# Patient Record
Sex: Male | Born: 1935 | Race: White | Hispanic: No | Marital: Married | Smoking: Never smoker
Health system: Southern US, Community
[De-identification: ages and names within clinical notes are randomized; demographics above are authoritative.]

## PROBLEM LIST (undated history)

## (undated) DIAGNOSIS — R4701 Aphasia: Principal | ICD-10-CM

## (undated) DIAGNOSIS — Z8719 Personal history of other diseases of the digestive system: Secondary | ICD-10-CM

## (undated) DIAGNOSIS — H269 Unspecified cataract: Secondary | ICD-10-CM

## (undated) DIAGNOSIS — B351 Tinea unguium: Secondary | ICD-10-CM

## (undated) DIAGNOSIS — L719 Rosacea, unspecified: Secondary | ICD-10-CM

## (undated) DIAGNOSIS — K5790 Diverticulosis of intestine, part unspecified, without perforation or abscess without bleeding: Secondary | ICD-10-CM

## (undated) DIAGNOSIS — A4151 Sepsis due to Escherichia coli [E. coli]: Secondary | ICD-10-CM

## (undated) DIAGNOSIS — S0500XA Injury of conjunctiva and corneal abrasion without foreign body, unspecified eye, initial encounter: Secondary | ICD-10-CM

## (undated) DIAGNOSIS — T7840XA Allergy, unspecified, initial encounter: Secondary | ICD-10-CM

## (undated) DIAGNOSIS — G459 Transient cerebral ischemic attack, unspecified: Secondary | ICD-10-CM

## (undated) DIAGNOSIS — R040 Epistaxis: Secondary | ICD-10-CM

## (undated) DIAGNOSIS — N400 Enlarged prostate without lower urinary tract symptoms: Secondary | ICD-10-CM

## (undated) DIAGNOSIS — I10 Essential (primary) hypertension: Secondary | ICD-10-CM

## (undated) DIAGNOSIS — H409 Unspecified glaucoma: Secondary | ICD-10-CM

## (undated) DIAGNOSIS — E785 Hyperlipidemia, unspecified: Secondary | ICD-10-CM

## (undated) HISTORY — PX: TONSILLECTOMY AND ADENOIDECTOMY: SUR1326

## (undated) HISTORY — DX: Unspecified cataract: H26.9

## (undated) HISTORY — DX: Injury of conjunctiva and corneal abrasion without foreign body, unspecified eye, initial encounter: S05.00XA

## (undated) HISTORY — DX: Benign prostatic hyperplasia without lower urinary tract symptoms: N40.0

## (undated) HISTORY — DX: Rosacea, unspecified: L71.9

## (undated) HISTORY — DX: Essential (primary) hypertension: I10

## (undated) HISTORY — DX: Hyperlipidemia, unspecified: E78.5

## (undated) HISTORY — DX: Allergy, unspecified, initial encounter: T78.40XA

## (undated) HISTORY — DX: Unspecified glaucoma: H40.9

## (undated) HISTORY — DX: Diverticulosis of intestine, part unspecified, without perforation or abscess without bleeding: K57.90

## (undated) HISTORY — DX: Transient cerebral ischemic attack, unspecified: G45.9

## (undated) HISTORY — DX: Aphasia: R47.01

## (undated) HISTORY — PX: INGUINAL HERNIA REPAIR: SHX194

## (undated) HISTORY — PX: HERNIA REPAIR: SHX51

## (undated) HISTORY — DX: Personal history of other diseases of the digestive system: Z87.19

## (undated) HISTORY — PX: APPENDECTOMY: SHX54

## (undated) HISTORY — DX: Tinea unguium: B35.1

## (undated) HISTORY — DX: Sepsis due to Escherichia coli (e. coli): A41.51

---

## 1976-11-23 HISTORY — PX: CHOLECYSTECTOMY: SHX55

## 2001-09-04 ENCOUNTER — Emergency Department (HOSPITAL_COMMUNITY): Admission: EM | Admit: 2001-09-04 | Discharge: 2001-09-04 | Payer: Self-pay | Admitting: Emergency Medicine

## 2001-11-23 HISTORY — PX: CATARACT EXTRACTION, BILATERAL: SHX1313

## 2002-01-27 ENCOUNTER — Encounter: Payer: Self-pay | Admitting: Ophthalmology

## 2002-01-31 ENCOUNTER — Ambulatory Visit (HOSPITAL_COMMUNITY): Admission: RE | Admit: 2002-01-31 | Discharge: 2002-01-31 | Payer: Self-pay | Admitting: Ophthalmology

## 2002-04-11 ENCOUNTER — Ambulatory Visit (HOSPITAL_COMMUNITY): Admission: RE | Admit: 2002-04-11 | Discharge: 2002-04-11 | Payer: Self-pay | Admitting: Ophthalmology

## 2004-02-18 ENCOUNTER — Encounter: Payer: Self-pay | Admitting: Internal Medicine

## 2004-12-17 ENCOUNTER — Ambulatory Visit: Payer: Self-pay | Admitting: Internal Medicine

## 2004-12-22 ENCOUNTER — Ambulatory Visit: Payer: Self-pay | Admitting: Internal Medicine

## 2005-02-16 ENCOUNTER — Ambulatory Visit: Payer: Self-pay | Admitting: Internal Medicine

## 2005-02-20 ENCOUNTER — Ambulatory Visit: Payer: Self-pay | Admitting: Internal Medicine

## 2005-06-22 ENCOUNTER — Ambulatory Visit: Payer: Self-pay | Admitting: Internal Medicine

## 2005-07-31 ENCOUNTER — Ambulatory Visit: Payer: Self-pay | Admitting: Internal Medicine

## 2006-04-01 ENCOUNTER — Ambulatory Visit: Payer: Self-pay | Admitting: Internal Medicine

## 2006-04-09 ENCOUNTER — Ambulatory Visit: Payer: Self-pay | Admitting: Internal Medicine

## 2006-05-12 ENCOUNTER — Ambulatory Visit: Payer: Self-pay | Admitting: Internal Medicine

## 2006-06-14 ENCOUNTER — Ambulatory Visit: Payer: Self-pay | Admitting: Internal Medicine

## 2006-12-10 ENCOUNTER — Ambulatory Visit: Payer: Self-pay | Admitting: Internal Medicine

## 2007-04-26 ENCOUNTER — Telehealth (INDEPENDENT_AMBULATORY_CARE_PROVIDER_SITE_OTHER): Payer: Self-pay | Admitting: *Deleted

## 2007-05-09 ENCOUNTER — Ambulatory Visit: Payer: Self-pay | Admitting: Internal Medicine

## 2007-05-12 DIAGNOSIS — L719 Rosacea, unspecified: Secondary | ICD-10-CM

## 2007-05-12 DIAGNOSIS — H269 Unspecified cataract: Secondary | ICD-10-CM

## 2007-05-13 LAB — CONVERTED CEMR LAB
AST: 23 units/L (ref 0–37)
BUN: 11 mg/dL (ref 6–23)
Hgb A1c MFr Bld: 5.7 % (ref 4.6–6.0)
VLDL: 23 mg/dL (ref 0–40)

## 2007-05-16 ENCOUNTER — Ambulatory Visit: Payer: Self-pay | Admitting: Internal Medicine

## 2007-05-16 DIAGNOSIS — N400 Enlarged prostate without lower urinary tract symptoms: Secondary | ICD-10-CM

## 2007-09-01 ENCOUNTER — Ambulatory Visit: Payer: Self-pay | Admitting: Internal Medicine

## 2007-09-01 DIAGNOSIS — H902 Conductive hearing loss, unspecified: Secondary | ICD-10-CM | POA: Insufficient documentation

## 2007-09-02 ENCOUNTER — Encounter (INDEPENDENT_AMBULATORY_CARE_PROVIDER_SITE_OTHER): Payer: Self-pay | Admitting: *Deleted

## 2007-09-02 LAB — CONVERTED CEMR LAB: Total CK: 82 units/L (ref 7–195)

## 2007-10-11 ENCOUNTER — Ambulatory Visit: Payer: Self-pay | Admitting: Internal Medicine

## 2007-10-11 DIAGNOSIS — M25519 Pain in unspecified shoulder: Secondary | ICD-10-CM

## 2007-10-11 DIAGNOSIS — B351 Tinea unguium: Secondary | ICD-10-CM | POA: Insufficient documentation

## 2007-11-04 ENCOUNTER — Ambulatory Visit: Payer: Self-pay | Admitting: Internal Medicine

## 2007-11-04 DIAGNOSIS — E785 Hyperlipidemia, unspecified: Secondary | ICD-10-CM

## 2007-11-04 DIAGNOSIS — D179 Benign lipomatous neoplasm, unspecified: Secondary | ICD-10-CM | POA: Insufficient documentation

## 2008-01-16 ENCOUNTER — Telehealth: Payer: Self-pay | Admitting: Internal Medicine

## 2008-01-16 ENCOUNTER — Emergency Department (HOSPITAL_COMMUNITY): Admission: EM | Admit: 2008-01-16 | Discharge: 2008-01-17 | Payer: Self-pay | Admitting: Emergency Medicine

## 2008-01-25 ENCOUNTER — Telehealth (INDEPENDENT_AMBULATORY_CARE_PROVIDER_SITE_OTHER): Payer: Self-pay | Admitting: *Deleted

## 2008-01-26 ENCOUNTER — Ambulatory Visit: Payer: Self-pay | Admitting: Internal Medicine

## 2008-01-26 DIAGNOSIS — Z8719 Personal history of other diseases of the digestive system: Secondary | ICD-10-CM | POA: Insufficient documentation

## 2008-01-29 LAB — CONVERTED CEMR LAB
Albumin: 4 g/dL (ref 3.5–5.2)
BUN: 11 mg/dL (ref 6–23)
Basophils Relative: 0.7 % (ref 0.0–1.0)
Hemoglobin: 14.6 g/dL (ref 13.0–17.0)
Lymphocytes Relative: 22.3 % (ref 12.0–46.0)
Monocytes Absolute: 0.6 10*3/uL (ref 0.2–0.7)
Monocytes Relative: 9 % (ref 3.0–11.0)
Neutro Abs: 4.8 10*3/uL (ref 1.4–7.7)
TSH: 1.5 microintl units/mL (ref 0.35–5.50)
Total Bilirubin: 0.6 mg/dL (ref 0.3–1.2)
Total CK: 126 units/L (ref 7–195)
Total Protein: 7 g/dL (ref 6.0–8.3)

## 2008-01-30 ENCOUNTER — Encounter (INDEPENDENT_AMBULATORY_CARE_PROVIDER_SITE_OTHER): Payer: Self-pay | Admitting: *Deleted

## 2008-02-17 ENCOUNTER — Encounter (INDEPENDENT_AMBULATORY_CARE_PROVIDER_SITE_OTHER): Payer: Self-pay | Admitting: *Deleted

## 2008-02-17 ENCOUNTER — Ambulatory Visit: Payer: Self-pay | Admitting: Internal Medicine

## 2008-04-10 ENCOUNTER — Ambulatory Visit: Payer: Self-pay | Admitting: Internal Medicine

## 2008-04-10 ENCOUNTER — Telehealth (INDEPENDENT_AMBULATORY_CARE_PROVIDER_SITE_OTHER): Payer: Self-pay | Admitting: *Deleted

## 2008-06-06 ENCOUNTER — Telehealth (INDEPENDENT_AMBULATORY_CARE_PROVIDER_SITE_OTHER): Payer: Self-pay | Admitting: *Deleted

## 2008-10-16 ENCOUNTER — Ambulatory Visit: Payer: Self-pay | Admitting: Internal Medicine

## 2008-10-16 DIAGNOSIS — R7309 Other abnormal glucose: Secondary | ICD-10-CM

## 2008-10-16 DIAGNOSIS — J309 Allergic rhinitis, unspecified: Secondary | ICD-10-CM | POA: Insufficient documentation

## 2008-10-19 ENCOUNTER — Encounter (INDEPENDENT_AMBULATORY_CARE_PROVIDER_SITE_OTHER): Payer: Self-pay | Admitting: *Deleted

## 2008-12-12 ENCOUNTER — Ambulatory Visit: Payer: Self-pay | Admitting: Internal Medicine

## 2008-12-12 ENCOUNTER — Encounter (INDEPENDENT_AMBULATORY_CARE_PROVIDER_SITE_OTHER): Payer: Self-pay | Admitting: *Deleted

## 2008-12-12 LAB — CONVERTED CEMR LAB
OCCULT 1: NEGATIVE
OCCULT 3: NEGATIVE

## 2009-05-17 ENCOUNTER — Ambulatory Visit: Payer: Self-pay | Admitting: Internal Medicine

## 2009-05-17 LAB — CONVERTED CEMR LAB: Rapid Strep: NEGATIVE

## 2009-06-23 HISTORY — PX: COLONOSCOPY W/ POLYPECTOMY: SHX1380

## 2009-06-28 ENCOUNTER — Emergency Department (HOSPITAL_COMMUNITY): Admission: EM | Admit: 2009-06-28 | Discharge: 2009-06-28 | Payer: Self-pay | Admitting: Emergency Medicine

## 2009-07-12 ENCOUNTER — Telehealth: Payer: Self-pay | Admitting: Internal Medicine

## 2009-07-12 ENCOUNTER — Inpatient Hospital Stay (HOSPITAL_COMMUNITY): Admission: EM | Admit: 2009-07-12 | Discharge: 2009-07-14 | Payer: Self-pay | Admitting: Emergency Medicine

## 2009-07-12 ENCOUNTER — Ambulatory Visit: Payer: Self-pay | Admitting: Gastroenterology

## 2009-07-12 ENCOUNTER — Ambulatory Visit: Payer: Self-pay | Admitting: Internal Medicine

## 2009-07-13 ENCOUNTER — Encounter: Payer: Self-pay | Admitting: Gastroenterology

## 2009-07-17 ENCOUNTER — Ambulatory Visit: Payer: Self-pay | Admitting: Internal Medicine

## 2009-07-17 DIAGNOSIS — Z860100 Personal history of colon polyps, unspecified: Secondary | ICD-10-CM | POA: Insufficient documentation

## 2009-07-17 DIAGNOSIS — K5730 Diverticulosis of large intestine without perforation or abscess without bleeding: Secondary | ICD-10-CM | POA: Insufficient documentation

## 2009-07-17 DIAGNOSIS — K921 Melena: Secondary | ICD-10-CM | POA: Insufficient documentation

## 2009-09-30 ENCOUNTER — Ambulatory Visit: Payer: Self-pay | Admitting: Internal Medicine

## 2009-10-31 ENCOUNTER — Ambulatory Visit: Payer: Self-pay | Admitting: Internal Medicine

## 2009-11-08 ENCOUNTER — Encounter (INDEPENDENT_AMBULATORY_CARE_PROVIDER_SITE_OTHER): Payer: Self-pay | Admitting: *Deleted

## 2010-06-17 ENCOUNTER — Ambulatory Visit: Payer: Self-pay | Admitting: Internal Medicine

## 2010-06-17 DIAGNOSIS — M79609 Pain in unspecified limb: Secondary | ICD-10-CM

## 2010-07-14 ENCOUNTER — Ambulatory Visit: Payer: Self-pay | Admitting: Internal Medicine

## 2010-07-14 DIAGNOSIS — R42 Dizziness and giddiness: Secondary | ICD-10-CM | POA: Insufficient documentation

## 2010-07-16 LAB — CONVERTED CEMR LAB
BUN: 13 mg/dL (ref 6–23)
Basophils Absolute: 0 10*3/uL (ref 0.0–0.1)
Creatinine, Ser: 0.7 mg/dL (ref 0.4–1.5)
Eosinophils Relative: 1.6 % (ref 0.0–5.0)
GFR calc non Af Amer: 119.4 mL/min (ref >60)
Glucose, Bld: 82 mg/dL (ref 70–99)
HCT: 42.7 % (ref 39.0–52.0)
Lymphs Abs: 1.9 10*3/uL (ref 0.7–4.0)
Monocytes Absolute: 0.6 10*3/uL (ref 0.1–1.0)
Monocytes Relative: 11 % (ref 3.0–12.0)
Neutrophils Relative %: 51.5 % (ref 43.0–77.0)
Platelets: 186 10*3/uL (ref 150.0–400.0)
Potassium: 4.4 meq/L (ref 3.5–5.1)
RDW: 13.2 % (ref 11.5–14.6)
WBC: 5.3 10*3/uL (ref 4.5–10.5)

## 2010-08-17 ENCOUNTER — Emergency Department (HOSPITAL_COMMUNITY)
Admission: EM | Admit: 2010-08-17 | Discharge: 2010-08-17 | Payer: Self-pay | Source: Home / Self Care | Admitting: Emergency Medicine

## 2010-08-18 ENCOUNTER — Telehealth: Payer: Self-pay | Admitting: Internal Medicine

## 2010-08-19 ENCOUNTER — Ambulatory Visit: Payer: Self-pay | Admitting: Internal Medicine

## 2010-08-19 DIAGNOSIS — E162 Hypoglycemia, unspecified: Secondary | ICD-10-CM | POA: Insufficient documentation

## 2010-08-20 LAB — CONVERTED CEMR LAB: Hgb A1c MFr Bld: 5.7 % (ref 4.6–6.5)

## 2010-08-22 ENCOUNTER — Ambulatory Visit: Payer: Self-pay | Admitting: Internal Medicine

## 2010-08-25 ENCOUNTER — Ambulatory Visit: Payer: Self-pay | Admitting: Internal Medicine

## 2010-08-25 ENCOUNTER — Encounter: Payer: Self-pay | Admitting: Internal Medicine

## 2010-08-25 DIAGNOSIS — K430 Incisional hernia with obstruction, without gangrene: Secondary | ICD-10-CM

## 2010-09-01 ENCOUNTER — Encounter: Payer: Self-pay | Admitting: Internal Medicine

## 2010-09-05 ENCOUNTER — Encounter: Payer: Self-pay | Admitting: Internal Medicine

## 2010-09-09 ENCOUNTER — Encounter: Payer: Self-pay | Admitting: Internal Medicine

## 2010-09-10 ENCOUNTER — Ambulatory Visit: Payer: Self-pay

## 2010-09-10 ENCOUNTER — Ambulatory Visit (HOSPITAL_COMMUNITY): Admission: RE | Admit: 2010-09-10 | Discharge: 2010-09-10 | Payer: Self-pay | Admitting: Internal Medicine

## 2010-09-10 ENCOUNTER — Ambulatory Visit: Payer: Self-pay | Admitting: Cardiology

## 2010-09-10 ENCOUNTER — Encounter: Payer: Self-pay | Admitting: Internal Medicine

## 2010-12-11 ENCOUNTER — Telehealth: Payer: Self-pay | Admitting: Internal Medicine

## 2010-12-15 ENCOUNTER — Telehealth (INDEPENDENT_AMBULATORY_CARE_PROVIDER_SITE_OTHER): Payer: Self-pay | Admitting: *Deleted

## 2010-12-16 ENCOUNTER — Ambulatory Visit
Admission: RE | Admit: 2010-12-16 | Discharge: 2010-12-16 | Payer: Self-pay | Source: Home / Self Care | Attending: Internal Medicine | Admitting: Internal Medicine

## 2010-12-16 ENCOUNTER — Other Ambulatory Visit: Payer: Self-pay | Admitting: Internal Medicine

## 2010-12-16 LAB — HEPATIC FUNCTION PANEL
ALT: 20 U/L (ref 0–53)
AST: 24 U/L (ref 0–37)
Alkaline Phosphatase: 37 U/L — ABNORMAL LOW (ref 39–117)
Bilirubin, Direct: 0.1 mg/dL (ref 0.0–0.3)
Total Protein: 6.5 g/dL (ref 6.0–8.3)

## 2010-12-16 LAB — LIPID PANEL: Cholesterol: 146 mg/dL (ref 0–200)

## 2010-12-21 LAB — CONVERTED CEMR LAB
ALT: 21 units/L (ref 0–53)
AST: 28 units/L (ref 0–37)
Albumin: 4.1 g/dL (ref 3.5–5.2)
Alkaline Phosphatase: 33 units/L — ABNORMAL LOW (ref 39–117)
Basophils Absolute: 0 10*3/uL (ref 0.0–0.1)
Basophils Relative: 0.5 % (ref 0.0–3.0)
Bilirubin, Direct: 0.1 mg/dL (ref 0.0–0.3)
Calcium: 9.2 mg/dL (ref 8.4–10.5)
Calcium: 9.3 mg/dL (ref 8.4–10.5)
Cholesterol, target level: 200 mg/dL
Cholesterol: 125 mg/dL (ref 0–200)
Creatinine, Ser: 0.7 mg/dL (ref 0.4–1.5)
Eosinophils Relative: 2.2 % (ref 0.0–5.0)
GFR calc Af Amer: 141 mL/min
GFR calc non Af Amer: 115.69 mL/min (ref >60)
Glucose, Bld: 99 mg/dL (ref 70–99)
HCT: 43.3 % (ref 39.0–52.0)
HCT: 43.6 % (ref 39.0–52.0)
HDL goal, serum: 40 mg/dL
Hemoglobin: 14.9 g/dL (ref 13.0–17.0)
Hemoglobin: 15.3 g/dL (ref 13.0–17.0)
Hgb A1c MFr Bld: 5.7 % (ref 4.6–6.0)
LDL Goal: 110 mg/dL
Lymphocytes Relative: 26.8 % (ref 12.0–46.0)
Lymphs Abs: 1.4 10*3/uL (ref 0.7–4.0)
MCHC: 35.4 g/dL (ref 30.0–36.0)
MCV: 96.8 fL (ref 78.0–100.0)
Monocytes Absolute: 0.4 10*3/uL (ref 0.1–1.0)
Monocytes Relative: 6.5 % (ref 3.0–12.0)
Neutro Abs: 3.3 10*3/uL (ref 1.4–7.7)
Neutro Abs: 4.2 10*3/uL (ref 1.4–7.7)
Potassium: 4.4 meq/L (ref 3.5–5.1)
RBC: 4.36 M/uL (ref 4.22–5.81)
RDW: 12.3 % (ref 11.5–14.6)
RDW: 12.3 % (ref 11.5–14.6)
Sodium: 137 meq/L (ref 135–145)
Sodium: 139 meq/L (ref 135–145)
TSH: 1.73 microintl units/mL (ref 0.35–5.50)
TSH: 1.8 microintl units/mL (ref 0.35–5.50)
Total Bilirubin: 0.7 mg/dL (ref 0.3–1.2)
Total CHOL/HDL Ratio: 3
Total Protein: 7 g/dL (ref 6.0–8.3)
Total Protein: 7.1 g/dL (ref 6.0–8.3)
Triglycerides: 73 mg/dL (ref 0–149)
VLDL: 11.6 mg/dL (ref 0.0–40.0)
WBC: 5.1 10*3/uL (ref 4.5–10.5)

## 2010-12-25 NOTE — Miscellaneous (Signed)
Summary: Appointment Canceled  Appointment status changed to canceled by [employer] on 08/25/2010 9:43 AM.  Cancellation Comments --------------------- echo/dizziness  Appointment Information ----------------------- Appt Type:  CARDIOLOGY ANCILLARY VISIT      Date:  Tuesday, September 02, 2010      Time:  1:00 PM for 60 min   Urgency:  Routine   Made By:  Pearson Grippe  To Visit:  [id number]    Reason:  echo/dizziness  Appt Comments ------------- -- 08/25/10 9:43: ([facility]) CANCELED -- echo/dizziness -- 08/25/10 9:37: ([facility]) BOOKED -- Routine CARDIOLOGY ANCILLARY VISIT at 09/02/2010 1:00 PM for 60 min echo/dizziness

## 2010-12-25 NOTE — Assessment & Plan Note (Signed)
Summary: ELEVATED BP/CDJ   Vital Signs:  Patient profile:   77 year old male Weight:      167.6 pounds Temp:     98.5 degrees F oral Pulse rate:   72 / minute Resp:     16 per minute BP sitting:   126 / 70  (left arm) Cuff size:   large  Vitals Entered By: Shonna Chock CMA (August 19, 2010 1:43 PM) CC: B/P concerns   CC:  B/P concerns.  History of Present Illness: He was seen for" Vertigo" @ [place]  08/17/2010; ER note & imaging reviewed. Hyperglycemia while on IVs in ER.MRA revealed a small  L distal vertebral artery & dominant R vertebral artery. Rx: Meclizine with response. Verigo intermittent since 05/2010. Past history of symptoms while working above head with neck extended.Symptoms are  dizziness with visual changes ,ie  difficulty focusing. No frank vertigo or BPV symptoms. Last Ophthalmology exam 3 months ago.Symptoms of reactive hypoglycemia as weakness if protein not taken with b'fast recently.  Allergies: 1)  ! Codeine 2)  ! * Ether 3)  ! Minocycline Hcl (Minocycline Hcl)  Review of Systems Neuro:  Denies brief paralysis, disturbances in coordination, numbness, sensation of room spinning, and weakness; Mild tingling in arms 09/25 for  30 seconds..  Physical Exam  General:  in no acute distress; alert,appropriate and cooperative throughout examination Eyes:  No corneal or conjunctival inflammation noted. EOMI. Perrla. Field of Vision grossly normal. Heart:  Normal rate and regular rhythm. S1 and S2 normal without gallop, murmur, click, rub. S4 Pulses:  R and L carotid,radial pulses are full and equal bilaterally Neurologic:  alert & oriented X3, cranial nerves II-XII intact, strength normal in all extremities, gait normal, DTRs symmetrical and normal, finger-to-nose normal, and Romberg negative.   Skin:  Intact without suspicious lesions or rashes Psych:  memory intact for recent and remote, normally interactive, good eye contact, and not anxious appearing.      Impression & Recommendations:  Problem # 1:  DIZZINESS (ICD-780.4)  with vision disturbance; small L vertebral artery on MRA His updated medication list for this problem includes:    Meclizine Hcl 25 Mg Tabs (Meclizine hcl) .Marland Kitchen... 1 by mouth every 6 hours as needed  Orders: Neurology Referral (Neuro)  Problem # 2:  HYPERGLYCEMIA (ICD-790.29)  Orders: Venipuncture ([address]) TLB-A1C / Hgb A1C (Glycohemoglobin) (83036-A1C) Specimen Handling ([address])  Problem # 3:  REACTIVE HYPOGLYCEMIA (ICD-251.2)  Orders: Venipuncture (09811) TLB-A1C / Hgb A1C (Glycohemoglobin) (83036-A1C) Specimen Handling (91478)  Complete Medication List: 1)  Vytorin 10-20 Mg Tabs (Ezetimibe-simvastatin) .... 1/2 once daily 2)  Nystatin-triamcinolone Crea (Nystatin-triamcinolone crea) .... Prn 3)  Saw Palmetto Caps (Saw palmetto (serenoa repens) caps) .... Once daily 4)  Istalol 0.5 % Soln (Timolol maleate) .Marland Kitchen.. 1 gtt qd 5)  Basa  6)  Centrum Silver  7)  Vit D 1000mg  Qd  8)  Glucosamine  .... Once daily 9)  Meclizine Hcl 25 Mg Tabs (Meclizine hcl) .Marland Kitchen.. 1 by mouth every 6 hours as needed  Patient Instructions: 1)  Take 3 coated baby  Aspirin every day with b'fast Avoid neck extension or working above head..Add lean protein to b'fast such as [place] bacon or protein bar or powder.

## 2010-12-25 NOTE — Assessment & Plan Note (Signed)
Summary: pulled muscle in growing/kn   Vital Signs:  Patient profile:   77 year old male Weight:      167.4 pounds BMI:     27.32 Temp:     98.3 degrees F oral Pulse rate:   72 / minute Resp:     16 per minute BP sitting:   122 / 70  (left arm) Cuff size:   large  Vitals Entered By: Shonna Chock CMA (June 17, 2010 1:52 PM) CC: Pulled muscle moving furniture   CC:  Pulled muscle moving furniture.  History of Present Illness: Pain L medial  thigh X 2 weeks after moving items (encyclopedias) in house to paint; worse after sitting. No definite injury ; no "pop" felt or heard. No  limitation to ADLs or walking. NW:GNFAOZH helped  Current Medications (verified): 1)  Vytorin 10-20 Mg  Tabs (Ezetimibe-Simvastatin) .... 1/2 Once Daily 2)  Nystatin-Triamcinolone   Crea (Nystatin-Triamcinolone Crea) .... Prn 3)  Saw Palmetto   Caps (Saw Palmetto (Serenoa Repens) Caps) .... Once Daily 4)  Istalol 0.5 %  Soln (Timolol Maleate) .Marland Kitchen.. 1 Gtt Qd 5)  Basa 6)  Centrum Silver 7)  Vit D 1000mg  Qd 8)  Glucosamine .... Once Daily  Allergies: 1)  ! Codeine 2)  ! * Ether 3)  ! Minocycline Hcl (Minocycline Hcl)  Review of Systems General:  Denies chills, fever, and sweats. GI:  Denies abdominal pain and change in bowel habits. GU:  Denies discharge, dysuria, and hematuria. Derm:  Denies changes in color of skin, lesion(s), and rash.  Physical Exam  General:  in no acute distress; alert,appropriate and cooperative throughout examination Abdomen:  No inguinal  hernias noted. Genitalia:  Testes bilaterally descended without nodularity, tenderness or masses. No scrotal masses or lesions. No penis lesions or urethral discharge. Extremities:  Tender L medial thigh to palpation; full ROM Inguinal Nodes:  No significant adenopathy   Impression & Recommendations:  Problem # 1:  LEG PAIN, LEFT (ICD-729.5) soft tissue injury post lifting   Complete Medication List: 1)  Vytorin 10-20 Mg Tabs  (Ezetimibe-simvastatin) .... 1/2 once daily 2)  Nystatin-triamcinolone Crea (Nystatin-triamcinolone crea) .... Prn 3)  Saw Palmetto Caps (Saw palmetto (serenoa repens) caps) .... Once daily 4)  Istalol 0.5 % Soln (Timolol maleate) .Marland Kitchen.. 1 gtt qd 5)  Basa  6)  Centrum Silver  7)  Vit D 1000mg  Qd  8)  Glucosamine  .... Once daily  Patient Instructions: 1)  Water exercises doing frog kick & the 2 Nautilus machines for thigh exercise (max of 50 #) as discussed. Glucosamine sulfate 1500 mg once daily for 4-6 weeks. Physical Therapy if no better

## 2010-12-25 NOTE — Progress Notes (Signed)
Summary: Refill--Vytorin  Phone Note Refill Request Message from:  Fax from Pharmacy on December 11, 2010 11:53 AM  Refills Requested: Medication #1:  VYTORIN 10-20 MG  TABS 1/2 once daily Initial call taken by: Lucious Groves CMA,  December 11, 2010 11:53 AM    Prescriptions: VYTORIN 10-20 MG  TABS (EZETIMIBE-SIMVASTATIN) 1/2 once daily  #45 x 0   Entered by:   Lucious Groves CMA   Authorized by:   [name] MD   Signed by:   Lucious Groves CMA on 12/11/2010   Method used:   Faxed to ...       [facility] [facility])       [address] [place], [place]  [address]       Ph: [phone]       Fax: [phone]   RxID:   [id number]

## 2010-12-25 NOTE — Consult Note (Signed)
Summary: [facility]  [facility]   Imported By: Lanelle Bal 09/09/2010 16:24:09  _____________________________________________________________________  External Attachment:    Type:   Image     Comment:   External Document

## 2010-12-25 NOTE — Progress Notes (Signed)
Summary: fyi-vertigo  Phone Note Call from Patient   Summary of Call: [facility] Triage Call Report Triage Record Num: [id number] Operator: Geanie Berlin Patient Name: Jack Noble Call Date & Time: 08/17/2010 12:23:57PM Patient Phone: [phone] PCP: [name] Patient Gender: Male PCP Fax : [phone] Patient DOB: 08-14-1929 Practice Name: [facility] Reason for Call: Wife/Margaret called re dizziness, skin cold/clammy and lightheaded. BP 182/88. Advised to call 911 now for s/s that may indicate shock per Dizziness or Vertigo Guideline. Protocol(s) Used: Dizziness or Vertigo Recommended Outcome per Protocol: Activate EMS 911 Reason for Outcome: New or worsening signs and symptoms that may indicate shock Care Advice:  ~ Do not give the patient anything to eat or drink.  ~ An adult should stay with the patient, preferably one trained in CPR. Lay the person down and elevate legs at least 12 inches (30 cm) above level of heart. Cover to help maintain body temperature.  ~  ~ IMMEDIATE ACTION Write down provider's name. List or place the following in a bag for transport with the patient: current prescription and/or nonprescription medications; alternative treatments, therapies and medications; and street drugs.  ~  Follow-up for Phone Call        spoke w/ patient says that he is doing well will call if symptoms return and no better hasn't check bp today but will call if still elevated...Marland KitchenMarland KitchenDoristine Devoid CMA  August 18, 2010 12:19 PM   patient called back he checked bp at fire dept was still elevated at 168/81 office visit scheduled to see Hop 08/19/10.........Marland KitchenDoristine Devoid CMA  August 18, 2010 4:13 PM  Follow-up by: [name] MD,  August 18, 2010 4:51 PM

## 2010-12-25 NOTE — Assessment & Plan Note (Signed)
Summary: TIRED AND WEAK AND DIZZINESS//PH   Vital Signs:  Patient profile:   77 year old male Weight:      167 pounds BMI:     27.26 Temp:     98.6 degrees F oral Pulse rate:   69 / minute Resp:     17 per minute BP supine:   118 / 80  (left arm) BP sitting:   122 / 80  (left arm) BP standing:   128 / 82  (left arm) Cuff size:   large  Vitals Entered By: Shonna Chock CMA (July 14, 2010 11:59 AM) CC: Tired, weak, fatigue, and light headed x 3 days, Syncope Comments Pulse Supine: 65 Pulse Standing: 67   CC:  Tired, weak, fatigue, and light headed x 3 days, and Syncope.  History of Present Illness:      This is a 77 year old man who presents with lightedness X 2-3 days intermittently.  The patient reports lightheadedness, but denies loss of consciousness, premonitory symptoms, near loss of consciousness, palpitations, chest pain, shortness of breath, and incontinence.  The patient denies the following symptoms: headache, abdominal discomfort, nausea, vomiting, feeling warm, pallor, diaphoresis, focal weakness, blurred vision, perioral numbness, and witnessed confusion . The patient reports the following precipitating factors: change in position,specifically  waist flexion & turning rapidly. The  lightheadedness. No BPV or true vertigo. He has been under stress @ church due to change in persnnel. PMH of dehydration; no recurrence even with recent heat wave.PMH of syncope after working with arms  extended above head & neck hyperextended.  Allergies: 1)  ! Codeine 2)  ! * Ether 3)  ! 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 ENT:  Denies nasal congestion; No facial pain, frontal headache, or purulence. GI:  Denies bloody stools and dark tarry stools. Psych:  Complains of anxiety; denies depression, easily angered, easily tearful, and irritability.  Physical Exam  General:  well-nourished,in no acute distress; alert,appropriate and cooperative throughout examination Eyes:  No corneal or conjunctival inflammation noted. EOMI. Perrla. Field  Vision grossly normal.Slight ptosis bilaterally Ears:  External ear exam shows no significant lesions or deformities.  Otoscopic examination reveals clear canals, tympanic membranes are intact bilaterally without bulging, retraction, inflammation or discharge. Hearing is grossly normal bilaterally. Nose:  External nasal examination shows no deformity or inflammation. Nasal mucosa are pink and moist without lesions or exudates. Mouth:  Oral mucosa and oropharynx without lesions or exudates.  Teeth in good repair. Heart:  Normal rate and regular rhythm. S1 and S2 normal without gallop, murmur, click, rub or other extra sounds. Pulses:  R and L carotid,radial  pulses are full and equal bilaterally. No carotid bruits Neurologic:  alert & oriented X3, cranial nerves II-XII intact, strength normal in all extremities, sensation intact to light touch, gait normal, DTRs symmetrical and normal, finger-to-nose normal, and Romberg negative.   Skin:  Intact without suspicious lesions or rashes Cervical Nodes:  No lymphadenopathy noted Axillary Nodes:  No palpable lymphadenopathy Psych:  memory intact for recent and remote, normally interactive, good eye contact, not anxious appearing, and not depressed appearing.  Impression & Recommendations:  Problem # 1:  DIZZINESS (ICD-780.4)  Orders: TLB-BMP (Basic Metabolic Panel-BMET) (80048-METABOL) TLB-CBC Platelet - w/Differential  (85025-CBCD)  Complete Medication List: 1)  Vytorin 10-20 Mg Tabs (Ezetimibe-simvastatin) .... 1/2 once daily 2)  Nystatin-triamcinolone Crea (Nystatin-triamcinolone crea) .... Prn 3)  Saw Palmetto Caps (Saw palmetto (serenoa repens) caps) .... Once daily 4)  Istalol 0.5 % Soln (Timolol maleate) .Marland Kitchen.. 1 gtt qd 5)  Basa  6)  Centrum Silver  7)  Vit D 1000mg  Qd  8)  Glucosamine  .... Once daily  Patient Instructions: 1)  Isometric exercises pre standing as discussed. Monitor for triggers for symptoms. "Think & Pray" exercise  over next week as discussed.  Appended Document: TIRED AND WEAK AND DIZZINESS//PH

## 2010-12-25 NOTE — Progress Notes (Signed)
Summary: RX  Phone Note Refill Request Call back at Home Phone [phone] Message from:  Patient on December 15, 2010 9:03 AM  Refills Requested: Medication #1:  VYTORIN 10-20 MG  TABS 1/2 once daily [facility] [address] AND A NEW SCRIPT TO FOR 3 MONTH  TO BE PICK UP.  Initial call taken by: Freddy Jaksch,  December 15, 2010 9:03 AM  Follow-up for Phone Call        Spoke with patient, scheduled appointment for tomorrow to check Lipids, last chlosterol check 2010. Samples placed up front for pick-up until labs back and advised on, then refills to be given Follow-up by: Shonna Chock CMA,  December 15, 2010 2:29 PM

## 2010-12-25 NOTE — Assessment & Plan Note (Signed)
Summary: concern about dizziness /cbs   Vital Signs:  Patient profile:   77 year old male Weight:      167 pounds BMI:     27.26 Temp:     98.5 degrees F oral Pulse (ortho):   85 / minute Resp:     16 per minute BP sitting:   132 / 78  Vitals Entered By: Shonna Chock CMA (August 22, 2010 3:11 PM) CC: Dizziness and Neuro appointment concerns   Serial Vital Signs/Assessments:  Time      Position  BP       Pulse  Resp  Temp     By 3:11 PM   Lying LA  126/80   75                    Chrae Malloy CMA 3:11 PM   Sitting   132/78   75                    Chrae Malloy CMA 3:11 PM   Standing  128/80   85                    Chrae Malloy CMA   CC:  Dizziness and Neuro appointment concerns.  History of Present Illness: Dizziness has occurred twice this week while sitting @ table; symptoms improved within 15 min of taking Meclizine. Episodes of dizziness prior to Meclizine lasted 15-30 min. He is now on 3 baby ASA. No cardiac prodrome to episodes.  Current Medications (verified): 1)  Vytorin 10-20 Mg  Tabs (Ezetimibe-Simvastatin) .... 1/2 Once Daily 2)  Nystatin-Triamcinolone   Crea (Nystatin-Triamcinolone Crea) .... Prn 3)  Saw Palmetto   Caps (Saw Palmetto (Serenoa Repens) Caps) .... Once Daily 4)  Istalol 0.5 %  Soln (Timolol Maleate) .Marland Kitchen.. 1 Gtt Qd 5)  Basa 6)  Centrum Silver 7)  Vit D 1000mg  Qd 8)  Glucosamine .... Once Daily 9)  Meclizine Hcl 25 Mg Tabs (Meclizine Hcl) .Marland Kitchen.. 1 By Mouth Every 6 Hours As Needed  Allergies: 1)  ! Codeine 2)  ! * Ether 3)  ! Minocycline Hcl (Minocycline Hcl)  Review of Systems ENT:  Denies earache and ringing in ears.  Physical Exam  General:  in no acute distress; alert,appropriate and cooperative throughout examination Lungs:  Normal respiratory effort, chest expands symmetrically. Lungs are clear to auscultation, no crackles or wheezes. Heart:  Normal rate and regular rhythm. S1 and S2 normal without gallop, murmur, click, rub .S 4. BP   126/80 supine; 132/78 sitting & 128/80 standing Neurologic:  alert & oriented X3, strength normal in all extremities, and DTRs symmetrical and normal.     Impression & Recommendations:  Problem # 1:  DIZZINESS (ICD-780.4)  R/O TIA His updated medication list for this problem includes:    Meclizine Hcl 25 Mg Tabs (Meclizine hcl) .Marland Kitchen... 1 by mouth every 6 hours as needed  Orders: Doppler Referral (Doppler) Echo Referral (Echo)  Complete Medication List: 1)  Vytorin 10-20 Mg Tabs (Ezetimibe-simvastatin) .... 1/2 once daily 2)  Nystatin-triamcinolone Crea (Nystatin-triamcinolone crea) .... Prn 3)  Saw Palmetto Caps (Saw palmetto (serenoa repens) caps) .... Once daily 4)  Istalol 0.5 % Soln (Timolol maleate) .Marland Kitchen.. 1 gtt qd 5)  Basa  6)  Centrum Silver  7)  Vit D 1000mg  Qd  8)  Glucosamine  .... Once daily 9)  Meclizine Hcl 25 Mg Tabs (Meclizine hcl) .Marland Kitchen.. 1 by mouth every 6 hours as  needed 10)  Diazepam 2 Mg Tabs (Diazepam) .Marland Kitchen.. 1 every 8-12 hrs as needed  Patient Instructions: 1)  Keep a Diary of events. Take Valium if dizziness is recurrent. Prescriptions: DIAZEPAM 2 MG TABS (DIAZEPAM) 1 every 8-12 hrs as needed  #30 x 0   Entered and Authorized by:   [name] MD   Signed by:   [name] MD on 08/22/2010   Method used:   Print then Give to Patient   RxID:   [id number]

## 2010-12-25 NOTE — Assessment & Plan Note (Signed)
Summary: HERNIA CAUSING PAIN/RH......   Vital Signs:  Patient profile:   77 year old male Weight:      165 pounds Temp:     98.2 degrees F oral Pulse rate:   60 / minute Resp:     15 per minute BP sitting:   130 / 72  (left arm) Cuff size:   large  Vitals Entered By: Shonna Chock CMA (August 25, 2010 4:26 PM) CC: Pain related to hernia   CC:  Pain related to hernia.  History of Present Illness: "Fullness " in mid abdomen with anorexia & tenderness & near nausea for "couple of weeks"  but worse 10/02. Symptoms began after  moving books in 05/2010 & worse adjusting position on hospital  ER bed  09/25. Normal BM today.  Current Medications (verified): 1)  Vytorin 10-20 Mg  Tabs (Ezetimibe-Simvastatin) .... 1/2 Once Daily 2)  Nystatin-Triamcinolone   Crea (Nystatin-Triamcinolone Crea) .... Prn 3)  Saw Palmetto   Caps (Saw Palmetto (Serenoa Repens) Caps) .... Once Daily 4)  Istalol 0.5 %  Soln (Timolol Maleate) .Marland Kitchen.. 1 Gtt Qd 5)  Basa 6)  Centrum Silver 7)  Vit D 1000mg  Qd 8)  Glucosamine .... Once Daily 9)  Meclizine Hcl 25 Mg Tabs (Meclizine Hcl) .Marland Kitchen.. 1 By Mouth Every 6 Hours As Needed 10)  Diazepam 2 Mg Tabs (Diazepam) .Marland Kitchen.. 1 Every 8-12 Hrs As Needed  Allergies: 1)  ! Codeine 2)  ! * Ether 3)  ! Minocycline Hcl (Minocycline Hcl)  Review of Systems General:  Denies chills, fever, sweats, and weight loss. GI:  Denies bloody stools, change in bowel habits, constipation, dark tarry stools, and diarrhea. GU:  Denies discharge, dysuria, and hematuria.  Physical Exam  General:  in no acute distress; alert,appropriate and cooperative throughout examination Eyes:  No corneal or conjunctival inflammation noted.No icterus Abdomen:  Bowel sounds decreased,abdomen soft and   minimally tender over  9.45 X 8 cm ventral hernia. No organomegaly  noted. Skin:  No jaundice Cervical Nodes:  No lymphadenopathy noted Axillary Nodes:  No palpable lymphadenopathy Psych:  memory intact for  recent and remote, normally interactive, and good eye contact.     Impression & Recommendations:  Problem # 1:  VENTRAL HERNIA (ICD-553.20)  Orders: Prescription Created Electronically [id number])  Complete Medication List: 1)  Vytorin 10-20 Mg Tabs (Ezetimibe-simvastatin) .... 1/2 once daily 2)  Nystatin-triamcinolone Crea (Nystatin-triamcinolone crea) .... Prn 3)  Saw Palmetto Caps (Saw palmetto (serenoa repens) caps) .... Once daily 4)  Istalol 0.5 % Soln (Timolol maleate) .Marland Kitchen.. 1 gtt qd 5)  Basa  6)  Centrum Silver  7)  Vit D 1000mg  Qd  8)  Glucosamine  .... Once daily 9)  Meclizine Hcl 25 Mg Tabs (Meclizine hcl) .Marland Kitchen.. 1 by mouth every 6 hours as needed 10)  Diazepam 2 Mg Tabs (Diazepam) .Marland Kitchen.. 1 every 8-12 hrs as needed 11)  Hyoscyamine Sulfate 0.125 Mg Subl (Hyoscyamine sulfate) .Marland Kitchen.. 1 under tongue every togue every 6 hrs as needed  Patient Instructions: 1)  Drink clear liquids only for the next 24 hours, then slowly add other liquids and food as you  tolerate them. To ER if abdominal pain progresses. Prescriptions: HYOSCYAMINE SULFATE 0.125 MG SUBL (HYOSCYAMINE SULFATE) 1 under tongue every togue every 6 hrs as needed  #20 x 0   Entered and Authorized by:   [name] MD   Signed by:   [name] MD on 08/25/2010   Method used:   Faxed  to .Marland KitchenMarland Kitchen       [facility] [address]       [address]       [place], [place]  [address]       Ph: [phone] or [phone]       Fax: [phone]   RxID:   [id number]

## 2010-12-25 NOTE — Miscellaneous (Signed)
Summary: Orders Update  Clinical Lists Changes  Orders: Added new Test order of Carotid Duplex (Carotid Duplex) - Signed 

## 2011-01-01 ENCOUNTER — Telehealth: Payer: Self-pay | Admitting: Internal Medicine

## 2011-01-08 NOTE — Progress Notes (Signed)
Summary: Vytorin  Phone Note Call from Patient   Summary of Call: Patient called about his lab results and made me aware that he has about 3 months of Vytorin at home. I made the patient aware that he could finish that so that it is not wasted, and call his insurance company to discuss formulary. He notes that he has a letter from them to bring to next ov, and him and his wife both need to come in, so he will talk with her about it and call back later for appt. Initial call taken by: Lucious Groves CMA,  January 01, 2011 3:26 PM

## 2011-02-03 ENCOUNTER — Ambulatory Visit (INDEPENDENT_AMBULATORY_CARE_PROVIDER_SITE_OTHER): Payer: MEDICARE | Admitting: Internal Medicine

## 2011-02-03 ENCOUNTER — Encounter: Payer: Self-pay | Admitting: Internal Medicine

## 2011-02-03 DIAGNOSIS — J019 Acute sinusitis, unspecified: Secondary | ICD-10-CM

## 2011-02-05 LAB — CBC
HCT: 41.7 % (ref 39.0–52.0)
Hemoglobin: 14.4 g/dL (ref 13.0–17.0)
RBC: 4.38 MIL/uL (ref 4.22–5.81)
WBC: 9.1 10*3/uL (ref 4.0–10.5)

## 2011-02-05 LAB — GLUCOSE, CAPILLARY: Glucose-Capillary: 133 mg/dL — ABNORMAL HIGH (ref 70–99)

## 2011-02-05 LAB — POCT CARDIAC MARKERS
CKMB, poc: <1 ng/mL — ABNORMAL LOW (ref 1.0–8.0)
Myoglobin, poc: 43.8 ng/mL (ref 12–200)
Troponin i, poc: <0.05 ng/mL (ref 0.00–0.09)

## 2011-02-05 LAB — BASIC METABOLIC PANEL
Chloride: 104 mEq/L (ref 96–112)
GFR calc non Af Amer: >60 mL/min (ref >60)
Glucose, Bld: 142 mg/dL — ABNORMAL HIGH (ref 70–99)

## 2011-02-10 NOTE — Assessment & Plan Note (Signed)
Summary: sinus problems, cough, hoarse, throat congestion///sph   Vital Signs:  Patient profile:   77 year old male Weight:      162 pounds BMI:     26.44 Temp:     99.2 degrees F oral Pulse rate:   84 / minute Resp:     14 per minute BP sitting:   136 / 80  (left arm) Cuff size:   large  Vitals Entered By: Shonna Chock CMA (February 03, 2011 3:16 PM) CC: Sinus, cough, hoarse and congestion , URI symptoms   CC:  Sinus, cough, hoarse and congestion , and URI symptoms.  History of Present Illness:    Onset 2 weeks ago as rhnitis with clear secretions; 3 days ago purulence began. He now  reports nasal congestion and sore throat, but denies earache.  The patient denies fever, vomiting, and diarrhea.  The patient denies headache.  Risk factors for Strep sinusitis include bilateral facial pain.  The patient denies the following risk factors for Strep sinusitis: tooth pain and tender adenopathy.  Rx: "spirits with lemon  & honey"  Current Medications (verified): 1)  Vytorin 10-20 Mg  Tabs (Ezetimibe-Simvastatin) .... 1/2 Once Daily 2)  Nystatin-Triamcinolone   Crea (Nystatin-Triamcinolone Crea) .... Prn 3)  Saw Palmetto   Caps (Saw Palmetto (Serenoa Repens) Caps) .... Once Daily 4)  Istalol 0.5 %  Soln (Timolol Maleate) .Marland Kitchen.. 1 Gtt Qd 5)  Aspirin 81 Mg Tabs (Aspirin) .... 2 By Mouth Once Daily 6)  Centrum Silver 7)  Vit D 1000mg  Qd  Allergies: 1)  ! Codeine 2)  ! * Ether 3)  ! Minocycline Hcl (Minocycline Hcl)  Physical Exam  General:  in no acute distress; alert,appropriate and cooperative throughout examination Ears:  External ear exam shows no significant lesions or deformities.  Otoscopic examination reveals clear canals, tympanic membranes are intact bilaterally without bulging, retraction, inflammation or discharge. Wax on R Nose:  External nasal examination shows no deformity or inflammation. Nasal mucosa are  slightly erythematous without lesions or exudates. Mouth:  Oral  mucosa and oropharynx without lesions or exudates.  Teeth in good repair. Lungs:  Normal respiratory effort, chest expands symmetrically. Lungs are clear to auscultation, no crackles or wheezes. Cervical Nodes:  No lymphadenopathy noted Axillary Nodes:  No palpable lymphadenopathy   Impression & Recommendations:  Problem # 1:  SINUSITIS- ACUTE-NOS (ICD-461.9)  His updated medication list for this problem includes:    Amoxicillin 500 Mg Caps (Amoxicillin) .Marland Kitchen... 1 three times a day  Orders: Prescription Created Electronically [phone])  Complete Medication List: 1)  Vytorin 10-20 Mg Tabs (Ezetimibe-simvastatin) .... 1/2 once daily 2)  Nystatin-triamcinolone Crea (Nystatin-triamcinolone crea) .... Prn 3)  Saw Palmetto Caps (Saw palmetto (serenoa repens) caps) .... Once daily 4)  Istalol 0.5 % Soln (Timolol maleate) .Marland Kitchen.. 1 gtt qd 5)  Aspirin 81 Mg Tabs (Aspirin) .... 2 by mouth once daily 6)  Centrum Silver  7)  Vit D 1000mg  Qd  8)  Amoxicillin 500 Mg Caps (Amoxicillin) .Marland Kitchen.. 1 three times a day  Patient Instructions: 1)  Drink as much  NON dairy fluid as you can tolerate for the next few days. Prescriptions: AMOXICILLIN 500 MG CAPS (AMOXICILLIN) 1 three times a day  #30 x 0   Entered and Authorized by:   [name] MD   Signed by:   [name] MD on 02/03/2011   Method used:   Electronically to        [facility]  [address] [phone][facility] (  retail)       [address]       [place], [place]  [address]       Ph: [phone] or [phone]       Fax: [phone]   RxID:   [id number]    Orders Added: 1)  Est. Patient Level III [59563] 2)  Prescription Created Electronically [phone]

## 2011-02-28 LAB — BASIC METABOLIC PANEL
BUN: 10 mg/dL (ref 6–23)
BUN: 9 mg/dL (ref 6–23)
Calcium: 9.4 mg/dL (ref 8.4–10.5)
Creatinine, Ser: 0.68 mg/dL (ref 0.4–1.5)
GFR calc non Af Amer: >60 mL/min (ref >60)
GFR calc non Af Amer: >60 mL/min (ref >60)
Glucose, Bld: 117 mg/dL — ABNORMAL HIGH (ref 70–99)
Potassium: 3.5 mEq/L (ref 3.5–5.1)
Sodium: 136 mEq/L (ref 135–145)

## 2011-02-28 LAB — CBC
HCT: 41.3 % (ref 39.0–52.0)
HCT: 43.5 % (ref 39.0–52.0)
HCT: 45.2 % (ref 39.0–52.0)
Hemoglobin: 15.3 g/dL (ref 13.0–17.0)
Platelets: 172 10*3/uL (ref 150–400)
Platelets: 178 10*3/uL (ref 150–400)
Platelets: 194 10*3/uL (ref 150–400)
RDW: 12.9 % (ref 11.5–15.5)
RDW: 13.2 % (ref 11.5–15.5)
WBC: 7.6 10*3/uL (ref 4.0–10.5)
WBC: 7.9 10*3/uL (ref 4.0–10.5)

## 2011-02-28 LAB — COMPREHENSIVE METABOLIC PANEL
Alkaline Phosphatase: 33 U/L — ABNORMAL LOW (ref 39–117)
BUN: 13 mg/dL (ref 6–23)
CO2: 29 mEq/L (ref 19–32)
Chloride: 100 mEq/L (ref 96–112)
GFR calc non Af Amer: >60 mL/min (ref >60)
Glucose, Bld: 152 mg/dL — ABNORMAL HIGH (ref 70–99)
Potassium: 3.6 mEq/L (ref 3.5–5.1)
Total Bilirubin: 0.7 mg/dL (ref 0.3–1.2)
Total Protein: 7.1 g/dL (ref 6.0–8.3)

## 2011-02-28 LAB — POCT I-STAT, CHEM 8
BUN: 11 mg/dL (ref 6–23)
Creatinine, Ser: 0.8 mg/dL (ref 0.4–1.5)
Glucose, Bld: 103 mg/dL — ABNORMAL HIGH (ref 70–99)
Hemoglobin: 16.3 g/dL (ref 13.0–17.0)
Potassium: 4.2 mEq/L (ref 3.5–5.1)
TCO2: 26 mmol/L (ref 0–100)

## 2011-02-28 LAB — PROTIME-INR
INR: 0.9 (ref 0.00–1.49)
Prothrombin Time: 12.5 seconds (ref 11.6–15.2)

## 2011-02-28 LAB — URINALYSIS, ROUTINE W REFLEX MICROSCOPIC
Bilirubin Urine: NEGATIVE
Ketones, ur: NEGATIVE mg/dL
Nitrite: NEGATIVE
Protein, ur: NEGATIVE mg/dL

## 2011-02-28 LAB — TYPE AND SCREEN
ABO/RH(D): A POS
Antibody Screen: NEGATIVE

## 2011-02-28 LAB — LACTIC ACID, PLASMA: Lactic Acid, Venous: 2.8 mmol/L — ABNORMAL HIGH (ref 0.5–2.2)

## 2011-02-28 LAB — DIFFERENTIAL
Basophils Absolute: 0 10*3/uL (ref 0.0–0.1)
Basophils Absolute: 0 10*3/uL (ref 0.0–0.1)
Basophils Relative: 0 % (ref 0–1)
Lymphocytes Relative: 19 % (ref 12–46)
Lymphs Abs: 1.4 10*3/uL (ref 0.7–4.0)
Neutro Abs: 12.3 10*3/uL — ABNORMAL HIGH (ref 1.7–7.7)
Neutrophils Relative %: 76 % (ref 43–77)
Neutrophils Relative %: 90 % — ABNORMAL HIGH (ref 43–77)

## 2011-02-28 LAB — HEMOCCULT GUIAC POC 1CARD (OFFICE): Fecal Occult Bld: POSITIVE

## 2011-03-27 ENCOUNTER — Encounter: Payer: Self-pay | Admitting: Internal Medicine

## 2011-03-27 ENCOUNTER — Ambulatory Visit (INDEPENDENT_AMBULATORY_CARE_PROVIDER_SITE_OTHER): Payer: MEDICARE | Admitting: Internal Medicine

## 2011-03-27 VITALS — BP 130/78 | HR 72 | Wt 161.0 lb

## 2011-03-27 DIAGNOSIS — J101 Influenza due to other identified influenza virus with other respiratory manifestations: Secondary | ICD-10-CM

## 2011-03-27 DIAGNOSIS — J111 Influenza due to unidentified influenza virus with other respiratory manifestations: Secondary | ICD-10-CM

## 2011-03-27 DIAGNOSIS — H409 Unspecified glaucoma: Secondary | ICD-10-CM | POA: Insufficient documentation

## 2011-03-27 DIAGNOSIS — Z87448 Personal history of other diseases of urinary system: Secondary | ICD-10-CM

## 2011-03-27 LAB — POCT URINALYSIS DIPSTICK
Blood, UA: NEGATIVE
Glucose, UA: NEGATIVE
Ketones, UA: NEGATIVE
Protein, UA: NEGATIVE
Spec Grav, UA: 1.01
Urobilinogen, UA: NEGATIVE

## 2011-03-27 NOTE — Patient Instructions (Signed)
Please consider the flu shot each fall; this is an attenuated virus which can not give you the Flu.

## 2011-03-27 NOTE — Progress Notes (Signed)
  Subjective:    Patient ID: Jack Noble, male    DOB: 08-05-1929, 77 y.o.   MRN: [id number]  HPI On April 27 he was seen at urgent care for acute symptoms of diaphoresis and profound malaise. Nasal swab apparently revealed influenza B. He took Tamiflu for 5 days and is now asymptomatic.  He has not take the flu shot as he was ill with the swine flu shot approximately 20 years ago.   Additionally at urgent care  microscopic hematuria was noted.      Review of Systems  He denies fever, chills, or sweats. He also has no dysuria, hematuria or pyuria.       Objective:   Physical Exam he appears healthy; he is in no distress.  There is no conjunctival inflammation or scleral icterus.  Nares are clear without exudate. Oropharynx also reveals no erythema or exudate.  He has no lymphadenopathy about the neck or axilla.  Chest is clear to auscultation without rales or rhonchi.  Heart rhythm is slow and regular without significant murmur  Skin is warm and dry; has no jaundice.        Assessment & Plan:  #1 influenza B; he's recovered totally. Most likely he has immunity to influenza A from the swine flu shot he took several decades ago. The risk of not taking the flu shot each year was discussed.  #2 microscopic hematuria, resolved

## 2011-03-31 ENCOUNTER — Other Ambulatory Visit: Payer: Self-pay

## 2011-03-31 MED ORDER — NYSTATIN-TRIAMCINOLONE 100000-0.1 UNIT/GM-% EX CREA: TOPICAL_CREAM | CUTANEOUS | Status: DC | PRN

## 2011-04-07 NOTE — H&P (Signed)
NAME:  Jack Noble, Jack Noble NO.:  [id number]   MEDICAL RECORD NO.:  [id number]          PATIENT TYPE:  INP   LOCATION:  0109                         FACILITY:  [facility]   PHYSICIAN:  Stacie Glaze, MD    DATE OF BIRTH:  January 24, 1929   DATE OF ADMISSION:  07/12/2009  DATE OF DISCHARGE:                              HISTORY & PHYSICAL   PRIMARY CARE PHYSICIAN:  Dr. [name].   CHIEF COMPLAINT:  Bright red blood per rectum.   HISTORY OF PRESENT ILLNESS:  Mr. Perra is a very pleasant 80-year white  male in good health who reports to [facility] with onset  of bright red blood per rectum last night and again this morning for a  total of 3 bloody bowel movements.  Patient denies any abdominal pain,  nausea, vomiting, diarrhea, or melena.  He was seen in the emergency  department approximately 2 weeks ago for what he describes as severe  sharp abdominal pain with 1 episode of vomiting with negative workup.  Patient was sent home at time of that emergency department visit.  Patient denies any recent dizziness, chest pain, shortness of breath,  abdominal pain, nausea, vomiting, diarrhea, melena, or recent fever.  Patient denies ever having a colonoscopy.   PAST MEDICAL HISTORY:  1. Hyperlipidemia.  2. Hyperglycemia.  3. Hearing loss.  4. Rosacea.  5. Remote cholecystectomy.   MEDICATIONS:  1. Vytorin 10/20 half tab p.o. daily.  2. Saw palmetto 1 capsule p.o. daily.  3. Aspirin 81 mg p.o. daily.  4. Centrum Silver p.o. daily.  5. Vitamin D 1000 mg p.o. daily.  6. Istalol 0.5% solution 1 drop daily.   ALLERGIES:  Allergic to:  1. CODEINE.  2. MINOCYCLINE.   FAMILY HISTORY:  Reviewed and noncontributory to admit.   SOCIAL HISTORY:  Patient is married, is a nonsmoker, nondrinker.   REVIEW OF SYSTEMS:  As stated in HPI, otherwise negative.   PHYSICAL EXAM:  Blood pressure 166/101.  Heart rate 75.  Respirations  18.  Temperature 98.7.  O2  saturation is 100% on room air.  GENERAL:  This is a well-nourished, well-developed, white male in no  acute distress.  HEAD:  Normocephalic, atraumatic.  EYES:  Extraocular movements are intact.  No scleral icterus or  injection.  EAR, NOSE, AND THROAT:  Mucous membranes are moist.  No oropharyngeal  lesions.  NECK:  Supple without thyromegaly or lymphadenopathy.  No JVD or carotid  bruits.  CHEST:  With symmetrical movement.  Nontender to palpation.  CARDIOVASCULAR:  S1 and S2.  Regular rate and rhythm.  No murmurs, rubs,  or gallops.  No lower extremity edema.  RESPIRATORY:  Lung sounds are clear to auscultation bilaterally with no  wheezes, rales, or crackles.  No increased work of breathing.  GI:  Abdomen is soft, nontender, nondistended with positive bowel  sounds.  No appreciated masses or hepatosplenomegaly.  NEUROLOGICAL:  Patient able to move all extremities x4 without any motor  sensory deficits.  SKIN:  Without any suspicious rashes or lesions.  PSYCHOLOGICAL:  Patient is alert, oriented x3 with pleasant mood and  affect.   LAB WORK AND RADIOLOGY:  Hemoglobin 15.3, white cell count 7.6 with no  shift, platelet count 194, sodium 139, potassium 4.2, BUN 11, creatinine  0.8, PTT 28, INR 0.9.  Fecal occult blood positive.   CT angio of the abdomen and pelvis reveals no significant visceral  artery stenosis, extensive diverticulosis without diverticulitis, and a  ventral hernia.   IMPRESSION AND PLAN:  1. Hematochezia.  Differentials include internal hemorrhoids versus      arteriovenous malformation versus diverticular bleed versus      ischemic bowel (less likely without abdominal pain).  We will admit      overnight for gastrointestinal evaluation +/- inpatient endoscopy.      Patient is hemodynamically stable.  We will check a CBC in the a.m.      and make patient n.p.o. until further gastrointestinal evaluation.  2. Hypertension.  I gave asked emergency room staff to  recheck      patient's blood pressure as no previous history of hypertension.      We will monitor.  3. Prophylaxis.  We will order for sequential compression devices.  4. Hyperlipidemia.  We will resume Vytorin at time of discharge and      manage per primary care physician.      Cordelia Pen, NP      Stacie Glaze, MD  Electronically Signed    LE/MEDQ  D:  07/12/2009  T:  07/12/2009  Job:  [id number]   cc:   Titus Dubin. Alwyn Ren, MD,FACP,FCCP  [phone] [place]  [place] [address]

## 2011-04-07 NOTE — Discharge Summary (Signed)
Jack Noble, Jack Noble                  ACCOUNT NO.:  [id number]   MEDICAL RECORD NO.:  [id number]          PATIENT TYPE:  INP   LOCATION:  1505                         [facility]ACILITY:  [facility]   PHYSICIAN:  Barbette Hair. Artist Pais, DO      DATE OF BIRTH:  1929/10/31   DATE OF ADMISSION:  07/12/2009  DATE OF DISCHARGE:  07/14/2009                               DISCHARGE SUMMARY   DISCHARGE DIAGNOSES:  1. Painless hematochezia, presumed secondary to diverticular bleed.  2. Sessile colon polyp.  3. Hyperlipidemia.   DISCHARGE MEDICATIONS:  1. Vytorin 10/20 one half tab daily.  2. Saw Palmetto one tab daily.  3. Vitamin D 1000 units once daily.  4. Patient advised to discontinue aspirin.   FOLLOW-UP INSTRUCTIONS:  Patient advised to call Dr. Frederik Pear office for  follow-up appointment within 1 week.  CBC to be arranged by Dr. Alwyn Ren.   Patient advised to avoid heavy lifting.  He can resume his normal diet.  The patient is to start fiber supplement within 1-2 weeks.   HOSPITAL COURSE:  A 77 year old white male, generally active and  healthy, admitted for bright red blood per rectum times three to four  episodes.  The patient did not have any associated dizziness.  The  patient was seen by [facility] and CT angio of abdomen and pelvis was  obtained.  It showed no significant distal artery stenosis to cause  mesenteric ischemia.  Extensive colonic diverticulosis without  diverticulitis.  Ventral abdominal hernia, containing adipose tissue,  noted incidentally.  Tiny hypodensity in the left kidney was felt to be  nonspecific.  CTA of pelvis showed no iliac artery stenosis.  No  intrapelvic pathology.  The patient was later taken for colonoscopy and  this showed pandiverticulosis.  There was a small sessile polyp,  subcentimeter.  Sessile polyp was not retrieved.  The patient's  hemoglobin did drift slightly lower during hospitalization.  On  admission, hemoglobin was 15.3 and on discharge it was 14.2.  Dr.  Christella Hartigan  felt that as long as the patient was hemodynamically stable and did not  have any further episodes of rectal bleeding, the patient could be  safely discharged home.   I will defer to Dr. Alwyn Ren to arrange GI follow-up.  Dr. Christella Hartigan may  repeat colonoscopy to biopsy sessile polyp.   LABORATORY DATA:  CBC on July 14, 2009:  WBC 7.9, H and H of 14.2,  41.3, platelet count of 172.  Basic metabolic profile showed sodium 136,  potassium 3.5, chloride 102, CO2 28, blood sugar 115, BUN 10, serum  creatinine 0.87.   CONDITION ON DISCHARGE:  Stable on discharge.      Barbette Hair. Artist Pais, DO  Electronically Signed     RDY/MEDQ  D:  07/14/2009  T:  07/15/2009  Job:  [id number]   cc:   Peyton Najjar, MD

## 2011-04-07 NOTE — Assessment & Plan Note (Signed)
[facility]                                 ON-CALL NOTE   [name], SIDES                           MRN:          [id number]  DATE:01/16/2008                            DOB:          03-04-1929    Jack Noble, Jack Noble, called.  [name] patient.  Phone number [phone].  Patient is 77 years old, and is on cholesterol medicine,  otherwise has been fairly well.  Started feeling bad either yesterday or  today, and began vomiting with diarrhea where he cannot keep anything  down, and is feeling weak and pale over Jack last 6 hours or so.  He was  unable to retain water, although she did give him a small amount of  Coke, and it has not come up yet in Jack last half hour.  However, he is  feeling weak, and she does not know what to do.  She believes his fever  is about 101 range, but it is too hard to take his temperature.  He  denies any cough or urinary symptoms.  Questioned, and no abdominal  pain.  Discussed options.  Recommended he be evaluated in Jack emergency  room for above dehydration, intractable vomiting and fever in a 79-year-  old.  She states she will have to call [facility] because he is too weak  for her to transport him now, and I agree.     Neta Mends. Panosh, MD  Electronically Signed    WKP/MedQ  DD: 01/16/2008  DT: 01/17/2008  Job #: [id number]

## 2011-04-10 NOTE — H&P (Signed)
[place]. [facility]  Patient:    Jack Noble, Jack Noble Visit Number: [id number] MRN: [id number]          Service Type: DSU Location: [facility] 2899 32 Attending Physician:  Ivor Messier Dictated by:   Guadelupe Sabin, M.D. Admit Date:  04/11/2002   CC:         Titus Dubin. Alwyn Ren, M.D. [facility] - & EKG, lab data, & chest x-ray   History and Physical  CHIEF COMPLAINT:  This was a planned outpatient readmission of this 77 year old white male, admitted for cataract implant surgery of the right eye.  HISTORY OF PRESENT ILLNESS:  This patient has developed very slowly a dense brunescent nuclear cataract formation in both eyes.  He was previously admitted on January 31, 2002, and had an extracapsular cataract extraction with the primary insertion of a posterior chamber intraocular lens implant without phacoemulsification.  The lens was extremely black in color, and felt to be too firm in texture for phacoemulsification.  Postoperatively the patient did well, with return of vision to 20/20 with refraction.  Meanwhile the unoperated eye which was hyperopic compared to the myopic operated eye, developed an anisotropia along with the persistent cataract formation.  It was therefore elected to proceed with a cataract implant surgery of the right eye, as the patient found the anisotropia intolerable, despite a slab-off bifocal. Arrangements were made for his outpatient admission at this time.  PAST MEDICAL HISTORY:  Excellent general health, under the care of Dr. Titus Dubin. [name].  He is felt to be a good surgical risk for the proposed surgery by Dr. Alwyn Ren.  CURRENT MEDICATIONS:  The patient takes no prescription drugs.  ALLERGIES:  I said to be allergic to CODEINE AND ETHER.  REVIEW OF SYSTEMS:  No cardiorespiratory complaints.  PHYSICAL EXAMINATION  VITAL SIGNS:  As recorded on admission, blood pressure 127/83, respirations 18, heart rate 65, temperature 97  degrees.  GENERAL:  The patient is a pleasant well-developed, well-nourished 76 year old white male, in no acute distress.  HEENT:  Eyes:  Visual acuity as noted above.  Slit lamp examination: Brunescent nuclear cataract formation, right eye, but not quite as bad as the preceding left eye.  A posterior chamber intraocular lens implant is present in the left eye.  Applanation tonometry and detailed fundus examination with indirect ophthalmoscopy are both within normal limits.  Intraocular pressure measures 16 mm in each eye by applanation tonometry.  CHEST:  Lungs clear to percussion and auscultation.  HEART:  Normal sinus rhythm.  No cardiomegaly, no murmurs.  ABDOMEN:  Negative.  EXTREMITIES:  Negative.  ADMITTING DIAGNOSES 1. Senile brunescent nuclear cataract, right eye. 2. Pseudophakia, left eye.  SURGICAL PLAN:  Cataract implant surgery, right eye. Dictated by:   Guadelupe Sabin, M.D. Attending Physician:  Ivor Messier DD:  04/11/02 TD:  04/11/02 Job: [id number]

## 2011-04-10 NOTE — H&P (Signed)
[place]. [facility]  Patient:    YEIREN, WHITECOTTON Visit Number: [id number] MRN: [id number]          Service Type: DSU Location: [facility] 2899 31 Attending Physician:  Ivor Messier Dictated by:   Guadelupe Sabin, M.D. Admit Date:  01/31/2002 Discharge Date: 01/31/2002   CC:         Titus Dubin. Alwyn Ren, M.D. [facility] - & Lab data, EKG, chest x-ray   History and Physical  CHIEF COMPLAINT:  This was a planned outpatient surgical admission for this 77 year old white male, admitted for cataract implant surgery of the left eye.  HISTORY OF PRESENT ILLNESS:  This patient has noted gradual deterioration of vision in both eyes.  He has been followed in my office since August 20, 1999.  The patient had been followed by Dr. Dimas Aguas B. [place], his previous ophthalmologist at that time, and was said to have had cataract formation.  Examination in my office confirmed this diagnosis.  The visual acuity, however, was 20/30 in each eye, and it was elected to change glasses, and hope that his vision would be maintained.  The cataract became extremely dense and brunescent in color, indicating a firmness of the texture of the cataract.  It was elected to proceed with cataract implant surgery.  When seen on October 25, 2001, the vision had deteriorated to 20/50-1 in the left eye, 20/40-2 in the right eye.  The patient himself had noted deterioration and further blurring of vision.  He signed an informed consent, indicating that he had blurred smoky or dim vision, difficulty with driving, seeing road signs, difficulty with night vision, difficulty with bright sunlight, and trouble doing his golf hobby.  He elected to proceed with surgery.  PAST MEDICAL HISTORY:  The patient is in excellent general health, under the care of Dr. Titus Dubin. [name].  REVIEW OF SYSTEMS:  No cardiorespiratory complaints.  PHYSICAL EXAMINATION  GENERAL:  The patient is a healthy,  well-developed, well-nourished 77 year old white male, in no acute distress.  HEENT:  Eyes:  The eyes are white and clear with brunescent dense nuclear cataract in both eyes.  A detailed fundus examination revealed a clear vitreous behind the dim cataract, with normal retina, optic nerve, and blood vessels.  The intraocular pressure was normal at all times.  LUNGS:  Clear to percussion and auscultation.  HEART:  Normal sinus rhythm, no cardiomegaly, no murmurs.  ABDOMEN:  Negative.  EXTREMITIES:  Negative.  ADMITTING DIAGNOSIS:  Senile brunescent cataract, both eyes.  SURGICAL PLAN:  Cataract implant surgery in the left eye now, right eye later. Dictated by:   Guadelupe Sabin, M.D. Attending Physician:  Ivor Messier DD:  01/31/02 TD:  01/31/02 Job: [id number]

## 2011-04-10 NOTE — Op Note (Signed)
[place]. [facility]  Patient:    Jack Noble, Jack Noble Visit Number: [id number] MRN: [id number]          Service Type: DSU Location: [facility] 2899 31 Attending Physician:  Ivor Messier Dictated by:   Guadelupe Sabin, M.D. Proc. Date: 01/31/02 Admit Date:  01/31/2002 Discharge Date: 01/31/2002   CC:         Titus Dubin. Alwyn Ren, M.D. [facility]   Operative Report  PREOPERATIVE DIAGNOSIS:  Brunescent nuclear cataract, left eye.  POSTOPERATIVE DIAGNOSIS:  Brunescent nuclear cataract, left eye.  OPERATION PERFORMED:  Planned extracapsular cataract extraction without phacoemulsification, with primary insertion of posterior chamber intraocular lens implant, left eye.  SURGEON:  Guadelupe Sabin, M.D.  ASSISTANT:  Nurse.  ANESTHESIA:  Local 4% Xylocaine, 0.75% Marcaine, retrobulbar block, topical tetracaine, intraocular Xylocaine.  The patient given sodium pentothal intravenously during the period of retrobulbar blocking by the attending nurse anesthetist.  DESCRIPTION OF PROCEDURE:  After the patient was prepped and draped, a lid speculum was inserted into the left eye.  The eye was turned downward and a superior rectus traction suture placed.  Schiotz tonometry was recorded at 5 scale units with a 5.5 g weight.  A peritomy was performed adjacent to the limbus, extending from the 10 to the 2 oclock position.  The corneal-scleral junction was cleaned and a corneal-scleral groove made at the 12 oclock position.  The incision was made into the eye with the 2.5 mm diamond keratome at the 12 oclock position, and the 15-degree blade at the 2:30 position. Using a bent #26 gauge needle on a Healon syringe, a circular capsular rhexis was begun.  It was completed with the Grabow forceps.  Visualization was extremely poor; however, due to the dense brunescent cataract.  The hydrodissection and hydrodelineation were performed using 1% Xylocaine.  The lens was rotated in  place with the hydrodissection Sinskey cannula.  It was felt that the cataract was extremely brunescent and the technique should be a planned extracapsular without phacoemulsification, fearing that the long phacoemulsification time might endanger the corneal endothelium.  Therefore the incision was enlarged with the curved corneal-scleral scissors.  Using pressure at the 6 and 12 oclock positions, the nucleus was expressed without difficulty.  It was extremely Maberry and almost black in color.  Then #7-0 Vicryl sutures, four in number, were placed across the corneal-scleral junction.  Using the irrigation aspiration tip, the residual cortex was aspirated.  It was therefore elected to insert an [employer] [id number] silicone three-piece posterior chamber intraocular lens implant, diopter strength +22.00.  This was inserted folded into the anterior chamber with the McDonald forceps, opened, and then centered into the capsular bag using the [facility] lens rotator.  The lens appeared to be well-centered.  The corneal-scleral junction was then closed with multiple #10-0 interrupted nylon sutures.  The residual cortex was aspirated with the irrigation aspiration tip.  The patient was given Diamox 500 mg intravenously at the end of the operative procedure, to prevent elevation of intraocular pressure.  Pilopine 4% ophthalmic gel was instilled in the conjunctival cul-de-sac, along with Maxitrol ointment.  A light patch and protector shield were applied to the operated left eye.  The duration of the procedure and anesthesia administration was 45 minutes.  The patient tolerated the procedure well in general and left the operating room for the recovery room in good condition.  DISPOSITION:  The patient is to return to the office at 4 p.m. for a  postoperative checking. Dictated by:   Guadelupe Sabin, M.D. Attending Physician:  Ivor Messier DD:  01/31/02 TD:  01/31/02 Job:  [id number]

## 2011-04-10 NOTE — Op Note (Signed)
[place]  Patient:    Jack Noble, Jack Noble Visit Number: [id number] MRN: [id number]          Service Type: DSU Location: [facility] [id number] Attending Physician:  Ivor Messier Dictated by:   Guadelupe Sabin, M.D. Proc. Date: 04/11/02 Admit Date:  04/11/2002   CC:         Titus Dubin. Alwyn Ren, M.D. [facility]   Operative Report  PREOPERATIVE DIAGNOSIS:  Brunescent nuclear cataract, right eye.  POSTOPERATIVE DIAGNOSIS:  Brunescent nuclear cataract, right eye.  OPERATION PERFORMED:  Planned extracapsular cataract extraction - phacoemulsification, primary insertion of posterior chamber intraocular lens implant, right eye.  SURGEON:  Guadelupe Sabin, M.D.  ASSISTANT:  Nurse.  ANESTHESIA:  Local 4% Xylocaine, 0.75% Marcaine, retrobulbar block, topical tetracaine, intraocular Xylocaine.  DESCRIPTION OF PROCEDURE:  After the patient was prepped and draped a lid speculum was inserted in the right eye.  The eye was turned downward and a superior rectus traction suture placed.  Schiotz tonometry was recorded at 7 scale units with a 5.5 g weight.  A peritomy was performed adjacent to the limbus from the 11 to the 1 oclock position.  The corneal-scleral junction was cleaned and a corneal-scleral groove made with a 45-degree Super blade. The anterior chamber was then entered with the 2.5 mm diamond keratome at the 12 oclock position, and the 15-degree blade at the 2:30 position.  Using a bent #26 gauge needle on a Healon syringe, a circular capsular rhexis was begun and then completed with the Grabow forceps.  Hydrodissection and hydrodelineation were performed using 1% Xylocaine.  The 30-degree phacoemulsification tip was then inserted with lens chopping.  The nucleus was fragmented into several smaller pieces and emulsified.  The total ultrasonic time was two minutes and two seconds, average power level 23%, total amount of fluid used 90 cc.  Following  the removal of the dense nucleus, the residual cortex was aspirated with the irrigation aspiration tip.  The posterior capsule appeared intact with a brilliant red fundus reflex.  It was therefore elected to insert an [employer] [id number] silicone three-piece posterior chamber intraocular lens implant with UV absorber, diopter strength +22.00.  This was inserted and folded into the anterior chamber and then opened and centered into the capsular bag using the [facility] lens rotator.  The lens appeared to be in good position.  The operative incisions appeared to be self-sealing.  It was elected, however, to use one #10-0 interrupted nylon suture at the 12 oclock position.  The conjunctiva which had been opened from the 10 to the 2 oclock position was closed with a running #8-0 Vicryl suture. Depo-Garamycin and Celestone were injected in the sub-Tenons space inferiorly, and Maxitrol ointment instilled in the conjunctival cul-de-sac.  A light patch and protector shield were applied to the operated right eye.  The duration of the procedure and anesthesia administration was 45 minutes.  The patient tolerated the procedure well in general and left the operating room for the recovery room in good condition. Dictated by:   Guadelupe Sabin, M.D. Attending Physician:  Ivor Messier DD:  04/11/02 TD:  04/11/02 Job: [id number]

## 2011-04-30 ENCOUNTER — Encounter: Payer: Self-pay | Admitting: Internal Medicine

## 2011-04-30 ENCOUNTER — Ambulatory Visit (INDEPENDENT_AMBULATORY_CARE_PROVIDER_SITE_OTHER): Payer: MEDICARE | Admitting: Internal Medicine

## 2011-04-30 VITALS — BP 138/70 | HR 72 | Temp 98.1°F | Wt 160.4 lb

## 2011-04-30 DIAGNOSIS — W57XXXA Bitten or stung by nonvenomous insect and other nonvenomous arthropods, initial encounter: Secondary | ICD-10-CM

## 2011-04-30 DIAGNOSIS — L0390 Cellulitis, unspecified: Secondary | ICD-10-CM

## 2011-04-30 DIAGNOSIS — L02419 Cutaneous abscess of limb, unspecified: Secondary | ICD-10-CM

## 2011-04-30 MED ORDER — DOXYCYCLINE HYCLATE 100 MG PO TABS: 100.0000 mg | ORAL_TABLET | Freq: Two times a day (BID) | ORAL | Status: AC

## 2011-04-30 NOTE — Patient Instructions (Signed)
Avoid sun while on Doxycycline. Wet to dry saline gauze 3-4 X/ day

## 2011-04-30 NOTE — Progress Notes (Signed)
  Subjective:    Patient ID: Jack Noble, male    DOB: 1929/06/19, 77 y.o.   MRN: [id number]  HPI Tick bite noted  04/24/2011; seen @ [facility] 06/02 & Doxycycline 100 mg bid X 1 dose. Since rash has increased. Location: L inner thigh Self-treated with: H2O2  topically              Improvement with treatment: equivocal History Pruritis: yes, today  Tenderness: no   Red Flags Feeling ill: no  Fever: no  Headache: no  Facial/tongue swelling/difficulty breathing:  no  Diabetic or immunocompromised: no      Review of Systems     Objective:   Physical Exam Gen.: Healthy and well-nourished in appearance. Alert, appropriate and cooperative throughout exam. Neck: No deformities, masses, or tenderness noted. Range of motion normal.Neck supple Lungs: Normal respiratory effort; chest expands symmetrically. Lungs are clear to auscultation without rales, wheezes, or increased work of breathing. Heart: Normal rate and rhythm. Normal S1 and S2. No gallop, click, or rub. S4 w/o  murmur. Abdomen: Bowel sounds normal; abdomen soft and nontender. No masses, organomegaly .Incisional  Hernia  noted. Neurologic: Alert and oriented x3. Deep tendon reflexes symmetrical and normal.          Skin:10 X 15 mm cellulitis L thigh Lymph: No cervical, axillary, or inguinal lymphadenopathy present. Psych: Mood and affect are normal. Normally interactive                                                                                         Assessment & Plan:  #1 cellulitis Plan: Doxycycline X 7 days

## 2011-05-02 ENCOUNTER — Encounter: Payer: Self-pay | Admitting: Internal Medicine

## 2011-05-07 ENCOUNTER — Ambulatory Visit (INDEPENDENT_AMBULATORY_CARE_PROVIDER_SITE_OTHER): Payer: MEDICARE | Admitting: Internal Medicine

## 2011-05-07 ENCOUNTER — Encounter: Payer: Self-pay | Admitting: Internal Medicine

## 2011-05-07 VITALS — BP 130/78 | HR 56 | Temp 97.8°F | Resp 14 | Ht 66.0 in | Wt 158.8 lb

## 2011-05-07 DIAGNOSIS — N400 Enlarged prostate without lower urinary tract symptoms: Secondary | ICD-10-CM

## 2011-05-07 DIAGNOSIS — Z0000 Encounter for general adult medical examination without abnormal findings: Secondary | ICD-10-CM

## 2011-05-07 DIAGNOSIS — E785 Hyperlipidemia, unspecified: Secondary | ICD-10-CM

## 2011-05-07 DIAGNOSIS — Z86010 Personal history of colonic polyps: Secondary | ICD-10-CM

## 2011-05-07 LAB — BASIC METABOLIC PANEL
BUN: 12 mg/dL (ref 6–23)
CO2: 30 mEq/L (ref 19–32)
GFR: 146.07 mL/min (ref >60.00)
Glucose, Bld: 96 mg/dL (ref 70–99)
Potassium: 4.2 mEq/L (ref 3.5–5.1)

## 2011-05-07 LAB — CBC WITH DIFFERENTIAL/PLATELET
Basophils Absolute: 0 10*3/uL (ref 0.0–0.1)
Eosinophils Absolute: 0.1 10*3/uL (ref 0.0–0.7)
HCT: 43.9 % (ref 39.0–52.0)
Hemoglobin: 15 g/dL (ref 13.0–17.0)
Lymphs Abs: 1.7 10*3/uL (ref 0.7–4.0)
MCHC: 34.2 g/dL (ref 30.0–36.0)
Monocytes Absolute: 0.5 10*3/uL (ref 0.1–1.0)
Monocytes Relative: 7.7 % (ref 3.0–12.0)
Neutro Abs: 3.8 10*3/uL (ref 1.4–7.7)
Platelets: 193 10*3/uL (ref 150.0–400.0)
RDW: 13.6 % (ref 11.5–14.6)

## 2011-05-07 LAB — HEPATIC FUNCTION PANEL
ALT: 18 U/L (ref 0–53)
AST: 25 U/L (ref 0–37)
Alkaline Phosphatase: 38 U/L — ABNORMAL LOW (ref 39–117)
Bilirubin, Direct: 0.1 mg/dL (ref 0.0–0.3)
Total Bilirubin: 0.9 mg/dL (ref 0.3–1.2)

## 2011-05-07 LAB — LIPID PANEL
Cholesterol: 131 mg/dL (ref 0–200)
VLDL: 12.8 mg/dL (ref 0.0–40.0)

## 2011-05-07 LAB — TSH: TSH: 1.3 u[IU]/mL (ref 0.35–5.50)

## 2011-05-07 LAB — PSA: PSA: 1.02 ng/mL (ref 0.10–4.00)

## 2011-05-07 NOTE — Progress Notes (Signed)
Subjective:    Patient ID: Jack Noble, male    DOB: 1929/05/26, 77 y.o.   MRN: [id number]  HPI Medicare Wellness Visit:  The following psychosocial & medical history were reviewed as required by Medicare.   Social history: caffeine: 1 cup/ day , alcohol:  no ,  tobacco use : never  & exercise : walking 4X/week 30- 60 min.   Home & personal  safety / fall risk: no issues, activities of daily living: no limitations , seatbelt use : yes , and smoke alarm employment : yes .  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 Orientation :orientedx3  , memory & recall :good, spelling or math testing: good,and mood & affect : normal . Depression / anxiety: denied Travel history : 88s [place] , immunization status :shingles needed , transfusion history:  no, and preventive health surveillance ( colonoscopies, BMD , etc as per protocol/ SOC): up to date, Dental care:  Seen every 6 mos . Chart reviewed &  Updated. Active issues reviewed & addressed.    Review of Systems Patient reports no  hearing changes,anorexia, weight change, fever ,adenopathy, persistant / recurrent hoarseness, swallowing issues, chest pain,palpitations, edema,persistant / recurrent cough, hemoptysis, dyspnea(rest, exertional, paroxysmal nocturnal), gastrointestinal  bleeding (melena, rectal bleeding), abdominal pain, excessive heart burn, GU symptoms( dysuria, hematuria, pyuria, voiding/incontinence  issues) syncope, focal weakness, memory loss,numbness & tingling, skin/hair/nail changes,depression, anxiety, abnormal bruising/bleeding, musculoskeletal symptoms/signs. 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     Objective:   Physical Exam Gen.: Healthy and well-nourished in appearance. Alert, appropriate and cooperative throughout exam. Head: Normocephalic without obvious  abnormalities;  pattern alopecia  Eyes: No corneal or conjunctival inflammation noted. Pupils equal round reactive to light and accommodation. Fundal exam is benign without hemorrhages, exudate, papilledema. Extraocular motion intact. Vision grossly normal.Slight ptosisof lids Ears: External  ear exam reveals no significant lesions or deformities. Canals clear .TMs normal. Hearing is grossly normal bilaterally. Nose: External nasal exam reveals no deformity or inflammation. Nasal mucosa are pink and moist. No lesions or exudates noted.Mouth: Oral mucosa and oropharynx reveal no lesions or exudates. Teeth in good repair. Neck: No deformities, masses, or tenderness noted. Range of motion & . Thyroid normal. Lungs: Normal respiratory effort; chest expands symmetrically. Lungs are clear to auscultation without rales, wheezes, or increased work of breathing. Heart: Normal rate and rhythm. Normal S1 and S2. No gallop, click, or rub. S4 with slurring; no murmur. Abdomen: Bowel sounds normal; abdomen soft and nontender. No masses or  organomegaly .Large ventral or hernia  noted. Genitalia/DRE : 2+ prostatic hypertrophy                                                                                      Musculoskeletal/extremities: No deformity or scoliosis noted of  the thoracic or lumbar spine. No clubbing, cyanosis, edema, or deformity noted. Range of motion  normal .Tone & strength  normal.Joints normal. Nail health  good. Vascular: Carotid, radial artery, dorsalis  pedis and dorsalis posterior tibial pulses are full and equal. No bruits present. Neurologic: Alert and oriented x3. Deep tendon reflexes symmetrical and normal.          Skin: Intact without suspicious lesions .Faint resolving erythema L medial thigh.Keratoses & solar scalp changes.. Lymph: No cervical, axillary, or inguinal lymphadenopathy present. Psych: Mood and affect are normal. Normally interactive                                                                                          Assessment & Plan:  #1 Medicare Wellness Exam; criteria met ; data entered #2 Problem List reviewed ; Assessment/ Recommendations made Plan: see Orders

## 2011-05-07 NOTE — Patient Instructions (Addendum)
Preventive Health Care: Exercise at least 30-45 minutes a day,  3-4 days a week.  Eat a low-fat diet with lots of fruits and vegetables, up to 7-9 servings per day. Consume less than 40 grams of sugar per day from foods & drinks with High Fructose Corn Sugar as #2,3 or # 4 on label. Complete  Entire course of  Doxycycline

## 2011-05-08 ENCOUNTER — Encounter: Payer: Self-pay | Admitting: Internal Medicine

## 2011-05-24 DIAGNOSIS — A4151 Sepsis due to Escherichia coli [E. coli]: Secondary | ICD-10-CM

## 2011-05-24 HISTORY — DX: Sepsis due to Escherichia coli (e. coli): A41.51

## 2011-05-30 ENCOUNTER — Inpatient Hospital Stay (HOSPITAL_COMMUNITY)
Admission: EM | Admit: 2011-05-30 | Discharge: 2011-06-05 | DRG: 872 | Disposition: A | Discharge status: Home Health | Payer: MEDICARE | Attending: Internal Medicine | Admitting: Internal Medicine

## 2011-05-30 ENCOUNTER — Emergency Department (HOSPITAL_COMMUNITY): Payer: MEDICARE

## 2011-05-30 DIAGNOSIS — J40 Bronchitis, not specified as acute or chronic: Secondary | ICD-10-CM | POA: Diagnosis present

## 2011-05-30 DIAGNOSIS — E860 Dehydration: Secondary | ICD-10-CM | POA: Diagnosis present

## 2011-05-30 DIAGNOSIS — Z7982 Long term (current) use of aspirin: Secondary | ICD-10-CM

## 2011-05-30 DIAGNOSIS — R7402 Elevation of levels of lactic acid dehydrogenase (LDH): Secondary | ICD-10-CM | POA: Diagnosis present

## 2011-05-30 DIAGNOSIS — B9689 Other specified bacterial agents as the cause of diseases classified elsewhere: Secondary | ICD-10-CM | POA: Diagnosis present

## 2011-05-30 DIAGNOSIS — Z66 Do not resuscitate: Secondary | ICD-10-CM | POA: Diagnosis present

## 2011-05-30 DIAGNOSIS — R7881 Bacteremia: Principal | ICD-10-CM | POA: Diagnosis present

## 2011-05-30 DIAGNOSIS — E7800 Pure hypercholesterolemia, unspecified: Secondary | ICD-10-CM | POA: Diagnosis present

## 2011-05-30 DIAGNOSIS — Z9089 Acquired absence of other organs: Secondary | ICD-10-CM

## 2011-05-30 DIAGNOSIS — N400 Enlarged prostate without lower urinary tract symptoms: Secondary | ICD-10-CM | POA: Diagnosis present

## 2011-05-30 DIAGNOSIS — E876 Hypokalemia: Secondary | ICD-10-CM | POA: Diagnosis present

## 2011-05-30 DIAGNOSIS — R7401 Elevation of levels of liver transaminase levels: Secondary | ICD-10-CM | POA: Diagnosis present

## 2011-05-30 DIAGNOSIS — I959 Hypotension, unspecified: Secondary | ICD-10-CM | POA: Diagnosis present

## 2011-05-30 LAB — CBC
Hemoglobin: 13.2 g/dL (ref 13.0–17.0)
MCHC: 33.7 g/dL (ref 30.0–36.0)
RDW: 12.8 % (ref 11.5–15.5)
WBC: 5.2 10*3/uL (ref 4.0–10.5)

## 2011-05-30 LAB — COMPREHENSIVE METABOLIC PANEL
ALT: 77 U/L — ABNORMAL HIGH (ref 0–53)
AST: 128 U/L — ABNORMAL HIGH (ref 0–37)
Albumin: 3.2 g/dL — ABNORMAL LOW (ref 3.5–5.2)
Calcium: 8.7 mg/dL (ref 8.4–10.5)
Creatinine, Ser: 0.83 mg/dL (ref 0.50–1.35)
Sodium: 134 mEq/L — ABNORMAL LOW (ref 135–145)

## 2011-05-30 LAB — URINALYSIS, ROUTINE W REFLEX MICROSCOPIC
Bilirubin Urine: NEGATIVE
Glucose, UA: NEGATIVE mg/dL
Nitrite: NEGATIVE
Specific Gravity, Urine: 1.013 (ref 1.005–1.030)
pH: 5.5 (ref 5.0–8.0)

## 2011-05-30 LAB — DIFFERENTIAL
Basophils Absolute: 0 10*3/uL (ref 0.0–0.1)
Eosinophils Relative: 1 % (ref 0–5)
Lymphs Abs: 0.2 10*3/uL — ABNORMAL LOW (ref 0.7–4.0)
Monocytes Absolute: 0.1 10*3/uL (ref 0.1–1.0)
Monocytes Relative: 1 % — ABNORMAL LOW (ref 3–12)
Neutrophils Relative %: 95 % — ABNORMAL HIGH (ref 43–77)
WBC Morphology: INCREASED

## 2011-05-30 LAB — LACTIC ACID, PLASMA: Lactic Acid, Venous: 3.4 mmol/L — ABNORMAL HIGH (ref 0.5–2.2)

## 2011-05-30 LAB — URINE MICROSCOPIC-ADD ON

## 2011-05-31 ENCOUNTER — Inpatient Hospital Stay (HOSPITAL_COMMUNITY): Payer: MEDICARE

## 2011-05-31 LAB — DIFFERENTIAL
Basophils Relative: 0 % (ref 0–1)
Eosinophils Absolute: 0 10*3/uL (ref 0.0–0.7)
Lymphocytes Relative: 1 % — ABNORMAL LOW (ref 12–46)
Lymphs Abs: 0.2 10*3/uL — ABNORMAL LOW (ref 0.7–4.0)
Neutro Abs: 15.7 10*3/uL — ABNORMAL HIGH (ref 1.7–7.7)

## 2011-05-31 LAB — COMPREHENSIVE METABOLIC PANEL
ALT: 105 U/L — ABNORMAL HIGH (ref 0–53)
Alkaline Phosphatase: 56 U/L (ref 39–117)
BUN: 15 mg/dL (ref 6–23)
CO2: 20 mEq/L (ref 19–32)
Chloride: 106 mEq/L (ref 96–112)
GFR calc Af Amer: >60 mL/min (ref >60)
GFR calc non Af Amer: >60 mL/min (ref >60)
Glucose, Bld: 150 mg/dL — ABNORMAL HIGH (ref 70–99)
Potassium: 3.5 mEq/L (ref 3.5–5.1)
Sodium: 135 mEq/L (ref 135–145)
Total Bilirubin: 1.2 mg/dL (ref 0.3–1.2)

## 2011-05-31 LAB — URINALYSIS, MICROSCOPIC ONLY
Bilirubin Urine: NEGATIVE
Hgb urine dipstick: NEGATIVE
Protein, ur: NEGATIVE mg/dL
Urobilinogen, UA: 1 mg/dL (ref 0.0–1.0)

## 2011-05-31 LAB — CBC
MCH: 32 pg (ref 26.0–34.0)
MCHC: 34.1 g/dL (ref 30.0–36.0)
MCV: 93.8 fL (ref 78.0–100.0)
Platelets: 117 10*3/uL — ABNORMAL LOW (ref 150–400)
RBC: 3.53 MIL/uL — ABNORMAL LOW (ref 4.22–5.81)

## 2011-05-31 LAB — CARDIAC PANEL(CRET KIN+CKTOT+MB+TROPI)
CK, MB: 6 ng/mL — ABNORMAL HIGH (ref 0.3–4.0)
Relative Index: 1.9 (ref 0.0–2.5)
Troponin I: <0.3 ng/mL (ref <0.30)

## 2011-05-31 LAB — PROCALCITONIN: Procalcitonin: 39.15 ng/mL

## 2011-05-31 NOTE — H&P (Signed)
NAME:  CLEMMIE, MARXEN NO.:  [id number]  MEDICAL RECORD NO.:  [id number]  LOCATION:  WLED                         FACILITY:  [facility]  PHYSICIAN:  Michiel Cowboy, MDDATE OF BIRTH:  01-11-1929  DATE OF ADMISSION:  05/30/2011 DATE OF DISCHARGE:                             HISTORY & PHYSICAL   PRIMARY CARE PROVIDER:  Sandria Bales. Alwyn Ren, MD  CHIEF COMPLAINT:  Fever, nausea, vomiting, feeling unwell.  The patient is an 77 year old gentleman with past history of hyperlipidemia, ventral hernia, status post cholecystectomy.  The patient has been feeling very well up until now.  He usually walks 2 miles a day, very functional.  He presents with the above-mentioned symptoms to the emergency department.  His wife did state that he is not drinking and eating as much as he should.  The only other thing is that 1 month ago he had a tick bite and was put on amoxicillin for that.  No other recent tick bite exposure.  No headache.  No neck stiffness.  No urinary complaints.  No back pain.  No abdominal complaints.  Otherwise, review of systems negative.  PAST MEDICAL HISTORY: 1. Hernia. 2. Hypercholesteremia. 3. The patient is status post cholecystectomy.  SOCIAL HISTORY:  The patient lives with spouse, never drink or smoke, does not abuse drugs.  FAMILY HISTORY:  Noncontributory.  ALLERGIES:  CODEINE what sounds like inhale and anesthetic.  MEDICATIONS: 1. Aspirin 81 mg, he takes 2 tablets daily. 2. Vytorin 10/10 daily. 3. Vitamin D. 4. Centrum Silver.  PHYSICAL EXAMINATION:  VITAL SIGNS:  T-max 103.4, blood pressure 146/105, respirations 19, saturating 98% on room air.  CARDIAC:  Heart rate normal sinus rhythm at 110. GENERAL:  The patient appears to be in no acute distress. HEENT:  Dry mucous membranes, somewhat decreased skin turgor. LUNGS:  Somewhat distant breath sounds bilaterally, but no crackles or wheezes noted. HEART:  Regular rate and rhythm.  No  murmurs appreciated. ABDOMEN:  Soft, nontender, nondistended.  Ventral hernia is noted. LOWER EXTREMITIES:  Without clubbing, cyanosis, or edema. NEUROLOGIC:  Grossly intact. SKIN:  Clear, dry, and intact for exception of what appears to be actinic keratosis on his chest.  LABORATORY DATA:  White blood cell count 5.2, hemoglobin 13.2.  Sodium 134, potassium 3.3, creatinine 0.83.  AST slightly elevated 128 which is new from June 14th, ALT 77, albumin 3.2, lactic acid elevated at 3.4. UA showing 7-10 white blood cells and chest x-ray is unremarkable. Otherwise all the findings unremarkable.  ASSESSMENT AND PLAN:  This is an 77 year old gentleman who presents with fever and with history of urinary tract infection, but also elevated white blood cell count, although he is status post cholecystectomy. 1. Urinary tract infection.  We will admit, start him on Rocephin.     Await urine culture at this point, although his lactate is somewhat     elevated.  His blood pressure is stable.  He does not appear to be     septic. 2. Dehydration.  Give IV fluids and check his orthostatics. 3. Hypokalemia.  We will replace. 4. Elevated liver function tests.  The patient has not had any  abdominal pain.  He is status post cholecystectomy.  There does not     seem to be an obstructive picture.  We will obtain right upper     quadrant ultrasound in a.m. to evaluate this further. 5. Prophylaxis.  Protonix and SCDs. 6. Code status.  The patient wished to be do not resuscitate, do not     intubate. 7. Elevated lactic acid.  We will continue to monitor.  At this point,     he does not appear to be septic.  If there is any evidence of liver     dysfunction, sometimes it can elevate lactic acid artificially, but     we will continue to follow this.     Michiel Cowboy, MD     AVD/MEDQ  D:  05/31/2011  T:  05/31/2011  Job:  [id number]  cc:   Peyton Najjar, MD Fax: [phone]  Electronically  Signed by Therisa Doyne MD on 05/31/2011 03:00:52 AM

## 2011-06-01 ENCOUNTER — Inpatient Hospital Stay (HOSPITAL_COMMUNITY): Payer: MEDICARE

## 2011-06-01 DIAGNOSIS — A419 Sepsis, unspecified organism: Secondary | ICD-10-CM

## 2011-06-01 LAB — PROTIME-INR
INR: 1.49 (ref 0.00–1.49)
Prothrombin Time: 18.3 seconds — ABNORMAL HIGH (ref 11.6–15.2)

## 2011-06-01 LAB — DIFFERENTIAL
Basophils Absolute: 0 10*3/uL (ref 0.0–0.1)
Eosinophils Absolute: 0 10*3/uL (ref 0.0–0.7)
Eosinophils Relative: 0 % (ref 0–5)
Monocytes Relative: 0 % — ABNORMAL LOW (ref 3–12)
Neutrophils Relative %: 62 % (ref 43–77)

## 2011-06-01 LAB — COMPREHENSIVE METABOLIC PANEL
ALT: 97 U/L — ABNORMAL HIGH (ref 0–53)
Alkaline Phosphatase: 67 U/L (ref 39–117)
CO2: 21 mEq/L (ref 19–32)
Calcium: 7.7 mg/dL — ABNORMAL LOW (ref 8.4–10.5)
GFR calc Af Amer: >60 mL/min (ref >60)
GFR calc non Af Amer: >60 mL/min (ref >60)
Glucose, Bld: 104 mg/dL — ABNORMAL HIGH (ref 70–99)
Sodium: 137 mEq/L (ref 135–145)
Total Bilirubin: 1.7 mg/dL — ABNORMAL HIGH (ref 0.3–1.2)

## 2011-06-01 LAB — URINE CULTURE
Colony Count: NO GROWTH
Culture  Setup Time: 201207080250
Culture: NO GROWTH

## 2011-06-01 LAB — CBC
HCT: 34.4 % — ABNORMAL LOW (ref 39.0–52.0)
Hemoglobin: 12.1 g/dL — ABNORMAL LOW (ref 13.0–17.0)
MCH: 33 pg (ref 26.0–34.0)
MCHC: 35.2 g/dL (ref 30.0–36.0)
MCV: 93.7 fL (ref 78.0–100.0)
Platelets: 103 10*3/uL — ABNORMAL LOW (ref 150–400)
RBC: 3.67 MIL/uL — ABNORMAL LOW (ref 4.22–5.81)
RDW: 13.4 % (ref 11.5–15.5)
WBC: 21.4 10*3/uL — ABNORMAL HIGH (ref 4.0–10.5)

## 2011-06-01 LAB — CARDIAC PANEL(CRET KIN+CKTOT+MB+TROPI)
CK, MB: 6.1 ng/mL (ref 0.3–4.0)
Relative Index: 2 (ref 0.0–2.5)
Total CK: 307 U/L — ABNORMAL HIGH (ref 7–232)
Troponin I: <0.3 ng/mL (ref <0.30)

## 2011-06-01 LAB — LACTIC ACID, PLASMA: Lactic Acid, Venous: 1.6 mmol/L (ref 0.5–2.2)

## 2011-06-01 LAB — STREP PNEUMONIAE URINARY ANTIGEN: Strep Pneumo Urinary Antigen: NEGATIVE

## 2011-06-01 MED ORDER — IOHEXOL 300 MG/ML  SOLN
100.0000 mL | Freq: Once | INTRAMUSCULAR | Status: AC | PRN
  Administered 2011-06-01: 100 mL via INTRAVENOUS

## 2011-06-02 LAB — CBC
HCT: 34.7 % — ABNORMAL LOW (ref 39.0–52.0)
MCHC: 34.3 g/dL (ref 30.0–36.0)
Platelets: 109 10*3/uL — ABNORMAL LOW (ref 150–400)
RDW: 13.4 % (ref 11.5–15.5)
WBC: 18.6 10*3/uL — ABNORMAL HIGH (ref 4.0–10.5)

## 2011-06-02 LAB — CULTURE, BLOOD (ROUTINE X 2): Culture  Setup Time: 201207081125

## 2011-06-02 LAB — COMPREHENSIVE METABOLIC PANEL
ALT: 83 U/L — ABNORMAL HIGH (ref 0–53)
AST: 66 U/L — ABNORMAL HIGH (ref 0–37)
Albumin: 2.7 g/dL — ABNORMAL LOW (ref 3.5–5.2)
Alkaline Phosphatase: 122 U/L — ABNORMAL HIGH (ref 39–117)
BUN: 10 mg/dL (ref 6–23)
Chloride: 106 mEq/L (ref 96–112)
Potassium: 3.3 mEq/L — ABNORMAL LOW (ref 3.5–5.1)
Sodium: 136 mEq/L (ref 135–145)
Total Bilirubin: 1.3 mg/dL — ABNORMAL HIGH (ref 0.3–1.2)
Total Protein: 5.9 g/dL — ABNORMAL LOW (ref 6.0–8.3)

## 2011-06-02 LAB — DIFFERENTIAL
Basophils Absolute: 0 10*3/uL (ref 0.0–0.1)
Basophils Relative: 0 % (ref 0–1)
Eosinophils Relative: 0 % (ref 0–5)
Lymphocytes Relative: 5 % — ABNORMAL LOW (ref 12–46)
Monocytes Absolute: 0.9 10*3/uL (ref 0.1–1.0)

## 2011-06-02 LAB — LEGIONELLA ANTIGEN, URINE

## 2011-06-03 LAB — DIFFERENTIAL
Eosinophils Absolute: 0.2 10*3/uL (ref 0.0–0.7)
Lymphs Abs: 1.2 10*3/uL (ref 0.7–4.0)
Monocytes Relative: 8 % (ref 3–12)
Neutro Abs: 7.7 10*3/uL (ref 1.7–7.7)
Neutrophils Relative %: 78 % — ABNORMAL HIGH (ref 43–77)

## 2011-06-03 LAB — BASIC METABOLIC PANEL
CO2: 21 mEq/L (ref 19–32)
Chloride: 107 mEq/L (ref 96–112)
Creatinine, Ser: 0.52 mg/dL (ref 0.50–1.35)
Glucose, Bld: 94 mg/dL (ref 70–99)
Sodium: 137 mEq/L (ref 135–145)

## 2011-06-03 LAB — CBC
Hemoglobin: 12 g/dL — ABNORMAL LOW (ref 13.0–17.0)
MCH: 31.7 pg (ref 26.0–34.0)
MCV: 92.6 fL (ref 78.0–100.0)
Platelets: 109 10*3/uL — ABNORMAL LOW (ref 150–400)
RBC: 3.78 MIL/uL — ABNORMAL LOW (ref 4.22–5.81)
WBC: 9.9 10*3/uL (ref 4.0–10.5)

## 2011-06-05 ENCOUNTER — Ambulatory Visit: Payer: MEDICARE | Admitting: Internal Medicine

## 2011-06-07 NOTE — Discharge Summary (Signed)
NAMEJERREN, Jack Noble NO.:  [id number]  MEDICAL RECORD NO.:  [id number]  LOCATION:  1433                         FACILITY:  [facility]  PHYSICIAN:  Kathlen Mody, MD       DATE OF BIRTH:  05/25/1929  DATE OF ADMISSION:  05/30/2011 DATE OF DISCHARGE:  06/05/2011                              DISCHARGE SUMMARY   DISCHARGE DIAGNOSES: 1. Gram-negative bacteremia. 2. Dehydration. 3. Hypokalemia. 4. Elevated liver function tests. 5. Benign prostatic hypertrophy. 6. Hypercholesterolemia. 7. Bronchitis.  DISCHARGE MEDICATIONS: 1. Rocephin 1 g q.24 h for 8 more days. 2. Flomax 0.4 mg 1 tablet daily. 3. Azithromycin 250 mg for 3 more days. 4. Guaifenesin 5 mL q. h p.o. p.r.n. 5. Timolol ophthalmic solution 1 drop daily. 6. Vytorin 10/20 mg 1 tablet q.h.s. 7. Aspirin 81 mg 2 tablets daily. 8. Multivitamin 1 tablet daily. 9. Vitamin D 1 tablet daily. 10.Saw palmetto 1 tablet daily.  LABORATORY DATA:  Pertinent labs:  On admission, the patient had a urinalysis which showed trace leukocytes with many bacteria.  Urine culture was negative.  Lactic acid was 3.4.  Comprehensive metabolic panel showed potassium of 3.3.  Sodium of 134 and glucose of 120.  CBC showed an RBC of 4.16.  Cardiac panel showed a total CK of 316 with normal troponin.  Urine Legionella antigen was negative.  Blood cultures on May 30, 2011, showed E-coli sensitive to ceftriaxone and second set of blood cultures were sent, grew Morganella morganii sensitive to ceftriaxone.  Repeat blood cultures were negative so far.  RADIOLOGY:  The patient had a chest x-ray which showed no evidence of active pulmonary disease.  Ultrasound of the abdomen shows no evidence of biliary dilatation.  A repeat chest x-ray on May 31, 2011, shows no acute disease.  A CT abdomen and pelvis with contrast on June 01, 2011, shows no intrahepatic or extrahepatic ductal dilatation.  No drainable fluid collection or abscess.  More  right and trace left pleural effusions with associated lower lobe atelectasis.  BRIEF HOSPITAL COURSE:  This is a 77 year old gentleman admitted for fever, nausea and vomiting, who was initially admitted for possible urinary tract infection as his urinalysis was abnormal.  He was started on Rocephin.  Urine cultures and blood cultures was sent.  Urine culture did not grow anything.  Blood cultures grew E coli Morganella  sensitive to ceftriaxone.  Since he had gram-negative rod bacteremia, he will be treated with 2 weeks of IV antibiotics.  His repeat cultures were negative.  Infectious Diseases were consulted over the phone and the recommendations were given as above.  Hypokalemia, repleted.  Elevated liver function tests which have improved and CT abdomen and pelvis did not show any intra and extrahepatic biliary dilatation.  BPH.  The patient was complaining of urinary frequency and he was told by his PCP Dr. Alwyn Ren that he has an enlarged prostate.  As per the patient and wife, the patient's PSA was 1.27.  The patient started on Flomax 0.4 mg daily.  Dry cough over the last 1 week.  The patient's chest x-ray did not show any pneumonia.  The patient could have viral  bronchitis versus bacterial bronchitis.  He was started on Z-Pak.  Will continue azithromycin for 3 more days to complete the course.  DISCHARGE PHYSICAL EXAMINATION:  VITAL SIGNS:  On the day of discharge, the patient's vitals include temperature of 97.2, pulse of 82, respirations 18, blood pressure 124/76, saturating 94% on room air. GENERAL:  On exam, he is alert, afebrile, oriented x3, comfortable, no acute distress. CARDIOVASCULAR:  S1, S2 heard. RESPIRATORY EXAM:  Chest clear to auscultation bilaterally. ABDOMEN:  Soft, nontender, nondistended. EXTREMITIES:  No pedal edema.  The patient is hemodynamically stable for discharge.  FOLLOWUP:  Follow up with PCP Dr. Alwyn Ren in about 1 week and also followup  liver function tests at PCP's office.          ______________________________ Kathlen Mody, MD     VA/MEDQ  D:  06/05/2011  T:  06/06/2011  Job:  [id number]  Electronically Signed by Kathlen Mody MD on 06/07/2011 07:59:49 AM

## 2011-06-09 LAB — CULTURE, BLOOD (ROUTINE X 2): Culture: NO GROWTH

## 2011-06-16 ENCOUNTER — Encounter: Payer: Self-pay | Admitting: Internal Medicine

## 2011-06-16 ENCOUNTER — Ambulatory Visit (INDEPENDENT_AMBULATORY_CARE_PROVIDER_SITE_OTHER): Payer: MEDICARE | Admitting: Internal Medicine

## 2011-06-16 VITALS — BP 140/72 | HR 69 | Temp 98.7°F | Wt 156.0 lb

## 2011-06-16 DIAGNOSIS — N400 Enlarged prostate without lower urinary tract symptoms: Secondary | ICD-10-CM

## 2011-06-16 DIAGNOSIS — R7881 Bacteremia: Secondary | ICD-10-CM

## 2011-06-16 DIAGNOSIS — B372 Candidiasis of skin and nail: Secondary | ICD-10-CM

## 2011-06-16 DIAGNOSIS — A419 Sepsis, unspecified organism: Secondary | ICD-10-CM

## 2011-06-16 NOTE — Patient Instructions (Addendum)
Please use sterile saline wet-to-dry dressings for the lesions on the buttocks. After drying, apply Nystatin. Gently blow  this dry with a hair dryer on low heat. Once you feel  strong enough, I would recommend urologic evaluation of prostate simply to be cautious.Stop Saw Palmetto supplement

## 2011-06-16 NOTE — Progress Notes (Signed)
  Subjective:    Patient ID: Jack Noble, male    DOB: 19-Sep-1929, 77 y.o.   MRN: [id number]  HPI Jack Noble presents after being hospitalized for gram-negative bacteremia 7/7-7/13/2012. His initial symptoms were chills followed by fever, nausea, and vomiting. The record was reviewed ; copy given to wife At his physical on 6/14 he was found at 2+ prostatic hypertrophy. PSA was normal at 1.27. He denied dysuria, pyuria, or hematuria. Additionally he had no voiding or incontinence issues other than frequency. Urine culture revealed no growth. He was started on Flomax in the hospital.      Review of Systems at this time he denies fever, chills, or sweats. He has no genitourinary symptoms; specifically he has no frequency at this time. His only complaint is some rectal discomfort; he has not had diarrhea. He describes a rash over the buttocks and scrotal area since 7/18. This has been progressive. Desitin appears to be of some benefit      Objective:   Physical Exam Gen.: He appears slightly weak but healthy and well-nourished in appearance. Alert, appropriate and cooperative throughout exam. Eyes: No corneal or conjunctival inflammation noted. No icterus Mouth: Oral mucosa and oropharynx reveal no lesions or exudates. Teeth in good repair. Lungs: Normal respiratory effort; chest expands symmetrically. Lungs are clear to auscultation without rales, wheezes, or increased work of breathing. Heart: Normal rate and rhythm. Normal S1 and S2. No gallop, click, or rub. No  murmur. Abdomen: Bowel sounds normal; abdomen soft and nontender. No masses, organomegaly . Ventral hernia  noted. Genitalia:  Erythema of scrotum.   .                                                                                   Musculoskeletal/extremities:  No clubbing, cyanosis, edema, or deformity noted.  Nail health  good. Vascular:  radial artery, dorsalis pedis and  posterior tibial pulses are full and equal.    Neurologic: Alert and oriented x3.       Skin:shallow  Ulcers over buttocks. Lymph: No cervical, axillary , or inguinal  lymphadenopathy present. Psych: Mood and affect are normal. Normally interactive                                                                                         Assessment & Plan:  #1 status post bacteremia with gram-negative organisms; presumably from the genitourinary tract despite negative urine culture  #2 benign prostatic hypertrophy  #3 frequency resolved  #4 skin rash, probable candida related to the prospect of antibiotics and moisture.  Plan: #1 Urology  assessment of the prostate status; they wish to defer until stronger #2 wet-to-dry saline dressings to the buttock skin lesions  #3 Nizoral topically to the rash.

## 2011-06-26 ENCOUNTER — Telehealth: Payer: Self-pay | Admitting: Internal Medicine

## 2011-06-26 DIAGNOSIS — N400 Enlarged prostate without lower urinary tract symptoms: Secondary | ICD-10-CM

## 2011-06-26 MED ORDER — TAMSULOSIN HCL 0.4 MG PO CAPS: 0.4000 mg | ORAL_CAPSULE | Freq: Every day | ORAL | Status: DC

## 2011-06-26 NOTE — Telephone Encounter (Signed)
RX sent to pharmacy and referral placed

## 2011-06-26 NOTE — Telephone Encounter (Signed)
Patient was seen [phone] - dr hopper recommended he follow up with urologist  - patient wants to be referred --------- also wants refill for flomax -[facility] [address]

## 2011-07-06 ENCOUNTER — Encounter: Payer: Self-pay | Admitting: Internal Medicine

## 2011-07-06 ENCOUNTER — Ambulatory Visit (INDEPENDENT_AMBULATORY_CARE_PROVIDER_SITE_OTHER): Payer: MEDICARE | Admitting: Internal Medicine

## 2011-07-06 ENCOUNTER — Ambulatory Visit: Payer: MEDICARE | Admitting: Internal Medicine

## 2011-07-06 DIAGNOSIS — R7309 Other abnormal glucose: Secondary | ICD-10-CM

## 2011-07-06 DIAGNOSIS — B3789 Other sites of candidiasis: Secondary | ICD-10-CM

## 2011-07-06 DIAGNOSIS — D649 Anemia, unspecified: Secondary | ICD-10-CM

## 2011-07-06 DIAGNOSIS — B351 Tinea unguium: Secondary | ICD-10-CM

## 2011-07-06 DIAGNOSIS — R7402 Elevation of levels of lactic acid dehydrogenase (LDH): Secondary | ICD-10-CM

## 2011-07-06 LAB — CBC WITH DIFFERENTIAL/PLATELET
Basophils Relative: 0.6 % (ref 0.0–3.0)
Eosinophils Absolute: 0.1 10*3/uL (ref 0.0–0.7)
Hemoglobin: 14.3 g/dL (ref 13.0–17.0)
Lymphs Abs: 1.7 10*3/uL (ref 0.7–4.0)
MCHC: 33.6 g/dL (ref 30.0–36.0)
MCV: 97.1 fl (ref 78.0–100.0)
Monocytes Absolute: 0.6 10*3/uL (ref 0.1–1.0)
Neutro Abs: 5.1 10*3/uL (ref 1.4–7.7)
RBC: 4.38 Mil/uL (ref 4.22–5.81)
RDW: 13.1 % (ref 11.5–14.6)

## 2011-07-06 LAB — HEPATIC FUNCTION PANEL
Albumin: 4.2 g/dL (ref 3.5–5.2)
Alkaline Phosphatase: 43 U/L (ref 39–117)
Total Protein: 7.2 g/dL (ref 6.0–8.3)

## 2011-07-06 LAB — HEMOGLOBIN A1C: Hgb A1c MFr Bld: 5.5 % (ref 4.6–6.5)

## 2011-07-06 NOTE — Patient Instructions (Signed)
Please use a WaterPik over the next several months and then have your teeth cleaned.Please put on socks before putting on  underwear for  the reasons we discussed. Podiatry consultation will be scheduled.

## 2011-07-06 NOTE — Progress Notes (Signed)
  Subjective:    Patient ID: Jack Noble, male    DOB: [birth year], 77 y.o.   MRN: [id number]  HPI #1 RASH: Location: buttocks on L  Onset: present since D/C from hospital 8/13   Course: clearing up slowly Self-treated with: saline wet to dry then Nystatiin              Improvement with treatment: yes but Nystatin caused itching History Pruritis: not now  Tenderness:no Red Flags Feeling ill: no  Fever: no  Mouth lesions: no  Facial/tongue swelling/difficulty breathing: no     Review of Systems     Objective:   Physical Exam he is in no acute distress; he appears well-nourished  Dental hygiene is good without significant plaque formation  He has no lymphadenopathy about the neck or axilla  An S4 is present without significant murmurs. No significant valvular heart disease is suggested  Chest is clear without rhonchi, rales or wheezes  He has no organomegaly; he has a large ventral hernia  Peripheral pulses are excellent. There no ischemic changes in the feet  He does have fungal nail changes of the first 3 toenails of the left foot  Candida changes are noted in the right inguinal area. Is a large plaque-like gland dermatitis 5 x 10 cm of the left buttocks. There is no evidence of cellulitis  He has no inguinal lymphadenopathy.          Assessment & Plan:  #1 rash, Candida suggested  #2 fungal nail changes  #3 significant lab abnormalities documented at hospitalization; labs reviewed and copy provided. These will be rechecked.  Plan: Podiatry referral for fungal nail changes. Vitamin A & D ointment to buttocks  No evidence of valvular heart disease to preclude dental cleansing.

## 2011-07-24 ENCOUNTER — Telehealth: Payer: Self-pay | Admitting: Internal Medicine

## 2011-07-24 ENCOUNTER — Ambulatory Visit (INDEPENDENT_AMBULATORY_CARE_PROVIDER_SITE_OTHER): Payer: MEDICARE | Admitting: Internal Medicine

## 2011-07-24 VITALS — BP 138/72 | HR 76 | Temp 97.8°F | Wt 157.0 lb

## 2011-07-24 DIAGNOSIS — B369 Superficial mycosis, unspecified: Secondary | ICD-10-CM

## 2011-07-24 NOTE — Telephone Encounter (Signed)
Spoke w/ pt wife appt scheduled today at 4:30 to see Hop.

## 2011-07-24 NOTE — Progress Notes (Signed)
  Subjective:    Patient ID: Jack Noble, male    DOB: 19-Sep-1929, 77 y.o.   MRN: [id number]  HPI RASH: Location: L  buttock  Onset: 8/30 Trigger: driving for 45 min, sitting on leather seat with sweating   Course: stable but draining Self-treated with: A&D ointment             Improvement with treatment: itching improved History Tenderness: yes,    Red Flags Feeling ill: no  Fever: no  Mouth lesions: no  Facial/tongue swelling/difficulty breathing:  no  Diabetic or immunocompromised: no    Review of Systems     Objective:   Physical Exam he is in no acute distress  Head no lymphadenopathy about the head, neck, axilla or inguinal area  He has a large ventral hernia but no organomegaly or masses  He has an eczematoid type rash greater on the left but the right.         Assessment & Plan:  #1 dermatitis, Candida  type etiology suggested by the exacerbation with the sweating yesterday.  Plan: See orders and recommendations.

## 2011-07-24 NOTE — Telephone Encounter (Signed)
Sore on buttox was getting better but now it is oozing - he is using a& d ointment - patient has appt [id number] - [facility]

## 2011-07-24 NOTE — Patient Instructions (Signed)
Sit on a wicker type seat  while riding and driving. Use the triamcinolone/nystatin ointment twice a day to the dry irritated areas.

## 2011-07-28 ENCOUNTER — Ambulatory Visit: Payer: MEDICARE | Admitting: Internal Medicine

## 2011-07-28 DIAGNOSIS — Z0289 Encounter for other administrative examinations: Secondary | ICD-10-CM

## 2011-08-03 ENCOUNTER — Encounter: Payer: Self-pay | Admitting: Internal Medicine

## 2011-08-03 ENCOUNTER — Ambulatory Visit (INDEPENDENT_AMBULATORY_CARE_PROVIDER_SITE_OTHER): Payer: MEDICARE | Admitting: Internal Medicine

## 2011-08-03 DIAGNOSIS — R21 Rash and other nonspecific skin eruption: Secondary | ICD-10-CM

## 2011-08-03 DIAGNOSIS — B029 Zoster without complications: Secondary | ICD-10-CM

## 2011-08-03 MED ORDER — FAMCICLOVIR 500 MG PO TABS: 500.0000 mg | ORAL_TABLET | Freq: Three times a day (TID) | ORAL | Status: AC

## 2011-08-03 NOTE — Progress Notes (Signed)
  Subjective:    Patient ID: Jack Noble, male    DOB: 03-Aug-1929, 77 y.o.   MRN: [id number]  HPI RASH: Location: around umbilicus  Onset: 9/08 as single bump   Course: progressive on L Self-treated with: Nystatin               Improvement with treatment: ? some History Pruritis: yes,   Tenderness: yes,   New detergent, new clothing, or other topical exposure: no   Red Flags Feeling ill: no  Fever: no  Mouth lesions: no  Facial/tongue swelling/difficulty breathing:  no     Review of Systems     Objective:   Physical Exam   He appears well and in no acute distress  He is no lymphadenopathy about the neck, axilla  He has no organomegaly or masses; there is a large ventral hernia  He has 5 small faintly erythematous papules around the umbilicus in T-10 ditribution. There are no lesions over the flank or posterior thorax        Assessment & Plan:  #1 rash, papular. Rule out herpetic etiology  Plan: See orders and recommendations.

## 2011-08-04 ENCOUNTER — Other Ambulatory Visit: Payer: Self-pay | Admitting: Internal Medicine

## 2011-08-04 MED ORDER — EZETIMIBE-SIMVASTATIN 10-20 MG PO TABS: ORAL_TABLET | ORAL | Status: DC

## 2011-08-04 NOTE — Telephone Encounter (Signed)
Patient calling, was here to see Dr. Alwyn Ren on 08-03-11, for refill of his Vytorin.  [facility] [address], no refill was submitted.

## 2011-08-04 NOTE — Telephone Encounter (Signed)
RX sent

## 2011-08-14 LAB — COMPREHENSIVE METABOLIC PANEL
AST: 41 — ABNORMAL HIGH
Albumin: 3.7
Calcium: 8.8
Chloride: 100
Creatinine, Ser: 0.77
GFR calc Af Amer: >60
Total Bilirubin: 0.9

## 2011-08-14 LAB — URINALYSIS, ROUTINE W REFLEX MICROSCOPIC
Hgb urine dipstick: NEGATIVE
Nitrite: NEGATIVE
Protein, ur: NEGATIVE
Urobilinogen, UA: 0.2

## 2011-08-14 LAB — DIFFERENTIAL
Eosinophils Relative: 0
Lymphocytes Relative: 2 — ABNORMAL LOW
Lymphs Abs: 0.2 — ABNORMAL LOW
Monocytes Absolute: 0.4

## 2011-08-14 LAB — CBC
MCV: 94.9
Platelets: 193
WBC: 12.7 — ABNORMAL HIGH

## 2011-09-25 ENCOUNTER — Telehealth: Payer: Self-pay | Admitting: Internal Medicine

## 2011-09-25 MED ORDER — TAMSULOSIN HCL 0.4 MG PO CAPS: 0.4000 mg | ORAL_CAPSULE | Freq: Every day | ORAL | Status: DC

## 2011-09-25 NOTE — Telephone Encounter (Signed)
Done

## 2011-09-25 NOTE — Telephone Encounter (Signed)
Refill flomax - [facility]

## 2011-10-26 ENCOUNTER — Encounter: Payer: Self-pay | Admitting: Internal Medicine

## 2011-10-26 ENCOUNTER — Ambulatory Visit (INDEPENDENT_AMBULATORY_CARE_PROVIDER_SITE_OTHER): Payer: MEDICARE | Admitting: Internal Medicine

## 2011-10-26 VITALS — BP 130/86 | HR 65 | Temp 98.5°F | Wt 162.0 lb

## 2011-10-26 DIAGNOSIS — L259 Unspecified contact dermatitis, unspecified cause: Secondary | ICD-10-CM

## 2011-10-26 DIAGNOSIS — L309 Dermatitis, unspecified: Secondary | ICD-10-CM

## 2011-10-26 MED ORDER — MOMETASONE FUROATE 0.1 % EX OINT: TOPICAL_OINTMENT | Freq: Two times a day (BID) | CUTANEOUS | Status: AC

## 2011-10-26 NOTE — Progress Notes (Signed)
  Subjective:    Patient ID: Jack Noble, male    DOB: 1928-11-25, 77 y.o.   MRN: [id number]  HPI RASH: Location: onset  Base R thumb  Onset: 1 week ago   Course: progressing Self-treated with: H2O2; Lubriderm              Improvement with treatment: not really History Pruritis: yes  Tenderness: yes, burning  New medications/antibiotics: no  New detergent or other topical exposure: yes, only goat milk soap Red Flags Feeling ill: no  Fever: no  Mouth lesions: no  Facial/tongue swelling/difficulty breathing:  no  PMH : no eczema  Review of Systems     Objective:   Physical Exam he appears healthy and in no distress  He has no lymphadenopathy about the neck, axilla, or epitrochlear areas.  He is a regular rhythm with an S4 with slight slurring.  Chest is clear with no increased work of breathing or wheezing.  He is an irregular erythematous, dry dermatitis from the lower third of the forearm involving both hands. The rash is worse on the right than the left.        Assessment & Plan:  #1 dermatitis of the hands and forearms; the most likely etiology would be the goat milk soap with its animal protein content. He has switched to [facility]  in the last 24 hours.  Plan: See orders and recommendations.

## 2011-10-26 NOTE — Patient Instructions (Signed)
Mix Eucerin 1 part to Mometazone 1 part and apply  twice a day to itchy area of skin as needed if ointment by itself is not suffficient .

## 2011-11-03 ENCOUNTER — Ambulatory Visit (INDEPENDENT_AMBULATORY_CARE_PROVIDER_SITE_OTHER): Payer: MEDICARE

## 2011-11-03 DIAGNOSIS — Z23 Encounter for immunization: Secondary | ICD-10-CM

## 2011-11-19 ENCOUNTER — Encounter: Payer: Self-pay | Admitting: Internal Medicine

## 2011-11-19 ENCOUNTER — Ambulatory Visit (INDEPENDENT_AMBULATORY_CARE_PROVIDER_SITE_OTHER): Payer: MEDICARE | Admitting: Internal Medicine

## 2011-11-19 DIAGNOSIS — T887XXA Unspecified adverse effect of drug or medicament, initial encounter: Secondary | ICD-10-CM

## 2011-11-19 DIAGNOSIS — R42 Dizziness and giddiness: Secondary | ICD-10-CM

## 2011-11-19 DIAGNOSIS — N400 Enlarged prostate without lower urinary tract symptoms: Secondary | ICD-10-CM

## 2011-11-19 DIAGNOSIS — R361 Hematospermia: Secondary | ICD-10-CM

## 2011-11-19 LAB — POCT URINALYSIS DIPSTICK
Bilirubin, UA: NEGATIVE
Blood, UA: NEGATIVE
Ketones, UA: NEGATIVE
Spec Grav, UA: 1.015
pH, UA: 6.5

## 2011-11-19 MED ORDER — FINASTERIDE 5 MG PO TABS: 5.0000 mg | ORAL_TABLET | Freq: Every day | ORAL | Status: DC

## 2011-11-19 NOTE — Patient Instructions (Addendum)
Stop Flomax ; start Finasteride. Urology referral if hematospermia recurs.

## 2011-11-19 NOTE — Progress Notes (Signed)
  Subjective:    Patient ID: Jack Noble, male    DOB: [birth year], 77 y.o.   MRN: [id number]  HPI Dizziness Onset:when Flomax was started Context:standing up Benign positional vertigo symptoms:no Straining:unrelated Pain:unrelated Cardiac prodrome: not associated with chest pain, palpitations, irregular rhythm, heart rate change Neurologic prodrome:not associated headache, numbness and tingling, weakness, change in coordination (gait/falling) Syncope:no Seizure activity:no Upper respiratory tract infection/extrinsic symptoms:unrelated Duration:constant Frequency:daily, worse during day Associated signs and symptoms: Visual change (blurred/double/loss):no Hearing loss/tinnitus:no Nausea/sweating:no Dyspnea:no Treatment/response:none     Review of Systems Isolated hematospermia X 1      Objective:   Physical Exam  Gen. appearance: Well-nourished, in no distress Eyes: Extraocular motion intact, field of vision normal, vision grossly intact with lenses, no nystagmus ENT: Canals clear, tympanic membranes normal, tuning fork exam normal, hearing grossly normal Neck: slightly decreased  range of motion; no masses, normal thyroid Cardiovascular: Rate and rhythm normal; no murmurs, gallops or extra heart sounds Muscle skeletal:  tone, &  strength normal Neuro:no cranial nerve deficit, deep tendon  reflexes normal, gait normal Lymph: No cervical or axillary LA Skin: Warm and dry without suspicious lesions or rashes. Bandage R temple post Mohs Psych: no anxiety or mood change. Normally interactive and cooperative.         Assessment & Plan:  #1 dizziness; ? Due to Flomax #2 isolated hematospermia Plan: See orders and recommendations

## 2011-11-20 ENCOUNTER — Ambulatory Visit: Payer: MEDICARE | Admitting: Internal Medicine

## 2011-12-01 ENCOUNTER — Other Ambulatory Visit: Payer: Self-pay | Admitting: Internal Medicine

## 2011-12-01 NOTE — Telephone Encounter (Signed)
Generic Flomax 0.4 mg , #90, refill x2 .FYI  The other agent actually shrinks the prostate and may be protective to the prostate in reference to prostate cancer.

## 2011-12-01 NOTE — Telephone Encounter (Signed)
Patient calling, stating he wants to remain on the Flomax, and that due to the side effects of the alternate medication that was offered to him, he plans not to take it, and wants Dr. Alwyn Ren to know that.  Also, states he is almost out of the Flomax, needs refill please to [facility] [address] [address]

## 2011-12-01 NOTE — Telephone Encounter (Addendum)
Spoke with patient, noted in last OV patient with dizziness and thinks it was related to flomax. Patient stated the dizziness went away and he never stopped Flomax after Reading potential side affects to new med he never started.  Patient would like to know if Dr.[name] will re-prescribe Flomax, Dr.[name] please advise

## 2011-12-02 MED ORDER — TAMSULOSIN HCL 0.4 MG PO CAPS: 0.4000 mg | ORAL_CAPSULE | Freq: Every day | ORAL | Status: DC

## 2011-12-02 NOTE — Telephone Encounter (Signed)
Rx Done. Informed patient's wife.

## 2011-12-21 ENCOUNTER — Ambulatory Visit (INDEPENDENT_AMBULATORY_CARE_PROVIDER_SITE_OTHER): Payer: MEDICARE | Admitting: Internal Medicine

## 2011-12-21 ENCOUNTER — Ambulatory Visit: Payer: MEDICARE | Admitting: Internal Medicine

## 2011-12-21 ENCOUNTER — Encounter: Payer: Self-pay | Admitting: Internal Medicine

## 2011-12-21 DIAGNOSIS — R5381 Other malaise: Secondary | ICD-10-CM | POA: Diagnosis not present

## 2011-12-21 DIAGNOSIS — I10 Essential (primary) hypertension: Secondary | ICD-10-CM | POA: Diagnosis not present

## 2011-12-21 DIAGNOSIS — R531 Weakness: Secondary | ICD-10-CM

## 2011-12-21 DIAGNOSIS — R5383 Other fatigue: Secondary | ICD-10-CM | POA: Diagnosis not present

## 2011-12-21 MED ORDER — METOPROLOL TARTRATE 25 MG PO TABS: ORAL_TABLET | ORAL | Status: DC

## 2011-12-21 NOTE — Patient Instructions (Signed)
Blood Pressure Goal  Ideally is an AVERAGE < 135/85. This AVERAGE should be calculated from @ least 5-7 BP readings taken @ different times of day on different days of week. You should not respond to isolated BP readings , but rather the AVERAGE for that week .  His blood pressure averages over 140/90; please fill the prescription for metoprolol 25 mg one half twice a day.

## 2011-12-21 NOTE — Progress Notes (Signed)
  Subjective:    Patient ID: Jack Noble, male    DOB: 07-26-1929, 78 y.o.   MRN: [id number]  HPI Blood pressure had been elevated in the middle of this month with values of 155/71 to a high of 175/82. In the last week they have improved. In the last 5 days BP has in the range of 131-137/61-76.  He is taking the generic Flomax. He has chosen not to take generic Proscar.  He denies increased salt intake. He questions whether caffeine may have been a trigger for his hypertension.  He does describe being weak recently; he denies abdominal pain, melena, rectal bleeding.    Review of Systems     Objective:   Physical Exam  He is in no acute distress appears well-nourished.  Heart rhythm is regular with a grade 1 systolic murmur.  Chest is clear with no increase work of breathing or abnormal breath sounds.  Has no pedal edema, cyanosis or clubbing        Assessment & Plan:    #1 recent increase in blood pressure; possibly related to increased caffeine intake. If blood pressure stays at an average rate of 140/90; I would recommend low-dose beta blocker such as metoprolol 25 mg one half every 12 hours.  #2 He describes weakness; labs will be checked

## 2011-12-22 LAB — CBC WITH DIFFERENTIAL/PLATELET
Basophils Relative: 0.5 % (ref 0.0–3.0)
Eosinophils Absolute: 0.2 10*3/uL (ref 0.0–0.7)
Lymphs Abs: 1.5 10*3/uL (ref 0.7–4.0)
MCHC: 33.8 g/dL (ref 30.0–36.0)
MCV: 99.3 fl (ref 78.0–100.0)
Monocytes Absolute: 0.4 10*3/uL (ref 0.1–1.0)
Neutrophils Relative %: 58.9 % (ref 43.0–77.0)
Platelets: 175 10*3/uL (ref 150.0–400.0)

## 2011-12-22 LAB — BASIC METABOLIC PANEL
BUN: 14 mg/dL (ref 6–23)
CO2: 28 mEq/L (ref 19–32)
Chloride: 104 mEq/L (ref 96–112)
Creatinine, Ser: 0.6 mg/dL (ref 0.4–1.5)

## 2011-12-23 LAB — HEMOGLOBIN A1C: Hgb A1c MFr Bld: 5.5 % (ref 4.6–6.5)

## 2012-01-04 DIAGNOSIS — H4050X0 Glaucoma secondary to other eye disorders, unspecified eye, stage unspecified: Secondary | ICD-10-CM | POA: Diagnosis not present

## 2012-02-22 ENCOUNTER — Encounter: Payer: Self-pay | Admitting: Internal Medicine

## 2012-02-22 ENCOUNTER — Ambulatory Visit (INDEPENDENT_AMBULATORY_CARE_PROVIDER_SITE_OTHER): Payer: MEDICARE | Admitting: Internal Medicine

## 2012-02-22 VITALS — BP 130/68 | HR 69 | Temp 98.3°F | Wt 157.0 lb

## 2012-02-22 DIAGNOSIS — R1031 Right lower quadrant pain: Secondary | ICD-10-CM

## 2012-02-22 DIAGNOSIS — R109 Unspecified abdominal pain: Secondary | ICD-10-CM | POA: Diagnosis not present

## 2012-02-22 LAB — CBC WITH DIFFERENTIAL/PLATELET
Basophils Relative: 0.5 % (ref 0.0–3.0)
Eosinophils Absolute: 0.1 10*3/uL (ref 0.0–0.7)
Hemoglobin: 13 g/dL (ref 13.0–17.0)
Lymphocytes Relative: 19.1 % (ref 12.0–46.0)
MCHC: 32.8 g/dL (ref 30.0–36.0)
MCV: 98.8 fl (ref 78.0–100.0)
Neutro Abs: 4.3 10*3/uL (ref 1.4–7.7)
RBC: 4.01 Mil/uL — ABNORMAL LOW (ref 4.22–5.81)

## 2012-02-22 LAB — POCT URINALYSIS DIPSTICK
Bilirubin, UA: NEGATIVE
Glucose, UA: NEGATIVE
Ketones, UA: NEGATIVE
Protein, UA: NEGATIVE
Spec Grav, UA: 1.01

## 2012-02-22 MED ORDER — HYOSCYAMINE SULFATE 0.125 MG SL SUBL: 0.1250 mg | SUBLINGUAL_TABLET | SUBLINGUAL | Status: DC | PRN

## 2012-02-22 NOTE — Patient Instructions (Signed)
Stay on clear liquids if having significant pain.This would include  jello, sherbert (NOT ice cream), Lipton's chicken noodle soup(NOT cream based soups),Gatorade Lite, flat Ginger ale (without High Fructose Corn Syrup),dry toast or crackers, baked potato.No milk , dairy or grease until bowels are formed. Align , a [employer] , daily if stools are loose. Immodium AD for frankly watery stool. Report increasing pain, fever or rectal bleeding

## 2012-02-22 NOTE — Progress Notes (Signed)
  Subjective:    Patient ID: Jack Noble, male    DOB: Feb 03, 1929, 78 y.o.   MRN: [id number]  HPI ABDOMINAL PAIN Onset:02/13/12  Location: both lowe quadrants Radiation: no  Severity: up to 8; now LLQ soreness Quality: cramping Duration: constant until 3/27, now intermittent  Better with: Tylenol , Mylanta Worse with: no triggers Symptoms Nausea/Vomiting: no  Diarrhea: no Constipation: no Melena/BRBPR: no Hematemesis: no  Anorexia: slightly  Fever/Chills: no  Wt loss:no NSAIDs/ASA: no Past Surgeries: Colonoscopy has revealed diverticulosis and a miniscule polyp. In July 2012 blood cultures were positive for Morganella as well as Escherichia coli. Both organisms were sensitive to ceftriaxone      Review of Systems  Dysuria/hematuria/pyuria: no Rash: no  Night sweats : intermittently     Objective:   Physical Exam General appearance: thin but in good health and nourishment w/o distress.  Eyes: No conjunctival inflammation or scleral icterus is present.  Oral exam: Dental hygiene is good; lips and gums are healthy appearing.There is no oropharyngeal erythema or exudate noted.   Heart:  Normal rate and regular rhythm. S1 and S2 normal without gallop,  click, rub or other extra sounds  .Grade 1/2- 1 over 6 systolic murmur    Lungs:Chest clear to auscultation; no wheezes, rhonchi,rales ,or rubs present.No increased work of breathing.   Abdomen: bowel sounds normal, soft but slightly tender  LLQ without masses, or organomegaly . Incisional RUQ  Hernia noted.  No guarding or rebound   Skin:Warm & dry.  Intact without suspicious lesions or rashes ; no jaundice or tenting  Lymphatic: No lymphadenopathy is noted about the head, neck, axilla, or inguinal areas.   There is asymmetry of the lower quadrant subcutaneous tissue, left greater than right. Genital exam is negative with no evidence of indirect or direct hernias.             Assessment & Plan:  #1  abdominal pain, both lower quadrants. By history this appears to be improving.  #2  history of diverticulosis; clinically there is no diverticulitis at this time.  #3 history of miniscule colon polyp  #4 past history July 2000 while blood cultures positive for Escherichia coli and Morganella

## 2012-02-22 NOTE — Progress Notes (Signed)
Addended byMarga Melnick F on: 02/22/2012 01:17 PM   Modules accepted: Orders

## 2012-03-02 ENCOUNTER — Other Ambulatory Visit: Payer: Self-pay | Admitting: Internal Medicine

## 2012-03-02 ENCOUNTER — Other Ambulatory Visit (INDEPENDENT_AMBULATORY_CARE_PROVIDER_SITE_OTHER): Payer: MEDICARE

## 2012-03-02 DIAGNOSIS — R109 Unspecified abdominal pain: Secondary | ICD-10-CM | POA: Diagnosis not present

## 2012-03-02 DIAGNOSIS — IMO0002 Reserved for concepts with insufficient information to code with codable children: Secondary | ICD-10-CM | POA: Insufficient documentation

## 2012-03-02 LAB — SEDIMENTATION RATE: Sed Rate: 12 mm/hr (ref 0–22)

## 2012-03-04 ENCOUNTER — Telehealth: Payer: Self-pay

## 2012-03-04 NOTE — Telephone Encounter (Signed)
Left message on voicemail with results, patient to call if questions or concerns

## 2012-03-04 NOTE — Telephone Encounter (Signed)
Message copied by Maurice Small on Fri Mar 04, 2012  4:58 PM ------      Message from: Pecola Lawless      Created: Wed Mar 02, 2012  6:35 PM       Sedimentation rate is a non specific test which is elevated with any inflammatory process; it has returned to normal. [employer]

## 2012-03-16 DIAGNOSIS — D044 Carcinoma in situ of skin of scalp and neck: Secondary | ICD-10-CM | POA: Diagnosis not present

## 2012-04-21 ENCOUNTER — Telehealth: Payer: Self-pay | Admitting: Internal Medicine

## 2012-04-21 NOTE — Telephone Encounter (Signed)
Caller: Margaret/Spouse; PCP: [name]; CB#: [phone];UJWJ regarding BP 165/77; got upset over air conditioner being broken this AM right before BP was taken.  B/P this AM was 155/077, and then after lunch it was 165/077. Last BP taken 04/14/12 was 139/069.  States he is feeling fine, no other sxs.  All emergent sxs r/o per "Hypertension, Diagnosed or Suspected" protocol.  Home/Self Care advice given.  To monitor BP over the next few days and call back if BP is consistently high or if other sxs develop.  Verbalizes understanding.

## 2012-04-28 ENCOUNTER — Ambulatory Visit (INDEPENDENT_AMBULATORY_CARE_PROVIDER_SITE_OTHER): Payer: MEDICARE | Admitting: Internal Medicine

## 2012-04-28 VITALS — BP 154/80 | HR 69 | Temp 98.1°F | Wt 159.0 lb

## 2012-04-28 DIAGNOSIS — I10 Essential (primary) hypertension: Secondary | ICD-10-CM

## 2012-04-28 MED ORDER — METOPROLOL TARTRATE 25 MG PO TABS: ORAL_TABLET | ORAL | Status: DC

## 2012-04-28 MED ORDER — LORAZEPAM 0.5 MG PO TABS: 0.5000 mg | ORAL_TABLET | Freq: Three times a day (TID) | ORAL | Status: AC | PRN

## 2012-04-28 NOTE — Progress Notes (Signed)
  Subjective:    Patient ID: Jack Noble, male    DOB: [birth year], 78 y.o.   MRN: [id number]  HPI As the event  which honors their son  & them for their generosity;the emotional  stress  has been associated with elevation of blood pressure. Blood pressures range from 139/74-166/85.    Review of Systems Chest pain: no   Dyspnea: no   Claudication: no  Medication compliance:  No meds; his medication list includes metoprolol 25 mg one half twice a day if blood pressure is elevated. He is not remember seeing this prescription Lightheadedness: no Urinary frequency: due to Flomax Edema: no   Preventitive Healthcare:  Exercise: walking 45-60 min daily  Diet Pattern: no  Salt Restriction: yes       Objective:   Physical Exam He appears healthy and well-nourished; he is in no acute distress  No carotid bruits are present.  Heart rhythm and rate are normal with no significant murmurs or gallops.  Chest is clear with no increased work of breathing  There is no evidence of aortic aneurysm or renal artery bruits. Incisional hernia  He has no clubbing ; trace ankle edema.   Pedal pulses are intact   No ischemic skin changes are present         Assessment & Plan:   #1 elevated hypertension in the context of exogenous stress.  Plan: See orders and recommendations

## 2012-04-28 NOTE — Patient Instructions (Signed)
Blood Pressure Goal  Ideally is an AVERAGE <  140/90 , ideally <135/85. This AVERAGE should be calculated from @ least 5-7 BP readings taken @ different times of day on different days of week. You should not respond to isolated BP readings , but rather the AVERAGE for that week

## 2012-05-09 ENCOUNTER — Encounter: Payer: MEDICARE | Admitting: Internal Medicine

## 2012-05-17 ENCOUNTER — Ambulatory Visit (INDEPENDENT_AMBULATORY_CARE_PROVIDER_SITE_OTHER): Payer: MEDICARE | Admitting: Internal Medicine

## 2012-05-17 ENCOUNTER — Encounter: Payer: Self-pay | Admitting: Internal Medicine

## 2012-05-17 VITALS — BP 146/80 | HR 60 | Temp 97.8°F | Resp 12 | Ht 66.0 in | Wt 159.2 lb

## 2012-05-17 DIAGNOSIS — N400 Enlarged prostate without lower urinary tract symptoms: Secondary | ICD-10-CM | POA: Diagnosis not present

## 2012-05-17 DIAGNOSIS — E785 Hyperlipidemia, unspecified: Secondary | ICD-10-CM | POA: Diagnosis not present

## 2012-05-17 DIAGNOSIS — Z0000 Encounter for general adult medical examination without abnormal findings: Secondary | ICD-10-CM | POA: Diagnosis not present

## 2012-05-17 DIAGNOSIS — R030 Elevated blood-pressure reading, without diagnosis of hypertension: Secondary | ICD-10-CM

## 2012-05-17 LAB — HEPATIC FUNCTION PANEL
ALT: 20 U/L (ref 0–53)
AST: 23 U/L (ref 0–37)
Alkaline Phosphatase: 33 U/L — ABNORMAL LOW (ref 39–117)
Bilirubin, Direct: 0.1 mg/dL (ref 0.0–0.3)
Total Protein: 6.9 g/dL (ref 6.0–8.3)

## 2012-05-17 LAB — BASIC METABOLIC PANEL
CO2: 29 mEq/L (ref 19–32)
Chloride: 103 mEq/L (ref 96–112)
Potassium: 4.2 mEq/L (ref 3.5–5.1)
Sodium: 139 mEq/L (ref 135–145)

## 2012-05-17 LAB — LIPID PANEL
LDL Cholesterol: 71 mg/dL (ref 0–99)
Total CHOL/HDL Ratio: 3

## 2012-05-17 NOTE — Patient Instructions (Addendum)
Preventive Health Care: Exercise at least 30-45 minutes a day,  3-4 days a week.  Eat a low-fat diet with lots of fruits and vegetables, up to 7-9 servings per day. Consume less than 40 grams of sugar per day from foods & drinks with High Fructose Corn Sugar as # 1,2,3 or # 4 on label. Blood Pressure Goal  Ideally is an AVERAGE < 135/85. This AVERAGE should be calculated from @ least 5-7 BP readings taken @ different times of day on different days of week. You should not respond to isolated BP readings , but rather the AVERAGE for that week   

## 2012-05-17 NOTE — Progress Notes (Signed)
Subjective:    Patient ID: Jack Noble, male    DOB: March 01, 1929, 78 y.o.   MRN: [id number]  HPI Medicare Wellness Visit:  The following psychosocial & medical history were reviewed as required by Medicare.   Social history: caffeine: 1 cup/ day , alcohol:  no ,  tobacco use : never  & exercise : walking 1 mpd  > 3 X/week  Home & personal  safety / fall risk: no issues, activities of daily living: no limitations , seatbelt use :yes , and smoke alarm employment : yes .  Power of Attorney/Living Will status : yes  Vision ( as recorded per Nurse) & Hearing  evaluation : [facility]; see hearing exam. Orientation :oriented X3 , memory & recall :good,  math testing:good,and mood & affect : normal . Depression / anxiety: denied Travel history : [place] , immunization status :Shingles needed , transfusion history:  no, and preventive health surveillance ( colonoscopies, BMD , etc as per protocol/ [facility]): colonoscopy up to date, Dental care:  Seen every 6 mos . Chart reviewed &  Updated. Active issues reviewed & addressed.       Review of Systems HYPERTENSION: Disease Monitoring: Blood pressure range-list @ home  Chest pain, palpitations- no       Dyspnea- no Medications: Compliance- he is not taking the beta blocker ;he has Rx Lightheadedness,Syncope- no    Edema- no  HYPERLIPIDEMIA: Disease Monitoring: See symptoms for Hypertension Medications: Compliance- yes  Abd pain, bowel changes- no   Muscle aches- no; occasional nocturnal cramps in legs          Objective:   Physical Exam Gen.: Healthy and well-nourished in appearance. Alert, appropriate and cooperative throughout exam. Head: Normocephalic without obvious abnormalities;  pattern alopecia  Eyes: No corneal or conjunctival inflammation noted.  Extraocular motion intact. Vision slightly decreased OD even with lenses. Ears: External  ear exam reveals no significant lesions or deformities. Canals clear .TMs normal.  Hearing is grossly normal bilaterally. Nose: External nasal exam reveals no deformity or inflammation. Nasal mucosa are pink and moist. No lesions or exudates noted.   Mouth: Oral mucosa and oropharynx reveal no lesions or exudates. Teeth in good repair. Neck: No deformities, masses, or tenderness noted. Range of motion slightly decreased. Thyroid normal. Lungs: Normal respiratory effort; chest expands symmetrically. Lungs are clear to auscultation without rales, wheezes, or increased work of breathing. Heart: Normal rate and rhythm. Normal S1 and S2. No gallop, click, or rub. S4 w/o murmur. Abdomen: Bowel sounds normal; abdomen soft and nontender. No masses, organomegaly or hernias noted. Genitalia/ DRE:  [facility] 07/24/11                                                     Musculoskeletal/extremities: No deformity or scoliosis noted of  the thoracic or lumbar spine; but there is minimal  asymmetry of the posterior thoracic musculature suggesting occult scoliosis.  . No clubbing, cyanosis, edema, or deformity noted. Range of motion  normal .Tone & strength  normal.Joints normal. Nail health  good. Vascular: Carotid, radial artery, dorsalis pedis and  posterior tibial pulses are full and equal. No bruits present. Neurologic: Alert and oriented x3. Deep tendon reflexes symmetrical and normal.          Skin: Intact without suspicious lesions or rashes. Keratoses Lymph:  No cervical, axillary lymphadenopathy present. Psych: Mood and affect are normal. Normally interactive                                                                                         Assessment & Plan:  #1 Medicare Wellness Exam; criteria met ; data entered #2 Problem List reviewed ; Assessment/ Recommendations made Plan: see Orders

## 2012-05-24 ENCOUNTER — Encounter: Payer: Self-pay | Admitting: *Deleted

## 2012-06-03 ENCOUNTER — Encounter: Payer: Self-pay | Admitting: Internal Medicine

## 2012-06-03 ENCOUNTER — Ambulatory Visit (INDEPENDENT_AMBULATORY_CARE_PROVIDER_SITE_OTHER): Payer: MEDICARE | Admitting: Internal Medicine

## 2012-06-03 VITALS — BP 136/78 | HR 77 | Temp 98.5°F | Wt 160.0 lb

## 2012-06-03 DIAGNOSIS — J329 Chronic sinusitis, unspecified: Secondary | ICD-10-CM | POA: Diagnosis not present

## 2012-06-03 MED ORDER — AZELASTINE HCL 0.1 % NA SOLN: 2.0000 | Freq: Two times a day (BID) | NASAL | Status: DC

## 2012-06-03 MED ORDER — AMOXICILLIN 500 MG PO CAPS: 1000.0000 mg | ORAL_CAPSULE | Freq: Two times a day (BID) | ORAL | Status: AC

## 2012-06-03 NOTE — Patient Instructions (Addendum)
Rest, fluids , tylenol For cough, take Mucinex DM twice a day as needed  For congestion use astelin nasal spray twice a day until you feel better Take the antibiotic as prescribed  (Amoxicillin) Call if no better in few days Call anytime if the symptoms are severe 

## 2012-06-03 NOTE — Progress Notes (Signed)
  Subjective:    Patient ID: Jack Noble, male    DOB: 08-25-1929, 78 y.o.   MRN: [id number]  HPI Acute visit One-week history of sore throat and sinus congestion. Some raspy voice today. Also   right ear ache, sharp and intense at times. Denies any discharge from the ear. Left ear is normal. Wife has respiratory symptoms for the last 3 weeks, she has been seen today here as well  Past Medical History  Diagnosis Date  . Dermatophytosis of nail   . Hyperlipidemia   . Hearing loss   . Hyperplasia of prostate without urinary obstruction   . Cataracts, bilateral   . Rosacea     Dr. Jason Nest  . Allergy   . Glaucoma     Dr. Lottie Dawson  . Corneal abrasion     Dr. Janean Sark, [facility]  . Sepsis due to Escherichia coli (E. coli) 05/2011    Morganella also cultured  . Diverticulosis      Review of Systems Postnasal dripping present. No fever or chills.  Also reports cough which is dry.     Objective:   Physical Exam  General -- alert, well-developed HEENT -- TMs bulge B, R>L, no red , no d/c; mastoid area is nontender to percussion, throat w/o redness, face symmetric and not tender to palpation, nose moderately  congested   Lungs -- normal respiratory effort, no intercostal retractions, no accessory muscle use, very few rhonchi otherwise normal Heart-- normal rate, regular rhythm, no murmur, and no gallop.   Extremities-- no pretibial edema bilaterally  Psych-- Cognition and judgment appear intact. Alert and cooperative with normal attention span and concentration.  not anxious appearing and not depressed appearing.      Assessment & Plan:   Symptoms consistent with sinusitis, see instructions.

## 2012-06-13 ENCOUNTER — Telehealth: Payer: Self-pay | Admitting: Internal Medicine

## 2012-06-13 NOTE — Telephone Encounter (Signed)
Take the amoxicillin only after meals 1 twice a day.Zicam Melts or Zinc lozenges as needed for sore throat. Report fever, exudate("pus") or progressive pain. Please take the probiotic , Align, every day if bowels loose. This will replace the normal bacteria which  are necessary for formation of normal stool and processing of food.

## 2012-06-13 NOTE — Telephone Encounter (Signed)
Discuss with patient  

## 2012-06-13 NOTE — Telephone Encounter (Signed)
°  Caller: Kris/Patient; PCP: [name]; CB#: [phone]; ; ; Call regarding Med Question With Sore Throat.  Pt Was seen by Dr. Drue Novel 06/03/12 for sore thorat/URI and given Amoxicillin 500 mg 2 PO BID.  He read the inst incorrectly and has only been taking 1 PO BID.  His SX are improved and he is afebrile.  He state the Amoxicillin "is hard on my stomach".  What does he need to do now?  May he stop the med or continue and if he needs to continue how does he need to take?    PLEASE CALL TO [facility].

## 2012-06-14 ENCOUNTER — Other Ambulatory Visit: Payer: Self-pay | Admitting: Internal Medicine

## 2012-07-07 IMAGING — CT CT HEAD W/O CM
1 of 2 series · 13 of 30 positions shown, 17 images · non-contrast
Comparison: None.

CLINICAL DATA: Dizziness

CT HEAD WITHOUT CONTRAST
TECHNIQUE: Contiguous axial images were obtained from the base of
the skull through the vertex without contrast.

[Series 2: brain · axial · 0.47mm/px · z∈[+133,+265]mm · 13 of 32 slices shown, 17 images]
[im 3/32  brain]
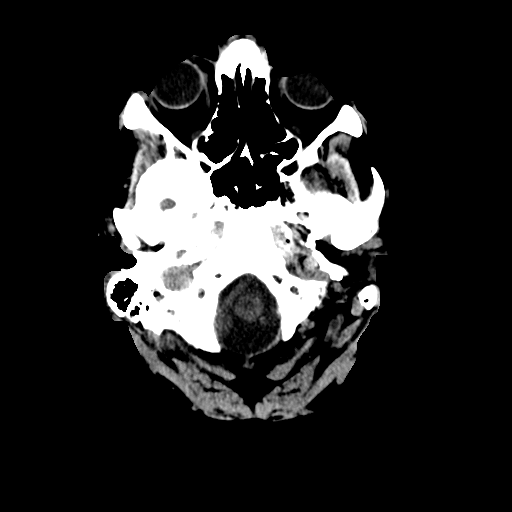
[im 3/32  bone]
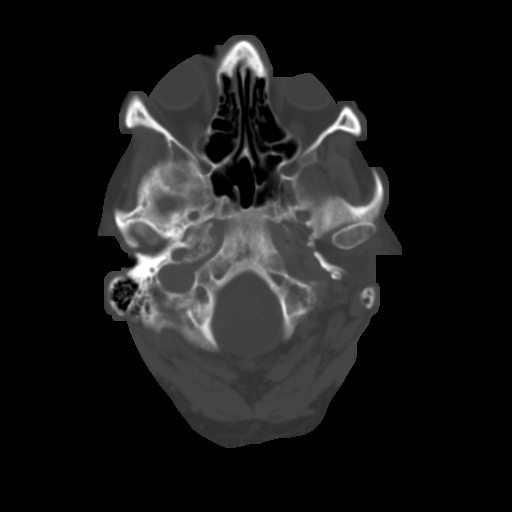
[im 5/32  brain]
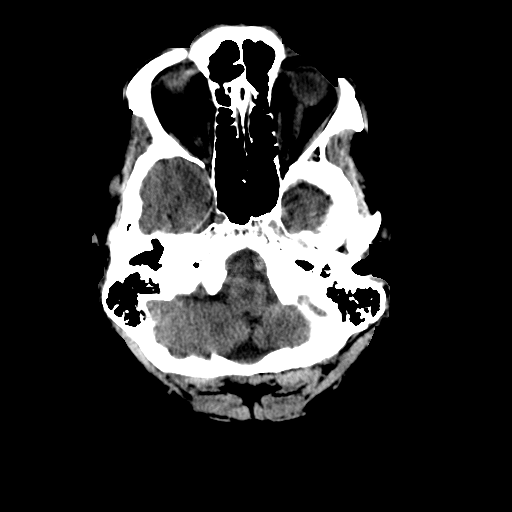
[im 7/32  brain]
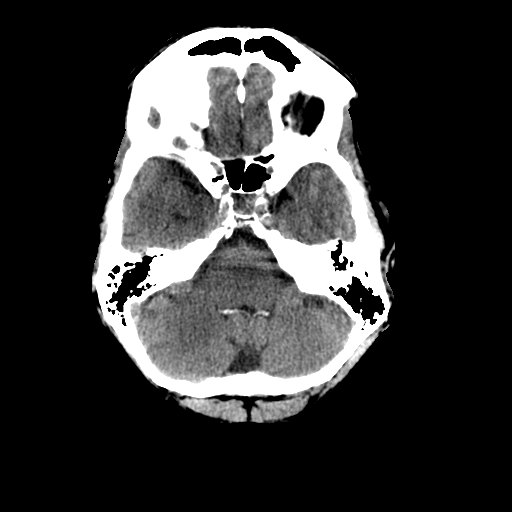
[im 9/32  brain]
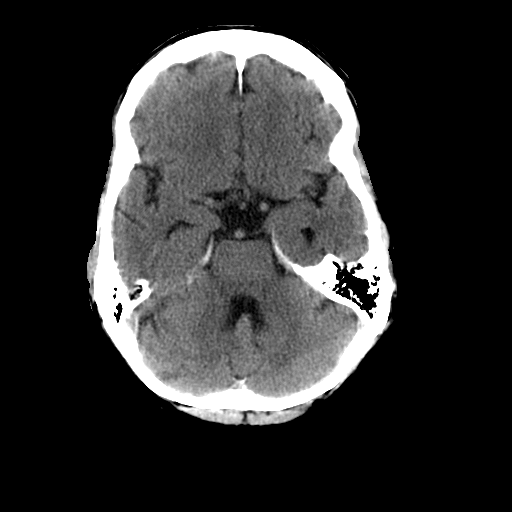
[im 12/32  brain]
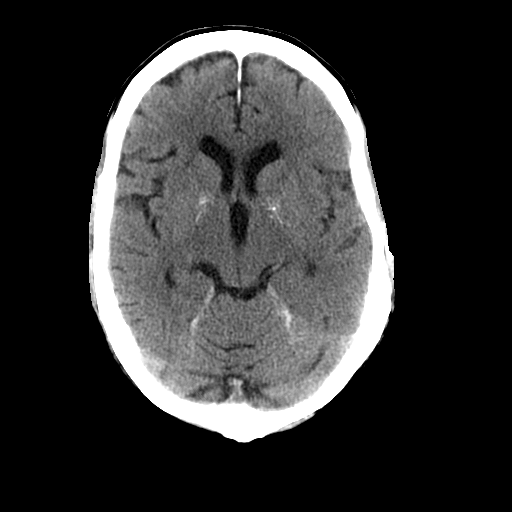
[im 12/32  bone]
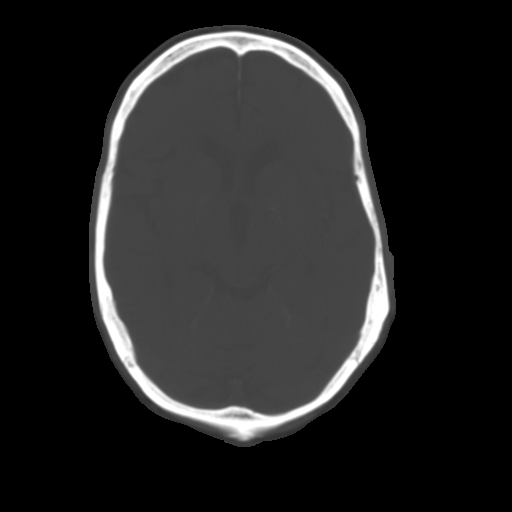
[im 14/32  brain]
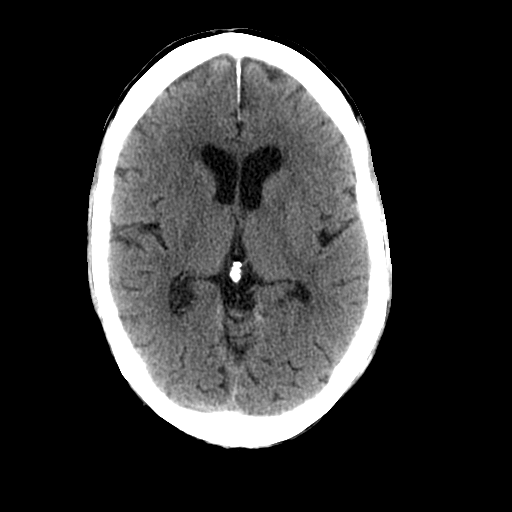
[im 16/32  brain]
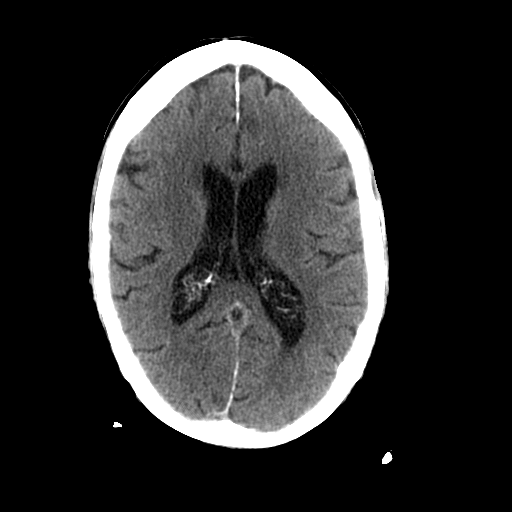
[im 18/32  brain]
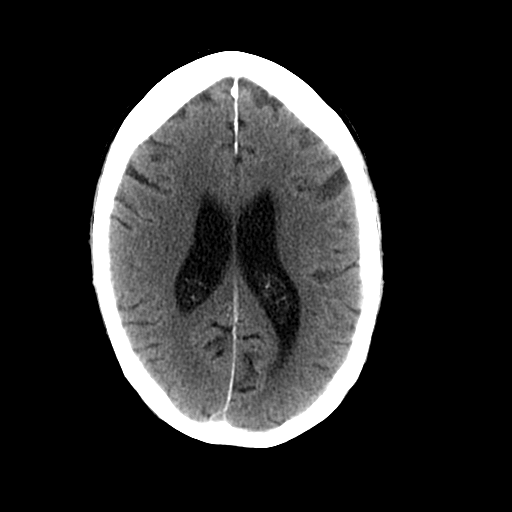
[im 20/32  brain]
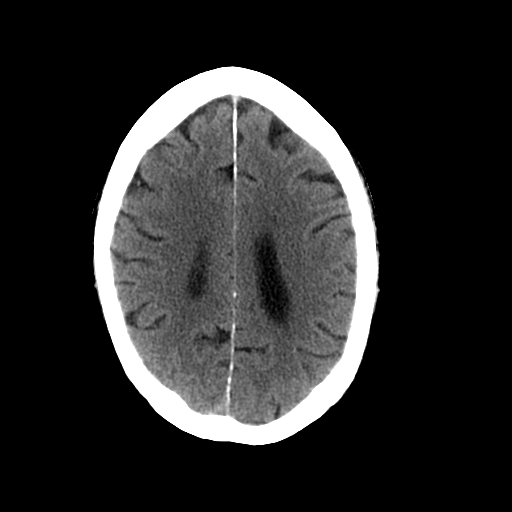
[im 20/32  bone]
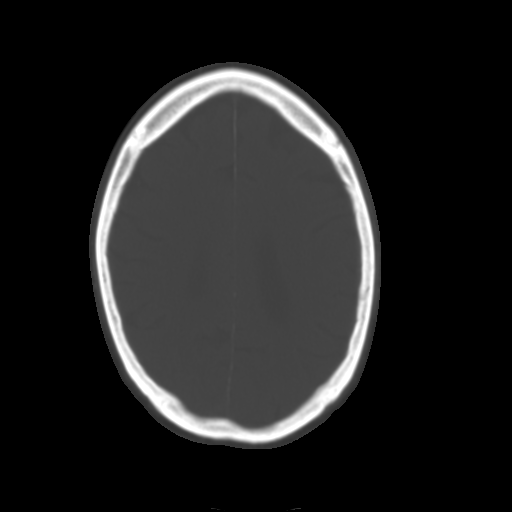
[im 23/32  brain]
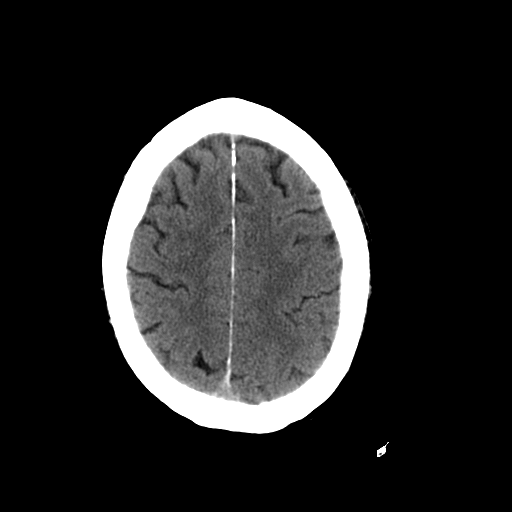
[im 25/32  brain]
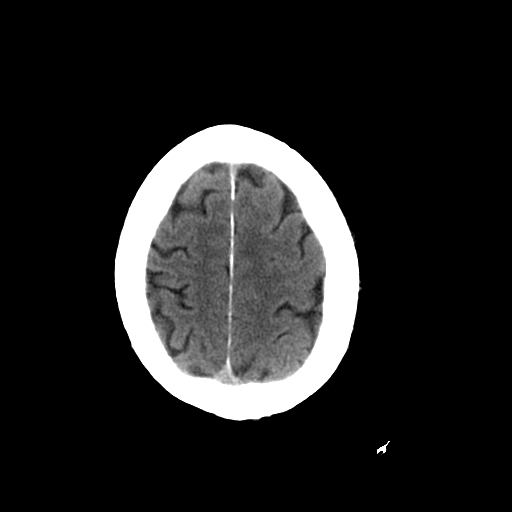
[im 27/32  brain]
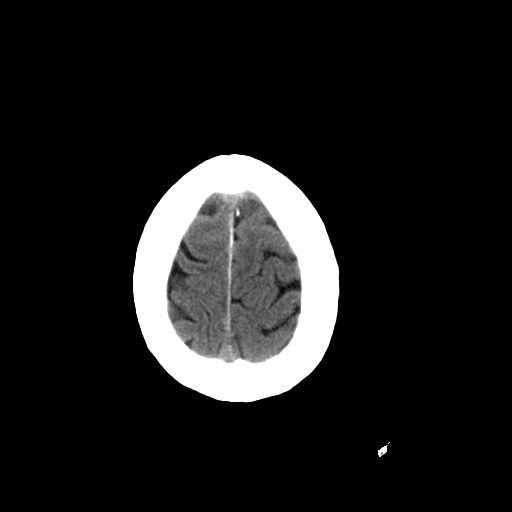
[im 29/32  brain]
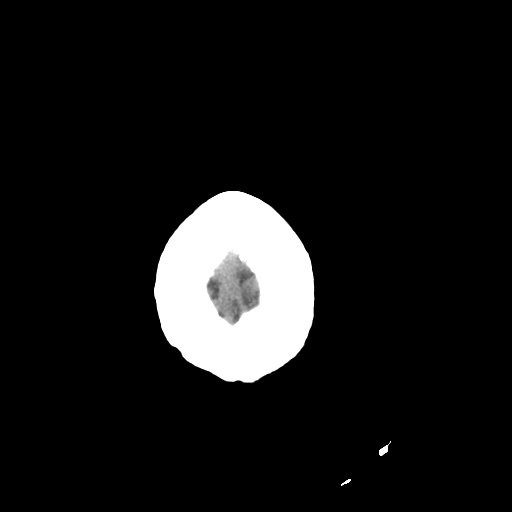
[im 29/32  bone]
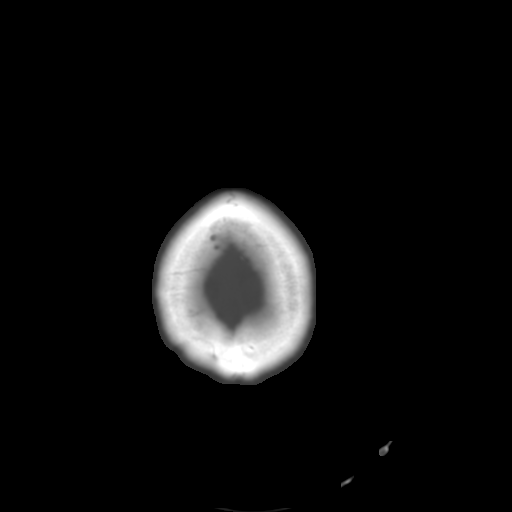

[13 of 30 positions shown; findings below may reference images not displayed]

FINDINGS: There is no evidence for acute hemorrhage, hydrocephalus,
mass lesion, or abnormal extra-axial fluid collection.  No definite
CT evidence for acute infarction.  Patchy low attenuation in the
deep hemispheric and periventricular white matter is nonspecific,
but likely reflects chronic microvascular ischemic demyelination.
Mineralization of the tentorium is evident. The visualized
paranasal sinuses and mastoid air cells are clear.
IMPRESSION: No acute intracranial abnormality.

Chronic small vessel white matter disease.

## 2012-07-29 DIAGNOSIS — R5383 Other fatigue: Secondary | ICD-10-CM | POA: Diagnosis not present

## 2012-07-29 DIAGNOSIS — R6889 Other general symptoms and signs: Secondary | ICD-10-CM | POA: Diagnosis not present

## 2012-07-30 ENCOUNTER — Emergency Department (HOSPITAL_COMMUNITY): Payer: MEDICARE

## 2012-07-30 ENCOUNTER — Encounter (HOSPITAL_COMMUNITY): Payer: Self-pay | Admitting: Emergency Medicine

## 2012-07-30 ENCOUNTER — Other Ambulatory Visit (HOSPITAL_COMMUNITY): Payer: MEDICARE

## 2012-07-30 ENCOUNTER — Observation Stay (HOSPITAL_COMMUNITY)
Admission: EM | Admit: 2012-07-30 | Discharge: 2012-07-30 | Disposition: A | Discharge status: Home / Self Care | Payer: MEDICARE | Attending: Emergency Medicine | Admitting: Emergency Medicine

## 2012-07-30 DIAGNOSIS — L309 Dermatitis, unspecified: Secondary | ICD-10-CM

## 2012-07-30 DIAGNOSIS — R531 Weakness: Secondary | ICD-10-CM

## 2012-07-30 DIAGNOSIS — E785 Hyperlipidemia, unspecified: Secondary | ICD-10-CM | POA: Diagnosis not present

## 2012-07-30 DIAGNOSIS — R5383 Other fatigue: Secondary | ICD-10-CM | POA: Insufficient documentation

## 2012-07-30 DIAGNOSIS — R42 Dizziness and giddiness: Secondary | ICD-10-CM | POA: Insufficient documentation

## 2012-07-30 DIAGNOSIS — L259 Unspecified contact dermatitis, unspecified cause: Secondary | ICD-10-CM | POA: Diagnosis not present

## 2012-07-30 DIAGNOSIS — H269 Unspecified cataract: Secondary | ICD-10-CM | POA: Insufficient documentation

## 2012-07-30 DIAGNOSIS — R5381 Other malaise: Secondary | ICD-10-CM | POA: Diagnosis not present

## 2012-07-30 DIAGNOSIS — R002 Palpitations: Secondary | ICD-10-CM | POA: Diagnosis not present

## 2012-07-30 DIAGNOSIS — R030 Elevated blood-pressure reading, without diagnosis of hypertension: Secondary | ICD-10-CM | POA: Diagnosis present

## 2012-07-30 DIAGNOSIS — I10 Essential (primary) hypertension: Secondary | ICD-10-CM | POA: Insufficient documentation

## 2012-07-30 DIAGNOSIS — G459 Transient cerebral ischemic attack, unspecified: Secondary | ICD-10-CM | POA: Diagnosis not present

## 2012-07-30 DIAGNOSIS — N400 Enlarged prostate without lower urinary tract symptoms: Secondary | ICD-10-CM | POA: Diagnosis not present

## 2012-07-30 DIAGNOSIS — G319 Degenerative disease of nervous system, unspecified: Secondary | ICD-10-CM | POA: Diagnosis not present

## 2012-07-30 LAB — URINALYSIS, ROUTINE W REFLEX MICROSCOPIC
Ketones, ur: NEGATIVE mg/dL
Leukocytes, UA: NEGATIVE
Nitrite: NEGATIVE
Protein, ur: NEGATIVE mg/dL
Urobilinogen, UA: 0.2 mg/dL (ref 0.0–1.0)

## 2012-07-30 LAB — POCT I-STAT, CHEM 8
BUN: 17 mg/dL (ref 6–23)
Creatinine, Ser: 0.9 mg/dL (ref 0.50–1.35)
Potassium: 4.1 mEq/L (ref 3.5–5.1)
Sodium: 144 mEq/L (ref 135–145)

## 2012-07-30 LAB — LIPID PANEL
Cholesterol: 127 mg/dL (ref 0–200)
Total CHOL/HDL Ratio: 2.3 RATIO
Triglycerides: 104 mg/dL (ref <150)
VLDL: 21 mg/dL (ref 0–40)

## 2012-07-30 LAB — CBC WITH DIFFERENTIAL/PLATELET
Basophils Absolute: 0 10*3/uL (ref 0.0–0.1)
Basophils Relative: 1 % (ref 0–1)
Eosinophils Absolute: 0.1 10*3/uL (ref 0.0–0.7)
Eosinophils Relative: 1 % (ref 0–5)
Lymphocytes Relative: 26 % (ref 12–46)
MCH: 32.8 pg (ref 26.0–34.0)
MCHC: 34.7 g/dL (ref 30.0–36.0)
MCV: 94.5 fL (ref 78.0–100.0)
Platelets: 185 10*3/uL (ref 150–400)
RDW: 12.9 % (ref 11.5–15.5)
WBC: 5.8 10*3/uL (ref 4.0–10.5)

## 2012-07-30 LAB — POCT I-STAT TROPONIN I: Troponin i, poc: 0 ng/mL (ref 0.00–0.08)

## 2012-07-30 LAB — PROTIME-INR: Prothrombin Time: 14.3 seconds (ref 11.6–15.2)

## 2012-07-30 MED ORDER — TIMOLOL MALEATE (ONCE-DAILY) 0.5 % OP SOLN: 1.0000 [drp] | Freq: Two times a day (BID) | OPHTHALMIC | Status: DC

## 2012-07-30 MED ORDER — HYDRALAZINE HCL 10 MG PO TABS
10.0000 mg | ORAL_TABLET | Freq: Once | ORAL | Status: AC
  Administered 2012-07-30: 10 mg via ORAL
  Filled 2012-07-30: qty 1

## 2012-07-30 MED ORDER — TIMOLOL MALEATE 0.5 % OP SOLN: 1.0000 [drp] | Freq: Two times a day (BID) | OPHTHALMIC | Status: DC

## 2012-07-30 MED ORDER — ASPIRIN 81 MG PO CHEW
324.0000 mg | CHEWABLE_TABLET | Freq: Once | ORAL | Status: AC
  Administered 2012-07-30: 324 mg via ORAL
  Filled 2012-07-30: qty 4

## 2012-07-30 NOTE — Progress Notes (Signed)
  Echocardiogram 2D Echocardiogram has been performed.  Jack Noble [name] 07/30/2012, 9:19 AM

## 2012-07-30 NOTE — ED Notes (Signed)
Requesting eye drops that he routinely takes at home PA notified.

## 2012-07-30 NOTE — ED Notes (Signed)
Per EMS, patient complaining of generalized weakness and hypertension; called his triage nurse -- was informed to go to the ED.  No facial droop present; no unilateral qeakness.  Patient alert and oriented x4; PERRL present.  Last pressure 170/90.  Patient's HR irregular on monitor (PACs).

## 2012-07-30 NOTE — ED Notes (Signed)
Patient transported to CT 

## 2012-07-30 NOTE — ED Notes (Signed)
Admitting MD at bedside.

## 2012-07-30 NOTE — ED Provider Notes (Signed)
Medical screening examination/treatment/procedure(s) were conducted as a shared visit with non-physician practitioner(s) and myself.  I personally evaluated the patient during the encounter  Seen and examined will place on TIA protocol  rrr CTAB NABS 5/5 in all 4 extremities  Vaibhav Fogleman K Lexii Walsh-Rasch, MD 07/30/12 [phone]

## 2012-07-30 NOTE — ED Notes (Signed)
Pt uses the urinal

## 2012-07-30 NOTE — ED Provider Notes (Signed)
Care of the patient assumed in the CDU on TIA protocol. Patient reports that he had an episode last evening of tingling in his upper extremities bilaterally. His wife took his blood pressure and it was found to be elevated with a systolic of approximately 220. They report that he is not currently taking anything for blood pressure. The last time he saw his primary care doctor he was given a prescription for metoprolol. He was instructed to continue to monitor his blood pressure at home and should he have consistent elevated readings he was to start the medication. He has not been taking this. Patient and wife report that by the time EMS arrived at the house this morning, his blood pressure was rechecked and had gone down to 185 systolic - he reported that his symptoms had resolved at that point in time. Patient states he has been generally somewhat weak since his hospitalization last summer. He denies any confusion, difficulty speaking, facial asymmetry. He did have a negative head CT. He was moved to CDU on TIA protocol.  On exam, the patient has 5 out of 5 strength in all extremities. He is neurovascularly intact with sensory intact to light touch in radian, median, and ulnar distributions  to the bilateral arms. He is ambulatory with a slow, steady, normal gait. Cranial nerves II through XII are grossly intact. Speech fluent. No cerebellar deficits.  Patient's lab evaluation is largely unremarkable today. His EKG is unchanged from previous. Head CT is unremarkable. There is not a cardiologist available to read echos at this time although this study was performed. There were no largely significant findings per the vascular tech. Initial report on the carotid Dopplers is negative.   MRI and MRA of the brain were ordered but the patient declines these at this time. He states that he did have an MRI at one point in the past and did not tolerate this well as he is very claustrophobic. We discussed giving a low  dose of Valium to help him through the study but patient declined this. I emphasized the importance of getting this study as it is more sensitive to rule out small stroke. He again declined. I feel as if he is in the proper frame of mind and is capable of making this decision at this time. We will plan to discharge him home at this time. He was instructed to contact his PCP, Dr. Alwyn Ren, to make a followup this week. He is currently taking an aspirin at home daily and was encouraged to continue. Signs and symptoms that would prompt a return visit to the ED were discussed. Patient and wife verbalized understanding and agreed to plan.  Grant Fontana, PA-C 07/30/12 1123

## 2012-07-30 NOTE — ED Notes (Signed)
Contacted Pharmacy and spoke to Foss in regard to patients eye drops. She states they are very expensive and was not sure that the patient needed. Them I advised her that the physician had ordered the drops and that I had requested that they be sent to CDU, but I had not received them. Now patient is going to be discharged and the PA said to disregard the order.

## 2012-07-30 NOTE — ED Notes (Signed)
Patient discharged with wife. Instructions given using the teach back method.

## 2012-07-30 NOTE — ED Notes (Signed)
Tech in to complete Echo.

## 2012-07-30 NOTE — ED Provider Notes (Signed)
History     CSN: [id number]  Arrival date & time 07/30/12  0018   First MD Initiated Contact with Patient 07/30/12 0046      Chief Complaint  Patient presents with  . Weakness  . Hypertension    (Consider location/radiation/quality/duration/timing/severity/associated sxs/prior treatment) HPI Comments: Patient developed sudden bilateral cool sensation to arms than slight dizziness and rapid heart rate  that lasted about 30 minutes, since has resolved.  BP was elevated as taken by EMS.  Reports no recent illness, travel.   Patient is a 78 y.o. male presenting with weakness and hypertension. The history is provided by the patient.  Weakness The primary symptoms include dizziness and visual change. Primary symptoms do not include headaches, syncope, loss of consciousness, seizures, paresthesias, focal weakness, loss of sensation, speech change, memory loss, fever, nausea or vomiting. The symptoms began 1 to 2 hours ago. The symptoms are improving. The neurological symptoms are focal.  Dizziness also occurs with weakness. Dizziness does not occur with nausea or vomiting.  Additional symptoms include weakness. Medical issues also include hypertension.  Hypertension Associated symptoms include a visual change and weakness. Pertinent negatives include no chest pain, chills, congestion, fever, headaches, nausea or vomiting.    Past Medical History  Diagnosis Date  . Dermatophytosis of nail   . Hyperlipidemia   . Hearing loss   . Hyperplasia of prostate without urinary obstruction   . Cataracts, bilateral   . Rosacea     Dr. Jason Nest  . Allergy   . Glaucoma     Dr. Lottie Dawson  . Corneal abrasion     Dr. Janean Sark, [facility]  . Sepsis due to Escherichia coli (E. coli) 05/2011    Morganella also cultured  . Diverticulosis     Past Surgical History  Procedure Date  . Tonsillectomy and adenoidectomy   . Appendectomy     age 31  . Cataract extraction, bilateral 2003    Dr Cecilie Kicks (now seen  @ [facility])  . Inguinal hernia repair     age 70  . Colonoscopy w/ polypectomy 06/2009    Dr.Jacobs; "miniscule polyp"  . Cholecystectomy 1978    stones    Family History  Problem Relation Age of Onset  . Stroke Mother   . Parkinsonism Brother   . Pulmonary embolism Father     Post Op  . Diabetes Neg Hx   . Cancer Neg Hx   . COPD Neg Hx   . Heart disease Neg Hx     History  Substance Use Topics  . Smoking status: Never Smoker   . Smokeless tobacco: Not on file  . Alcohol Use: No      Review of Systems  Constitutional: Negative for fever and chills.  HENT: Negative for congestion and rhinorrhea.   Respiratory: Negative for shortness of breath.   Cardiovascular: Positive for palpitations. Negative for chest pain, leg swelling and syncope.  Gastrointestinal: Negative for nausea, vomiting and diarrhea.  Genitourinary: Negative for dysuria and flank pain.  Skin: Negative for wound.  Neurological: Positive for dizziness and weakness. Negative for speech change, focal weakness, seizures, loss of consciousness, headaches and paresthesias.  Psychiatric/Behavioral: Negative for memory loss.    Allergies  Codeine; Minocycline hcl; Astelin; and Ether  Home Medications   Current Outpatient Rx  Name Route Sig Dispense Refill  . ASPIRIN 81 MG PO TABS Oral Take 162 mg by mouth daily.     Marland Kitchen VITAMIN D 1000 UNITS PO CAPS Oral Take 1,000  Units by mouth daily.      . MOMETASONE FUROATE 0.1 % EX OINT Topical Apply topically 2 (two) times daily. 45 g 0  . ADULT MULTIVITAMIN W/MINERALS CH Oral Take 1 tablet by mouth daily. Centrum Silver    . NYSTATIN-TRIAMCINOLONE 100000-0.1 UNIT/GM-% EX CREA Topical Apply topically as needed. 30 g 3  . TAMSULOSIN HCL 0.4 MG PO CAPS Oral Take 1 capsule (0.4 mg total) by mouth daily. 90 capsule 2  . TIMOLOL MALEATE 0.5 % (DAILY) OP SOLN Left Eye Place 1 drop into the left eye. 1 drop daily    . VYTORIN 10-20 MG PO TABS  TAKE 1/2 TABLET BY MOUTH EVERY  DAY 45 tablet 3  . HYOSCYAMINE SULFATE 0.125 MG SL SUBL Sublingual Place 1 tablet (0.125 mg total) under the tongue every 4 (four) hours as needed for cramping. 30 tablet 0    BP 189/91  Pulse 89  Temp 98.1 F (36.7 C) (Oral)  Resp 18  SpO2 96%  Physical Exam  Constitutional: He is oriented to person, place, and time. He appears well-developed and well-nourished.  HENT:  Head: Normocephalic.  Eyes: Pupils are equal, round, and reactive to light.  Neck: Normal range of motion.  Cardiovascular: Normal rate and regular rhythm.   Pulmonary/Chest: Effort normal. No respiratory distress. He has no wheezes.  Abdominal: Soft. He exhibits no distension. There is no tenderness.  Musculoskeletal: Normal range of motion. He exhibits no edema and no tenderness.  Neurological: He is alert and oriented to person, place, and time. He has normal strength. No cranial nerve deficit or sensory deficit. Gait normal.       Weakness in upper arms   Skin: Skin is warm. No rash noted. No pallor.    ED Course  Procedures (including critical care time)   Labs Reviewed  CBC WITH DIFFERENTIAL  URINALYSIS, ROUTINE W REFLEX MICROSCOPIC  URINE CULTURE   No results found.   No diagnosis found.  ED ECG REPORT   Date: 07/30/2012  EKG Time: 3:14 AM  Rate:90  Rhythm: normal sinus rhythm,  normal EKG, normal sinus rhythm, unchanged from previous tracings, RBBB  Axis: normal  Intervals:none  ST&T Change: none  Narrative Interpretation: abnormal            MDM  Concern for TIA will admit for observation         Arman Filter, NP 07/30/12 [phone]

## 2012-07-30 NOTE — Progress Notes (Signed)
*  PRELIMINARY RESULTS* Vascular Ultrasound Carotid Duplex (Doppler) has been completed.  Preliminary findings: Bilaterally no significant ICA stenosis with antegrade vertebral flow.  Farrel Demark, RDMS, RVT  07/30/2012, 9:50 AM

## 2012-07-31 LAB — URINE CULTURE

## 2012-08-01 ENCOUNTER — Encounter: Payer: Self-pay | Admitting: Internal Medicine

## 2012-08-01 ENCOUNTER — Telehealth: Payer: Self-pay | Admitting: *Deleted

## 2012-08-01 ENCOUNTER — Ambulatory Visit (INDEPENDENT_AMBULATORY_CARE_PROVIDER_SITE_OTHER): Payer: MEDICARE | Admitting: Internal Medicine

## 2012-08-01 VITALS — BP 132/80 | HR 68 | Temp 98.4°F | Wt 160.6 lb

## 2012-08-01 DIAGNOSIS — I10 Essential (primary) hypertension: Secondary | ICD-10-CM | POA: Diagnosis not present

## 2012-08-01 DIAGNOSIS — G459 Transient cerebral ischemic attack, unspecified: Secondary | ICD-10-CM | POA: Diagnosis not present

## 2012-08-01 MED ORDER — METOPROLOL TARTRATE 25 MG PO TABS: ORAL_TABLET | ORAL | Status: DC

## 2012-08-01 NOTE — Progress Notes (Signed)
Observation review is complete for the 07/30/2012 visit.

## 2012-08-01 NOTE — Telephone Encounter (Signed)
PT seen in ED.

## 2012-08-01 NOTE — Progress Notes (Signed)
  Subjective:    Patient ID: Jack Noble, male    DOB: [birth year], 78 y.o.   MRN: [id number]  HPI He was seen in emergency room 9/6-7/13 for possible TIA in the context of dramatically elevated blood pressures up to 221/99. The initial symptom was sensation of coolness in his arms.The extensive ER workup was reviewed. Normal and negative studies included chest x-ray, CT head without contrast, extensive labs,  carotid Dopplers and echocardiogram. The initial glucose was minimally elevated at 110, but A1c was non-diabetic @ 5.5.  He was told to initiate metoprolol 25 mg one half pill once a day until seen today.Since the emergency room visit blood pressures have gradually decreased. The range is been 144/70-157/73. Pulse rate is 63-65    Review of Systems  He denies headache, chest pain, palpitations, shortness of breath, edema, or claudication.      Objective:   Physical Exam Gen.:  well-nourished in appearance. Alert, appropriate and cooperative throughout exam. Eyes: No corneal or conjunctival inflammation noted. Pupils equal round reactive to light and accommodation.  Extraocular motion intact. Neck: No deformities, masses, or tenderness noted. Range of motion & Thyroid normal. Lungs: Normal respiratory effort; chest expands symmetrically. Lungs are clear to auscultation without rales, wheezes, or increased work of breathing. Heart: Normal rate and rhythm. Normal S1 and S2. No gallop, click, or rub. S4 w/o murmur.                                                                             Musculoskeletal/extremities: No clubbing, cyanosis, edema, or deformity noted. Tone & strength  normal. Nail health  good. Vascular: Carotid, radial artery, dorsalis pedis and  posterior tibial pulses are full and equal. No bruits present. Neurologic: Alert and oriented x3. Deep tendon reflexes symmetrical and normal. Romberg and finger to nose testing normal       Skin: Intact without suspicious  lesions or rashes. Psych: Mood and affect are normal. Normally interactive                                                                                          Assessment & Plan:   #1 TIA probably due to extremely high blood pressure. Extensive workup negative; no neurologic deficit at this time  Plan: The metoprolol 25 mg should be continued one half pill  twice a day

## 2012-08-01 NOTE — Patient Instructions (Addendum)
Blood Pressure Goal  Ideally is an AVERAGE < 135/85. This AVERAGE should be calculated from @ least 5-7 BP readings taken @ different times of day on different days of week. You should not respond to isolated BP readings , but rather the AVERAGE for that week  

## 2012-08-01 NOTE — Telephone Encounter (Signed)
[facility] Triage Call Report Triage Record Num: [id number] Operator: Dustin Flock [place] Patient Name: Jack Noble Call Date & Time: 07/29/2012 11:01:16PM Patient Phone: [phone] PCP: [name] Patient Gender: Male PCP Fax : [phone] Patient DOB: 10-04-1929 Practice Name: [facility] Reason for Call: Caller: Margaret/Spouse; PCP: [name]; CB#: [phone]; Call regarding BP 199/99, weak; got disconnected when sent emergent; Contacted caller. 221/70. P53. Is still feeling weak, Afebrile. No SOB or chest pain. Is lightheaded but able to walk without assistance. Urinating every clear urine. Is very nervous. No numbness. No nausea or vomiting. No swelling. Speaking clearly and answers appropriately. Emergent S&S identified per Hypertension diagnosed or suspected protocol,"Systolic blood pressure more than or diastolic blood pressure of more than 120 mmHg." Advised ED now. Caller states pt has PRN Metoprolol 25mg  pt to take 1/2 to 1 tab BID if BP is above 140/90. Advised pt does fall into those guidelines. Advised to still take pt to ED. States will go to [employer]. Protocol(s) Used: Hypertension, Diagnosed or Suspected Recommended Outcome per Protocol: See Provider within 4 hours Reason for Outcome: Systolic blood pressure of more than 180 mmHg OR diastolic blood pressure of more than 120 mmHg Care Advice: ~ Another adult should drive. Call EMS 911 if new symptoms develop, such as severe shortness of breath, chest pain, change in mental status, acute neurologic deficit, seizure, visual disturbances, pulse rate > 120 / minute, or very irregular pulse. ~ ~ CAUTIONS ~ List, or take, all current prescription(s), nonprescription or alternative medication(s) to provider for evaluation. 07/29/2012 11:22:28PM Page 1 of 1 [email or web address]

## 2012-08-02 NOTE — ED Provider Notes (Signed)
Medical screening examination/treatment/procedure(s) were performed by non-physician practitioner and as supervising physician I was immediately available for consultation/collaboration.  Audrena Talaga R. Genifer Lazenby, MD 08/02/12 2009 

## 2012-08-16 ENCOUNTER — Ambulatory Visit (INDEPENDENT_AMBULATORY_CARE_PROVIDER_SITE_OTHER): Payer: MEDICARE | Admitting: Internal Medicine

## 2012-08-16 ENCOUNTER — Encounter: Payer: Self-pay | Admitting: Internal Medicine

## 2012-08-16 VITALS — BP 134/78 | HR 58 | Temp 98.1°F | Wt 161.2 lb

## 2012-08-16 DIAGNOSIS — I10 Essential (primary) hypertension: Secondary | ICD-10-CM

## 2012-08-16 DIAGNOSIS — L570 Actinic keratosis: Secondary | ICD-10-CM

## 2012-08-16 DIAGNOSIS — L851 Acquired keratosis [keratoderma] palmaris et plantaris: Secondary | ICD-10-CM | POA: Diagnosis not present

## 2012-08-16 DIAGNOSIS — R61 Generalized hyperhidrosis: Secondary | ICD-10-CM | POA: Diagnosis not present

## 2012-08-16 NOTE — Progress Notes (Signed)
  Subjective:    Patient ID: Jack Noble, male    DOB: [birth year], 78 y.o.   MRN: [id number]  HPI CHRONIC HYPERTENSION: Disease Monitoring  Blood pressure range:  132/66- 162/87; his cuff reads approximately 12 points higher than the office cuff  Chest pain: no   Dyspnea: no  Claudication: no   Medication compliance: yes Medication Side Effects  Lightheadedness: some 1 hr pre eve meal  Urinary frequency:no  Edema: no   Preventitive Healthcare:  Exercise: walking decreased  to  3 X / week due to groin injury  Diet Pattern: no  Salt Restriction: yes      Review of Systems He has had some night sweats on occasion; he denies fever, chills, or weight loss. He also denies cough or sputum production.  He describes an eruption of a plaque-like lesion of the left wrist in the last several days.     Objective:   Physical Exam He appears healthy and well-nourished; he is in no acute distress  No carotid bruits are present.  Heart rhythm  normal ; rate slow with no significant murmurs or gallops.  Chest is clear with no increased work of breathing  There is no evidence of aortic aneurysm or renal artery bruits. He has no organomegaly or masses  There is no lymphadenopathy about the neck or axilla  He has no clubbing or edema.   Pedal pulses are intact   No ischemic skin changes are present. There is a 15 x 10 mm keratotic lesion over the dorsum of the left wrist. It is not inflamed. Actually he has several other smaller keratotic lesions as well      Assessment & Plan:  #1 hypertension; unfortunately his cuff appears to be given erroneous readings, apparently approximately 12 points higher than true blood pressure  #2 intermittent night sweats without other constitutional or pulmonary symptoms  #3 keratotic lesion; abnormal cytology not suggested  Plan: See orders and recommendations

## 2012-08-16 NOTE — Patient Instructions (Addendum)
Use  Aveeno Daily  Moisturizing Lotion  twice a day  for the keratoses. Bathe with moisturizing liquid soap , not bar soap.  Blood Pressure Goal  Ideally is an AVERAGE < 135/85. This AVERAGE should be calculated from @ least 5-7 BP readings taken @ different times of day on different days of week. You should not respond to isolated BP readings , but rather the AVERAGE for that week . Please bring your  blood pressure cuff to office visits to verify that it is reliable.It  can also be checked against the blood pressure device at the pharmacy. Finger or wrist cuffs are not dependable; an arm cuff is. Keep a diary of the night sweats; if they persist or progress or if  associated with weight loss or cough with sputum; chest x-ray will be repeated

## 2012-08-23 ENCOUNTER — Ambulatory Visit (INDEPENDENT_AMBULATORY_CARE_PROVIDER_SITE_OTHER): Payer: MEDICARE

## 2012-08-23 DIAGNOSIS — Z23 Encounter for immunization: Secondary | ICD-10-CM | POA: Diagnosis not present

## 2012-08-26 ENCOUNTER — Other Ambulatory Visit: Payer: Self-pay | Admitting: Internal Medicine

## 2012-09-01 DIAGNOSIS — R109 Unspecified abdominal pain: Secondary | ICD-10-CM | POA: Diagnosis not present

## 2012-09-02 DIAGNOSIS — H4050X0 Glaucoma secondary to other eye disorders, unspecified eye, stage unspecified: Secondary | ICD-10-CM | POA: Diagnosis not present

## 2012-09-02 DIAGNOSIS — H409 Unspecified glaucoma: Secondary | ICD-10-CM | POA: Diagnosis not present

## 2012-09-02 DIAGNOSIS — H18459 Nodular corneal degeneration, unspecified eye: Secondary | ICD-10-CM | POA: Diagnosis not present

## 2012-09-05 ENCOUNTER — Ambulatory Visit: Payer: MEDICARE | Admitting: Internal Medicine

## 2012-09-08 DIAGNOSIS — L259 Unspecified contact dermatitis, unspecified cause: Secondary | ICD-10-CM | POA: Diagnosis not present

## 2012-09-08 DIAGNOSIS — D485 Neoplasm of uncertain behavior of skin: Secondary | ICD-10-CM | POA: Diagnosis not present

## 2012-09-08 DIAGNOSIS — L820 Inflamed seborrheic keratosis: Secondary | ICD-10-CM | POA: Diagnosis not present

## 2012-09-26 DIAGNOSIS — L570 Actinic keratosis: Secondary | ICD-10-CM | POA: Diagnosis not present

## 2012-09-26 DIAGNOSIS — L259 Unspecified contact dermatitis, unspecified cause: Secondary | ICD-10-CM | POA: Diagnosis not present

## 2012-09-28 ENCOUNTER — Encounter: Payer: Self-pay | Admitting: Internal Medicine

## 2012-09-28 ENCOUNTER — Ambulatory Visit (INDEPENDENT_AMBULATORY_CARE_PROVIDER_SITE_OTHER): Payer: MEDICARE | Admitting: Internal Medicine

## 2012-09-28 VITALS — BP 128/76 | HR 67 | Temp 98.2°F | Wt 163.4 lb

## 2012-09-28 DIAGNOSIS — R29898 Other symptoms and signs involving the musculoskeletal system: Secondary | ICD-10-CM

## 2012-09-28 DIAGNOSIS — R42 Dizziness and giddiness: Secondary | ICD-10-CM

## 2012-09-28 NOTE — Progress Notes (Signed)
  Subjective:    Patient ID: Jack Noble, male    DOB: 07-Apr-1929, 78 y.o.   MRN: [id number]  HPI  He describes constant dizziness or lightheadedness since starting metoprolol in August. He has also had intermittent leg weakness for one month.  He denies associated frank syncope, limb numbness or tingling, or seizure activity.    Review of Systems  He notices some intracranial discomfort when he coughs since last night . Cough is dry.  He has no stool or urinary incontinence.  He has been on tamsulosin; he is not relate her symptoms to that medication.     Objective:   Physical Exam   Gen. appearance: Well-nourished, in no distress Eyes: Extraocular motion intact, field of vision normal, vision grossly intact with lenses, no nystagmus ENT:  hearing grossly normal Neck: decreased range of motion, no masses Cardiovascular: Rate and rhythm normal; no murmurs, gallops or extra heart sounds. S4 Muscle skeletal:  tone, &  strength normal Neuro:no cranial nerve deficit, deep tendon  reflexes normal, gait deliberate. Slight RUE tremor with finger to nose . Rhomberg negative Lymph: No cervical or axillary LA Skin: Warm and dry without suspicious lesions or rashes Psych: no anxiety or mood change. Normally interactive and cooperative.Oriented x 3  Postural blood pressures reveal no change in blood pressure or pulse of significance.      Assessment & Plan:  #1 dizziness # 2 limb weakness, not documented Plan: See orders and recommendations

## 2012-09-28 NOTE — Patient Instructions (Addendum)
Blood Pressure Goal  Ideally is an AVERAGE < 135/85. This AVERAGE should be calculated from @ least 5-7 BP readings taken @ different times of day on different days of week. You should not respond to isolated BP readings , but rather the AVERAGE for that week.   Please bring your  blood pressure cuff to office visits to verify that it is reliable.It  can also be checked against the blood pressure device at the pharmacy. Finger or wrist cuffs are not dependable; an arm cuff is  a

## 2012-10-13 ENCOUNTER — Ambulatory Visit (INDEPENDENT_AMBULATORY_CARE_PROVIDER_SITE_OTHER): Payer: MEDICARE | Admitting: Internal Medicine

## 2012-10-13 VITALS — BP 156/74 | HR 63 | Temp 98.1°F | Resp 16 | Wt 163.0 lb

## 2012-10-13 DIAGNOSIS — I10 Essential (primary) hypertension: Secondary | ICD-10-CM

## 2012-10-13 NOTE — Progress Notes (Signed)
  Subjective:    Patient ID: Jack Noble, male    DOB: [birth year], 78 y.o.   MRN: [id number]  HPI CHRONIC HYPERTENSION  Disease Monitoring  Blood pressure range: 133/64-150/73; average 141/68 . This is OFF beta blocker.His      Preventitive Healthcare:  Exercise: 3-4X/week X 30 minutes  Diet Pattern: heart healthy  Salt Restriction: yes      Review of Systems Chest pain: no   Dyspnea:no  Claudication: no              Lightheadedness: no  Urinary frequency: no  Edema: no     Objective:   Physical Exam He appears healthy and well-nourished; he is in no acute distress.Appears younger than stated age   No carotid bruits are present.  Heart rhythm and rate are normal with no significant murmurs or gallops. S4 with slurring    Chest is clear with no increased work of breathing  There is no evidence of aortic aneurysm or renal artery bruits  He has no clubbing or edema.   Pedal pulses are intact   No ischemic skin changes are present         Assessment & Plan:

## 2012-10-13 NOTE — Assessment & Plan Note (Addendum)
Results are available all 2 agents, Blood pressure is averaging less than 140/90. Subjectively he feels much better off the beta blocker.  BP monitor will continue; if the blood pressure is staying above 140/90 on average; very low dose ACE inhibitor (ramipril 2.5 mg daily) would be recommended because of its cardioprotection properties.

## 2012-10-13 NOTE — Patient Instructions (Addendum)
Blood Pressure Goal= < 140/90,  Ideal is an AVERAGE < 135/85. This AVERAGE should be calculated from @ least 5-7 BP readings taken @ different times of day on different days of week. You should not respond to isolated BP readings , but rather the AVERAGE for that week

## 2012-11-11 ENCOUNTER — Ambulatory Visit (INDEPENDENT_AMBULATORY_CARE_PROVIDER_SITE_OTHER): Payer: MEDICARE | Admitting: Internal Medicine

## 2012-11-11 VITALS — BP 138/84 | HR 67 | Temp 97.3°F | Wt 163.0 lb

## 2012-11-11 DIAGNOSIS — I10 Essential (primary) hypertension: Secondary | ICD-10-CM | POA: Diagnosis not present

## 2012-11-11 NOTE — Progress Notes (Signed)
  Subjective:    Patient ID: Jack Noble, male    DOB: Jun 25, 1929, 78 y.o.   MRN: [id number]  HPI Acute visit Had lower extremity edema for the last 2 days, edema resolved. 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 His blood pressure has been very good except for the last 3 days when it has been  as high as 160.  Past Medical History  Diagnosis Date  . Dermatophytosis of nail   . Hyperlipidemia   . Hearing loss   . Hyperplasia of prostate without urinary obstruction   . Cataracts, bilateral   . Rosacea     Dr. Jason Nest  . Allergy   . Glaucoma(365)     Dr. Lottie Dawson  . Corneal abrasion     Dr. Janean Sark, [facility]  . Sepsis due to Escherichia coli (E. coli) 05/2011    Morganella also cultured  . Diverticulosis   . TIA (transient ischemic attack) 9/6-05/2012    BP 221/99   Past Surgical History  Procedure Date  . Tonsillectomy and adenoidectomy   . Appendectomy     age 51  . Cataract extraction, bilateral 2003    Dr Cecilie Kicks (now seen @ [facility])  . Inguinal hernia repair     age 59  . Colonoscopy w/ polypectomy 06/2009    Dr.Jacobs; "miniscule polyp"  . Cholecystectomy 1978    stones    Review of Systems No chest pain or shortness of breath. No recent change in medications No indiscretions on sodium intake.     Objective:   Physical Exam  General -- alert, well-developed, and well-nourished.   Neck --no  JVD 45  Lungs -- normal respiratory effort, no intercostal retractions, no accessory muscle use, and normal breath sounds.   Heart-- normal rate, regular rhythm, no murmur, and no gallop.   Extremities--  trace pretibial edema, symmetric, no ankle edema, no redness, normal pedal pulses  Psych-- Cognition and judgment appear intact. Alert and cooperative with normal attention span and concentration.  not anxious appearing and not depressed appearing.      Assessment & Plan:

## 2012-11-11 NOTE — Patient Instructions (Addendum)
Low-salt diet, try to keep your  legs elevated at least one hour twice a day Check the  blood pressure 2 or 3 times a week, be sure it is between 110/60 and 140/85. If it is consistently higher or lower, let me know

## 2012-11-13 ENCOUNTER — Encounter: Payer: Self-pay | Admitting: Internal Medicine

## 2012-11-13 NOTE — Assessment & Plan Note (Signed)
Transient increasing BP and some edema. Review of systems is negative for red flag symptoms. BP today is satisfactory. Plan: see  instructions

## 2013-01-03 DIAGNOSIS — L259 Unspecified contact dermatitis, unspecified cause: Secondary | ICD-10-CM | POA: Diagnosis not present

## 2013-01-03 DIAGNOSIS — D485 Neoplasm of uncertain behavior of skin: Secondary | ICD-10-CM | POA: Diagnosis not present

## 2013-01-03 DIAGNOSIS — L820 Inflamed seborrheic keratosis: Secondary | ICD-10-CM | POA: Diagnosis not present

## 2013-01-09 DIAGNOSIS — H4050X0 Glaucoma secondary to other eye disorders, unspecified eye, stage unspecified: Secondary | ICD-10-CM | POA: Diagnosis not present

## 2013-01-16 ENCOUNTER — Encounter: Payer: Self-pay | Admitting: Internal Medicine

## 2013-01-16 ENCOUNTER — Ambulatory Visit (INDEPENDENT_AMBULATORY_CARE_PROVIDER_SITE_OTHER): Payer: MEDICARE | Admitting: Internal Medicine

## 2013-01-16 VITALS — BP 138/70 | HR 73 | Temp 98.4°F | Wt 162.8 lb

## 2013-01-16 DIAGNOSIS — L259 Unspecified contact dermatitis, unspecified cause: Secondary | ICD-10-CM | POA: Diagnosis not present

## 2013-01-16 DIAGNOSIS — L309 Dermatitis, unspecified: Secondary | ICD-10-CM

## 2013-01-16 MED ORDER — AZITHROMYCIN 250 MG PO TABS: ORAL_TABLET | ORAL | Status: DC

## 2013-01-16 MED ORDER — NYSTATIN-TRIAMCINOLONE 100000-0.1 UNIT/GM-% EX CREA: TOPICAL_CREAM | CUTANEOUS | Status: DC | PRN

## 2013-01-16 NOTE — Patient Instructions (Addendum)
Take Zpack as 1 pill daily X 6 days

## 2013-01-16 NOTE — Progress Notes (Signed)
  Subjective:    Patient ID: Jack Noble, male    DOB: Aug 04, 1929, 79 y.o.   MRN: [id number]  HPI Skin lesions began years ago & has recurred intermittently as red rash w/o pustules or blisters; Some itching & tenderness. Lesions were localized to neck &  face  The dermatologic lesions vary in intensity throughout day No treatment to date for this exacerbation. It did respond to a Zpack Rxed by a Dermatologist 5 years ago.  There was no associated trigger or context such as exposure to animals ,insects, new medications  or topical cosmetics.          Review of Systems Constitutional: no fever ; chills; change in weight ; excessive fatigue Allergy/immunologic: no sneezing ; rhinitis ; angioedema ; itchy eyes ; hives Eyes: no blurred/double/loss of vision  ; redness ; discharge Ears, nose and throat/mouth: no nasal congestion ; sore throat ; earache ;  facial pain ;frontal headache  Respirations:no cough ; sputum  ; dyspnea; wheezing GI:no nausea/vomiting ; diarrhea GU: no dysuria ; discharge  Musculoskeletal: no joint swelling or redness  Neurologic:no numbness or tingling  Endocrine:no other change in hair, skin, skin, nails  Hematologic/lymphatic:no abnormal bruising or bleeding ; enlarged lymph nodes       Objective:   Physical Exam Gen.: well-nourished in appearance. Alert, appropriate and cooperative throughout exam.  Eyes: No corneal or conjunctival inflammation noted.No icterus Nose: External nasal exam reveals no deformity or inflammation. Nasal mucosa are pink and moist. No lesions or exudates noted.  Mouth: Oral mucosa and oropharynx reveal no lesions or exudates. Teeth in good repair. Neck: No deformities, masses, or tenderness noted.  Lungs: Normal respiratory effort; chest expands symmetrically. Lungs are clear to auscultation without rales, wheezes, or increased work of breathing. Heart: Normal rate and rhythm. Normal S1 and S2. No gallop, click, or rub.No  murmur.        Skin: Plaque like erythematous rash in an irregular pattern over the temples, maxilla and anterior nose. More of a papular character over the posterior neck to the rash. No blisters or pustules present Lymph: No cervical, axillary, or inguinal lymphadenopathy present. Psych: Mood and affect are normal. Normally interactive                                                                                        Assessment & Plan:  #1 facial dermatitis Plan: See orders and recommendations

## 2013-05-01 ENCOUNTER — Ambulatory Visit (INDEPENDENT_AMBULATORY_CARE_PROVIDER_SITE_OTHER): Payer: MEDICARE | Admitting: Internal Medicine

## 2013-05-01 ENCOUNTER — Encounter: Payer: Self-pay | Admitting: Internal Medicine

## 2013-05-01 VITALS — BP 128/72 | HR 71 | Temp 98.4°F | Wt 163.0 lb

## 2013-05-01 DIAGNOSIS — L259 Unspecified contact dermatitis, unspecified cause: Secondary | ICD-10-CM

## 2013-05-01 DIAGNOSIS — L309 Dermatitis, unspecified: Secondary | ICD-10-CM

## 2013-05-01 MED ORDER — MOMETASONE FUROATE 0.1 % EX OINT: TOPICAL_OINTMENT | Freq: Two times a day (BID) | CUTANEOUS | Status: DC

## 2013-05-01 MED ORDER — SIMVASTATIN 20 MG PO TABS: 20.0000 mg | ORAL_TABLET | Freq: Every evening | ORAL | Status: DC

## 2013-05-01 NOTE — Progress Notes (Signed)
  Subjective:    Patient ID: Jack Noble, male    DOB: 14-Aug-1929, 79 y.o.   MRN: [id number]  HPI  The rash began only on the right hand 2 months ago.     He actually had some rash at the time he was treated for facial rash in February of this year. The facial rash responded to Zithromax. He was using mometasone topically to the hands with response as well; but he ran out of the medication. The rash is described as pruritic.  He's had no fever, chills, or sweats. He's had no chemical or topical exposures such as a golf glove . They have no pets there has been no tick exposure.  No PMH of eczema      Review of Systems  He has no fever, chills, or sweats.  He's had no itchy, watery eyes, or sneezing. He denies shortness of breath or wheezing. He also has had no swelling of his lips or tongue.     Objective:   Physical Exam   He appears healthy and well-nourished in no distress  Nares are patent and oropharynx reveals no lesions.  He has a regular rhythm without murmurs or gallops.  Chest is clear with no wheezing.  He has no lymphadenopathy about the neck, axilla, or epitrochlear areas  There is a slightly erythematous rash over the dorsal aspect of the right palm and lateral aspects of the fourth right digit. The skin is slightly rough in this area        Assessment & Plan:  #1 eczematoid rash Plan: see orders

## 2013-05-01 NOTE — Patient Instructions (Addendum)
Bathe with moisturizing liquid soap , not bar soap. Please  schedule fasting Labs in 10-11 weeks after med changes: CK,Lipids, hepatic panel, TSH. PLEASE BRING THESE INSTRUCTIONS TO FOLLOW UP  LAB APPOINTMENT.This will guarantee correct labs are drawn, eliminating need for repeat blood sampling ( needle sticks ! ). Diagnoses /Codes: 454.0,981.19

## 2013-05-18 ENCOUNTER — Encounter: Payer: Self-pay | Admitting: Internal Medicine

## 2013-05-18 ENCOUNTER — Ambulatory Visit (INDEPENDENT_AMBULATORY_CARE_PROVIDER_SITE_OTHER): Payer: MEDICARE | Admitting: Internal Medicine

## 2013-05-18 VITALS — BP 132/84 | HR 56 | Temp 98.4°F | Ht 66.0 in | Wt 160.0 lb

## 2013-05-18 DIAGNOSIS — Z0000 Encounter for general adult medical examination without abnormal findings: Secondary | ICD-10-CM | POA: Diagnosis not present

## 2013-05-18 DIAGNOSIS — E785 Hyperlipidemia, unspecified: Secondary | ICD-10-CM | POA: Diagnosis not present

## 2013-05-18 DIAGNOSIS — Z860100 Personal history of colon polyps, unspecified: Secondary | ICD-10-CM

## 2013-05-18 LAB — CBC WITH DIFFERENTIAL/PLATELET
Basophils Absolute: 0 10*3/uL (ref 0.0–0.1)
Eosinophils Absolute: 0.1 10*3/uL (ref 0.0–0.7)
Hemoglobin: 14.8 g/dL (ref 13.0–17.0)
Lymphocytes Relative: 26.9 % (ref 12.0–46.0)
Lymphs Abs: 1.8 10*3/uL (ref 0.7–4.0)
MCHC: 33.1 g/dL (ref 30.0–36.0)
Monocytes Absolute: 0.4 10*3/uL (ref 0.1–1.0)
Neutro Abs: 4.3 10*3/uL (ref 1.4–7.7)
RDW: 13.2 % (ref 11.5–14.6)

## 2013-05-18 LAB — BASIC METABOLIC PANEL
CO2: 28 mEq/L (ref 19–32)
Calcium: 9.4 mg/dL (ref 8.4–10.5)
Glucose, Bld: 112 mg/dL — ABNORMAL HIGH (ref 70–99)
Sodium: 136 mEq/L (ref 135–145)

## 2013-05-18 LAB — LIPID PANEL
Cholesterol: 134 mg/dL (ref 0–200)
HDL: 52.5 mg/dL (ref >39.00)
Triglycerides: 89 mg/dL (ref 0.0–149.0)
VLDL: 17.8 mg/dL (ref 0.0–40.0)

## 2013-05-18 LAB — HEPATIC FUNCTION PANEL: Albumin: 4.3 g/dL (ref 3.5–5.2)

## 2013-05-18 NOTE — Patient Instructions (Addendum)
The statin will be changed to 20 mg of simvastatin one half pill at bedtime depending on the pending lab results

## 2013-05-18 NOTE — Progress Notes (Signed)
He is here for a Medicare Wellness exam.

## 2013-05-18 NOTE — Progress Notes (Signed)
Subjective:    Patient ID: Jack Noble, male    DOB: 02-Aug-1929, 79 y.o.   MRN: [id number]  HPI Medicare Wellness Visit:  Psychosocial & medical history were reviewed as required by Medicare (abuse,antisocial behavioral risks,firearm risk).  Social history: caffeine: 1 cup half caffeine coffee , alcohol: none  ,  tobacco use:  never  Exercise :exercise 5 days per week, walking, 1 hour at a time   Home & personal  safety / fall risk: none Limitations of activities of daily living: none Seatbelt  and smoke alarm use: wears seat belt, has smoke alarm in house Power of Attorney/Living Will status : has both documents Ophthalmology exam status : goes twice per year, last exam Feb. 2014 Hearing evaluation status: no formal testing Orientation :oriented X 3 Memory & recall : recalls 3/3 words Spelling or math testing: spells WORLD backward- DLORW Active depression / anxiety:denied Foreign travel history : [place], [place]. [place], [place] in 1970s Immunization status- has not had shingles, UTD on Flu/ PNA/ tetanus Transfusion history:  never Preventive health surveillance status: colonoscopy 2010,  Dental care:  Every 6 months Chart reviewed &  Updated. Active issues reviewed & addressed.      Review of Systems He is on a heart healthy diet; he exercises 60 minutes 5 times per week without symptoms. Specifically he denies chest pain, palpitations, dyspnea, or claudication. Family history is negative for premature coronary disease. Advanced cholesterol testing reveals his LDL goal was less than 110.     Objective:   Physical Exam Gen.: Healthy and well-nourished in appearance. Alert, appropriate and cooperative throughout exam.Appears younger than stated age  Head: Normocephalic without obvious abnormalities;  Male pattern alopecia  Eyes: No corneal or conjunctival inflammation noted. Right eye with oval shaped pupil from scarring, however, pupil is reactive to  light and accomodation.  Left pupill round reactive to light and accommodation.  Extraocular motion intact. Vision grossly normal with lenses Ears: External  ear exam reveals no significant lesions or deformities. Canals clear .TMs normal. Hearing is grossly normal bilaterally. Nose: External nasal exam reveals no deformity or inflammation. Nasal mucosa are pink and moist. No lesions or exudates noted. Septum  normal Mouth: Oral mucosa and oropharynx reveal no lesions or exudates. Teeth in good repair. Neck: No deformities, masses, or tenderness noted. Range of motion mildly limited. Thyroid normal. Lungs: Normal respiratory effort; chest expands symmetrically. Lungs are clear to auscultation without rales, wheezes, or increased work of breathing. Heart: Normal rate and rhythm. Normal S1 and S2. No gallop, click, or rub. No murmur. Abdomen: Bowel sounds normal; abdomen soft and nontender. Large paraincisional  hernia.  No masse or organomegaly.                                Musculoskeletal/extremities: There is some asymmetry of the posterior thoracic musculature suggesting occult scoliosis. No clubbing, cyanosis, edema, or significant extremity  deformity noted. Range of motion normal .Tone & strength  Normal. Joints normal / reveal no  DJD DIP changes. Nail health good. Able to lie down & sit up w/o help. Negative SLR bilaterally Vascular: Carotid, radial artery, dorsalis pedis and  posterior tibial pulses are full and equal. No bruits present. Neurologic: Alert and oriented x3. Deep tendon reflexes symmetrical and normal.  Skin: Intact without suspicious lesions or rashes. Lymph: No cervical, axillary lymphadenopathy present. Psych: Mood and affect are normal. Normally interactive  Assessment & Plan:  #1 Medicare Wellness Exam; criteria met ; data entered #2 Problem List/Diagnoses reviewed Plan:   Assessments made/ Orders entered

## 2013-05-19 ENCOUNTER — Ambulatory Visit: Payer: MEDICARE

## 2013-05-19 ENCOUNTER — Encounter: Payer: Self-pay | Admitting: *Deleted

## 2013-05-19 ENCOUNTER — Other Ambulatory Visit: Payer: Self-pay | Admitting: Internal Medicine

## 2013-05-19 DIAGNOSIS — R7309 Other abnormal glucose: Secondary | ICD-10-CM

## 2013-05-20 ENCOUNTER — Encounter: Payer: Self-pay | Admitting: Internal Medicine

## 2013-05-22 ENCOUNTER — Ambulatory Visit (INDEPENDENT_AMBULATORY_CARE_PROVIDER_SITE_OTHER): Payer: MEDICARE | Admitting: Internal Medicine

## 2013-05-22 ENCOUNTER — Encounter: Payer: Self-pay | Admitting: *Deleted

## 2013-05-22 ENCOUNTER — Encounter: Payer: Self-pay | Admitting: Internal Medicine

## 2013-05-22 VITALS — BP 168/80 | HR 69 | Temp 98.3°F | Wt 162.2 lb

## 2013-05-22 DIAGNOSIS — I10 Essential (primary) hypertension: Secondary | ICD-10-CM

## 2013-05-22 DIAGNOSIS — R609 Edema, unspecified: Secondary | ICD-10-CM | POA: Diagnosis not present

## 2013-05-22 MED ORDER — HYDROCHLOROTHIAZIDE 12.5 MG PO CAPS: 12.5000 mg | ORAL_CAPSULE | Freq: Every day | ORAL | Status: DC

## 2013-05-22 MED ORDER — SIMVASTATIN 20 MG PO TABS: ORAL_TABLET | ORAL | Status: DC

## 2013-05-22 NOTE — Patient Instructions (Addendum)

## 2013-05-22 NOTE — Progress Notes (Signed)
  Subjective:    Patient ID: Jack Noble, male    DOB: 10-16-1929, 79 y.o.   MRN: [id number]  HPI He's noted some swelling of the right ankle over the past 3 weeks without specific trigger other than prolonged sitting at his desk. He specifically denies increased salt intake or prolonged travel. He is not on amlodipine.  He has not been monitoring his blood pressure recently. His labs completed 05/17/13 were reviewed. Kidney function and liver function were both normal.    Review of Systems  He denies chest pain, palpitations, exertional dyspnea, claudication, or paroxysmal nocturnal dyspnea.  He denies significant headache, epistaxis, lightheadedness, or syncope.    Objective:   Physical Exam  Appears healthy and well-nourished & in no acute distress.   No carotid bruits are present.No neck pain distention present at 10 - 15 degrees.  Heart rhythm and rate are normal with no significant murmurs or gallops. Split S1  Chest is clear with no increased work of breathing  There is no evidence of aortic aneurysm or renal artery bruits  Abdomen soft with no organomegaly . Incisional hernia . No HJR  No clubbing, cyanosis . Trace edema present.  Pedal pulses are intact   No ischemic skin changes are present . Chronic L great toe nail changes   Alert and oriented. Strength, tone normal          Assessment & Plan:  #1 edema #2 HTN

## 2013-05-23 ENCOUNTER — Other Ambulatory Visit: Payer: Self-pay | Admitting: Internal Medicine

## 2013-06-09 ENCOUNTER — Ambulatory Visit (INDEPENDENT_AMBULATORY_CARE_PROVIDER_SITE_OTHER): Payer: MEDICARE | Admitting: Internal Medicine

## 2013-06-09 VITALS — BP 138/70 | HR 70 | Temp 98.0°F | Wt 162.0 lb

## 2013-06-09 DIAGNOSIS — R040 Epistaxis: Secondary | ICD-10-CM

## 2013-06-09 NOTE — Patient Instructions (Addendum)
Use Eucerin   twice a day  for the drying. Stop baby aspirin.

## 2013-06-09 NOTE — Progress Notes (Signed)
  Subjective:    Patient ID: Jack Noble, male    DOB: 12-16-1928, 79 y.o.   MRN: [id number]  HPI   Symptoms began last week as bleeding from the right nare which awoke him. This was controlled with cold compresses.  It's recurred twice since that episode. It happened last night and again this morning. Compresses & ice stopped this  He is on 81 mg of aspirin but did not take it this morning. He's on no other anticoagulants or supplements which have blood thinning  activity    Review of Systems He denies  hemoptysis, hematuria, melena, or rectal bleeding.He has no unexplained weight loss, dysphagia, or abdominal pain. He has no abnormal bruising or bleeding. He has no difficulty stopping bleeding with injury.  He's had no associated upper respiratory tract infection symptoms. Specifically he denies frontal headache, facial pain, nasal purulence, dental pain, sore throat, otic pain, or otic discharge. Is also been no associated cough or sputum production. He does not have associated fever, chills, or sweats     Objective:   Physical Exam General appearance:good health ;well nourished; no acute distress or increased work of breathing is present.  No  lymphadenopathy about the head, neck, or axilla noted.   Eyes: No conjunctival inflammation or lid edema is present. There is no scleral icterus.  Ears:  External ear exam shows no significant lesions or deformities.  Otoscopic examination reveals clear canals, tympanic membranes are intact bilaterally without bulging, retraction, inflammation or discharge.  Nose:  External nasal examination shows no deformity or inflammation. Nasal mucosa mardely erythematous without lesions,exudates,or active bleeding. No septal dislocation or deviation.No obstruction to airflow.   Oral exam: Dental hygiene is good; lips and gums are healthy appearing.There is no oropharyngeal erythema or exudate noted.   Neck:  No deformities, masses, or tenderness  noted.    Heart:  Normal rate and regular rhythm. S1 and S2 normal without gallop, murmur, click, rub or other extra sounds.   Lungs:Chest clear to auscultation; no wheezes, rhonchi,rales ,or rubs present.No increased work of breathing.    Large para incisional abdominal hernia which is reducible for the most part  Extremities:  No cyanosis, edema, or clubbing  noted    Skin: Warm & dry        Assessment & Plan:  #1 epistaxis; this is most likely related to nasal septum drying. Low-dose aspirin could be playing a role. At this time his blood pressure is not under control. He has no history to suggest an upper respiratory tract infection or bleeding dyscrasias.  Plan: Topical Eucerin twice a day. Aspirin will be held. If the epistaxis persists; referral to Dr. Ezzard Standing will be pursued for cautery

## 2013-06-14 ENCOUNTER — Telehealth: Payer: Self-pay | Admitting: Internal Medicine

## 2013-06-14 ENCOUNTER — Other Ambulatory Visit: Payer: Self-pay | Admitting: Internal Medicine

## 2013-06-14 DIAGNOSIS — R040 Epistaxis: Secondary | ICD-10-CM

## 2013-06-14 NOTE — Telephone Encounter (Signed)
Hopp please advise  

## 2013-06-14 NOTE — Telephone Encounter (Signed)
Patient's wife is calling and states that the patient's nose bleed has improved but he still sees blood when he wipes with a tissue. She is calling to inquire about a referral to Dr. Ezzard Standing in ENT as Dr. Drue Novel  mentioned would be the next step.

## 2013-06-19 DIAGNOSIS — R040 Epistaxis: Secondary | ICD-10-CM | POA: Diagnosis not present

## 2013-07-20 ENCOUNTER — Telehealth: Payer: Self-pay | Admitting: Internal Medicine

## 2013-07-21 NOTE — Telephone Encounter (Signed)
Pharmacy([facility]) called to say that the pt had a reaction to the Simvastatin (Rash and upset stomach), and he will need to changed to another generic statin.   Pt was told on (05-19-13) -The generic Zocor 20 mg can started @ 1/2 pill daily @ bedtime after you complete your present supply of Vytorin. After 10-11 weeks of the new medication; please recheck the fasting lipids.  Please advise.//AB/CMA

## 2013-07-24 ENCOUNTER — Emergency Department (HOSPITAL_COMMUNITY): Payer: MEDICARE

## 2013-07-24 ENCOUNTER — Inpatient Hospital Stay (HOSPITAL_COMMUNITY): Payer: MEDICARE

## 2013-07-24 ENCOUNTER — Inpatient Hospital Stay (HOSPITAL_COMMUNITY)
Admission: EM | Admit: 2013-07-24 | Discharge: 2013-07-27 | DRG: 871 | Disposition: A | Discharge status: Home / Self Care | Payer: MEDICARE | Attending: Internal Medicine | Admitting: Internal Medicine

## 2013-07-24 ENCOUNTER — Encounter (HOSPITAL_COMMUNITY): Payer: Self-pay | Admitting: Internal Medicine

## 2013-07-24 DIAGNOSIS — H409 Unspecified glaucoma: Secondary | ICD-10-CM | POA: Diagnosis present

## 2013-07-24 DIAGNOSIS — R7989 Other specified abnormal findings of blood chemistry: Secondary | ICD-10-CM | POA: Diagnosis present

## 2013-07-24 DIAGNOSIS — J189 Pneumonia, unspecified organism: Secondary | ICD-10-CM | POA: Insufficient documentation

## 2013-07-24 DIAGNOSIS — J309 Allergic rhinitis, unspecified: Secondary | ICD-10-CM

## 2013-07-24 DIAGNOSIS — D72819 Decreased white blood cell count, unspecified: Secondary | ICD-10-CM | POA: Diagnosis present

## 2013-07-24 DIAGNOSIS — N390 Urinary tract infection, site not specified: Secondary | ICD-10-CM | POA: Diagnosis present

## 2013-07-24 DIAGNOSIS — I10 Essential (primary) hypertension: Secondary | ICD-10-CM | POA: Diagnosis present

## 2013-07-24 DIAGNOSIS — D179 Benign lipomatous neoplasm, unspecified: Secondary | ICD-10-CM

## 2013-07-24 DIAGNOSIS — E785 Hyperlipidemia, unspecified: Secondary | ICD-10-CM | POA: Diagnosis present

## 2013-07-24 DIAGNOSIS — H9190 Unspecified hearing loss, unspecified ear: Secondary | ICD-10-CM | POA: Diagnosis present

## 2013-07-24 DIAGNOSIS — I498 Other specified cardiac arrhythmias: Secondary | ICD-10-CM | POA: Diagnosis present

## 2013-07-24 DIAGNOSIS — R799 Abnormal finding of blood chemistry, unspecified: Secondary | ICD-10-CM

## 2013-07-24 DIAGNOSIS — K461 Unspecified abdominal hernia with gangrene: Secondary | ICD-10-CM | POA: Diagnosis not present

## 2013-07-24 DIAGNOSIS — R112 Nausea with vomiting, unspecified: Secondary | ICD-10-CM | POA: Diagnosis present

## 2013-07-24 DIAGNOSIS — D649 Anemia, unspecified: Secondary | ICD-10-CM | POA: Diagnosis not present

## 2013-07-24 DIAGNOSIS — N400 Enlarged prostate without lower urinary tract symptoms: Secondary | ICD-10-CM | POA: Diagnosis present

## 2013-07-24 DIAGNOSIS — R4182 Altered mental status, unspecified: Secondary | ICD-10-CM

## 2013-07-24 DIAGNOSIS — B9689 Other specified bacterial agents as the cause of diseases classified elsewhere: Secondary | ICD-10-CM | POA: Diagnosis not present

## 2013-07-24 DIAGNOSIS — E162 Hypoglycemia, unspecified: Secondary | ICD-10-CM

## 2013-07-24 DIAGNOSIS — K439 Ventral hernia without obstruction or gangrene: Secondary | ICD-10-CM | POA: Diagnosis present

## 2013-07-24 DIAGNOSIS — L719 Rosacea, unspecified: Secondary | ICD-10-CM

## 2013-07-24 DIAGNOSIS — A4151 Sepsis due to Escherichia coli [E. coli]: Principal | ICD-10-CM | POA: Diagnosis present

## 2013-07-24 DIAGNOSIS — Z66 Do not resuscitate: Secondary | ICD-10-CM | POA: Diagnosis present

## 2013-07-24 DIAGNOSIS — K5730 Diverticulosis of large intestine without perforation or abscess without bleeding: Secondary | ICD-10-CM

## 2013-07-24 DIAGNOSIS — G9341 Metabolic encephalopathy: Secondary | ICD-10-CM | POA: Diagnosis present

## 2013-07-24 DIAGNOSIS — K2970 Gastritis, unspecified, without bleeding: Secondary | ICD-10-CM | POA: Diagnosis not present

## 2013-07-24 DIAGNOSIS — R7309 Other abnormal glucose: Secondary | ICD-10-CM

## 2013-07-24 DIAGNOSIS — A419 Sepsis, unspecified organism: Secondary | ICD-10-CM | POA: Diagnosis present

## 2013-07-24 DIAGNOSIS — R0602 Shortness of breath: Secondary | ICD-10-CM | POA: Diagnosis not present

## 2013-07-24 DIAGNOSIS — R509 Fever, unspecified: Secondary | ICD-10-CM

## 2013-07-24 DIAGNOSIS — D72829 Elevated white blood cell count, unspecified: Secondary | ICD-10-CM | POA: Diagnosis not present

## 2013-07-24 DIAGNOSIS — D696 Thrombocytopenia, unspecified: Secondary | ICD-10-CM | POA: Diagnosis present

## 2013-07-24 DIAGNOSIS — K430 Incisional hernia with obstruction, without gangrene: Secondary | ICD-10-CM

## 2013-07-24 DIAGNOSIS — R030 Elevated blood-pressure reading, without diagnosis of hypertension: Secondary | ICD-10-CM | POA: Diagnosis present

## 2013-07-24 DIAGNOSIS — R7881 Bacteremia: Secondary | ICD-10-CM

## 2013-07-24 DIAGNOSIS — IMO0002 Reserved for concepts with insufficient information to code with codable children: Secondary | ICD-10-CM

## 2013-07-24 DIAGNOSIS — Z86010 Personal history of colonic polyps: Secondary | ICD-10-CM

## 2013-07-24 DIAGNOSIS — A498 Other bacterial infections of unspecified site: Secondary | ICD-10-CM | POA: Diagnosis not present

## 2013-07-24 HISTORY — DX: Epistaxis: R04.0

## 2013-07-24 LAB — COMPREHENSIVE METABOLIC PANEL
BUN: 15 mg/dL (ref 6–23)
CO2: 23 mEq/L (ref 19–32)
Calcium: 9.3 mg/dL (ref 8.4–10.5)
Chloride: 103 mEq/L (ref 96–112)
Creatinine, Ser: 0.67 mg/dL (ref 0.50–1.35)
GFR calc Af Amer: >90 mL/min (ref >90)
GFR calc non Af Amer: 87 mL/min — ABNORMAL LOW (ref >90)
Glucose, Bld: 118 mg/dL — ABNORMAL HIGH (ref 70–99)
Total Bilirubin: 0.6 mg/dL (ref 0.3–1.2)

## 2013-07-24 LAB — CBC WITH DIFFERENTIAL/PLATELET
Basophils Absolute: 0 10*3/uL (ref 0.0–0.1)
Eosinophils Relative: 0 % (ref 0–5)
HCT: 41.1 % (ref 39.0–52.0)
Hemoglobin: 14.1 g/dL (ref 13.0–17.0)
Lymphocytes Relative: 5 % — ABNORMAL LOW (ref 12–46)
MCV: 94.9 fL (ref 78.0–100.0)
Monocytes Absolute: 0 10*3/uL — ABNORMAL LOW (ref 0.1–1.0)
Monocytes Relative: 1 % — ABNORMAL LOW (ref 3–12)
Neutro Abs: 3.5 10*3/uL (ref 1.7–7.7)
RDW: 12.8 % (ref 11.5–15.5)
WBC: 3.7 10*3/uL — ABNORMAL LOW (ref 4.0–10.5)

## 2013-07-24 LAB — URINALYSIS, ROUTINE W REFLEX MICROSCOPIC
Glucose, UA: NEGATIVE mg/dL
Hgb urine dipstick: NEGATIVE
Ketones, ur: NEGATIVE mg/dL
Protein, ur: NEGATIVE mg/dL
pH: 5 (ref 5.0–8.0)

## 2013-07-24 LAB — CG4 I-STAT (LACTIC ACID): Lactic Acid, Venous: 3.52 mmol/L — ABNORMAL HIGH (ref 0.5–2.2)

## 2013-07-24 LAB — POCT I-STAT TROPONIN I: Troponin i, poc: 0.01 ng/mL (ref 0.00–0.08)

## 2013-07-24 MED ORDER — SODIUM CHLORIDE 0.9 % IV BOLUS (SEPSIS)
1000.0000 mL | Freq: Once | INTRAVENOUS | Status: AC
  Administered 2013-07-24: 1000 mL via INTRAVENOUS

## 2013-07-24 MED ORDER — ADULT MULTIVITAMIN W/MINERALS CH
1.0000 | ORAL_TABLET | Freq: Every morning | ORAL | Status: DC
  Administered 2013-07-25 – 2013-07-27 (×3): 1 via ORAL
  Filled 2013-07-24 (×3): qty 1

## 2013-07-24 MED ORDER — AZITHROMYCIN 500 MG PO TABS
500.0000 mg | ORAL_TABLET | Freq: Every day | ORAL | Status: AC
  Administered 2013-07-24: 500 mg via ORAL
  Filled 2013-07-24: qty 1

## 2013-07-24 MED ORDER — ONDANSETRON HCL 4 MG/2ML IJ SOLN
4.0000 mg | Freq: Once | INTRAMUSCULAR | Status: AC
  Administered 2013-07-24: 4 mg via INTRAVENOUS
  Filled 2013-07-24: qty 2

## 2013-07-24 MED ORDER — VITAMIN D 1000 UNITS PO CAPS: 1000.0000 [IU] | ORAL_CAPSULE | Freq: Every morning | ORAL | Status: DC

## 2013-07-24 MED ORDER — NYSTATIN-TRIAMCINOLONE 100000-0.1 UNIT/GM-% EX CREA
1.0000 "application " | TOPICAL_CREAM | Freq: Every day | CUTANEOUS | Status: DC | PRN
  Filled 2013-07-24: qty 15

## 2013-07-24 MED ORDER — ONDANSETRON HCL 4 MG PO TABS: 4.0000 mg | ORAL_TABLET | Freq: Four times a day (QID) | ORAL | Status: DC | PRN

## 2013-07-24 MED ORDER — VITAMIN D3 25 MCG (1000 UNIT) PO TABS
1000.0000 [IU] | ORAL_TABLET | Freq: Every day | ORAL | Status: DC
  Administered 2013-07-25 – 2013-07-27 (×3): 1000 [IU] via ORAL
  Filled 2013-07-24 (×3): qty 1

## 2013-07-24 MED ORDER — ENOXAPARIN SODIUM 40 MG/0.4ML ~~LOC~~ SOLN
40.0000 mg | SUBCUTANEOUS | Status: DC
  Administered 2013-07-24 – 2013-07-26 (×3): 40 mg via SUBCUTANEOUS
  Filled 2013-07-24 (×4): qty 0.4

## 2013-07-24 MED ORDER — IOHEXOL 300 MG/ML  SOLN
50.0000 mL | Freq: Once | INTRAMUSCULAR | Status: AC | PRN
  Administered 2013-07-24: 50 mL via ORAL

## 2013-07-24 MED ORDER — ASPIRIN EC 81 MG PO TBEC
162.0000 mg | DELAYED_RELEASE_TABLET | Freq: Every morning | ORAL | Status: DC
  Administered 2013-07-25 – 2013-07-27 (×3): 162 mg via ORAL
  Filled 2013-07-24 (×3): qty 2

## 2013-07-24 MED ORDER — IOHEXOL 300 MG/ML  SOLN
100.0000 mL | Freq: Once | INTRAMUSCULAR | Status: AC | PRN
  Administered 2013-07-24: 100 mL via INTRAVENOUS

## 2013-07-24 MED ORDER — ACETAMINOPHEN 650 MG RE SUPP: 650.0000 mg | Freq: Four times a day (QID) | RECTAL | Status: DC | PRN

## 2013-07-24 MED ORDER — ACETAMINOPHEN 650 MG RE SUPP
650.0000 mg | Freq: Once | RECTAL | Status: AC
  Administered 2013-07-24: 650 mg via RECTAL
  Filled 2013-07-24: qty 1

## 2013-07-24 MED ORDER — MOMETASONE FUROATE 0.1 % EX CREA
1.0000 "application " | TOPICAL_CREAM | Freq: Two times a day (BID) | CUTANEOUS | Status: DC | PRN
  Filled 2013-07-24: qty 15

## 2013-07-24 MED ORDER — DEXTROSE 5 % IV SOLN
1.0000 g | Freq: Once | INTRAVENOUS | Status: AC
  Administered 2013-07-24: 1 g via INTRAVENOUS
  Filled 2013-07-24: qty 10

## 2013-07-24 MED ORDER — SODIUM CHLORIDE 0.9 % IJ SOLN
3.0000 mL | Freq: Two times a day (BID) | INTRAMUSCULAR | Status: DC
  Administered 2013-07-24 – 2013-07-25 (×2): 3 mL via INTRAVENOUS

## 2013-07-24 MED ORDER — ONDANSETRON HCL 4 MG/2ML IJ SOLN: 4.0000 mg | Freq: Four times a day (QID) | INTRAMUSCULAR | Status: DC | PRN

## 2013-07-24 MED ORDER — TIMOLOL MALEATE 0.5 % OP SOLN
1.0000 [drp] | Freq: Every day | OPHTHALMIC | Status: DC
  Administered 2013-07-25 – 2013-07-27 (×3): 1 [drp] via OPHTHALMIC
  Filled 2013-07-24: qty 5

## 2013-07-24 MED ORDER — DOCUSATE SODIUM 100 MG PO CAPS
100.0000 mg | ORAL_CAPSULE | Freq: Two times a day (BID) | ORAL | Status: DC
  Administered 2013-07-25 – 2013-07-27 (×3): 100 mg via ORAL
  Filled 2013-07-24 (×7): qty 1

## 2013-07-24 MED ORDER — SODIUM CHLORIDE 0.9 % IV SOLN
INTRAVENOUS | Status: AC
  Administered 2013-07-24: 20:00:00 via INTRAVENOUS

## 2013-07-24 MED ORDER — ACETAMINOPHEN 325 MG PO TABS
650.0000 mg | ORAL_TABLET | Freq: Four times a day (QID) | ORAL | Status: DC | PRN
  Filled 2013-07-24: qty 2

## 2013-07-24 MED ORDER — POLYETHYLENE GLYCOL 3350 17 G PO PACK
17.0000 g | PACK | Freq: Every day | ORAL | Status: DC | PRN
  Filled 2013-07-24: qty 1

## 2013-07-24 MED ORDER — TIMOLOL MALEATE (ONCE-DAILY) 0.5 % OP SOLN: 1.0000 [drp] | Freq: Every morning | OPHTHALMIC | Status: DC

## 2013-07-24 MED ORDER — TAMSULOSIN HCL 0.4 MG PO CAPS
0.8000 mg | ORAL_CAPSULE | Freq: Every morning | ORAL | Status: DC
  Administered 2013-07-25 – 2013-07-27 (×3): 0.8 mg via ORAL
  Filled 2013-07-24 (×3): qty 2

## 2013-07-24 MED ORDER — AZITHROMYCIN 250 MG PO TABS
250.0000 mg | ORAL_TABLET | Freq: Every day | ORAL | Status: DC
  Filled 2013-07-24: qty 1

## 2013-07-24 NOTE — ED Provider Notes (Signed)
CSN: [id number]     Arrival date & time 07/24/13  1613 History   First MD Initiated Contact with Patient 07/24/13 1614     No chief complaint on file.  (Consider location/radiation/quality/duration/timing/severity/associated sxs/prior Treatment) HPI Comments: Patient presents to the ED with a chief complaint of AMS.  Per nursing note: Pt from home.  Sudden onset of nausea and chills.  He ate lunch w/wife, both eating the same thing and she is fine.  Has a hernia to RLQ that is "larger than normal" per the wife.  Per EMS-pt started vomiting in ambulance.  196/80, 20, 117 ST,93%RA.  #18g LFA w/zofran 4mg  IV given by EMS.  Level 5 caveat applies 2/2 AMS.  The history is provided by medical records and the EMS personnel. No language interpreter was used.    Past Medical History  Diagnosis Date  . Dermatophytosis of nail   . Hyperlipidemia   . Hearing loss   . Hyperplasia of prostate without urinary obstruction   . Cataracts, bilateral   . Rosacea     Dr. Jason Nest  . Allergy   . Glaucoma     Dr. Lottie Dawson  . Corneal abrasion     Dr. Janean Sark, [facility]  . Sepsis due to Escherichia coli (E. coli) 05/2011    Morganella also cultured  . Diverticulosis   . TIA (transient ischemic attack) 9/6-05/2012    BP 221/99   Past Surgical History  Procedure Laterality Date  . Tonsillectomy and adenoidectomy    . Appendectomy      age 56  . Cataract extraction, bilateral  2003    Dr Cecilie Kicks (now seen @ [facility])  . Inguinal hernia repair      age 3  . Colonoscopy w/ polypectomy  06/2009    Dr.Jacobs; "miniscule polyp"  . Cholecystectomy  1978    stones   Family History  Problem Relation Age of Onset  . Stroke Mother 72  . Parkinsonism Brother     Brother died at age at age 11  . Pulmonary embolism Father 39    Post Op  . Diabetes Neg Hx   . Cancer Neg Hx   . COPD Neg Hx   . Heart disease Neg Hx    History  Substance Use Topics  . Smoking status: Never Smoker   . Smokeless tobacco: Not  on file  . Alcohol Use: No    Review of Systems  Unable to perform ROS: Mental status change    Allergies  Codeine; Minocycline hcl; Astelin; Ether; and Metoprolol  Home Medications   Current Outpatient Rx  Name  Route  Sig  Dispense  Refill  . aspirin 81 MG tablet   Oral   Take 162 mg by mouth daily.          . Cholecalciferol (VITAMIN D) 1000 UNITS capsule   Oral   Take 1,000 Units by mouth daily.           . hydrochlorothiazide (MICROZIDE) 12.5 MG capsule   Oral   Take 1 capsule (12.5 mg total) by mouth daily.   30 capsule   5   . mometasone (ELOCON) 0.1 % ointment   Topical   Apply topically 2 (two) times daily.   45 g   1   . Multiple Vitamin (MULTIVITAMIN WITH MINERALS) TABS   Oral   Take 1 tablet by mouth daily. Centrum Silver         . nystatin-triamcinolone (MYCOLOG II) cream  Topical   Apply topically as needed.   30 g   3   . simvastatin (ZOCOR) 20 MG tablet      1/2 qhs   90 tablet   0   . tamsulosin (FLOMAX) 0.4 MG CAPS      TAKE 1 CAPSULE BY MOUTH ONCE DAILY   90 capsule   1   . Timolol Maleate (ISTALOL) 0.5 % (DAILY) SOLN   Left Eye   Place 1 drop into the left eye. 1 drop daily          There were no vitals taken for this visit. Physical Exam  Nursing note and vitals reviewed. Constitutional: He is oriented to person, place, and time. He appears well-developed and well-nourished.  HENT:  Head: Normocephalic and atraumatic.  Right Ear: External ear normal.  Left Ear: External ear normal.  Nose: Nose normal.  Mouth/Throat: Oropharynx is clear and moist. No oropharyngeal exudate.  Eyes: Conjunctivae and EOM are normal. Pupils are equal, round, and reactive to light. Right eye exhibits no discharge. Left eye exhibits no discharge. No scleral icterus.  Neck: Normal range of motion. Neck supple. No JVD present.  Cardiovascular: Normal rate, regular rhythm, normal heart sounds and intact distal pulses.  Exam reveals no gallop  and no friction rub.   No murmur heard. Pulmonary/Chest: Effort normal and breath sounds normal. No respiratory distress. He has no wheezes. He has no rales. He exhibits no tenderness.  Abdominal: Soft. Bowel sounds are normal. He exhibits no distension and no mass. There is no tenderness. There is no rebound and no guarding.  Musculoskeletal: Normal range of motion. He exhibits no edema and no tenderness.  Neurological: He is alert and oriented to person, place, and time. He has normal reflexes.  CN 3-12 intact  Skin: Skin is warm and dry.  Psychiatric: He has a normal mood and affect. His behavior is normal. Judgment and thought content normal.    ED Course  Procedures (including critical care time) Labs Review Results for orders placed during the hospital encounter of 07/24/13  URINALYSIS, ROUTINE W REFLEX MICROSCOPIC      Result Value Range   Color, Urine YELLOW  YELLOW   APPearance CLEAR  CLEAR   Specific Gravity, Urine 1.016  1.005 - 1.030   pH 5.0  5.0 - 8.0   Glucose, UA NEGATIVE  NEGATIVE mg/dL   Hgb urine dipstick NEGATIVE  NEGATIVE   Bilirubin Urine NEGATIVE  NEGATIVE   Ketones, ur NEGATIVE  NEGATIVE mg/dL   Protein, ur NEGATIVE  NEGATIVE mg/dL   Urobilinogen, UA 0.2  0.0 - 1.0 mg/dL   Nitrite NEGATIVE  NEGATIVE   Leukocytes, UA NEGATIVE  NEGATIVE  CBC WITH DIFFERENTIAL      Result Value Range   WBC 3.7 (*) 4.0 - 10.5 K/uL   RBC 4.33  4.22 - 5.81 MIL/uL   Hemoglobin 14.1  13.0 - 17.0 g/dL   HCT 25.3  66.4 - 40.3 %   MCV 94.9  78.0 - 100.0 fL   MCH 32.6  26.0 - 34.0 pg   MCHC 34.3  30.0 - 36.0 g/dL   RDW 47.4  25.9 - 56.3 %   Platelets 147 (*) 150 - 400 K/uL   Neutrophils Relative % 93 (*) 43 - 77 %   Neutro Abs 3.5  1.7 - 7.7 K/uL   Lymphocytes Relative 5 (*) 12 - 46 %   Lymphs Abs 0.2 (*) 0.7 - 4.0 K/uL   Monocytes  Relative 1 (*) 3 - 12 %   Monocytes Absolute 0.0 (*) 0.1 - 1.0 K/uL   Eosinophils Relative 0  0 - 5 %   Eosinophils Absolute 0.0  0.0 - 0.7 K/uL    Basophils Relative 0  0 - 1 %   Basophils Absolute 0.0  0.0 - 0.1 K/uL  COMPREHENSIVE METABOLIC PANEL      Result Value Range   Sodium 137  135 - 145 mEq/L   Potassium 3.7  3.5 - 5.1 mEq/L   Chloride 103  96 - 112 mEq/L   CO2 23  19 - 32 mEq/L   Glucose, Bld 118 (*) 70 - 99 mg/dL   BUN 15  6 - 23 mg/dL   Creatinine, Ser 8.29  0.50 - 1.35 mg/dL   Calcium 9.3  8.4 - 56.2 mg/dL   Total Protein 6.6  6.0 - 8.3 g/dL   Albumin 3.5  3.5 - 5.2 g/dL   AST 35  0 - 37 U/L   ALT 22  0 - 53 U/L   Alkaline Phosphatase 52  39 - 117 U/L   Total Bilirubin 0.6  0.3 - 1.2 mg/dL   GFR calc non Af Amer 87 (*) >90 mL/min   GFR calc Af Amer >90  >90 mL/min  GLUCOSE, CAPILLARY      Result Value Range   Glucose-Capillary 119 (*) 70 - 99 mg/dL  CG4 I-STAT (LACTIC ACID)      Result Value Range   Lactic Acid, Venous 3.52 (*) 0.5 - 2.2 mmol/L  POCT I-STAT TROPONIN I      Result Value Range   Troponin i, poc 0.01  0.00 - 0.08 ng/mL   Comment 3            Dg Chest 2 View  07/24/2013   *RADIOLOGY REPORT*  Clinical Data: Shortness of breath.  CHEST - 2 VIEW  Comparison: 07/30/2012  Findings: The cardiomediastinal silhouette is unremarkable. This is a mildly low volume film. Posterior lower lobe opacity on the lateral view likely represents atelectasis but airspace disease/pneumonia is not excluded. There is no evidence of pleural effusion, pneumothorax or pulmonary mass.  IMPRESSION: Posterior lower lobe opacity in the lateral view - favor atelectasis over airspace disease/pneumonia.   Original Report Authenticated By: Harmon Pier, M.D.   CRITICAL CARE Performed by: Roxy Horseman   Total critical care time: 35  Critical care time was exclusive of separately billable procedures and treating other patients.  Critical care was necessary to treat or prevent imminent or life-threatening deterioration.  Critical care was time spent personally by me on the following activities: development of treatment plan  with patient and/or surrogate as well as nursing, discussions with consultants, evaluation of patient's response to treatment, examination of patient, obtaining history from patient or surrogate, ordering and performing treatments and interventions, ordering and review of laboratory studies, ordering and review of radiographic studies, pulse oximetry and re-evaluation of patient's condition.    MDM   1. Altered mental status    Patient presents with altered mental status. Concern for urosepsis, as the patient was admitted approximately one year ago for the same. It's, start Rocephin, and will check labs, blood cultures, chest, CT abdomen.  4:43 PM Patient seen immediately by myself and Dr. Estell Harpin.  Orders discussed and reviewed with Dr. Estell Harpin, who agrees with workup and treatment plan.  6:28 PM Patient discussed with the hospitalist, who will admit the patient.  Consider azithromycin if thinking that opacities are  pneumonia.  Will wait for CT abd/pel to result.  Discussed this with the hospitalist, who will evaluate.  Patient is currently stable, fever is coming down, BP is stable, HR is 104.      Roxy Horseman, PA-C 07/24/13 1830

## 2013-07-24 NOTE — ED Notes (Signed)
Bed: WA01 Expected date:  Expected time:  Means of arrival:  Comments: weakness 

## 2013-07-24 NOTE — H&P (Addendum)
[facility] History and Physical  Jack Noble ZOX:[id number] DOB: 10-Jan-1929 DOA: 07/24/2013  Referring physician:  Roxy Horseman PCP:  [name], MD   Chief Complaint:  Fever, rigors, chills  HPI:  The patient is a 79 y.o. year-old male with history of HTN, HLD, BPH with previous history of E. Coli sepsis, ventral hernia, and possible mild dementia who presents with sudden onset of fever, rigors, and chills.  The patient was last at their baseline health earlier today.  He states that other than increasing frequency of urination without dysuria, he has felt well recently.  Today, while watching TV he suddenly developed chills and shakes and severe fatigue.  He then vomited about three times, nonbilious, nonbloody material.  He denies abdominal pain, although his wife stated his abdominal hernia protruded more than usual this afternoon.  He denies cough, SOB, sinus congestion.  He had two normal BMs this morning, none since.  Denies skin infection or rash, headache, neck stiffness, photophobia, blurry vision.  He has not been around anyone sick. He has been a little more confused this afternoon with some increased word finding problems, but no somnolence.    In the ER, his temperature was initially 104.12F, HR in the 110s to 120s, BP 118/60, O2 sat 93% on RA.  Labs were notable for normal CMP, WBC of 3.7 (low with 93% neutrophils), hgb 14.1, plt 147.  UA was negative.  There was a suggestion of atelectasis vs. PNA on the lateral view on his CXR.  Head CT and abdominal CT demonstrated no source of fever.  He was given a dose of ceftriaxone empirically in the ER for "fever."    Review of Systems:  General:  +  fevers, chills, denies weight loss or gain HEENT:  Denies changes to hearing and vision, rhinorrhea, sinus congestion, sore throat CV:  Denies chest pain and palpitations, lower extremity edema.  PULM:  Denies SOB, wheezing, cough.   GI:  + nausea, vomiting, denies  constipation, diarrhea.   GU:  Denies dysuria, frequency, urgency ENDO:  Denies polyuria, polydipsia.   HEME:  Denies hematemesis, blood in stools, melena, abnormal bruising or bleeding.  LYMPH:  Denies lymphadenopathy.   MSK:  Denies arthralgias, myalgias.   DERM:  Denies skin rash or ulcer.   NEURO:  Denies focal numbness, weakness, slurred speech, confusion, facial droop.  PSYCH:  Denies anxiety and depression.    Past Medical History  Diagnosis Date  . Dermatophytosis of nail   . Hyperlipidemia   . Hearing loss   . Hyperplasia of prostate without urinary obstruction   . Cataracts, bilateral   . Rosacea     Dr. Jason Nest  . Allergy   . Glaucoma     Dr. Lottie Dawson  . Corneal abrasion     Dr. Janean Sark, [facility]  . Sepsis due to Escherichia coli (E. coli) 05/2011    Morganella also cultured  . Diverticulosis   . TIA (transient ischemic attack) 9/6-05/2012    BP 221/99  . Epistaxis    Past Surgical History  Procedure Laterality Date  . Tonsillectomy and adenoidectomy    . Appendectomy      age 40  . Cataract extraction, bilateral  2003    Dr Cecilie Kicks (now seen @ [facility])  . Inguinal hernia repair      age 66  . Colonoscopy w/ polypectomy  06/2009    Dr.Jacobs; "miniscule polyp"  . Cholecystectomy  1978    stones   Social  History:  reports that he has never smoked. He does not have any smokeless tobacco history on file. He reports that he does not drink alcohol or use illicit drugs. Lives with his wife of 58 years.    Allergies  Allergen Reactions  . Codeine     rash  . Minocycline Hcl     REACTION: nervous/chest pressure (Note: may have affected GE valve)  . Astelin [Azelastine Hcl]     Makes me jittery  . Ether     pneumonia  . Zocor [Simvastatin] Other (See Comments)    Rash, muscle problems and it affected his stomach  . Metoprolol     lightheaded    Family History  Problem Relation Age of Onset  . Stroke Mother 67  . Parkinsonism Brother     Brother died  at age at age 45  . Pulmonary embolism Father 79    Post Op  . Diabetes Neg Hx   . Cancer Neg Hx   . COPD Neg Hx   . Heart disease Neg Hx      Prior to Admission medications   Medication Sig Start Date End Date Taking? Authorizing Provider  aspirin EC 81 MG tablet Take 162 mg by mouth every morning.   Yes Historical Provider, MD  Cholecalciferol (VITAMIN D) 1000 UNITS capsule Take 1,000 Units by mouth every morning.    Yes Historical Provider, MD  mometasone (ELOCON) 0.1 % ointment Apply 1 application topically 2 (two) times daily as needed (Applies to affected area on hands.).   Yes Historical Provider, MD  Multiple Vitamin (MULTIVITAMIN WITH MINERALS) TABS Take 1 tablet by mouth every morning.    Yes Historical Provider, MD  nystatin-triamcinolone (MYCOLOG II) cream Apply 1 application topically daily as needed (Applies to red places in groin area.).   Yes Historical Provider, MD  tamsulosin (FLOMAX) 0.4 MG CAPS capsule Take 0.4 mg by mouth every morning.   Yes Historical Provider, MD  Timolol Maleate (ISTALOL) 0.5 % (DAILY) SOLN Place 1 drop into the left eye every morning.    Yes Historical Provider, MD   Physical Exam: Filed Vitals:   07/24/13 1617 07/24/13 1639 07/24/13 1803  BP: 157/73  118/60  Pulse: 118  104  Temp: 99.1 F (37.3 C) 104.1 F (40.1 C) 103 F (39.4 C)  TempSrc: Oral Rectal Rectal  Resp: 18  25  SpO2: 93%  94%     General:  CM, no acute distress, sitting up on stretcher  Eyes:  Right pupil slightly asymmetric but equal and reactive compared to left, anicteric, non-injected.  ENT:  Nares clear.  OP clear, non-erythematous without plaques or exudates.  MMM.  Neck:  Supple without TM or JVD.    Lymph:  No cervical, supraclavicular, or submandibular LAD.  Cardiovascular:  RRR, normal S1, S2, without m/r/g.  2+ pulses, warm extremities  Respiratory:   Persistent rales at the right base without wheezes or rhonchi, no increased WOB.  Abdomen:  NABS.   Soft, ND/NT.  Ventral hernia easily reducible.    Skin:  No rashes or focal lesions.  Musculoskeletal:  Normal bulk and tone.  Trace LE edema of feet.  Psychiatric:  A & O x 4.  Appropriate affect.  Neurologic:  CN 3-12 intact.  5/5 strength.  Sensation intact.  Labs on Admission:  Basic Metabolic Panel:  Recent Labs Lab 07/24/13 1645  NA 137  K 3.7  CL 103  CO2 23  GLUCOSE 118*  BUN 15  CREATININE 0.67  CALCIUM 9.3   Liver Function Tests:  Recent Labs Lab 07/24/13 1645  AST 35  ALT 22  ALKPHOS 52  BILITOT 0.6  PROT 6.6  ALBUMIN 3.5   No results found for this basename: LIPASE, AMYLASE,  in the last 168 hours No results found for this basename: AMMONIA,  in the last 168 hours CBC:  Recent Labs Lab 07/24/13 1645  WBC 3.7*  NEUTROABS 3.5  HGB 14.1  HCT 41.1  MCV 94.9  PLT 147*   Cardiac Enzymes: No results found for this basename: CKTOTAL, CKMB, CKMBINDEX, TROPONINI,  in the last 168 hours  BNP (last 3 results) No results found for this basename: PROBNP,  in the last 8760 hours CBG:  Recent Labs Lab 07/24/13 1638  GLUCAP 119*    Radiological Exams on Admission: Dg Chest 2 View  07/24/2013   *RADIOLOGY REPORT*  Clinical Data: Shortness of breath.  CHEST - 2 VIEW  Comparison: 07/30/2012  Findings: The cardiomediastinal silhouette is unremarkable. This is a mildly low volume film. Posterior lower lobe opacity on the lateral view likely represents atelectasis but airspace disease/pneumonia is not excluded. There is no evidence of pleural effusion, pneumothorax or pulmonary mass.  IMPRESSION: Posterior lower lobe opacity in the lateral view - favor atelectasis over airspace disease/pneumonia.   Original Report Authenticated By: Harmon Pier, M.D.   Ct Head W Contrast  07/24/2013   CLINICAL DATA:  79 year old male altered mental status. CT of the abdomen and pelvis with contrast performed 20 min earlier.  EXAM: CT HEAD WITHOUT CONTRAST  TECHNIQUE: Contiguous  axial images were obtained from the base of the skull through the vertex without intravenous contrast.  COMPARISON:  07/30/2012 and earlier.  FINDINGS: Visualized paranasal sinuses and mastoids are clear. No acute osseous abnormality identified. Stable orbit and scalp soft tissues.  Intravascular contrast is present from the exam 20 min earlier. Mild Calcified atherosclerosis at the skull base. Incidental choroid plexus cysts. No midline shift, ventriculomegaly, mass effect, evidence of mass lesion, intracranial hemorrhage or evidence of cortically based acute infarction. Minimal to mild for age subcortical white matter hypodensity. Otherwise gray -white matter differentiation is within normal limits throughout the brain. Stable cerebral volume. No abnormal enhancement identified.  IMPRESSION: Stable and negative for age CT appearance of the brain ; intravascular contrast is present from a contrast-enhanced CT Abdomen and Pelvis performed 20 minutes prior to this exam.   Electronically Signed   By: Augusto Gamble   On: 07/24/2013 19:06   Ct Abdomen Pelvis W Contrast  07/24/2013   CLINICAL DATA:  Vomiting, right lower quadrant hernia that has enlarged.  EXAM: CT ABDOMEN AND PELVIS WITH CONTRAST  TECHNIQUE: Multidetector CT imaging of the abdomen and pelvis was performed using the standard protocol following bolus administration of intravenous contrast.  CONTRAST:  50mL OMNIPAQUE IOHEXOL 300 MG/ML SOLN, OMNIPAQUE IOHEXOL 300 MG/ML SOLN  COMPARISON:  06/01/2011  FINDINGS: Dependent atelectasis in the lung bases. No effusions. Heart is normal size.  There are 2 midline ventral hernias noted, both containing fat only. Prior cholecystectomy. Liver, spleen, pancreas, adrenals and kidneys are unremarkable. No hydronephrosis.  Extensive and diffuse colonic diverticulosis. No active diverticulitis. Small bowel is decompressed.  Foley catheter is present in the bladder which is decompressed. No free fluid, free air or  adenopathy. Prostate calcifications and prominence.  No acute bony abnormality.  IMPRESSION: Two midline ventral hernias containing fat.  Diffuse colonic diverticulosis.   Electronically Signed   By:  Charlett Nose   On: 07/24/2013 18:47    EKG: Independently reviewed. NSR, inverted T-wave in aVL.    Assessment/Plan Principal Problem:   SIRS (systemic inflammatory response syndrome) Active Problems:   Rosacea   Glaucoma   Hypertension   Fever, unspecified   CAP (community acquired pneumonia)   BPH (benign prostatic hyperplasia)  SIRS, with possible CAP based on physical exam, although patient is remarkably asymptomatic.  DDx includes viral illness, including gastritis.   -  Blood cultures pending -  UA neg -  CXR as above -  Ceftriaxone and azithromycin -  Telemetry and IVF and antipyretics for now -  If patient becomes more ill (relative hypoTN for instance or persistent high fevers), escalate to vanc and cefepime.    Fever:  Tylenol as needed and cooling blanket as needed.  Likely related to infection.    Nausea and vomiting, since resolved.  CT abd neg for acute cause and may be related to PNA or high fever -  Zofran prn  Metabolic encephalopathy:  Patient does have some mild increased confusion, but no other hallmark signs of meningoencephalitis such as photophobia, nuchal rigidity, HA, or somnolence.  LIkely related to fever and other source of infection.    Sinus tachycardia, likely related to high fevers, SIRS. IVF and antipyretics   HTN, blood pressure only mildly elevated.  Trend.  HLD, on no medications currently.  Defer to outpatient management  BPH, increases risk for UTI, although UA was negative.  Increase flomax and D/C foley which was placed by ER.    Leukopenia with neutrophilia suggestive of severe acute bacterial infection with sequestration.   -  Trend  Mild thrombocytopenia, may be related to marrow suppression from acute illness.   -  Trend  Diet:   Regular (anticipate poor oral intake while feeling unwell) Access:  PIV IVF:  NS at 78ml/h  Proph:  lovenox  Code Status: DNR Family Communication: spoke with the patient and family friend Canary Brim who works at hospital Disposition Plan: Admit to stepdown for cooling blanket if needed  Time spent: 60 min Renae Fickle [facility] Pager [phone]  If 7PM-7AM, please contact night-coverage [email or web address] Password [facility] 07/24/2013, 7:56 PM

## 2013-07-24 NOTE — ED Notes (Signed)
Delay in transport due to physician at bedside.

## 2013-07-24 NOTE — ED Provider Notes (Signed)
Medical screening examination/treatment/procedure(s) were performed by non-physician practitioner and as supervising physician I was immediately available for consultation/collaboration.   Benny Lennert, MD 07/24/13 2156

## 2013-07-24 NOTE — ED Notes (Addendum)
Pt from home.  Sudden onset of nausea and chills.  He ate lunch w/wife, both eating the same thing and she is fine.  Has a hernia to RLQ that is "larger than normal" per the wife.  Per EMS-pt started vomiting in ambulance.  196/80, 20, 117 ST,93%RA.  #18g LFA w/zofran 4mg  IV given by EMS.

## 2013-07-25 ENCOUNTER — Encounter (HOSPITAL_COMMUNITY): Payer: Self-pay | Admitting: *Deleted

## 2013-07-25 DIAGNOSIS — R7881 Bacteremia: Secondary | ICD-10-CM | POA: Diagnosis present

## 2013-07-25 DIAGNOSIS — R509 Fever, unspecified: Secondary | ICD-10-CM

## 2013-07-25 LAB — COMPREHENSIVE METABOLIC PANEL
ALT: 84 U/L — ABNORMAL HIGH (ref 0–53)
AST: 96 U/L — ABNORMAL HIGH (ref 0–37)
Albumin: 2.8 g/dL — ABNORMAL LOW (ref 3.5–5.2)
CO2: 23 mEq/L (ref 19–32)
Calcium: 8.3 mg/dL — ABNORMAL LOW (ref 8.4–10.5)
Chloride: 107 mEq/L (ref 96–112)
Creatinine, Ser: 0.74 mg/dL (ref 0.50–1.35)
Sodium: 138 mEq/L (ref 135–145)
Total Bilirubin: 0.6 mg/dL (ref 0.3–1.2)

## 2013-07-25 LAB — CBC
Hemoglobin: 12.1 g/dL — ABNORMAL LOW (ref 13.0–17.0)
MCH: 33.2 pg (ref 26.0–34.0)
MCHC: 35 g/dL (ref 30.0–36.0)
MCV: 95.1 fL (ref 78.0–100.0)
RBC: 3.64 MIL/uL — ABNORMAL LOW (ref 4.22–5.81)

## 2013-07-25 MED ORDER — DEXTROSE 5 % IV SOLN
2.0000 g | Freq: Three times a day (TID) | INTRAVENOUS | Status: DC
  Administered 2013-07-25 – 2013-07-27 (×7): 2 g via INTRAVENOUS
  Filled 2013-07-25 (×11): qty 2

## 2013-07-25 MED ORDER — VANCOMYCIN HCL IN DEXTROSE 1-5 GM/200ML-% IV SOLN
1000.0000 mg | Freq: Two times a day (BID) | INTRAVENOUS | Status: DC
  Filled 2013-07-25: qty 200

## 2013-07-25 MED ORDER — POTASSIUM CHLORIDE CRYS ER 20 MEQ PO TBCR
40.0000 meq | EXTENDED_RELEASE_TABLET | Freq: Once | ORAL | Status: AC
  Administered 2013-07-25: 40 meq via ORAL
  Filled 2013-07-25: qty 2

## 2013-07-25 NOTE — Evaluation (Signed)
Physical Therapy Evaluation Patient Details Name: Jack Noble MRN: [id number] DOB: 11/14/1929 Today's Date: 07/25/2013 Time: 6213-0865 PT Time Calculation (min): 26 min  PT Assessment / Plan / Recommendation History of Present Illness  patient is a 79 y.o. year-old male admitted 07/24/13 with history of HTN, HLD, BPH with previous history of E. Coli sepsis, ventral hernia, and possible mild dementia who presents with sudden onset of fever, rigors, and chills.  He states that other than increasing frequency of urination without dysuria, he has felt well recently. Has some increased word finding problems, but no somnolence.    Clinical Impression  Pt tolerated ambulation in hall x 275". Pt noted with soe expressive difficulties. Pt will benefit from PT while in acute care. Will reassess next visit for [facility] needs. Pt may not require.    PT Assessment  Patient needs continued PT services    Follow Up Recommendations  No PT follow up    Does the patient have the potential to tolerate intense rehabilitation      Barriers to Discharge        Equipment Recommendations  None recommended by PT    Recommendations for Other Services     Frequency Min 3X/week    Precautions / Restrictions Precautions Precautions: Fall   Pertinent Vitals/Pain HR 79. sats 96% RA, not dyspnea. Initially stated dizziness which subsided.      Mobility  Bed Mobility Bed Mobility: Supine to Sit;Sitting - Scoot to Edge of Bed Supine to Sit: 5: Supervision Sitting - Scoot to Edge of Bed: 5: Supervision Transfers Transfers: Sit to Stand;Stand to Sit Sit to Stand: From bed;With upper extremity assist;4: Min guard Stand to Sit: With armrests;To chair/3-in-1;5: Supervision Ambulation/Gait Ambulation/Gait Assistance: 4: Min guard Ambulation Distance (Feet): 275 Feet Assistive device: Rolling walker Gait Pattern: Within Functional Limits    Exercises     PT Diagnosis: Difficulty walking;Altered mental  status;Generalized weakness  PT Problem List: Decreased activity tolerance;Decreased mobility;Decreased cognition;Decreased knowledge of precautions;Decreased strength PT Treatment Interventions: DME instruction;Gait training;Functional mobility training;Therapeutic activities;Therapeutic exercise;Patient/family education     PT Goals(Current goals can be found in the care plan section) Acute Rehab PT Goals Patient Stated Goal: To get up and walk PT Goal Formulation: With patient/family Time For Goal Achievement: 08/08/13 Potential to Achieve Goals: Good  Visit Information  Last PT Received On: 07/25/13 Assistance Needed: +1 History of Present Illness: patient is a 78 y.o. year-old male admitted 07/24/13 with history of HTN, HLD, BPH with previous history of E. Coli sepsis, ventral hernia, and possible mild dementia who presents with sudden onset of fever, rigors, and chills.  He states that other than increasing frequency of urination without dysuria, he has felt well recently. Has some increased word finding problems, but no somnolence.         Prior Functioning  Home Living Family/patient expects to be discharged to:: Private residence Living Arrangements: Spouse/significant other Available Help at Discharge: Family Type of Home: House Home Access: Level entry Home Layout: Two level;Able to live on main level with bedroom/bathroom Home Equipment: None Prior Function Level of Independence: Independent Communication Communication: Expressive difficulties (some  word finding difficulties.)    Cognition  Cognition Arousal/Alertness: Awake/alert Behavior During Therapy: WFL for tasks assessed/performed Overall Cognitive Status: Impaired/Different from baseline Area of Impairment: Orientation;Memory Orientation Level: Time    Extremity/Trunk Assessment Upper Extremity Assessment Upper Extremity Assessment: Overall WFL for tasks assessed Lower Extremity Assessment Lower  Extremity Assessment: Overall WFL for tasks assessed  Balance Balance Balance Assessed: Yes Static Sitting Balance Static Sitting - Balance Support: No upper extremity supported Static Sitting - Level of Assistance: 5: Stand by assistance  End of Session PT - End of Session Activity Tolerance: Patient tolerated treatment well Patient left: in chair;with call bell/phone within reach;with family/visitor present Nurse Communication: Mobility status  GP     Rada Hay  07/25/2013, 10:47 AM Blanchard Kelch PT [phone]

## 2013-07-25 NOTE — Telephone Encounter (Signed)
Patient is currently at the hospital, will wait until his followup.

## 2013-07-25 NOTE — Progress Notes (Signed)
[facility] PROGRESS NOTE  Jack Noble ZOX:[id number] DOB: 07-14-1929 DOA: 07/24/2013 PCP: [name], MD  Assessment/Plan:  Gram Negative Bacteremia -Source unclear. -Was admitted about 1 year ago with the same issue and source was thought to be prostatitis. -Urine is negative. -Continue cefepime. Will DC vanc and azithro.  Fever/Leukocytosis/Severe Sepsis -From above. -Seems to be defervescing.  HTN -Controlled at present.  Hyperlipidemia -Not on medications.  BPH -Continue flomax.  Acute Encephalopathy -Completely resolved at present.    Code Status: Full Code Family Communication: Wife [name] at bedside updated on plan of care.  Disposition Plan: To floor today. PT/OT evals.   Consultants:  None   Antibiotics:  Vancomycin   Subjective: Feels well. Would like foley removed.  Objective: Filed Vitals:   07/25/13 0600 07/25/13 0800 07/25/13 0900 07/25/13 1014  BP: 117/54  113/58 104/52  Pulse: 69  73 63  Temp:  98 F (36.7 C)  97.6 F (36.4 C)  TempSrc:  Oral  Oral  Resp: 18  21 14   Height:      Weight:      SpO2: 95%  97% 98%    Intake/Output Summary (Last 24 hours) at 07/25/13 1446 Last data filed at 07/25/13 1000  Gross per 24 hour  Intake 3092.5 ml  Output   1773 ml  Net 1319.5 ml   Filed Weights   07/24/13 2000 07/25/13 0400  Weight: 74.2 kg (163 lb 9.3 oz) 76.2 kg (167 lb 15.9 oz)    Exam:   General:  AA Ox3, NAD  Cardiovascular: RRR  Respiratory: CTA B  Abdomen: S/NT/ND/+BS  Extremities: no C/C/E   Neurologic:  grossly intact and non-focal.  Data Reviewed: Basic Metabolic Panel:  Recent Labs Lab 07/24/13 1645 07/25/13 0325  NA 137 138  K 3.7 3.2*  CL 103 107  CO2 23 23  GLUCOSE 118* 144*  BUN 15 13  CREATININE 0.67 0.74  CALCIUM 9.3 8.3*   Liver Function Tests:  Recent Labs Lab 07/24/13 1645 07/25/13 0325  AST 35 96*  ALT 22 84*  ALKPHOS 52 38*  BILITOT 0.6 0.6  PROT 6.6 5.3*   ALBUMIN 3.5 2.8*   No results found for this basename: LIPASE, AMYLASE,  in the last 168 hours No results found for this basename: AMMONIA,  in the last 168 hours CBC:  Recent Labs Lab 07/24/13 1645 07/25/13 0325  WBC 3.7* 20.3*  NEUTROABS 3.5  --   HGB 14.1 12.1*  HCT 41.1 34.6*  MCV 94.9 95.1  PLT 147* 137*   Cardiac Enzymes: No results found for this basename: CKTOTAL, CKMB, CKMBINDEX, TROPONINI,  in the last 168 hours BNP (last 3 results) No results found for this basename: PROBNP,  in the last 8760 hours CBG:  Recent Labs Lab 07/24/13 1638  GLUCAP 119*    Recent Results (from the past 240 hour(s))  CULTURE, BLOOD (ROUTINE X 2)     Status: None   Collection Time    07/24/13  4:35 PM      Result Value Range Status   Specimen Description BLOOD LEFT ARM   Final   Special Requests BOTTLES DRAWN AEROBIC AND ANAEROBIC   Final   Culture  Setup Time     Final   Value: 07/24/2013 18:53     Performed at [facility]   Culture     Final   Value: GRAM NEGATIVE RODS     Note: Gram Stain Report Called to,Read Back By and  Verified With: RONNIE STERN AT 06:26 A.M. ON 07/25/2013 WARRB     Performed at [facility]   Report Status PENDING   Incomplete  CULTURE, BLOOD (ROUTINE X 2)     Status: None   Collection Time    07/24/13  4:46 PM      Result Value Range Status   Specimen Description BLOOD RIGHT ARM   Final   Special Requests BOTTLES DRAWN AEROBIC ONLY   Final   Culture  Setup Time     Final   Value: 07/24/2013 18:54     Performed at [facility]   Culture     Final   Value: GRAM NEGATIVE RODS     Note: Gram Stain Report Called to,Read Back By and Verified With: SEAN G@0912  ON [id number] BY [facility]     Performed at [facility]   Report Status PENDING   Incomplete  MRSA PCR SCREENING     Status: None   Collection Time    07/24/13  8:10 PM      Result Value Range Status   MRSA by PCR NEGATIVE  NEGATIVE Final   Comment:             The GeneXpert MRSA Assay (FDA     approved for NASAL specimens     only), is one component of a     comprehensive MRSA colonization     surveillance program. It is not     intended to diagnose MRSA     infection nor to guide or     monitor treatment for     MRSA infections.     Studies: Dg Chest 2 View  07/24/2013   *RADIOLOGY REPORT*  Clinical Data: Shortness of breath.  CHEST - 2 VIEW  Comparison: 07/30/2012  Findings: The cardiomediastinal silhouette is unremarkable. This is a mildly low volume film. Posterior lower lobe opacity on the lateral view likely represents atelectasis but airspace disease/pneumonia is not excluded. There is no evidence of pleural effusion, pneumothorax or pulmonary mass.  IMPRESSION: Posterior lower lobe opacity in the lateral view - favor atelectasis over airspace disease/pneumonia.   Original Report Authenticated By: Harmon Pier, M.D.   Ct Head W Contrast  07/24/2013   CLINICAL DATA:  79 year old male altered mental status. CT of the abdomen and pelvis with contrast performed 20 min earlier.  EXAM: CT HEAD WITHOUT CONTRAST  TECHNIQUE: Contiguous axial images were obtained from the base of the skull through the vertex without intravenous contrast.  COMPARISON:  07/30/2012 and earlier.  FINDINGS: Visualized paranasal sinuses and mastoids are clear. No acute osseous abnormality identified. Stable orbit and scalp soft tissues.  Intravascular contrast is present from the exam 20 min earlier. Mild Calcified atherosclerosis at the skull base. Incidental choroid plexus cysts. No midline shift, ventriculomegaly, mass effect, evidence of mass lesion, intracranial hemorrhage or evidence of cortically based acute infarction. Minimal to mild for age subcortical white matter hypodensity. Otherwise gray -white matter differentiation is within normal limits throughout the brain. Stable cerebral volume. No abnormal enhancement identified.  IMPRESSION: Stable and negative for age CT  appearance of the brain ; intravascular contrast is present from a contrast-enhanced CT Abdomen and Pelvis performed 20 minutes prior to this exam.   Electronically Signed   By: Augusto Gamble   On: 07/24/2013 19:06   Ct Abdomen Pelvis W Contrast  07/24/2013   CLINICAL DATA:  Vomiting, right lower quadrant hernia that has enlarged.  EXAM: CT ABDOMEN  AND PELVIS WITH CONTRAST  TECHNIQUE: Multidetector CT imaging of the abdomen and pelvis was performed using the standard protocol following bolus administration of intravenous contrast.  CONTRAST:  50mL OMNIPAQUE IOHEXOL 300 MG/ML SOLN, OMNIPAQUE IOHEXOL 300 MG/ML SOLN  COMPARISON:  06/01/2011  FINDINGS: Dependent atelectasis in the lung bases. No effusions. Heart is normal size.  There are 2 midline ventral hernias noted, both containing fat only. Prior cholecystectomy. Liver, spleen, pancreas, adrenals and kidneys are unremarkable. No hydronephrosis.  Extensive and diffuse colonic diverticulosis. No active diverticulitis. Small bowel is decompressed.  Foley catheter is present in the bladder which is decompressed. No free fluid, free air or adenopathy. Prostate calcifications and prominence.  No acute bony abnormality.  IMPRESSION: Two midline ventral hernias containing fat.  Diffuse colonic diverticulosis.   Electronically Signed   By: Charlett Nose   On: 07/24/2013 18:47    Scheduled Meds: . aspirin EC  162 mg Oral q morning - 10a  . ceFEPime (MAXIPIME) IV  2 g Intravenous Q8H  . cholecalciferol  1,000 Units Oral Daily  . docusate sodium  100 mg Oral BID  . enoxaparin (LOVENOX) injection  40 mg Subcutaneous Q24H  . multivitamin with minerals  1 tablet Oral q morning - 10a  . sodium chloride  3 mL Intravenous Q12H  . tamsulosin  0.8 mg Oral q morning - 10a  . timolol  1 drop Left Eye Daily   Continuous Infusions: . sodium chloride 75 mL/hr at 07/24/13 2014    Principal Problem:   Gram-negative bacteremia Active Problems:   Severe sepsis    Rosacea   Glaucoma   Hypertension   Febrile illness, acute   CAP (community acquired pneumonia)   BPH (benign prostatic hyperplasia)    Time spent: 35 minutes.    Chaya Jan  [facility] Pager [phone]  If 7PM-7AM, please contact night-coverage at [email or web address], password [facility] 07/25/2013, 2:46 PM  LOS: 1 day

## 2013-07-25 NOTE — Progress Notes (Signed)
ANTIBIOTIC CONSULT NOTE - INITIAL  Pharmacy Consult for Vancomycin/Cefepime Indication: suspected pneumonia, gram negative bacteremia  Allergies  Allergen Reactions  . Codeine     rash  . Minocycline Hcl     REACTION: nervous/chest pressure (Note: may have affected GE valve)  . Astelin [Azelastine Hcl]     Makes me jittery  . Ether     pneumonia  . Zocor [Simvastatin] Other (See Comments)    Rash, muscle problems and it affected his stomach  . Metoprolol     lightheaded    Patient Measurements: Height: 5\' 6"  (167.6 cm) Weight: 167 lb 15.9 oz (76.2 kg) IBW/kg (Calculated) : 63.8  Vital Signs: Temp: 98.7 F (37.1 C) (09/02 0400) Temp src: Oral (09/02 0400) BP: 117/54 mmHg (09/02 0600) Pulse Rate: 69 (09/02 0600) Intake/Output from previous day: 09/01 0701 - 09/02 0700 In: 2742.5 [I.V.:2742.5] Out: 1423 [Urine:1420; Stool:3] Intake/Output from this shift:    Labs:  Recent Labs  07/24/13 1645 07/25/13 0325  WBC 3.7* 20.3*  HGB 14.1 12.1*  PLT 147* 137*  CREATININE 0.67 0.74   Estimated Creatinine Clearance: 64.2 ml/min (by C-G formula based on Cr of 0.74). No results found for this basename: VANCOTROUGH, Leodis Binet, VANCORANDOM, GENTTROUGH, GENTPEAK, GENTRANDOM, TOBRATROUGH, TOBRAPEAK, TOBRARND, AMIKACINPEAK, AMIKACINTROU, AMIKACIN,  in the last 72 hours   Microbiology: Recent Results (from the past 720 hour(s))  CULTURE, BLOOD (ROUTINE X 2)     Status: None   Collection Time    07/24/13  4:35 PM      Result Value Range Status   Specimen Description BLOOD LEFT ARM   Final   Special Requests BOTTLES DRAWN AEROBIC AND ANAEROBIC   Final   Culture  Setup Time     Final   Value: 07/24/2013 18:53     Performed at [facility]   Culture     Final   Value: GRAM NEGATIVE RODS     Note: Gram Stain Report Called to,Read Back By and Verified With: RONNIE STERN AT 06:26 A.M. ON 07/25/2013 WARRB     Performed at [facility]   Report Status PENDING    Incomplete  MRSA PCR SCREENING     Status: None   Collection Time    07/24/13  8:10 PM      Result Value Range Status   MRSA by PCR NEGATIVE  NEGATIVE Final   Comment:            The GeneXpert MRSA Assay (FDA     approved for NASAL specimens     only), is one component of a     comprehensive MRSA colonization     surveillance program. It is not     intended to diagnose MRSA     infection nor to guide or     monitor treatment for     MRSA infections.    Medical History: Past Medical History  Diagnosis Date  . Dermatophytosis of nail   . Hyperlipidemia   . Hearing loss   . Hyperplasia of prostate without urinary obstruction   . Cataracts, bilateral   . Rosacea     Dr. Jason Nest  . Allergy   . Glaucoma     Dr. Lottie Dawson  . Corneal abrasion     Dr. Janean Sark, [facility] Ophth  . Sepsis due to Escherichia coli (E. coli) 05/2011    Morganella also cultured  . Diverticulosis   . TIA (transient ischemic attack) 9/6-05/2012    BP 221/99  .  Epistaxis      Assessment: 61 yoM with history of E.coli and Morganella morganii sepsis presented with sudden onset fever, rigors, chills.  Ceftriaxone and azithromycin started 9/1 for SIRS with possible CAP.  Today 1 of 2 blood cultures growing GNR.  Antibiotics broadened to Cefepime and Vancomycin for GNR bacteremia as well as cover for suspected pneumonia.  WBC increasing, 20.3 < 3.7  AF  SCr 0.74, CrCl~72 ml/min/1.59m2 (normalized)  Previous morganella and E.coli blood cultures (from 05/2013) were both sensitive to Cefepime.  Goal of Therapy:  Vancomycin trough level 15-20 mcg/ml Doses adjusted per renal clearance Eradication of infection  Plan:  Vancomycin 1g IV q12h. Cefepime 2g IV q8h. F/u SCr, trough levels, and culture results.  Clance Boll 07/25/2013,7:09 AM

## 2013-07-26 ENCOUNTER — Telehealth: Payer: Self-pay | Admitting: *Deleted

## 2013-07-26 DIAGNOSIS — B9689 Other specified bacterial agents as the cause of diseases classified elsewhere: Secondary | ICD-10-CM

## 2013-07-26 DIAGNOSIS — D72829 Elevated white blood cell count, unspecified: Secondary | ICD-10-CM | POA: Diagnosis present

## 2013-07-26 DIAGNOSIS — A419 Sepsis, unspecified organism: Secondary | ICD-10-CM

## 2013-07-26 DIAGNOSIS — D696 Thrombocytopenia, unspecified: Secondary | ICD-10-CM | POA: Diagnosis not present

## 2013-07-26 DIAGNOSIS — D649 Anemia, unspecified: Secondary | ICD-10-CM | POA: Diagnosis not present

## 2013-07-26 DIAGNOSIS — R7881 Bacteremia: Secondary | ICD-10-CM

## 2013-07-26 LAB — CBC
HCT: 34.3 % — ABNORMAL LOW (ref 39.0–52.0)
MCH: 32.7 pg (ref 26.0–34.0)
MCHC: 34.4 g/dL (ref 30.0–36.0)
MCV: 95 fL (ref 78.0–100.0)
Platelets: 110 10*3/uL — ABNORMAL LOW (ref 150–400)
RDW: 13.3 % (ref 11.5–15.5)

## 2013-07-26 LAB — BASIC METABOLIC PANEL
BUN: 12 mg/dL (ref 6–23)
CO2: 24 mEq/L (ref 19–32)
Calcium: 8.2 mg/dL — ABNORMAL LOW (ref 8.4–10.5)
Chloride: 110 mEq/L (ref 96–112)
Creatinine, Ser: 0.67 mg/dL (ref 0.50–1.35)
Glucose, Bld: 107 mg/dL — ABNORMAL HIGH (ref 70–99)

## 2013-07-26 NOTE — Care Management Note (Addendum)
    Page 1 of 2   07/27/2013     2:37:30 PM   CARE MANAGEMENT NOTE 07/27/2013  Patient:  Jack Noble, Jack Noble   Account Number:  [id number]  Date Initiated:  07/25/2013  Documentation initiated by:  DAVIS,RHONDA  Subjective/Objective Assessment:   pt with sirs     Action/Plan:   home   Anticipated DC Date:  07/27/2013   Anticipated DC Plan:  HOME/SELF CARE  In-house referral  NA      DC Planning Services  CM consult      PAC Choice  NA   Choice offered to / List presented to:  NA   DME arranged  NA      DME agency  NA     HH arranged  NA      HH agency  NA   Status of service:  Completed, signed off Medicare Important Message given?  NA - LOS <3 / Initial given by admissions (If response is "NO", the following Medicare IM given date fields will be blank) Date Medicare IM given:   Date Additional Medicare IM given:    Discharge Disposition:  HOME/SELF CARE  Per UR Regulation:  Reviewed for med. necessity/level of care/duration of stay  If discussed at Long Length of Stay Meetings, dates discussed:    Comments:  07/27/13 Jack Wilden RN,BSN NCM [phone] D/C HOME NO NEEDS OR ORDERS.  07/26/13 Jack Marxen RN,BSN NCM [phone] TRANSFER FROM SDU.PT-NO F/U.  [id number] Davis,RN,BSn,CCM

## 2013-07-26 NOTE — Consult Note (Signed)
[facility]    Date of Admission:  07/24/2013           Day 3 antibiotics        Day 2 cefepime       Reason for Consult: Gram-negative rod bacteremia    Referring Physician: Dr. Marcellus Scott  Principal Problem:   Gram-negative bacteremia Active Problems:   Severe sepsis   Rosacea   Glaucoma   Hypertension   BPH (benign prostatic hyperplasia)   Leukocytosis, unspecified   Anemia   Thrombocytopenia, unspecified   . aspirin EC  162 mg Oral q morning - 10a  . ceFEPime (MAXIPIME) IV  2 g Intravenous Q8H  . cholecalciferol  1,000 Units Oral Daily  . docusate sodium  100 mg Oral BID  . enoxaparin (LOVENOX) injection  40 mg Subcutaneous Q24H  . multivitamin with minerals  1 tablet Oral q morning - 10a  . sodium chloride  3 mL Intravenous Q12H  . tamsulosin  0.8 mg Oral q morning - 10a  . timolol  1 drop Left Eye Daily    Recommendations: 1. Continue cefepime pending final blood culture results   Assessment: He is improving on empiric therapy for gram-negative rod bacteremia. Although his urinalysis was normal I am still suspicious that he may have a urinary tract infection as a source for his bacteremia. He has some atelectasis on his chest x-ray and a CT scan but no evidence of pneumonia. He had some diverticulosis but no evidence of diverticulitis. Hopefully his organism will be susceptible to oral antibiotics allowing [place] to complete a 14 day course of therapy as an outpatient. I will followup tomorrow.   HPI: Jack Noble is a 79 y.o. male who was in his usual state of health until 3 days ago when he had the sudden onset of rigors, fever and weakness. His wife called EMS and he was brought directly here for admission. Admission blood cultures are growing gram-negative rods.  He is feeling better on empiric cefepime. His admission urinalysis was normal but he began having urinary frequency without dysuria several hours before onset  of his fever and chills.  Review of Systems: Constitutional: positive for chills, fatigue and fevers, negative for sweats and weight loss Eyes: negative Ears, nose, mouth, throat, and face: negative Respiratory: negative Cardiovascular: negative Gastrointestinal: negative Genitourinary:positive for frequency, negative for dysuria and hematuria Integument/breast: negative  Past Medical History  Diagnosis Date  . Dermatophytosis of nail   . Hyperlipidemia   . Hearing loss   . Hyperplasia of prostate without urinary obstruction   . Cataracts, bilateral   . Rosacea     Dr. Jason Nest  . Allergy   . Glaucoma     Dr. Lottie Dawson  . Corneal abrasion     Dr. Janean Sark, [facility] Ophth  . Sepsis due to Escherichia coli (E. coli) 05/2011    Morganella also cultured  . Diverticulosis   . TIA (transient ischemic attack) 9/6-05/2012    BP 221/99  . Epistaxis     History  Substance Use Topics  . Smoking status: Never Smoker   . Smokeless tobacco: Not on file  . Alcohol Use: No    Family History  Problem Relation Age of Onset  . Stroke Mother 86  . Parkinsonism Brother     Brother died at age at age 30  . Pulmonary embolism Father 53    Post Op  . Diabetes Neg  Hx   . Cancer Neg Hx   . COPD Neg Hx   . Heart disease Neg Hx    Allergies  Allergen Reactions  . Codeine     rash  . Minocycline Hcl     REACTION: nervous/chest pressure (Note: may have affected GE valve)  . Astelin [Azelastine Hcl]     Makes me jittery  . Ether     pneumonia  . Zocor [Simvastatin] Other (See Comments)    Rash, muscle problems and it affected his stomach  . Metoprolol     lightheaded    OBJECTIVE: Blood pressure 145/67, pulse 78, temperature 97.7 F (36.5 C), temperature source Oral, resp. rate 16, height 5\' 6"  (1.676 m), weight 76.2 kg (167 lb 15.9 oz), SpO2 97.00%. General: He is alert and in no distress Skin: He has some dry patches behind both ears but otherwise no acute rash Oral: No oropharyngeal  lesions Eyes: Normal external exam Neck: Supple Lungs: Clear Cor: Regular S1 and S2 no murmurs Abdomen: Soft and nontender with a midline ventral hernia. He has no suprapubic or CVA tenderness Joints and extremities: No acute abnormalities Neuro: He is somewhat forgetful and frequently defers to his wife for answers  Microbiology: Recent Results (from the past 240 hour(s))  CULTURE, BLOOD (ROUTINE X 2)     Status: None   Collection Time    07/24/13  4:35 PM      Result Value Range Status   Specimen Description BLOOD LEFT ARM   Final   Special Requests BOTTLES DRAWN AEROBIC AND ANAEROBIC   Final   Culture  Setup Time     Final   Value: 07/24/2013 18:53     Performed at [facility]   Culture     Final   Value: GRAM NEGATIVE RODS     Note: Gram Stain Report Called to,Read Back By and Verified With: RONNIE STERN AT 06:26 A.M. ON 07/25/2013 WARRB     Performed at [facility]   Report Status PENDING   Incomplete  CULTURE, BLOOD (ROUTINE X 2)     Status: None   Collection Time    07/24/13  4:46 PM      Result Value Range Status   Specimen Description BLOOD RIGHT ARM   Final   Special Requests BOTTLES DRAWN AEROBIC ONLY   Final   Culture  Setup Time     Final   Value: 07/24/2013 18:54     Performed at [facility]   Culture     Final   Value: GRAM NEGATIVE RODS     Note: Gram Stain Report Called to,Read Back By and Verified With: SEAN G@0912  ON [id number] BY [facility]     Performed at [facility]   Report Status PENDING   Incomplete  MRSA PCR SCREENING     Status: None   Collection Time    07/24/13  8:10 PM      Result Value Range Status   MRSA by PCR NEGATIVE  NEGATIVE Final   Comment:            The GeneXpert MRSA Assay (FDA     approved for NASAL specimens     only), is one component of a     comprehensive MRSA colonization     surveillance program. It is not     intended to diagnose MRSA     infection nor to guide or     monitor  treatment for  MRSA infections.    Cliffton Asters, MD [facility] [phone] pager   [phone] cell 07/26/2013, 4:17 PM

## 2013-07-26 NOTE — Telephone Encounter (Signed)
Message copied by Verdie Shire on Wed Jul 26, 2013  9:12 AM ------      Message from: Pecola Lawless      Created: Tue Jul 25, 2013  1:10 AM       Please call & tell [name] that Husayn is in my thoughts & prayers and that I shall follow his care even while out of the country. Thanks, Hopp ------

## 2013-07-26 NOTE — Telephone Encounter (Signed)
Lm @ (9:14am) asking the pt's wife to RTC regarding note below.//AB/CMA

## 2013-07-26 NOTE — Progress Notes (Signed)
[facility] PROGRESS NOTE  Jack Noble ZOX:[id number] DOB: 02/07/1929 DOA: 07/24/2013 PCP: [name], MD  Brief narrative 79 y.o. year-old male with history of HTN, HLD, BPH with previous history of E. Coli sepsis, ventral hernia, and possible mild dementia who presented with sudden onset of fever, rigors, and chills.  He states that other than increasing frequency of urination without dysuria, he has felt well recently. In the ER, his temperature was initially 104.23F, HR in the 110s to 120s, BP 118/60, O2 sat 93% on RA. Labs were notable for normal CMP, WBC of 3.7 (low with 93% neutrophils), hgb 14.1, plt 147. UA was negative. There was a suggestion of atelectasis vs. PNA on the lateral view on his CXR. Head CT and abdominal CT demonstrated no source of fever. He was given a dose of ceftriaxone empirically in the ER for "fever."    Assessment/Plan:  Gram Negative Bacteremia/severe sepsis, present on admission -Source unclear. 2 x 2 blood cultures positive. Final speciation and sensitivities pending. -Was admitted about 1 year ago with the same issue and source was thought to be prostatitis. Completed 6 weeks of antibiotics/PICC line. - DD: GU (prostatitis), GI (biliary, diverticular) versus others -Urine culture is negative. Foley catheter was placed and removed. -Continue cefepime. DC'ed vanc and azithro. - Repeat surveillance blood cultures 9/3 - Infectious disease consulted for assistance.  Leukocytosis -From above. - decreasing. Follow CBCs.  Anemia & Thrombocytopenia - ? Related to problem #1. Follow CBCs closely.  HTN -Controlled at present.  Hyperlipidemia -Not on medications.  BPH -Continue flomax.  Acute Encephalopathy -Completely resolved at present.    Code Status: Full Code Family Communication: Wife [name] at bedside updated on plan of care.  Disposition Plan: Home when medically stable.   Consultants:  Infectious  disease  Antibiotics:  IV cefepime  Subjective: Unable to sleep well last night. Wants to telemetry DC'd. Mild intermittent dysuria but decreasing. Denies deep rectal pain.  Objective: Filed Vitals:   07/25/13 0900 07/25/13 1014 07/25/13 2155 07/26/13 0546  BP: 113/58 104/52 133/65 143/63  Pulse: 73 63 74 73  Temp:  97.6 F (36.4 C) 97.6 F (36.4 C) 99.1 F (37.3 C)  TempSrc:  Oral Oral Oral  Resp: 21 14 16 16   Height:      Weight:      SpO2: 97% 98% 96% 97%    Intake/Output Summary (Last 24 hours) at 07/26/13 1329 Last data filed at 07/26/13 1047  Gross per 24 hour  Intake    150 ml  Output   1000 ml  Net   -850 ml   Filed Weights   07/24/13 2000 07/25/13 0400  Weight: 74.2 kg (163 lb 9.3 oz) 76.2 kg (167 lb 15.9 oz)    Exam:   General:  Elderly moderately built and nourished male patient sitting up comfortably at age of. Pleasant.  Cardiovascular: RRR. No JVD, murmurs or pedal edema. Telemetry: Sinus rhythm.  Respiratory: CTAB. No increased work of breathing.  Abdomen: S/NT/ND/+BS. Large uncomplicated right-sided ventral hernia.  Extremities: no C/C/E   Neurologic:  grossly intact and non-focal. Alert and oriented x3.  Data Reviewed: Basic Metabolic Panel:  Recent Labs Lab 07/24/13 1645 07/25/13 0325 07/26/13 0425  NA 137 138 139  K 3.7 3.2* 3.7  CL 103 107 110  CO2 23 23 24   GLUCOSE 118* 144* 107*  BUN 15 13 12   CREATININE 0.67 0.74 0.67  CALCIUM 9.3 8.3* 8.2*   Liver Function Tests:  Recent Labs  Lab 07/24/13 1645 07/25/13 0325  AST 35 96*  ALT 22 84*  ALKPHOS 52 38*  BILITOT 0.6 0.6  PROT 6.6 5.3*  ALBUMIN 3.5 2.8*   No results found for this basename: LIPASE, AMYLASE,  in the last 168 hours No results found for this basename: AMMONIA,  in the last 168 hours CBC:  Recent Labs Lab 07/24/13 1645 07/25/13 0325 07/26/13 0425  WBC 3.7* 20.3* 13.9*  NEUTROABS 3.5  --   --   HGB 14.1 12.1* 11.8*  HCT 41.1 34.6* 34.3*  MCV  94.9 95.1 95.0  PLT 147* 137* 110*   Cardiac Enzymes: No results found for this basename: CKTOTAL, CKMB, CKMBINDEX, TROPONINI,  in the last 168 hours BNP (last 3 results) No results found for this basename: PROBNP,  in the last 8760 hours CBG:  Recent Labs Lab 07/24/13 1638  GLUCAP 119*    Recent Results (from the past 240 hour(s))  CULTURE, BLOOD (ROUTINE X 2)     Status: None   Collection Time    07/24/13  4:35 PM      Result Value Range Status   Specimen Description BLOOD LEFT ARM   Final   Special Requests BOTTLES DRAWN AEROBIC AND ANAEROBIC   Final   Culture  Setup Time     Final   Value: 07/24/2013 18:53     Performed at [facility]   Culture     Final   Value: GRAM NEGATIVE RODS     Note: Gram Stain Report Called to,Read Back By and Verified With: RONNIE STERN AT 06:26 A.M. ON 07/25/2013 WARRB     Performed at [facility]   Report Status PENDING   Incomplete  CULTURE, BLOOD (ROUTINE X 2)     Status: None   Collection Time    07/24/13  4:46 PM      Result Value Range Status   Specimen Description BLOOD RIGHT ARM   Final   Special Requests BOTTLES DRAWN AEROBIC ONLY   Final   Culture  Setup Time     Final   Value: 07/24/2013 18:54     Performed at [facility]   Culture     Final   Value: GRAM NEGATIVE RODS     Note: Gram Stain Report Called to,Read Back By and Verified With: SEAN G@0912  ON [id number] BY [facility]     Performed at [facility]   Report Status PENDING   Incomplete  MRSA PCR SCREENING     Status: None   Collection Time    07/24/13  8:10 PM      Result Value Range Status   MRSA by PCR NEGATIVE  NEGATIVE Final   Comment:            The GeneXpert MRSA Assay (FDA     approved for NASAL specimens     only), is one component of a     comprehensive MRSA colonization     surveillance program. It is not     intended to diagnose MRSA     infection nor to guide or     monitor treatment for     MRSA infections.      Studies: Dg Chest 2 View  07/24/2013   *RADIOLOGY REPORT*  Clinical Data: Shortness of breath.  CHEST - 2 VIEW  Comparison: 07/30/2012  Findings: The cardiomediastinal silhouette is unremarkable. This is a mildly low volume film. Posterior lower lobe opacity on the lateral view  likely represents atelectasis but airspace disease/pneumonia is not excluded. There is no evidence of pleural effusion, pneumothorax or pulmonary mass.  IMPRESSION: Posterior lower lobe opacity in the lateral view - favor atelectasis over airspace disease/pneumonia.   Original Report Authenticated By: Harmon Pier, M.D.   Ct Head W Contrast  07/24/2013   CLINICAL DATA:  79 year old male altered mental status. CT of the abdomen and pelvis with contrast performed 20 min earlier.  EXAM: CT HEAD WITHOUT CONTRAST  TECHNIQUE: Contiguous axial images were obtained from the base of the skull through the vertex without intravenous contrast.  COMPARISON:  07/30/2012 and earlier.  FINDINGS: Visualized paranasal sinuses and mastoids are clear. No acute osseous abnormality identified. Stable orbit and scalp soft tissues.  Intravascular contrast is present from the exam 20 min earlier. Mild Calcified atherosclerosis at the skull base. Incidental choroid plexus cysts. No midline shift, ventriculomegaly, mass effect, evidence of mass lesion, intracranial hemorrhage or evidence of cortically based acute infarction. Minimal to mild for age subcortical white matter hypodensity. Otherwise gray -white matter differentiation is within normal limits throughout the brain. Stable cerebral volume. No abnormal enhancement identified.  IMPRESSION: Stable and negative for age CT appearance of the brain ; intravascular contrast is present from a contrast-enhanced CT Abdomen and Pelvis performed 20 minutes prior to this exam.   Electronically Signed   By: Augusto Gamble   On: 07/24/2013 19:06   Ct Abdomen Pelvis W Contrast  07/24/2013   CLINICAL DATA:  Vomiting, right  lower quadrant hernia that has enlarged.  EXAM: CT ABDOMEN AND PELVIS WITH CONTRAST  TECHNIQUE: Multidetector CT imaging of the abdomen and pelvis was performed using the standard protocol following bolus administration of intravenous contrast.  CONTRAST:  50mL OMNIPAQUE IOHEXOL 300 MG/ML SOLN, OMNIPAQUE IOHEXOL 300 MG/ML SOLN  COMPARISON:  06/01/2011  FINDINGS: Dependent atelectasis in the lung bases. No effusions. Heart is normal size.  There are 2 midline ventral hernias noted, both containing fat only. Prior cholecystectomy. Liver, spleen, pancreas, adrenals and kidneys are unremarkable. No hydronephrosis.  Extensive and diffuse colonic diverticulosis. No active diverticulitis. Small bowel is decompressed.  Foley catheter is present in the bladder which is decompressed. No free fluid, free air or adenopathy. Prostate calcifications and prominence.  No acute bony abnormality.  IMPRESSION: Two midline ventral hernias containing fat.  Diffuse colonic diverticulosis.   Electronically Signed   By: Charlett Nose   On: 07/24/2013 18:47    Scheduled Meds: . aspirin EC  162 mg Oral q morning - 10a  . ceFEPime (MAXIPIME) IV  2 g Intravenous Q8H  . cholecalciferol  1,000 Units Oral Daily  . docusate sodium  100 mg Oral BID  . enoxaparin (LOVENOX) injection  40 mg Subcutaneous Q24H  . multivitamin with minerals  1 tablet Oral q morning - 10a  . sodium chloride  3 mL Intravenous Q12H  . tamsulosin  0.8 mg Oral q morning - 10a  . timolol  1 drop Left Eye Daily   Continuous Infusions:    Principal Problem:   Gram-negative bacteremia Active Problems:   Rosacea   Glaucoma   Hypertension   Febrile illness, acute   Severe sepsis   CAP (community acquired pneumonia)   BPH (benign prostatic hyperplasia)    Time spent: 35 minutes.    [facility]  [facility] Pager [phone]  If 7PM-7AM, please contact night-coverage at [email or web address], password [facility] 07/26/2013, 1:29 PM  LOS: 2 days

## 2013-07-27 DIAGNOSIS — A498 Other bacterial infections of unspecified site: Secondary | ICD-10-CM

## 2013-07-27 DIAGNOSIS — I10 Essential (primary) hypertension: Secondary | ICD-10-CM

## 2013-07-27 LAB — CULTURE, BLOOD (ROUTINE X 2)

## 2013-07-27 LAB — CBC
HCT: 36.9 % — ABNORMAL LOW (ref 39.0–52.0)
MCHC: 33.9 g/dL (ref 30.0–36.0)
Platelets: 146 10*3/uL — ABNORMAL LOW (ref 150–400)
RDW: 13.5 % (ref 11.5–15.5)

## 2013-07-27 LAB — COMPREHENSIVE METABOLIC PANEL
AST: 31 U/L (ref 0–37)
Albumin: 3.1 g/dL — ABNORMAL LOW (ref 3.5–5.2)
Alkaline Phosphatase: 55 U/L (ref 39–117)
BUN: 9 mg/dL (ref 6–23)
Potassium: 3.6 mEq/L (ref 3.5–5.1)
Total Protein: 6.3 g/dL (ref 6.0–8.3)

## 2013-07-27 MED ORDER — SULFAMETHOXAZOLE-TMP DS 800-160 MG PO TABS: 1.0000 | ORAL_TABLET | Freq: Two times a day (BID) | ORAL | Status: DC

## 2013-07-27 NOTE — Progress Notes (Signed)
Patient ID: Jack Noble, male   DOB: 02-May-1929, 79 y.o.   MRN: [id number]         [facility]    Date of Admission:  07/24/2013    Total days of antibiotics 4        Day 3 cefepime          Principal Problem:   Gram-negative bacteremia Active Problems:   Severe sepsis   Rosacea   Glaucoma   Hypertension   BPH (benign prostatic hyperplasia)   Leukocytosis, unspecified   Anemia   Thrombocytopenia, unspecified   . aspirin EC  162 mg Oral q morning - 10a  . ceFEPime (MAXIPIME) IV  2 g Intravenous Q8H  . cholecalciferol  1,000 Units Oral Daily  . docusate sodium  100 mg Oral BID  . enoxaparin (LOVENOX) injection  40 mg Subcutaneous Q24H  . multivitamin with minerals  1 tablet Oral q morning - 10a  . sodium chloride  3 mL Intravenous Q12H  . tamsulosin  0.8 mg Oral q morning - 10a  . timolol  1 drop Left Eye Daily    Subjective: He is feeling "100% better".  Review of Systems: Pertinent items are noted in HPI.  Past Medical History  Diagnosis Date  . Dermatophytosis of nail   . Hyperlipidemia   . Hearing loss   . Hyperplasia of prostate without urinary obstruction   . Cataracts, bilateral   . Rosacea     Dr. Jason Nest  . Allergy   . Glaucoma     Dr. Lottie Dawson  . Corneal abrasion     Dr. Janean Sark, [facility] Ophth  . Sepsis due to Escherichia coli (E. coli) 05/2011    Morganella also cultured  . Diverticulosis   . TIA (transient ischemic attack) 9/6-05/2012    BP 221/99  . Epistaxis     History  Substance Use Topics  . Smoking status: Never Smoker   . Smokeless tobacco: Not on file  . Alcohol Use: No    Family History  Problem Relation Age of Onset  . Stroke Mother 81  . Parkinsonism Brother     Brother died at age at age 60  . Pulmonary embolism Father 12    Post Op  . Diabetes Neg Hx   . Cancer Neg Hx   . COPD Neg Hx   . Heart disease Neg Hx     Allergies  Allergen Reactions  . Codeine     rash  . Minocycline Hcl    REACTION: nervous/chest pressure (Note: may have affected GE valve)  . Astelin [Azelastine Hcl]     Makes me jittery  . Ether     pneumonia  . Zocor [Simvastatin] Other (See Comments)    Rash, muscle problems and it affected his stomach  . Metoprolol     lightheaded    Objective: Temp:  [97.7 F (36.5 C)-98.8 F (37.1 C)] 97.7 F (36.5 C) (09/04 1343) Pulse Rate:  [69-80] 73 (09/04 1343) Resp:  [16-18] 18 (09/04 1343) BP: (136-155)/(60-73) 136/60 mmHg (09/04 1343) SpO2:  [96 %-98 %] 96 % (09/04 1343)  General: He is alert smiling, talking with his wife Skin: No acute rash Lungs: Clear Cor: Regular S1 and S2 no murmurs Abdomen: Soft and nontender No suprapubic or CVA tenderness  Lab Results Lab Results  Component Value Date   WBC 9.3 07/27/2013   HGB 12.5* 07/27/2013   HCT 36.9* 07/27/2013   MCV 94.6 07/27/2013  PLT 146* 07/27/2013    Lab Results  Component Value Date   CREATININE 0.58 07/27/2013   BUN 9 07/27/2013   NA 138 07/27/2013   K 3.6 07/27/2013   CL 105 07/27/2013   CO2 24 07/27/2013    Lab Results  Component Value Date   ALT 45 07/27/2013   AST 31 07/27/2013   ALKPHOS 55 07/27/2013   BILITOT 0.4 07/27/2013      Microbiology: Recent Results (from the past 240 hour(s))  CULTURE, BLOOD (ROUTINE X 2)     Status: None   Collection Time    07/24/13  4:35 PM      Result Value Range Status   Specimen Description BLOOD LEFT ARM   Final   Special Requests BOTTLES DRAWN AEROBIC AND ANAEROBIC   Final   Culture  Setup Time     Final   Value: 07/24/2013 18:53     Performed at [facility]   Culture     Final   Value: ESCHERICHIA COLI     Note: Gram Stain Report Called to,Read Back By and Verified With: RONNIE STERN AT 06:26 A.M. ON 07/25/2013 WARRB     Performed at [facility]   Report Status 07/27/2013 FINAL   Final   Organism ID, Bacteria ESCHERICHIA COLI   Final  CULTURE, BLOOD (ROUTINE X 2)     Status: None   Collection Time    07/24/13  4:46 PM       Result Value Range Status   Specimen Description BLOOD RIGHT ARM   Final   Special Requests BOTTLES DRAWN AEROBIC ONLY   Final   Culture  Setup Time     Final   Value: 07/24/2013 18:54     Performed at [facility]   Culture     Final   Value: ESCHERICHIA COLI     Note: SUSCEPTIBILITIES PERFORMED ON PREVIOUS CULTURE WITHIN THE LAST 5 DAYS.     Note: Gram Stain Report Called to,Read Back By and Verified With: SEAN G@0912  ON [id number] BY [facility]     Performed at [facility]   Report Status 07/27/2013 FINAL   Final  MRSA PCR SCREENING     Status: None   Collection Time    07/24/13  8:10 PM      Result Value Range Status   MRSA by PCR NEGATIVE  NEGATIVE Final   Comment:            The GeneXpert MRSA Assay (FDA     approved for NASAL specimens     only), is one component of a     comprehensive MRSA colonization     surveillance program. It is not     intended to diagnose MRSA     infection nor to guide or     monitor treatment for     MRSA infections.  CULTURE, BLOOD (ROUTINE X 2)     Status: None   Collection Time    07/26/13  1:45 PM      Result Value Range Status   Specimen Description BLOOD RIGHT HAND   Final   Special Requests BOTTLES DRAWN AEROBIC AND [facility]NAEROBIC [facility]   Final   Culture  Setup Time     Final   Value: 07/26/2013 18:24     Performed at [facility]   Culture     Final   Value:        BLOOD CULTURE RECEIVED NO GROWTH  TO DATE CULTURE WILL BE HELD FOR 5 DAYS BEFORE ISSUING A FINAL NEGATIVE REPORT     Performed at [facility]   Report Status PENDING   Incomplete  CULTURE, BLOOD (ROUTINE X 2)     Status: None   Collection Time    07/26/13  2:10 PM      Result Value Range Status   Specimen Description BLOOD RIGHT ARM   Final   Special Requests BOTTLES DRAWN AEROBIC AND ANAEROBIC 10CC   Final   Culture  Setup Time     Final   Value: 07/26/2013 18:25     Performed at [facility]   Culture     Final   Value:        BLOOD  CULTURE RECEIVED NO GROWTH TO DATE CULTURE WILL BE HELD FOR 5 DAYS BEFORE ISSUING A FINAL NEGATIVE REPORT     Performed at [facility]   Report Status PENDING   Incomplete    Studies/Results: No results found.  Assessment: He had Escherichia coli bacteremia of uncertain source. However his urine culture and urinalysis were normal he may have had a early symptomatic UTI given his recent urinary frequency. He responded well to empiric antibiotic therapy and his organism is sensitive to all antibiotics tested. I will complete therapy with oral trimethoprim sulfamethoxazole.  Plan: 1. Discharge home on trimethoprim sulfamethoxazole one double strength tablet by mouth twice daily for 10 more days 2. Please call if we can be of further assistance  Cliffton Asters, MD [facility] [phone] pager   [phone] cell 07/27/2013, 3:09 PM

## 2013-07-27 NOTE — Telephone Encounter (Signed)
LM @ (1:53pm) asking the pt to RTC regarding message below.//AB/CMA

## 2013-07-27 NOTE — Discharge Summary (Signed)
Physician Discharge Summary  Jack Noble BJY:[id number] DOB: 1928-12-13 DOA: 07/24/2013  PCP: [name], MD  Admit date: 07/24/2013 Discharge date: 07/27/2013  Time spent: Greater than 30 minutes  Recommendations for Outpatient Follow-up:  1. Dr. [name], PCP on 08/04/2013 with repeat labs (CBC). 2. Followup final surveillance blood cultures x2 that were drawn on 07/26/13.  Discharge Diagnoses:  Principal Problem:   Gram-negative bacteremia Active Problems:   Rosacea   Glaucoma   Hypertension   Severe sepsis   BPH (benign prostatic hyperplasia)   Leukocytosis, unspecified   Anemia   Thrombocytopenia, unspecified   Discharge Condition: Improved & Stable  Diet recommendation: Heart Healthy diet.  Filed Weights   07/24/13 2000 07/25/13 0400  Weight: 74.2 kg (163 lb 9.3 oz) 76.2 kg (167 lb 15.9 oz)    History of present illness:  79 y.o. year-old male with history of HTN, HLD, BPH with previous history of E. Coli sepsis, ventral hernia, and possible mild dementia who presented with sudden onset of fever, rigors, and chills. He states that other than increasing frequency of urination without dysuria, he has felt well recently. In the ER, his temperature was initially 104.52F, HR in the 110s to 120s, BP 118/60, O2 sat 93% on RA. Labs were notable for normal CMP, WBC of 3.7 (low with 93% neutrophils), hgb 14.1, plt 147. UA was negative. There was a suggestion of atelectasis vs. PNA on the lateral view on his CXR. Head CT and abdominal CT demonstrated no source of fever. He was given a dose of ceftriaxone empirically in the ER for "fever."  Hospital Course:   Escherichia coli Bacteremia/severe sepsis, present on admission  -Source unclear. 2 x 2 blood cultures positive on admission.  -Was admitted about 1 year ago with the same issue and source was thought to be prostatitis. Completed 6 weeks of antibiotics/PICC line.  - Patient was treated empirically with IV cefepime of  which he as completed 3 days treatment - Urine culture is negative. Foley catheter was placed and removed.  - Repeat surveillance blood cultures 9/3 -negative to date - Infectious disease consulted and Dr. Orvan Falconer suspects that although his urine analysis was normal, he may have still had a UTI as source of bacteremia. Dr. Orvan Falconer has seen the patient again today and cleared him for discharge home on additional 11 days of oral Bactrim which will complete total 2 weeks of antibiotics. Patient has done well. He is afebrile, not toxic or septic looking and leukocytosis has resolved. - Transient abnormal LFTs of unclear etiology-improved/resolved.   Leukocytosis  -From above.  - decreasing.   Anemia & Thrombocytopenia  - ? Related to problem #1.  -  hemoglobin is stable and platelet counts have almost normalized.  - Followup repeat CBC during outpatient visit on 9/12.   HTN  -Controlled at present.   Hyperlipidemia  -Not on medications.   BPH  -Continue flomax.   Acute Encephalopathy  - mental status back to baseline. Patient had some transient confusion this morning without agitation which may be related to his underlying dementia.    Large ventral hernia - Uncomplicated. Probably not a candidate for surgical intervention given advanced age and frail overall status.  Colonic diverticulosis - Incidentally found on CT abdomen.  Consultations:  Infectious disease-Dr. Cliffton Asters   Procedures:  None    Discharge Exam:  Complaints:  Transient confusion this morning-resolved. Denies any other complaints. No other acute issues per spouse or nursing.   Filed Vitals:  07/26/13 1419 07/26/13 2149 07/27/13 0527 07/27/13 1343  BP: 145/67 146/64 155/73 136/60  Pulse: 78 80 69 73  Temp: 97.7 F (36.5 C) 98.3 F (36.8 C) 98.8 F (37.1 C) 97.7 F (36.5 C)  TempSrc: Oral Oral Oral Oral  Resp: 16 18 16 18   Height:      Weight:      SpO2: 97% 97% 98% 96%    General exam:  Elderly moderately built and nourished male patient who is in no obvious distress. Pleasant.  Respiratory system: Clear. No increased work of breathing. Cardiovascular system: S1 & S2 heard, RRR. No JVD, murmurs, gallops, clicks or pedal edema. Gastrointestinal system: Abdomen is nondistended, soft and nontender. Normal bowel sounds heard. Large uncomplicated right-sided ventral hernia.  Central nervous system: Alert and oriented. No focal neurological deficits. Extremities: Symmetric 5 x 5 power.  Discharge Instructions      Discharge Orders   Future Appointments Provider Department Dept Phone   08/04/2013 3:00 PM Pecola Lawless, MD [facility]  [place] [phone]   Future Orders Complete By Expires   Call MD for:  extreme fatigue  As directed    Call MD for:  persistant dizziness or light-headedness  As directed    Call MD for:  severe uncontrolled pain  As directed    Call MD for:  temperature >100.4  As directed    Diet - low sodium heart healthy  As directed    Increase activity slowly  As directed        Medication List         aspirin EC 81 MG tablet  Take 162 mg by mouth every morning.     ISTALOL 0.5 % (DAILY) Soln  Generic drug:  Timolol Maleate  Place 1 drop into the left eye every morning.     mometasone 0.1 % ointment  Commonly known as:  ELOCON  Apply 1 application topically 2 (two) times daily as needed (Applies to affected area on hands.).     multivitamin with minerals Tabs tablet  Take 1 tablet by mouth every morning.     nystatin-triamcinolone cream  Commonly known as:  MYCOLOG II  Apply 1 application topically daily as needed (Applies to red places in groin area.).     sulfamethoxazole-trimethoprim 800-160 MG per tablet  Commonly known as:  BACTRIM DS  Take 1 tablet by mouth 2 (two) times daily.     tamsulosin 0.4 MG Caps capsule  Commonly known as:  FLOMAX  Take 0.4 mg by mouth every morning.     Vitamin D 1000 UNITS  capsule  Take 1,000 Units by mouth every morning.       Follow-up Information   Follow up with [name], MD On 08/04/2013. (at 3 PM. To be seen with repeat labs (CBC). Please follow up on final blood culture results drawn in the hospital.)    Specialty:  Internal Medicine   Contact information:   [phone] [facility] [address] [place] [address] [phone]        The results of significant diagnostics from this hospitalization (including imaging, microbiology, ancillary and laboratory) are listed below for reference.    Significant Diagnostic Studies: Dg Chest 2 View  07/24/2013   *RADIOLOGY REPORT*  Clinical Data: Shortness of breath.  CHEST - 2 VIEW  Comparison: 07/30/2012  Findings: The cardiomediastinal silhouette is unremarkable. This is a mildly low volume film. Posterior lower lobe opacity on the lateral view likely represents  atelectasis but airspace disease/pneumonia is not excluded. There is no evidence of pleural effusion, pneumothorax or pulmonary mass.  IMPRESSION: Posterior lower lobe opacity in the lateral view - favor atelectasis over airspace disease/pneumonia.   Original Report Authenticated By: Harmon Pier, M.D.   Ct Head W Contrast  07/24/2013   CLINICAL DATA:  79 year old male altered mental status. CT of the abdomen and pelvis with contrast performed 20 min earlier.  EXAM: CT HEAD WITHOUT CONTRAST  TECHNIQUE: Contiguous axial images were obtained from the base of the skull through the vertex without intravenous contrast.  COMPARISON:  07/30/2012 and earlier.  FINDINGS: Visualized paranasal sinuses and mastoids are clear. No acute osseous abnormality identified. Stable orbit and scalp soft tissues.  Intravascular contrast is present from the exam 20 min earlier. Mild Calcified atherosclerosis at the skull base. Incidental choroid plexus cysts. No midline shift, ventriculomegaly, mass effect, evidence of mass lesion, intracranial hemorrhage or evidence of  cortically based acute infarction. Minimal to mild for age subcortical white matter hypodensity. Otherwise gray -white matter differentiation is within normal limits throughout the brain. Stable cerebral volume. No abnormal enhancement identified.  IMPRESSION: Stable and negative for age CT appearance of the brain ; intravascular contrast is present from a contrast-enhanced CT Abdomen and Pelvis performed 20 minutes prior to this exam.   Electronically Signed   By: Augusto Gamble   On: 07/24/2013 19:06   Ct Abdomen Pelvis W Contrast  07/24/2013   CLINICAL DATA:  Vomiting, right lower quadrant hernia that has enlarged.  EXAM: CT ABDOMEN AND PELVIS WITH CONTRAST  TECHNIQUE: Multidetector CT imaging of the abdomen and pelvis was performed using the standard protocol following bolus administration of intravenous contrast.  CONTRAST:  50mL OMNIPAQUE IOHEXOL 300 MG/ML SOLN, OMNIPAQUE IOHEXOL 300 MG/ML SOLN  COMPARISON:  06/01/2011  FINDINGS: Dependent atelectasis in the lung bases. No effusions. Heart is normal size.  There are 2 midline ventral hernias noted, both containing fat only. Prior cholecystectomy. Liver, spleen, pancreas, adrenals and kidneys are unremarkable. No hydronephrosis.  Extensive and diffuse colonic diverticulosis. No active diverticulitis. Small bowel is decompressed.  Foley catheter is present in the bladder which is decompressed. No free fluid, free air or adenopathy. Prostate calcifications and prominence.  No acute bony abnormality.  IMPRESSION: Two midline ventral hernias containing fat.  Diffuse colonic diverticulosis.   Electronically Signed   By: Charlett Nose   On: 07/24/2013 18:47    Microbiology: Recent Results (from the past 240 hour(s))  CULTURE, BLOOD (ROUTINE X 2)     Status: None   Collection Time    07/24/13  4:35 PM      Result Value Range Status   Specimen Description BLOOD LEFT ARM   Final   Special Requests BOTTLES DRAWN AEROBIC AND ANAEROBIC   Final   Culture   Setup Time     Final   Value: 07/24/2013 18:53     Performed at [facility]   Culture     Final   Value: ESCHERICHIA COLI     Note: Gram Stain Report Called to,Read Back By and Verified With: RONNIE STERN AT 06:26 A.M. ON 07/25/2013 WARRB     Performed at [facility]   Report Status 07/27/2013 FINAL   Final   Organism ID, Bacteria ESCHERICHIA COLI   Final  CULTURE, BLOOD (ROUTINE X 2)     Status: None   Collection Time    07/24/13  4:46 PM  Result Value Range Status   Specimen Description BLOOD RIGHT ARM   Final   Special Requests BOTTLES DRAWN AEROBIC ONLY   Final   Culture  Setup Time     Final   Value: 07/24/2013 18:54     Performed at [facility]   Culture     Final   Value: ESCHERICHIA COLI     Note: SUSCEPTIBILITIES PERFORMED ON PREVIOUS CULTURE WITHIN THE LAST 5 DAYS.     Note: Gram Stain Report Called to,Read Back By and Verified With: SEAN G@0912  ON [id number] BY [facility]     Performed at [facility]   Report Status 07/27/2013 FINAL   Final  MRSA PCR SCREENING     Status: None   Collection Time    07/24/13  8:10 PM      Result Value Range Status   MRSA by PCR NEGATIVE  NEGATIVE Final   Comment:            The GeneXpert MRSA Assay (FDA     approved for NASAL specimens     only), is one component of a     comprehensive MRSA colonization     surveillance program. It is not     intended to diagnose MRSA     infection nor to guide or     monitor treatment for     MRSA infections.  CULTURE, BLOOD (ROUTINE X 2)     Status: None   Collection Time    07/26/13  1:45 PM      Result Value Range Status   Specimen Description BLOOD RIGHT HAND   Final   Special Requests BOTTLES DRAWN AEROBIC AND ANAEROBIC 6CC   Final   Culture  Setup Time     Final   Value: 07/26/2013 18:24     Performed at [facility]   Culture     Final   Value:        BLOOD CULTURE RECEIVED NO GROWTH TO DATE CULTURE WILL BE HELD FOR 5 DAYS BEFORE ISSUING A  FINAL NEGATIVE REPORT     Performed at [facility]   Report Status PENDING   Incomplete  CULTURE, BLOOD (ROUTINE X 2)     Status: None   Collection Time    07/26/13  2:10 PM      Result Value Range Status   Specimen Description BLOOD RIGHT ARM   Final   Special Requests BOTTLES DRAWN AEROBIC AND ANAEROBIC 10CC   Final   Culture  Setup Time     Final   Value: 07/26/2013 18:25     Performed at [facility]   Culture     Final   Value:        BLOOD CULTURE RECEIVED NO GROWTH TO DATE CULTURE WILL BE HELD FOR 5 DAYS BEFORE ISSUING A FINAL NEGATIVE REPORT     Performed at [facility]   Report Status PENDING   Incomplete     Labs: Basic Metabolic Panel:  Recent Labs Lab 07/24/13 1645 07/25/13 0325 07/26/13 0425 07/27/13 0445  NA 137 138 139 138  K 3.7 3.2* 3.7 3.6  CL 103 107 110 105  CO2 23 23 24 24   GLUCOSE 118* 144* 107* 114*  BUN 15 13 12 9   CREATININE 0.67 0.74 0.67 0.58  CALCIUM 9.3 8.3* 8.2* 8.9   Liver Function Tests:  Recent Labs Lab 07/24/13 1645 07/25/13 0325 07/27/13 0445  AST 35 96* 31  ALT 22 84* 45  ALKPHOS 52 38* 55  BILITOT 0.6 0.6 0.4  PROT 6.6 5.3* 6.3  ALBUMIN 3.5 2.8* 3.1*   No results found for this basename: LIPASE, AMYLASE,  in the last 168 hours No results found for this basename: AMMONIA,  in the last 168 hours CBC:  Recent Labs Lab 07/24/13 1645 07/25/13 0325 07/26/13 0425 07/27/13 0445  WBC 3.7* 20.3* 13.9* 9.3  NEUTROABS 3.5  --   --   --   HGB 14.1 12.1* 11.8* 12.5*  HCT 41.1 34.6* 34.3* 36.9*  MCV 94.9 95.1 95.0 94.6  PLT 147* 137* 110* 146*   Cardiac Enzymes: No results found for this basename: CKTOTAL, CKMB, CKMBINDEX, TROPONINI,  in the last 168 hours BNP: BNP (last 3 results) No results found for this basename: PROBNP,  in the last 8760 hours CBG:  Recent Labs Lab 07/24/13 1638  GLUCAP 119*    Additional labs: 1. None   Signed:  Drucilla Cumber  [facility] 07/27/2013,  2:41 PM

## 2013-07-28 NOTE — Telephone Encounter (Signed)
Patient returned your call. I relayed message below from Dr. Alwyn Ren. Ms. Heart was very appreciative. They both have upcoming appts on 08/04/13.

## 2013-08-01 LAB — CULTURE, BLOOD (ROUTINE X 2): Culture: NO GROWTH

## 2013-08-04 ENCOUNTER — Encounter: Payer: Self-pay | Admitting: Internal Medicine

## 2013-08-04 ENCOUNTER — Ambulatory Visit (INDEPENDENT_AMBULATORY_CARE_PROVIDER_SITE_OTHER): Payer: MEDICARE | Admitting: Internal Medicine

## 2013-08-04 VITALS — BP 156/75 | HR 74 | Temp 98.2°F | Wt 159.2 lb

## 2013-08-04 DIAGNOSIS — D696 Thrombocytopenia, unspecified: Secondary | ICD-10-CM | POA: Diagnosis not present

## 2013-08-04 DIAGNOSIS — E8809 Other disorders of plasma-protein metabolism, not elsewhere classified: Secondary | ICD-10-CM | POA: Diagnosis not present

## 2013-08-04 DIAGNOSIS — D649 Anemia, unspecified: Secondary | ICD-10-CM | POA: Diagnosis not present

## 2013-08-04 DIAGNOSIS — A419 Sepsis, unspecified organism: Secondary | ICD-10-CM | POA: Diagnosis not present

## 2013-08-04 DIAGNOSIS — N400 Enlarged prostate without lower urinary tract symptoms: Secondary | ICD-10-CM

## 2013-08-04 DIAGNOSIS — R319 Hematuria, unspecified: Secondary | ICD-10-CM

## 2013-08-04 LAB — POCT URINALYSIS DIPSTICK
Bilirubin, UA: NEGATIVE
Glucose, UA: NEGATIVE
Ketones, UA: NEGATIVE
Spec Grav, UA: 1.015
Urobilinogen, UA: 0.2

## 2013-08-04 LAB — CBC WITH DIFFERENTIAL/PLATELET
Basophils Absolute: 0 10*3/uL (ref 0.0–0.1)
Basophils Relative: 0 % (ref 0–1)
HCT: 42.5 % (ref 39.0–52.0)
Hemoglobin: 14.4 g/dL (ref 13.0–17.0)
Lymphocytes Relative: 24 % (ref 12–46)
MCHC: 33.9 g/dL (ref 30.0–36.0)
Monocytes Absolute: 0.7 10*3/uL (ref 0.1–1.0)
Monocytes Relative: 8 % (ref 3–12)
Neutro Abs: 5.8 10*3/uL (ref 1.7–7.7)
Neutrophils Relative %: 67 % (ref 43–77)
RBC: 4.44 MIL/uL (ref 4.22–5.81)
WBC: 8.7 10*3/uL (ref 4.0–10.5)

## 2013-08-04 MED ORDER — FINASTERIDE 5 MG PO TABS: 5.0000 mg | ORAL_TABLET | Freq: Every day | ORAL | Status: DC

## 2013-08-04 NOTE — Addendum Note (Signed)
Addended by: Silvio Pate D on: 08/04/2013 05:07 PM   Modules accepted: Orders

## 2013-08-04 NOTE — Assessment & Plan Note (Signed)
Finasteride will be added to decrease risk of obstruction with increasing risk of recurrent urinary tract infections

## 2013-08-04 NOTE — Progress Notes (Signed)
  Subjective:    Patient ID: Jack Noble, male    DOB: Feb 08, 1929, 79 y.o.   MRN: [id number]  HPI Hospital records 9/1-07/27/13 were reviewed & a summary placed in the problem list. Although urine cultures were negative he had gram-negative bacteremia with  2 positive blood cultures. He also had associated anemia and thrombocytopenia. He had profound hyperthermia with temperature up to 105.  He had mild hypokalemia with potassium 2.8 and mild reduction in his GFR to 87. Albumin was low at 3.1 His hematocrit was as low as 34.3. He also exhibited random hyperglycemia with glucoses as high as 144.    Review of Systems   Since discharge he has not had fever, chills, or sweats. He  He also denies dysuria, pyuria, or hematuria.     Objective:   Physical Exam  General appearance; adequate nourishment w/o distress but fatigued in appearance.  Eyes: No conjunctival inflammation or scleral icterus is present.  Oral exam: Dental hygiene is good; lips and gums are healthy appearing.There is no oropharyngeal erythema or exudate noted.   Heart:  Normal rate and regular rhythm. S1 and S2 normal without gallop, murmur, click, rub or other extra sounds     Lungs:Chest clear to auscultation; no wheezes, rhonchi,rales ,or rubs present.No increased work of breathing.   Abdomen: bowel sounds normal, soft and non-tender without masses or organomegaly. Ventral / incisional hernia noted.  No guarding or rebound   Skin:Warm & dry.  Intact without suspicious lesions or rashes ; no jaundice; minimal  tenting  Lymphatic: No lymphadenopathy is noted about the head, neck, axilla             Assessment & Plan:  See Current Assessment & Plan in Problem List under specific Diagnosis

## 2013-08-04 NOTE — Assessment & Plan Note (Signed)
CBC and differential will be rechecked to assess the platelet count and hematocrit

## 2013-08-04 NOTE — Patient Instructions (Addendum)
Drink to thirst up to 32 oz daily.Avoid spicy foods or alcohol as  these may aggravate the prostate. Do not take decongestants. Avoid narcotics if possible.

## 2013-08-05 LAB — BASIC METABOLIC PANEL
Chloride: 101 mEq/L (ref 96–112)
Glucose, Bld: 93 mg/dL (ref 70–99)
Potassium: 4.2 mEq/L (ref 3.5–5.3)
Sodium: 134 mEq/L — ABNORMAL LOW (ref 135–145)

## 2013-08-06 LAB — URINE CULTURE
Colony Count: NO GROWTH
Organism ID, Bacteria: NO GROWTH

## 2013-08-07 ENCOUNTER — Telehealth: Payer: Self-pay | Admitting: *Deleted

## 2013-08-07 NOTE — Telephone Encounter (Signed)
Message copied by Baldwin [place] on Mon Aug 07, 2013  3:44 PM ------      Message from: Pecola Lawless      Created: Mon Aug 07, 2013 12:50 PM       Good; the urine culture reveals no evidence of an active urinary tract infection. Hopp       ------

## 2013-08-07 NOTE — Telephone Encounter (Signed)
Spoke with pt and caregiver, advised of recent urine culture results that showed no sign of UTI.

## 2013-08-14 ENCOUNTER — Telehealth: Payer: Self-pay | Admitting: Internal Medicine

## 2013-08-14 NOTE — Telephone Encounter (Signed)
Patient's wife is calling in regards to the patient's Finasteride rx. States that they are worried about the side effects he might have when taking them. They also want to know if he is able to start back driving. Please advise.

## 2013-08-17 NOTE — Telephone Encounter (Signed)
Spoke with the pt's wife and she wanted to know when the pt needed to see Dr. Alwyn Ren again, and she stated that the pt seem like he was not feeling good.  Asked her if he was not feeling good and he told her that he felt fine.  She asked if I would like to speak with the pt.  Spoke with the pt and he stated he wanted to know if he could take the Proscar at night.  Informed him it would be okay to take at night.  Also informed the pt if he or his wife feels like he is not doing good or they are worried about something to please make an appt with Dr. Alwyn Ren.  He understood and agreed.  Also informed the pt if he is not having any urinate problems he can start back to driven.  Pt stated no problems. //AB/CMA

## 2013-09-01 ENCOUNTER — Ambulatory Visit (INDEPENDENT_AMBULATORY_CARE_PROVIDER_SITE_OTHER): Payer: MEDICARE | Admitting: Internal Medicine

## 2013-09-01 ENCOUNTER — Encounter: Payer: Self-pay | Admitting: Internal Medicine

## 2013-09-01 VITALS — BP 132/68 | HR 74 | Resp 13 | Wt 163.0 lb

## 2013-09-01 DIAGNOSIS — Z8669 Personal history of other diseases of the nervous system and sense organs: Secondary | ICD-10-CM

## 2013-09-01 DIAGNOSIS — Z23 Encounter for immunization: Secondary | ICD-10-CM

## 2013-09-01 DIAGNOSIS — Z87898 Personal history of other specified conditions: Secondary | ICD-10-CM | POA: Insufficient documentation

## 2013-09-01 DIAGNOSIS — I10 Essential (primary) hypertension: Secondary | ICD-10-CM

## 2013-09-01 DIAGNOSIS — H18459 Nodular corneal degeneration, unspecified eye: Secondary | ICD-10-CM | POA: Diagnosis not present

## 2013-09-01 DIAGNOSIS — Z961 Presence of intraocular lens: Secondary | ICD-10-CM | POA: Diagnosis not present

## 2013-09-01 DIAGNOSIS — N400 Enlarged prostate without lower urinary tract symptoms: Secondary | ICD-10-CM | POA: Diagnosis not present

## 2013-09-01 NOTE — Progress Notes (Signed)
  Subjective:    Patient ID: Jack Noble, male    DOB: 1929-07-25, 79 y.o.   MRN: [id number]  HPI Blood pressure measurements at home range from 132/ 68-172/80.  He continues to have weakness in his legs.  He's concerned about taking finasteride particularly in combo from the Flomax. This was prescribed as he had an episode of sepsis in September presenting as frequency and temp up to 105. The finasteride was prescribed in attempt to reduce the size of his prostate and help prevent recurrent urinary tract infections & risk of urosepsis. He is concerned as sometimes he will lose his train of thought while speaking. He also states that he would like to say something specifically but "can't get it out". He is on 81 mg ASA daily. ASA 325 caused epistaxis. He is able to do Jumble in paper   Soy supplementation was recommended because of low albumin levels      Review of Systems    He denies chest pain, palpitations, persistent lightheadedness,exertional dyspnea, paroxysmal nocturnal dyspnea, edema, or claudication.  He denies any difficulty starting urinary stream or finishing urination. He also has no dysuria, hematuria, or pyuria.     Objective:   Physical Exam Gen.:  well-nourished in appearance. Alert, appropriate and cooperative throughout exam.Appears younger than stated age  Head: Normocephalic without obvious abnormalities; pattern alopecia  Eyes: No corneal or conjunctival inflammation noted. No scleral icterus Neck: No deformities, masses, or tenderness noted. Range of motion decreased . Thyroid normal. Lungs: Normal respiratory effort; chest expands symmetrically. Lungs are clear to auscultation without rales, wheezes, or increased work of breathing. Heart: Normal rate and rhythm. Normal S1 and S2. No gallop, click, or rub. S4 w/o murmur. Abdomen: Bowel sounds normal; abdomen soft and nontender. No masses or organomegaly. Large incisional  hernia noted.          Musculoskeletal/extremities: No clubbing, cyanosis or significant extremity  deformity noted. Range of motion normal .Tone & strength good to opposition.  Joints normal . Nail health good. Trace sockline edema Able to lie down & sit up w/o help. Negative SLR bilaterally Vascular: Carotid, radial artery, dorsalis pedis and  posterior tibial pulses are full and equal. No bruits present. Neurologic: Alert and oriented x3. Spelling testing and recall excellent. Deep tendon reflexes symmetrical and normal.          Skin: Intact without suspicious lesions or rashes. Lymph: No cervical, axillary lymphadenopathy present. Psych: Mood and affect are normal. Normally interactive                                                                                        Assessment & Plan:  #1 hypertension, marked variability.  #2 prostatic hypertrophy with probable PMH of urosepsis. The combination of finasteride and Flomax are preventive.  #3 short term memory deficit, not documented. Unable to take full dose aspirin because of epistaxis.  #4 extremity weakness, not documented  #5 word recall issues  Neurology & Physical Therapy  referrals declined.Focus on BP average, not range

## 2013-09-01 NOTE — Patient Instructions (Signed)
Please do the mental exercises  we discussed daily. Cardiovascular exercise is recommended 30-45 minutes 3-4 times per week.

## 2013-09-04 DIAGNOSIS — L259 Unspecified contact dermatitis, unspecified cause: Secondary | ICD-10-CM | POA: Diagnosis not present

## 2013-09-04 DIAGNOSIS — L570 Actinic keratosis: Secondary | ICD-10-CM | POA: Diagnosis not present

## 2013-10-02 ENCOUNTER — Encounter: Payer: Self-pay | Admitting: Internal Medicine

## 2013-10-02 ENCOUNTER — Ambulatory Visit (INDEPENDENT_AMBULATORY_CARE_PROVIDER_SITE_OTHER): Payer: MEDICARE | Admitting: Internal Medicine

## 2013-10-02 VITALS — BP 158/73 | HR 72 | Temp 97.9°F | Wt 162.4 lb

## 2013-10-02 DIAGNOSIS — R3915 Urgency of urination: Secondary | ICD-10-CM

## 2013-10-02 DIAGNOSIS — N401 Enlarged prostate with lower urinary tract symptoms: Secondary | ICD-10-CM

## 2013-10-02 DIAGNOSIS — R259 Unspecified abnormal involuntary movements: Secondary | ICD-10-CM

## 2013-10-02 DIAGNOSIS — R251 Tremor, unspecified: Secondary | ICD-10-CM

## 2013-10-02 LAB — POCT URINALYSIS DIPSTICK
Bilirubin, UA: NEGATIVE
Ketones, UA: NEGATIVE
Leukocytes, UA: NEGATIVE
Spec Grav, UA: 1.01

## 2013-10-02 NOTE — Patient Instructions (Signed)
I recommend a Urology consultation to determine optimal therapy for nocturia & urgency if persistent.

## 2013-10-02 NOTE — Progress Notes (Signed)
  Subjective:    Patient ID: Jack Noble, male    DOB: June 14, 1929, 79 y.o.   MRN: [id number]  HPI   He believes that his tremor is related to finasteride as he associates the onset of tremor with initiation of this agent. It was prescribed an attempt to prevent progressive prostatic enlargement which may be playing a role in his urinary tract infections and sepsis type features for  which he's been hospitalized.  He had been evaluated by Dr. Margarita Grizzle in October 2013. He recommended annual monitor  of the prostate by digital rectal exam. He did not recommend PSA monitoring.    Review of Systems   He denies dysuria, pyuria, or hematuria. He states he has no trouble starting the stream or finishing his stream. He does have nocturia 2 times per night. He does have urinary urgency.      Objective:   Physical Exam General appearance : good nourishment w/o distress. Appears younger than stated age  Eyes: No conjunctival inflammation or scleral icterus is present.  Oral exam: Dental hygiene is good; lips and gums are healthy appearing.There is no oropharyngeal erythema or exudate noted.   Heart:  Normal rate and regular rhythm. S1 and S2 normal without gallop, murmur, click, rub or other extra sounds     Lungs:Chest clear to auscultation; no wheezes, rhonchi,rales ,or rubs present.No increased work of breathing.   Abdomen: bowel sounds normal, soft and non-tender without masses, organomegaly or hernias noted.  No guarding or rebound . No tenderness over the flanks to percussion  Musculoskeletal: Able to lie flat and sit up without help. Negative straight leg raising bilaterally. Gait normal  Skin:Warm & dry.  Intact without suspicious lesions or rashes ; no jaundice or tenting  Lymphatic: No lymphadenopathy is noted about the head, neck, axilla.                Assessment & Plan:  #1 tremor attributed to finasteride  #2 urgency and nocturia in the context of  BPH  Plan: Discontinue finasteride; check urine. Recommend urology monitor of his genitourinary symptoms as recommended by Dr. Margarita Grizzle.

## 2013-10-02 NOTE — Progress Notes (Signed)
Pre visit review using our clinic review tool, if applicable. No additional management support is needed unless otherwise documented below in the visit note. 

## 2013-10-13 ENCOUNTER — Ambulatory Visit (INDEPENDENT_AMBULATORY_CARE_PROVIDER_SITE_OTHER): Payer: MEDICARE | Admitting: Internal Medicine

## 2013-10-13 ENCOUNTER — Encounter: Payer: Self-pay | Admitting: Internal Medicine

## 2013-10-13 VITALS — BP 162/68 | HR 65 | Temp 97.0°F | Wt 159.0 lb

## 2013-10-13 DIAGNOSIS — F411 Generalized anxiety disorder: Secondary | ICD-10-CM

## 2013-10-13 DIAGNOSIS — N400 Enlarged prostate without lower urinary tract symptoms: Secondary | ICD-10-CM | POA: Diagnosis not present

## 2013-10-13 DIAGNOSIS — R3915 Urgency of urination: Secondary | ICD-10-CM | POA: Diagnosis not present

## 2013-10-13 DIAGNOSIS — R5381 Other malaise: Secondary | ICD-10-CM | POA: Diagnosis not present

## 2013-10-13 DIAGNOSIS — R350 Frequency of micturition: Secondary | ICD-10-CM | POA: Diagnosis not present

## 2013-10-13 DIAGNOSIS — R351 Nocturia: Secondary | ICD-10-CM | POA: Diagnosis not present

## 2013-10-13 MED ORDER — SERTRALINE HCL 50 MG PO TABS: ORAL_TABLET | ORAL | Status: DC

## 2013-10-13 NOTE — Patient Instructions (Addendum)
Consider pathophysiology of neurotransmitter deficiency as we discussed along with the benefits and potential adverse effects of sertraline therapy.

## 2013-10-13 NOTE — Progress Notes (Signed)
Pre visit review using our clinic review tool, if applicable. No additional management support is needed unless otherwise documented below in the visit note. 

## 2013-10-13 NOTE — Progress Notes (Signed)
Subjective:    Patient ID: Jack Noble, male    DOB: 11-11-1929, 79 y.o.   MRN: [id number]  HPI  His wife states that he will have spells where he gets weak, sweaty, and anxious. The symptoms will resolve without treatment.After such episodes he has taken naps.  He is states that he feels "scared" when these occur. His fear relates to having had 2 septic episodes in the context of urinary tract infection.  The current episodes are not associated with chest pain, palpitations, or shortness of breath. He denies a constellation of headache, chest pain, flushing, and diarrhea. He states they seem to be more often before meals rather than several hours after a meal. They're not related to fasting per se. [name] has been using the [employer]; this has not had an impact on his symptoms. Recent extensive labs were performed 08/04/13. His sodium was minimally reduced at 134. Renal function was excellent. CBC and differential was normal. TSH was therapeutic in June.    Review of Systems  His urologist, Dr. Margarita Grizzle called this afternoon. He states that his voiding function is excellent with no urine residual. No bladder tumors seen on scan. Avodart was recommended by him.  He states that since his episodes of urosepsis his has difficulty expressing himself.  At this time he denies fever, chills, sweats, weight loss. He also denies any visual change of blurred vision, double vision or loss of vision.  He also denies hoarseness or difficulty swallowing  He has no constipation, diarrhea, melena, rectal bleeding  He has not had associated joint pain, swelling, or redness. He also denies myalgias  He has noted no changes in his hair, skin, nails.  His wife states there's been no excessive snoring or apnea.  He has no abnormal bruising or bleeding     Objective:   Physical Exam Gen.:  well-nourished in appearance. Alert, appropriate and cooperative throughout exam.Appears younger  than stated age  Head: Normocephalic without obvious abnormalities;pattern alopecia  Eyes: No corneal or conjunctival inflammation noted. Pupils equal round reactive to light and accommodation. Extraocular motion intact.  Nose: External nasal exam reveals no deformity or inflammation. Nasal mucosa are pink and moist. No lesions or exudates noted.   Mouth: Oral mucosa and oropharynx reveal no lesions or exudates. Teeth in good repair. Neck: No deformities, masses, or tenderness noted.  Thyroid normal. Lungs: Normal respiratory effort; chest expands symmetrically. Lungs are clear to auscultation without rales, wheezes, or increased work of breathing. Heart: Normal rate and rhythm. Normal S1 and S2. No gallop, click, or rub. S4 w/o murmur. Abdomen: Bowel sounds normal; abdomen soft and nontender. No masses or organomegaly.Ventral hernia noted.                                 Musculoskeletal/extremities:  No clubbing, cyanosis, edema, or significant extremity  deformity noted. Tone & strength normal. Fingernail  health good. Able to lie down & sit up w/o help. Negative SLR bilaterally Vascular: Carotid, radial artery, dorsalis pedis and  posterior tibial pulses are full and equal. No bruits present. Neurologic: Alert and oriented x3. Deep tendon reflexes symmetrical and normal.       Skin: Intact without suspicious lesions or rashes. Lymph: No cervical, axillary lymphadenopathy present. Psych: Mood and affect are normal. Normally interactive  Assessment & Plan:  #1 fatigue #2 possible panic attacks; doubt pheochromocytoma Plan:trial of Sertraline 25 mg Fasting labs

## 2013-10-18 DIAGNOSIS — H409 Unspecified glaucoma: Secondary | ICD-10-CM | POA: Diagnosis not present

## 2013-10-18 DIAGNOSIS — H4050X0 Glaucoma secondary to other eye disorders, unspecified eye, stage unspecified: Secondary | ICD-10-CM | POA: Diagnosis not present

## 2013-10-30 ENCOUNTER — Encounter: Payer: Self-pay | Admitting: Internal Medicine

## 2013-10-30 ENCOUNTER — Ambulatory Visit (INDEPENDENT_AMBULATORY_CARE_PROVIDER_SITE_OTHER): Payer: MEDICARE | Admitting: Internal Medicine

## 2013-10-30 VITALS — BP 138/78 | HR 71 | Temp 98.1°F | Wt 159.4 lb

## 2013-10-30 DIAGNOSIS — J209 Acute bronchitis, unspecified: Secondary | ICD-10-CM | POA: Diagnosis not present

## 2013-10-30 MED ORDER — AZITHROMYCIN 250 MG PO TABS: ORAL_TABLET | ORAL | Status: DC

## 2013-10-30 NOTE — Progress Notes (Signed)
   Subjective:    Patient ID: Jack Noble, male    DOB: 09-30-1929, 79 y.o.   MRN: [id number]  HPI  Symptoms began 2 weeks ago his throat congestion and cough productive of yellow plugs. He will produce some thumb sized plugs up to 3 times a day. The cough has disturbed sleep. He has been using a home remedy consisting of lemon ,whiskey and honey with benefit. Additionally he's been taking Tylenol as needed    Review of Systems  He denies extrinsic symptoms of itchy, watery eyes, sneezing. She's had no fever, chills, or sweats  There is no associated wheezing or shortness of breath with the cough  He also denies otic pain or discharge but has had some discomfort in the lateral neck below the ears. This morning he had epistaxis x2 requiring ice packs. He has been seen by the otolaryngologist for this. The ENT specialist is attempting to avoid cauterization if at all possible     Objective:   Physical Exam  General appearance:good health ;well nourished; no acute distress or increased work of breathing is present.  No  lymphadenopathy about the head, neck, or axilla noted.   Eyes: No conjunctival inflammation or lid edema is present.   Ears:  External ear exam shows no significant lesions or deformities.  Otoscopic examination reveals clear canals, tympanic membranes are intact bilaterally without bulging, retraction, inflammation or discharge.  Nose:  External nasal examination shows no deformity or inflammation. Nasal mucosa are pink and moist without lesions or exudates. No septal dislocation or deviation.No obstruction to airflow.   Oral exam: Dental hygiene is good; lips and gums are healthy appearing.There is no oropharyngeal erythema or exudate noted.   Neck:  No deformities, masses, or tenderness noted.     Heart:  Normal rate and regular rhythm. S1 and S2 normal without gallop, murmur, click, rub or other extra sounds.   Lungs:Chest clear to auscultation; no wheezes,  rhonchi,rales ,or rubs present.No increased work of breathing.    Extremities:  No cyanosis or clubbing  noted . Trace edema   Skin: Warm & dry .         Assessment & Plan:  #1 acute bronchitis w/o bronchospasm  Plan: See orders and recommendations

## 2013-10-30 NOTE — Patient Instructions (Signed)
Your next office appointment will be determined based upon response to meds. Please report any significant change in your symptoms. 

## 2013-11-09 DIAGNOSIS — R040 Epistaxis: Secondary | ICD-10-CM | POA: Diagnosis not present

## 2013-11-24 DIAGNOSIS — N400 Enlarged prostate without lower urinary tract symptoms: Secondary | ICD-10-CM | POA: Diagnosis not present

## 2013-11-24 DIAGNOSIS — R3915 Urgency of urination: Secondary | ICD-10-CM | POA: Diagnosis not present

## 2013-11-24 DIAGNOSIS — R351 Nocturia: Secondary | ICD-10-CM | POA: Diagnosis not present

## 2013-11-24 DIAGNOSIS — R350 Frequency of micturition: Secondary | ICD-10-CM | POA: Diagnosis not present

## 2013-12-08 ENCOUNTER — Other Ambulatory Visit (INDEPENDENT_AMBULATORY_CARE_PROVIDER_SITE_OTHER): Payer: MEDICARE

## 2013-12-08 DIAGNOSIS — R5383 Other fatigue: Principal | ICD-10-CM

## 2013-12-08 DIAGNOSIS — R5381 Other malaise: Secondary | ICD-10-CM | POA: Diagnosis not present

## 2013-12-08 LAB — BASIC METABOLIC PANEL
BUN: 12 mg/dL (ref 6–23)
CALCIUM: 8.9 mg/dL (ref 8.4–10.5)
CO2: 30 mEq/L (ref 19–32)
Chloride: 106 mEq/L (ref 96–112)
Creatinine, Ser: 0.6 mg/dL (ref 0.4–1.5)
GFR: 129.3 mL/min (ref >60.00)
GLUCOSE: 102 mg/dL — AB (ref 70–99)
POTASSIUM: 4.2 meq/L (ref 3.5–5.1)
SODIUM: 139 meq/L (ref 135–145)

## 2013-12-08 LAB — CBC WITH DIFFERENTIAL/PLATELET
Basophils Absolute: 0 10*3/uL (ref 0.0–0.1)
Basophils Relative: 0.6 % (ref 0.0–3.0)
Eosinophils Absolute: 0.1 10*3/uL (ref 0.0–0.7)
Eosinophils Relative: 1.3 % (ref 0.0–5.0)
HCT: 38.9 % — ABNORMAL LOW (ref 39.0–52.0)
Hemoglobin: 13.1 g/dL (ref 13.0–17.0)
Lymphocytes Relative: 28.3 % (ref 12.0–46.0)
Lymphs Abs: 1.6 10*3/uL (ref 0.7–4.0)
MCHC: 33.7 g/dL (ref 30.0–36.0)
MCV: 95.8 fl (ref 78.0–100.0)
Monocytes Absolute: 0.4 10*3/uL (ref 0.1–1.0)
Monocytes Relative: 6.7 % (ref 3.0–12.0)
Neutro Abs: 3.7 10*3/uL (ref 1.4–7.7)
Neutrophils Relative %: 63.1 % (ref 43.0–77.0)
Platelets: 197 10*3/uL (ref 150.0–400.0)
RBC: 4.07 Mil/uL — ABNORMAL LOW (ref 4.22–5.81)
RDW: 13.4 % (ref 11.5–14.6)
WBC: 5.8 10*3/uL (ref 4.5–10.5)

## 2013-12-08 LAB — TSH: TSH: 1.47 u[IU]/mL (ref 0.35–5.50)

## 2013-12-08 LAB — ALT: ALT: 18 U/L (ref 0–53)

## 2013-12-08 LAB — AST: AST: 19 U/L (ref 0–37)

## 2013-12-11 ENCOUNTER — Ambulatory Visit: Payer: MEDICARE

## 2013-12-11 DIAGNOSIS — R7309 Other abnormal glucose: Secondary | ICD-10-CM

## 2013-12-11 LAB — HEMOGLOBIN A1C: HEMOGLOBIN A1C: 5.5 % (ref 4.6–6.5)

## 2013-12-12 ENCOUNTER — Telehealth: Payer: Self-pay | Admitting: *Deleted

## 2013-12-12 NOTE — Telephone Encounter (Signed)
Spoke with patient and made aware of A1c results. Encouraged to continue with healthy lifestyle he is currently doing.

## 2013-12-12 NOTE — Telephone Encounter (Signed)
Message copied by Chilton Greathouse on Tue Dec 12, 2013  9:10 AM ------      Message from: Hendricks Limes      Created: Mon Dec 11, 2013  5:46 PM       No Diabetes risk present if A1c is < 6.1%               ------

## 2013-12-15 ENCOUNTER — Encounter: Payer: Self-pay | Admitting: Internal Medicine

## 2013-12-15 ENCOUNTER — Ambulatory Visit (INDEPENDENT_AMBULATORY_CARE_PROVIDER_SITE_OTHER): Payer: MEDICARE | Admitting: Internal Medicine

## 2013-12-15 VITALS — BP 142/72 | HR 63 | Temp 97.8°F | Wt 159.8 lb

## 2013-12-15 DIAGNOSIS — R4789 Other speech disturbances: Secondary | ICD-10-CM

## 2013-12-15 DIAGNOSIS — F809 Developmental disorder of speech and language, unspecified: Secondary | ICD-10-CM | POA: Diagnosis not present

## 2013-12-15 DIAGNOSIS — R413 Other amnesia: Secondary | ICD-10-CM | POA: Diagnosis not present

## 2013-12-15 NOTE — Progress Notes (Signed)
Pre visit review using our clinic review tool, if applicable. No additional management support is needed unless otherwise documented below in the visit note. 

## 2013-12-15 NOTE — Progress Notes (Signed)
   Subjective:    Patient ID: Jack Noble, male    DOB: 02-22-1929, 80 y.o.   MRN: [id number]  HPI  Presents with difficulty word retrieval since hospitalization in September 2014. Reports improvement, although still troublesome when stressed or anxious.   Review of Systems  Denies loss of sensorium, gross memory or motor function, or dysphagia.  Denies urinary or stool incontinence. No constitutional symptoms present Epistaxis treated by Dr Lucia Gaskins, ENT           Objective:   Physical Exam  Gen.:  well-nourished in appearance. Alert, appropriate and cooperative throughout exam. Appears younger than stated age  Head: Normocephalic without obvious abnormalities; pattern alopecia  Eyes: No corneal or conjunctival inflammation noted. Pupils equal round reactive to light and accommodation. Extraocular motion intact. FOV normal Ears: External  ear exam reveals no significant lesions or deformities. Hearing is grossly normal bilaterally. Nose: External nasal exam reveals no deformity or inflammation. Nasal mucosa are pink and moist. No lesions or exudates noted.   Mouth: Oral mucosa and oropharynx reveal no lesions or exudates. Teeth in good repair. Neck: No deformities, masses, or tenderness noted.  Lungs: Normal respiratory effort; chest expands symmetrically. Lungs are clear to auscultation without rales, wheezes, or increased work of breathing. Heart: Normal rate and rhythm. Normal S1 and S2. No gallop, click, or rub.No murmur.                                  Musculoskeletal/extremities:  No clubbing, cyanosis, edema, or significant extremity  deformity noted. Tone not increased & strength normal.  Vascular: Carotid, radial artery pulses are full and equal. No bruits present. Neurologic: Alert and oriented x3. Deep tendon reflexes symmetrical and normal. Decreased facial expression w/o definite masked facies.Cranial nerve exam grossly normal. Gait shuffling & slightly broad; arm  swing decreased. Slow turning .No retropulsion.Some stuttering when stressed to retrieve word.No tremor         Skin: Intact without suspicious lesions or rashes. Lymph: No cervical, axillary lymphadenopathy present. Psych: Mood and affect are normal. Normally interactive     MMSE : 27 out of 30 (poor recall of 3 items); named 10 animals in 40 seconds; clock drawing test good.                                                                                    Assessment & Plan:  #1 minor memory deficit #2 difficulty with word retrieval See orders

## 2013-12-15 NOTE — Patient Instructions (Signed)
I recommend a Neurology  Consultation with Dr Floyde Parkins to assess memory & word recall issues

## 2013-12-25 ENCOUNTER — Other Ambulatory Visit: Payer: Self-pay | Admitting: Internal Medicine

## 2013-12-25 NOTE — Telephone Encounter (Signed)
Tamsulosin refilled per protocol. JG//CMA 

## 2013-12-26 ENCOUNTER — Ambulatory Visit (INDEPENDENT_AMBULATORY_CARE_PROVIDER_SITE_OTHER): Payer: MEDICARE | Admitting: Neurology

## 2013-12-26 ENCOUNTER — Encounter: Payer: Self-pay | Admitting: Neurology

## 2013-12-26 VITALS — BP 177/79 | HR 74 | Ht 65.5 in | Wt 159.0 lb

## 2013-12-26 DIAGNOSIS — R4701 Aphasia: Secondary | ICD-10-CM | POA: Diagnosis not present

## 2013-12-26 DIAGNOSIS — D518 Other vitamin B12 deficiency anemias: Secondary | ICD-10-CM

## 2013-12-26 HISTORY — DX: Aphasia: R47.01

## 2013-12-26 NOTE — Progress Notes (Signed)
Reason for visit: Aphasia  Jack Noble is a 80 y.o. male  History of present illness:  Jack Noble is an 80 year old left-handed white male with a history of a febrile illness associated with a urinary tract infection in September 2014. The patient into the hospital with a very high fever of around 105. The patient had some altered mental status during that hospitalization, and CT scan of the brain was done and was unremarkable. The patient however, has had a persistent issue with aphasia since that time. The patient seemed to improve initially, but the speech issue has plateaued in severity, and has not completely resolved. The patient has not had any other symptoms of numbness or weakness of the extremities or face. The patient denies any swallowing problems. The patient has not had any problems with balance or problems controlling the bowels or the bladder. The patient denies headache or visual field changes. The patient has a mild memory issue as well. The patient is sent to this office for an evaluation. The patient denies issues with understanding what is said to him, he mainly has issues with word finding and generating speech.  Past Medical History  Diagnosis Date  . Dermatophytosis of nail   . Hyperlipidemia   . Hearing loss   . Hyperplasia of prostate without urinary obstruction   . Cataracts, bilateral   . Rosacea     Dr. Marge Duncans  . Allergy   . Glaucoma     Dr. Edilia Bo  . Corneal abrasion     Dr. Ned Clines, [facility]  . Sepsis due to Escherichia coli (E. coli) 05/2011    Morganella also cultured  . Diverticulosis   . TIA (transient ischemic attack) 9/6-05/2012    BP 221/99  . Epistaxis   . Aphasia 12/26/2013    Past Surgical History  Procedure Laterality Date  . Tonsillectomy and adenoidectomy    . Appendectomy      age 78  . Cataract extraction, bilateral  2003    Dr Ishmael Holter (now seen @ [facility])  . Inguinal hernia repair      age 74  . Colonoscopy w/  polypectomy  06/2009    Dr.Jacobs; "miniscule polyp"  . Cholecystectomy  1978    stones    Family History  Problem Relation Age of Onset  . Stroke Mother 60  . Parkinsonism Brother     Brother died at age at age 90  . Pulmonary embolism Father 48    Post Op  . Diabetes Neg Hx   . Cancer Neg Hx   . COPD Neg Hx   . Heart disease Neg Hx     Social history:  reports that he has never smoked. He has never used smokeless tobacco. He reports that he does not drink alcohol or use illicit drugs.  Medications:  Current Outpatient Prescriptions on File Prior to Visit  Medication Sig Dispense Refill  . aspirin EC 81 MG tablet Take 162 mg by mouth every morning.      . Cholecalciferol (VITAMIN D) 1000 UNITS capsule Take 1,000 Units by mouth every morning.       . finasteride (PROSCAR) 5 MG tablet Take 5 mg by mouth daily.      . mometasone (ELOCON) 0.1 % ointment Apply 1 application topically 2 (two) times daily as needed (Applies to affected area on hands.).      Marland Kitchen Multiple Vitamin (MULTIVITAMIN WITH MINERALS) TABS Take 1 tablet by mouth every morning.       Marland Kitchen  nystatin-triamcinolone (MYCOLOG II) cream Apply 1 application topically daily as needed (Applies to red places in groin area.).      Marland Kitchen tamsulosin (FLOMAX) 0.4 MG CAPS capsule Take 0.4 mg by mouth every morning.      . Timolol Maleate (ISTALOL) 0.5 % (DAILY) SOLN Place 1 drop into the left eye 2 (two) times daily at 8 am and 10 pm.        No current facility-administered medications on file prior to visit.      Allergies  Allergen Reactions  . Codeine     Rash Because of a history of documented adverse serious drug reaction;Medi Alert bracelet  is recommended  . Minocycline Hcl     REACTION: nervous/chest pressure (Note: may have affected GE valve)  . Astelin [Azelastine Hcl]     Makes me jittery  . Ether     pneumonia  . Zocor [Simvastatin] Other (See Comments)    Itching w/o, muscle problems and it affected his stomach  .  Metoprolol     lightheaded    ROS:  Out of a complete 14 system review of symptoms, the patient complains only of the following symptoms, and all other reviewed systems are negative.  Skin rash Skin sensitivity  Memory loss, speech problems  Blood pressure 177/79, pulse 74, height 5' 5.5" (1.664 m), weight 159 lb (72.122 kg).  Physical Exam  General: The patient is alert and cooperative at the time of the examination.  Eyes: Pupils are equal, round, and reactive to light. Discs are flat bilaterally.  Neck: The neck is supple, no carotid bruits are noted.  Respiratory: The respiratory examination is clear.  Cardiovascular: The cardiovascular examination reveals a regular rate and rhythm, no obvious murmurs or rubs are noted.  Skin: Extremities are without significant edema.  Neurologic Exam  Mental status: The patient is alert and oriented x 3 at the time of the examination. The patient has apparent normal recent and remote memory, with an apparently normal attention span and concentration ability.  Cranial nerves: Facial symmetry is present. There is good sensation of the face to pinprick and soft touch bilaterally. The strength of the facial muscles and the muscles to head turning and shoulder shrug are normal bilaterally. Speech is well enunciated, no aphasia or dysarthria is noted. Extraocular movements are full. Visual fields are full. The tongue is midline, and the patient has symmetric elevation of the soft palate. No obvious hearing deficits are noted.  Motor: The motor testing reveals 5 over 5 strength of all 4 extremities. Good symmetric motor tone is noted throughout.  Sensory: Sensory testing is intact to pinprick, soft touch, vibration sensation, and position sense on all 4 extremities. No evidence of extinction is noted.  Coordination: Cerebellar testing reveals good finger-nose-finger and heel-to-shin bilaterally.  Gait and station: Gait is normal. Tandem gait is  normal. Romberg is negative. No drift is seen.  Reflexes: Deep tendon reflexes are symmetric and normal bilaterally. Toes are downgoing bilaterally.   Assessment/Plan:  1. Aphasia  2. Mild memory disturbance  The patient appeared to have a relatively sudden change in his speech and language abilities. The patient will need to be evaluated for cerebrovascular disease. The patient however, is very claustrophobic, and he indicates that he cannot have a MRI evaluation. The patient will undergo a repeat CT scan of brain. The patient will undergo blood work today. The patient will followup through this office if needed. Further evaluation will be done if a stroke event  is documented. The patient may benefit from speech therapy.  Jill Alexanders MD 12/26/2013 7:19 PM  [facility] [address] [place], [place] [address]  Phone [phone] Fax [phone]

## 2013-12-27 LAB — RPR: RPR: NONREACTIVE

## 2013-12-27 LAB — COPPER, SERUM: Copper: 95 ug/dL (ref 72–166)

## 2013-12-27 LAB — VITAMIN B12: Vitamin B-12: 685 pg/mL (ref 211–946)

## 2014-01-02 ENCOUNTER — Other Ambulatory Visit: Payer: MEDICARE

## 2014-01-16 ENCOUNTER — Ambulatory Visit: Payer: MEDICARE | Admitting: Internal Medicine

## 2014-01-22 ENCOUNTER — Ambulatory Visit: Payer: Self-pay | Admitting: Family Medicine

## 2014-01-22 ENCOUNTER — Encounter: Payer: Self-pay | Admitting: Internal Medicine

## 2014-01-22 ENCOUNTER — Ambulatory Visit (INDEPENDENT_AMBULATORY_CARE_PROVIDER_SITE_OTHER): Payer: MEDICARE | Admitting: Internal Medicine

## 2014-01-22 ENCOUNTER — Telehealth: Payer: Self-pay | Admitting: *Deleted

## 2014-01-22 VITALS — BP 150/80 | HR 68 | Temp 97.1°F | Wt 158.0 lb

## 2014-01-22 DIAGNOSIS — L500 Allergic urticaria: Secondary | ICD-10-CM

## 2014-01-22 MED ORDER — PREDNISONE 20 MG PO TABS: 20.0000 mg | ORAL_TABLET | Freq: Two times a day (BID) | ORAL | Status: DC

## 2014-01-22 MED ORDER — HYDROXYZINE HCL 10 MG PO TABS: 10.0000 mg | ORAL_TABLET | Freq: Three times a day (TID) | ORAL | Status: DC | PRN

## 2014-01-22 NOTE — Progress Notes (Signed)
   Subjective:    Patient ID: Jack Noble, male    DOB: 02/09/1929, 80 y.o.   MRN: [id number]  HPI  He has had a red, raised itchy rash as of 1.5 days ago in groin area and then spread to axilla, lower abdomen, buttocks. He noticed itching first and then realized he has a rash. Called office yesterday and spoke with nurse who told them to use OTC 1% hydrocortisone. This has helped the itching. No known environmental triggers. He did finish a ten day course of amoxicillin the day before the rash began. He does not recall taking amoxicillin in the past.           Review of Systems Denies fever sweats, chills.  No itchy, watery eyes, congestion, sneezing.  He also denies angioedema, shortness of breath, or wheezing.      Objective:   Physical Exam  Gen.:  Adequately nourished in appearance. Alert, appropriate and cooperative throughout exam.  Head: Normocephalic without obvious abnormalities  Eyes: No corneal or conjunctival inflammation noted. Pupils equal round reactive to light and accommodation.  Mouth: Oral mucosa and oropharynx reveal no lesions or exudates.  Neck: No deformities, masses, or tenderness noted.  Lungs: Normal respiratory effort; chest expands symmetrically. Lungs are clear to auscultation without rales, wheezes, or increased work of breathing.  Heart: Normal rate and rhythm. Normal S1 and S2. No gallop, click, or rub.  Abdomen: Bowel sounds normal; abdomen soft and nontender. No masses, organomegaly. Large ventral hernia noted.  Able to lie down & sit up w/o help.  Skin: raised red papular rash noted across  lower abdomen @ waist level, her circular plaques in bilateral axillae . Violaceous rash in each inguinal area with some purplish discoloration.  Mild dermatographia can be elicited Lymph: No cervical, axillary or axillary lymphadenopathy present.  Psych: Mood and affect are normal. Normally interactive        Assessment & Plan:  #1 urticaria See  orders

## 2014-01-22 NOTE — Telephone Encounter (Signed)
[facility] Triage Call Report Triage Record Num: [id number] Operator: Soledad Gerlach Patient Name: Jack Noble Call Date & Time: 01/21/2014 3:30:39PM Patient Phone: [phone] PCP: Unice Cobble Patient Gender: Male PCP Fax : [phone] Patient DOB: 09-30-1929 Practice Name[facility] Reason for Call: Caller: Margaret/Spouse; PCP: Other; CB#: [phone]; Call regarding Welts in groin area. Onset of hives to groin area 01/21/14. Finished amoxicillin after dental work 01/20/14. Some bumps have a blister component, and some are definitely hive-like. Some are moist. Per rash protocol, emergent symptoms denied; advised appt within 24 hours. Appt scheduled 1630 01/22/14 with Dr. Linna Darner at [place] office per patient preference. Care advice given, with callback parameters. Protocol(s) Used: Rash Recommended Outcome per Protocol: See Provider within 24 hours Reason for Outcome: New signs and symptoms of local infection Care Advice: ~ Call provider if symptoms worsen or new symptoms develop. ~ SYMPTOM / CONDITION MANAGEMENT ~ INFECTION CONTROL ~ CAUTIONS ~ List, or take, all current prescription(s), nonprescription or alternative medication(s) to provider for evaluation

## 2014-01-22 NOTE — Progress Notes (Signed)
Pre visit review using our clinic review tool, if applicable. No additional management support is needed unless otherwise documented below in the visit note. 

## 2014-01-22 NOTE — Patient Instructions (Signed)
Avoid perfumes and cosmetics which are not hypoallergenic. Restrict hyperallergenic foods at this time: Nuts, strawberries, seafood , chocolate, and tomatoes.After showering  use a hairdryer to blow the inguinal area dry. Also do this after any activities which cause sweating.

## 2014-01-22 NOTE — Progress Notes (Signed)
HPI: red, raised itchy rash began 1.5 days ago in groin area and then spread to axilla, lower abdomen, buttocks. He noticed itching first and then realized he has a rash. Called office yesterday and spoke with nurse who told them to use OTC 1% hydrocortisone. No known environmental triggers. He did finish a ten day course of amoxicillin the day before the rash began. He does not recall taking amoxicillin in the past.   ROS:  He does not recall taking amoxicillin in the past.  Denies fever sweats, chills.  No itchy, watery eyes, congestion, sneezing.    Gen.: Healthy and well-nourished in appearance. Alert, appropriate and cooperative throughout exam. Head: Normocephalic without obvious abnormalities Eyes: No corneal or conjunctival inflammation noted. Pupils equal round reactive to light and accommodation.  Mouth: Oral mucosa and oropharynx reveal no lesions or exudates.  Neck: No deformities, masses, or tenderness noted. Lungs: Normal respiratory effort; chest expands symmetrically. Lungs are clear to auscultation without rales, wheezes, or increased work of breathing. Heart: Normal rate and rhythm. Normal S1 and S2. No gallop, click, or rub.  Abdomen: Bowel sounds normal; abdomen soft and nontender. No masses, organomegaly. Large ventral hernia noted.  Able to lie down & sit up w/o help.   Skin: red papular rash noted on lower abdomen, bilateral axillae Lymph: No cervical, axillary lymphadenopathy present. Psych: Mood and affect are normal. Normally interactive

## 2014-02-14 DIAGNOSIS — H4050X0 Glaucoma secondary to other eye disorders, unspecified eye, stage unspecified: Secondary | ICD-10-CM | POA: Diagnosis not present

## 2014-02-14 DIAGNOSIS — H409 Unspecified glaucoma: Secondary | ICD-10-CM | POA: Diagnosis not present

## 2014-02-22 DIAGNOSIS — N400 Enlarged prostate without lower urinary tract symptoms: Secondary | ICD-10-CM | POA: Diagnosis not present

## 2014-02-22 DIAGNOSIS — R350 Frequency of micturition: Secondary | ICD-10-CM | POA: Diagnosis not present

## 2014-02-22 DIAGNOSIS — R351 Nocturia: Secondary | ICD-10-CM | POA: Diagnosis not present

## 2014-03-05 ENCOUNTER — Ambulatory Visit (INDEPENDENT_AMBULATORY_CARE_PROVIDER_SITE_OTHER): Payer: MEDICARE | Admitting: Family Medicine

## 2014-03-05 ENCOUNTER — Encounter: Payer: Self-pay | Admitting: Family Medicine

## 2014-03-05 ENCOUNTER — Telehealth: Payer: Self-pay | Admitting: Internal Medicine

## 2014-03-05 VITALS — BP 148/82 | HR 70 | Temp 98.0°F

## 2014-03-05 DIAGNOSIS — R42 Dizziness and giddiness: Secondary | ICD-10-CM | POA: Diagnosis not present

## 2014-03-05 DIAGNOSIS — N400 Enlarged prostate without lower urinary tract symptoms: Secondary | ICD-10-CM | POA: Diagnosis not present

## 2014-03-05 DIAGNOSIS — Z87898 Personal history of other specified conditions: Secondary | ICD-10-CM

## 2014-03-05 DIAGNOSIS — I10 Essential (primary) hypertension: Secondary | ICD-10-CM | POA: Diagnosis not present

## 2014-03-05 DIAGNOSIS — Z8669 Personal history of other diseases of the nervous system and sense organs: Secondary | ICD-10-CM | POA: Diagnosis not present

## 2014-03-05 DIAGNOSIS — R4701 Aphasia: Secondary | ICD-10-CM

## 2014-03-05 LAB — POCT URINALYSIS DIPSTICK
BILIRUBIN UA: NEGATIVE
Glucose, UA: NEGATIVE
KETONES UA: NEGATIVE
Leukocytes, UA: NEGATIVE
Nitrite, UA: NEGATIVE
PH UA: 5.5
Protein, UA: NEGATIVE
Spec Grav, UA: <=1.005
Urobilinogen, UA: 0.2

## 2014-03-05 NOTE — Addendum Note (Signed)
Addended by: Colleen Can on: 03/05/2014 01:55 PM   Modules accepted: Orders

## 2014-03-05 NOTE — Progress Notes (Signed)
Chief Complaint  Patient presents with  . Extremity Weakness    HPI:  "Feeling Bad": -started over 6 months ago, felt a little weak after breakfast and felt a little lightheaded after breakfast -he wanted to check his blood pressure, hx of HTN, on toprol in the past for this but he did not tolerate it -urologist recently stopped his tamsulosin due to lightheadedness but not better of medication  - saw pharmacist this weekend and told his BP could go up due to stopping this mediction - hospitalization in July of 2014 for sepsis with a UTI with fever up to 106 and has had lightheadedness, speech problems since and told this was a mini stroke that would take 6 months to recover from - but it has been more then 6 months -feels better now -reports saw neurologist for this chronic problem but did not like the neurologist -denies: CP, palpitations, SOB, HA, swelling, fevers, abd pain, urinary symptoms  Mechanical Fall: -tripped on the curve in parking lot and landed on knees and outstretched arms - reports was not feeling lightheaded or dizzy when this occurred - strictly mechanical and did not hit head or suffer injury -able to bear weight -denies: head injury, LOC, pain after fall  ROS: See pertinent positives and negatives per HPI.  Past Medical History  Diagnosis Date  . Dermatophytosis of nail   . Hyperlipidemia   . Hearing loss   . Hyperplasia of prostate without urinary obstruction   . Cataracts, bilateral   . Rosacea     Dr. Marge Duncans  . Allergy   . Glaucoma     Dr. Edilia Bo  . Corneal abrasion     Dr. Ned Clines, [facility] Op[facility]  . Sepsis due to Escherichia coli (E. coli) 05/2011    Morganella also cultured  . Diverticulosis   . TIA (transient ischemic attack) 9/6-05/2012    BP 221/99  . Epistaxis   . Aphasia 12/26/2013    Past Surgical History  Procedure Laterality Date  . Tonsillectomy and adenoidectomy    . Appendectomy      age 72  . Cataract extraction, bilateral  2003    Dr  Ishmael Holter (now seen @ [place])  . Inguinal hernia repair      age 32  . Colonoscopy w/ polypectomy  06/2009    Dr.Jacobs; "miniscule polyp"  . Cholecystectomy  1978    stones    Family History  Problem Relation Age of Onset  . Stroke Mother 75  . Parkinsonism Brother     Brother died at age at age 60  . Pulmonary embolism Father 7    Post Op  . Diabetes Neg Hx   . Cancer Neg Hx   . COPD Neg Hx   . Heart disease Neg Hx     History   Social History  . Marital Status: Married    Spouse Name: N/A    Number of Children: 1  . Years of Education: college   Occupational History  . [employer]    Social History Main Topics  . Smoking status: Never Smoker   . Smokeless tobacco: Never Used  . Alcohol Use: No  . Drug Use: No  . Sexual Activity: None   Other Topics Concern  . None   Social History Narrative   Lives with his wife.      Current outpatient prescriptions:aspirin EC 81 MG tablet, Take 162 mg by mouth every morning., Disp: , Rfl: ;  Cholecalciferol (VITAMIN D) 1000  UNITS capsule, Take 1,000 Units by mouth every morning. , Disp: , Rfl: ;  finasteride (PROSCAR) 5 MG tablet, Take 5 mg by mouth daily., Disp: , Rfl: ;  mometasone (ELOCON) 0.1 % ointment, Apply 1 application topically 2 (two) times daily as needed (Applies to affected area on hands.)., Disp: , Rfl:  Multiple Vitamin (MULTIVITAMIN WITH MINERALS) TABS, Take 1 tablet by mouth every morning. , Disp: , Rfl: ;  nystatin-triamcinolone (MYCOLOG II) cream, Apply 1 application topically daily as needed (Applies to red places in groin area.)., Disp: , Rfl: ;  predniSONE (DELTASONE) 20 MG tablet, Take 1 tablet (20 mg total) by mouth 2 (two) times daily., Disp: 14 tablet, Rfl: 0 tamsulosin (FLOMAX) 0.4 MG CAPS capsule, Take 0.4 mg by mouth every morning., Disp: , Rfl: ;  Timolol Maleate (ISTALOL) 0.5 % (DAILY) SOLN, Place 1 drop into the left eye 2 (two) times daily at 8 am and 10 pm. , Disp: , Rfl:    EXAM:  Filed Vitals:   03/05/14 1309  BP: 148/82  Pulse: 70    There is no weight on file to calculate BMI.  GENERAL: vitals reviewed and listed above, alert, oriented, appears well hydrated and in no acute distress  HEENT: atraumatic, conjunttiva clear, no obvious abnormalities on inspection of external nose and ears  NECK: no obvious masses on inspection  LUNGS: clear to auscultation bilaterally, no wheezes, rales or rhonchi, good air movement  CV: HRRR, no peripheral edema  MS: moves all extremities without noticeable abnormality -normal gait -no weakness in lower or upper extremities -small abrasions both knees, no TTP or bony TTP  PSYCH: pleasant and cooperative, no obvious depression or anxiety  NEURO: CNII-XII groslly intact, R pupil slightly irr in shape but pr reports this is chronic, word finding difficulty at times, finger to nose normal, muscle strength throughout grossly intact, sensation to light touch grossly intact  ASSESSMENT AND PLAN:  Discussed the following assessment and plan:  Hypertension  BPH (benign prostatic hyperplasia)  History of short term memory loss  Lightheadedness  Aphasia  ->45 minutes spent face to face with this patient in counseling -no notable findings of injury from fall and seems was purely mechanical -his BP and lightheadedness have been ongoing chornically and he reports unchanged from in the past but had medicaiton questions-we discussed this at length and opted to restart tamsulosin and follow up with PCP -I advised we check urine today given several very serious urinary infections and his symptoms, thought they denied any urine symptoms and were not concerned for a UTI  -offered referral to a different neurologist but they declined and opted to see PCP -follow up as needed and will PCP to recheck BP  -Patient advised to return or notify a doctor immediately if symptoms worsen or persist or new concerns  arise.  Patient Instructions  -restart tamsulosin  -consider seeing a neurologist and let doctor hopper know if you wish to see a different neurologist  -follow up with your doctor (Dr. Linna Darner) in 2 weeks to recheck blood pressure and determine next step  -in the meantime if any worsening or changing symptoms or concerns see a doctor immediataly       Jack Noble

## 2014-03-05 NOTE — Telephone Encounter (Signed)
Patient Information:  Caller Name: Joycelyn Schmid  Phone: [phone]  Patient: Jack Noble  Gender: Male  DOB: 1929-06-20  Age: 80 Years  PCP: Unice Cobble  Office Follow Up:  Does the office need to follow up with this patient?: No  Instructions For The Office: N/A   Symptoms  Reason For Call & Symptoms: Wife caling about pt having a weak spell and some lightheadedness. Onset today, 03/05/14 at 1030. Does not otherwise seem ill. I/O wnl. Not diabetic. BP machine reads too high per MD at last visit. Temp is 96.9-oral. Pt is able to safely ambulate on his own.  Reviewed Health History In EMR: Yes  Reviewed Medications In EMR: Yes  Reviewed Allergies In EMR: Yes  Reviewed Surgeries / Procedures: Yes  Date of Onset of Symptoms: 03/05/2014  Treatments Tried: resting  Treatments Tried Worked: No  Guideline(s) Used:  Weakness (Generalized) and Fatigue  Disposition Per Guideline:   Go to Office Now  Reason For Disposition Reached:   Moderate weakness (i.e., interferes with work, school, normal activities) and cause unknown  Advice Given:  N/A  Patient Will Follow Care Advice:  YES  Appointment Scheduled:  03/05/2014 13:00:00 Appointment Scheduled Provider:  Colin Benton

## 2014-03-05 NOTE — Patient Instructions (Addendum)
-  restart tamsulosin  -consider seeing a neurologist and let doctor hopper know if you wish to see a different neurologist  -follow up with your doctor (Dr. Linna Darner) in 2 weeks to recheck blood pressure and determine next step  -in the meantime if any worsening or changing symptoms or concerns see a doctor immediataly

## 2014-03-05 NOTE — Progress Notes (Signed)
Pre visit review using our clinic review tool, if applicable. No additional management support is needed unless otherwise documented below in the visit note. 

## 2014-03-06 ENCOUNTER — Telehealth: Payer: Self-pay | Admitting: Internal Medicine

## 2014-03-06 NOTE — Telephone Encounter (Signed)
Relevant patient education assigned to patient using Emmi. ° °

## 2014-03-07 LAB — URINE CULTURE
Colony Count: NO GROWTH
Organism ID, Bacteria: NO GROWTH

## 2014-03-16 DIAGNOSIS — H18459 Nodular corneal degeneration, unspecified eye: Secondary | ICD-10-CM | POA: Diagnosis not present

## 2014-03-16 DIAGNOSIS — Z961 Presence of intraocular lens: Secondary | ICD-10-CM | POA: Diagnosis not present

## 2014-03-22 ENCOUNTER — Encounter: Payer: Self-pay | Admitting: Internal Medicine

## 2014-03-22 ENCOUNTER — Other Ambulatory Visit (INDEPENDENT_AMBULATORY_CARE_PROVIDER_SITE_OTHER): Payer: MEDICARE

## 2014-03-22 ENCOUNTER — Ambulatory Visit (INDEPENDENT_AMBULATORY_CARE_PROVIDER_SITE_OTHER): Payer: MEDICARE | Admitting: Internal Medicine

## 2014-03-22 VITALS — BP 140/80 | HR 64 | Temp 97.2°F | Wt 160.0 lb

## 2014-03-22 DIAGNOSIS — D649 Anemia, unspecified: Secondary | ICD-10-CM | POA: Diagnosis not present

## 2014-03-22 DIAGNOSIS — R29898 Other symptoms and signs involving the musculoskeletal system: Secondary | ICD-10-CM | POA: Diagnosis not present

## 2014-03-22 LAB — IBC PANEL
Iron: 87 ug/dL (ref 42–165)
Saturation Ratios: 31.7 % (ref 20.0–50.0)
TRANSFERRIN: 195.8 mg/dL — AB (ref 212.0–360.0)

## 2014-03-22 LAB — CBC WITH DIFFERENTIAL/PLATELET
BASOS ABS: 0 10*3/uL (ref 0.0–0.1)
Basophils Relative: 0.7 % (ref 0.0–3.0)
EOS ABS: 0.2 10*3/uL (ref 0.0–0.7)
Eosinophils Relative: 3.3 % (ref 0.0–5.0)
HCT: 42.6 % (ref 39.0–52.0)
Hemoglobin: 14.3 g/dL (ref 13.0–17.0)
LYMPHS PCT: 27.2 % (ref 12.0–46.0)
Lymphs Abs: 1.8 10*3/uL (ref 0.7–4.0)
MCHC: 33.7 g/dL (ref 30.0–36.0)
MCV: 97 fl (ref 78.0–100.0)
MONOS PCT: 7.5 % (ref 3.0–12.0)
Monocytes Absolute: 0.5 10*3/uL (ref 0.1–1.0)
NEUTROS ABS: 4.1 10*3/uL (ref 1.4–7.7)
NEUTROS PCT: 61.3 % (ref 43.0–77.0)
PLATELETS: 214 10*3/uL (ref 150.0–400.0)
RBC: 4.39 Mil/uL (ref 4.22–5.81)
RDW: 12.9 % (ref 11.5–14.6)
WBC: 6.6 10*3/uL (ref 4.5–10.5)

## 2014-03-22 NOTE — Progress Notes (Signed)
Jack Noble [id number] 03/22/2014  Chief Complaint  Patient presents with  . Speech Problem    Subjective  HPI  July 2014 UTI with slurred speech "acted like a mini stroke". Fever.   It will eventually. Tired of feeling bad. Gets tired and gets weak, and legs are hurting. Feels better after eating. No SOB, CP, NO myalgias. Since UTI back in July. Eating mid morning and mid afternoon. Not monitoring. Stable weight. Headache. No depressive sxs. Goes to [employer]. Waking up 2 days at night. Drinks water. Couple tiems. No pain when he goes to the bathroom. No incontenicne. Urologist checks to make sure he is emptying bladder. No blood, no abdominal pain. No bowel changes. No straining. No fevers, no night sweats, body aches chills, sometimes gets muscle aches. Sometimes wakes up and feels calf pain. No hx of clots.No pain in calf. Comes on every couple of months.  In April, fell. Caught toe. And helped him. No fatigue. Gets tired[place] Eating healthy. [place].   April 13/15. Hannah in [place]. [place] going into the office. Dr. Jannifer Franklin.   Went to urologist: off of tamsulosin because of problems. IN 2 months, June 2, Urologist will determine if needs to go back on.   08/07/13 CT scan negative.   Saw Willis on 12/26/13. Would like to see a different neurologist.   CBC CENTRUM SILVER for B12. CBC,   Past Medical History  Diagnosis Date  . Dermatophytosis of nail   . Hyperlipidemia   . Hearing loss   . Hyperplasia of prostate without urinary obstruction   . Cataracts, bilateral   . Rosacea     Dr. Marge Duncans  . Allergy   . Glaucoma     Dr. Edilia Bo  . Corneal abrasion     Dr. Ned Clines, [facility] Op[facility]  . Sepsis due to Escherichia coli (E. coli) 05/2011    Morganella also cultured  . Diverticulosis   . TIA (transient ischemic attack) 9/6-05/2012    BP 221/99  . Epistaxis   . Aphasia 12/26/2013    Past Surgical History  Procedure Laterality Date  . Tonsillectomy and  adenoidectomy    . Appendectomy      age 72  . Cataract extraction, bilateral  2003    Dr Ishmael Holter (now seen @ [place])  . Inguinal hernia repair      age 55  . Colonoscopy w/ polypectomy  06/2009    Dr.Jacobs; "miniscule polyp"  . Cholecystectomy  1978    stones    Family History  Problem Relation Age of Onset  . Stroke Mother 83  . Parkinsonism Brother     Brother died at age at age 39  . Pulmonary embolism Father 62    Post Op  . Diabetes Neg Hx   . Cancer Neg Hx   . COPD Neg Hx   . Heart disease Neg Hx     History  Substance Use Topics  . Smoking status: Never Smoker   . Smokeless tobacco: Never Used  . Alcohol Use: No    Current Outpatient Prescriptions on File Prior to Visit  Medication Sig Dispense Refill  . aspirin EC 81 MG tablet Take 162 mg by mouth every morning.      . Cholecalciferol (VITAMIN D) 1000 UNITS capsule Take 1,000 Units by mouth every morning.       . finasteride (PROSCAR) 5 MG tablet Take 5 mg by mouth daily.      . mometasone (  ELOCON) 0.1 % ointment Apply 1 application topically 2 (two) times daily as needed (Applies to affected area on hands.).      Marland Kitchen Multiple Vitamin (MULTIVITAMIN WITH MINERALS) TABS Take 1 tablet by mouth every morning.       . nystatin-triamcinolone (MYCOLOG II) cream Apply 1 application topically daily as needed (Applies to red places in groin area.).      Marland Kitchen Timolol Maleate (ISTALOL) 0.5 % (DAILY) SOLN Place 1 drop into the left eye 2 (two) times daily at 8 am and 10 pm.       . tamsulosin (FLOMAX) 0.4 MG CAPS capsule Take 0.4 mg by mouth every morning.       No current facility-administered medications on file prior to visit.     Allergies: Allergies  Allergen Reactions  . Amoxicillin     urticaria  . Codeine     Rash Because of a history of documented adverse serious drug reaction;Medi Alert bracelet  is recommended  . Minocycline Hcl     REACTION: nervous/chest pressure (Note: may have affected GE valve)  .  Astelin [Azelastine Hcl]     Makes me jittery  . Ether     pneumonia  . Zocor [Simvastatin] Other (See Comments)    Itching w/o, muscle problems and it affected his stomach  . Metoprolol     lightheaded    ROS     Objective  Filed Vitals:   03/22/14 1351  BP: 140/80  Pulse: 64  Temp: 97.2 F (36.2 C)  TempSrc: Oral  Weight: 160 lb (72.576 kg)  SpO2: 97%    Physical Exam  BP Readings from Last 3 Encounters:  03/22/14 140/80  03/05/14 148/82  01/22/14 150/80    Wt Readings from Last 3 Encounters:  03/22/14 160 lb (72.576 kg)  01/22/14 158 lb (71.668 kg)  12/26/13 159 lb (72.122 kg)    Lab Results  Component Value Date   WBC 5.8 12/08/2013   HGB 13.1 12/08/2013   HCT 38.9* 12/08/2013   PLT 197.0 12/08/2013   GLUCOSE 102* 12/08/2013   CHOL 134 05/18/2013   TRIG 89.0 05/18/2013   HDL 52.50 05/18/2013   LDLCALC 64 05/18/2013   ALT 18 12/08/2013   AST 19 12/08/2013   NA 139 12/08/2013   K 4.2 12/08/2013   CL 106 12/08/2013   CREATININE 0.6 12/08/2013   BUN 12 12/08/2013   CO2 30 12/08/2013   TSH 1.47 12/08/2013   PSA 1.02 05/07/2011   INR 1.09 07/30/2012   HGBA1C 5.5 12/11/2013    Dg Chest 2 View  07/24/2013   *RADIOLOGY REPORT*  Clinical Data: Shortness of breath.  CHEST - 2 VIEW  Comparison: 07/30/2012  Findings: The cardiomediastinal silhouette is unremarkable. This is a mildly low volume film. Posterior lower lobe opacity on the lateral view likely represents atelectasis but airspace disease/pneumonia is not excluded. There is no evidence of pleural effusion, pneumothorax or pulmonary mass.  IMPRESSION: Posterior lower lobe opacity in the lateral view - favor atelectasis over airspace disease/pneumonia.   Original Report Authenticated By: Margarette Canada, M.D.   Ct Head W Contrast  07/24/2013   CLINICAL DATA:  80 year old male altered mental status. CT of the abdomen and pelvis with contrast performed 20 min earlier.  EXAM: CT HEAD WITHOUT CONTRAST  TECHNIQUE: Contiguous axial  images were obtained from the base of the skull through the vertex without intravenous contrast.  COMPARISON:  07/30/2012 and earlier.  FINDINGS: Visualized paranasal sinuses and mastoids are  clear. No acute osseous abnormality identified. Stable orbit and scalp soft tissues.  Intravascular contrast is present from the exam 20 min earlier. Mild Calcified atherosclerosis at the skull base. Incidental choroid plexus cysts. No midline shift, ventriculomegaly, mass effect, evidence of mass lesion, intracranial hemorrhage or evidence of cortically based acute infarction. Minimal to mild for age subcortical white matter hypodensity. Otherwise gray -white matter differentiation is within normal limits throughout the brain. Stable cerebral volume. No abnormal enhancement identified.  IMPRESSION: Stable and negative for age CT appearance of the brain ; intravascular contrast is present from a contrast-enhanced CT Abdomen and Pelvis performed 20 minutes prior to this exam.   Electronically Signed   By: Lars Pinks   On: 07/24/2013 19:06   Ct Abdomen Pelvis W Contrast  07/24/2013   CLINICAL DATA:  Vomiting, right lower quadrant hernia that has enlarged.  EXAM: CT ABDOMEN AND PELVIS WITH CONTRAST  TECHNIQUE: Multidetector CT imaging of the abdomen and pelvis was performed using the standard protocol following bolus administration of intravenous contrast.  CONTRAST:  53mL OMNIPAQUE IOHEXOL 300 MG/ML SOLN, 161mL OMNIPAQUE IOHEXOL 300 MG/ML SOLN  COMPARISON:  06/01/2011  FINDINGS: Dependent atelectasis in the lung bases. No effusions. Heart is normal size.  There are 2 midline ventral hernias noted, both containing fat only. Prior cholecystectomy. Liver, spleen, pancreas, adrenals and kidneys are unremarkable. No hydronephrosis.  Extensive and diffuse colonic diverticulosis. No active diverticulitis. Small bowel is decompressed.  Foley catheter is present in the bladder which is decompressed. No free fluid, free air or adenopathy.  Prostate calcifications and prominence.  No acute bony abnormality.  IMPRESSION: Two midline ventral hernias containing fat.  Diffuse colonic diverticulosis.   Electronically Signed   By: Rolm Baptise   On: 07/24/2013 18:47       Assessment and Plan  No Follow-up on file. Berenice Bouton, Student-PA

## 2014-03-22 NOTE — Patient Instructions (Signed)
Mid am & mid afternoon symptoms could indicate "reactive hypoglycemia", low sugars due to the pancreas excreting excess insulin in response to glucose elevations from " hyperglycemic carbs"  in diet. Eat a low-fat diet with lots of fruits and vegetables, up to 7-9 servings per day. Consume less than 40 grams of sugar per day from foods & drinks with High Fructose Corn Sugar as #1,2,3 or # 4 on label. Follow a low carb nutrition program such as [employer] [place] or [place] to prevent wide sugar swings. White carbohydrates (potatoes, rice, bread, and pasta) have a high spike of sugar and a high load of sugar. For example a  baked potato has a cup of sugar and a  french fry  2 teaspoons of sugar. Yams, wild  rice, whole grained bread &  wheat pasta have been much lower spike and load of  sugar. Portions should be the size of a deck of cards or your palm.

## 2014-03-22 NOTE — Progress Notes (Signed)
Pre visit review using our clinic review tool, if applicable. No additional management support is needed unless otherwise documented below in the visit note. 

## 2014-03-23 NOTE — Progress Notes (Signed)
   Subjective:    Patient ID: Jack Noble, male    DOB: [birth year], 80 y.o.   MRN: [id number]  HPI  His chief concern is episodic weakness; this is  described as significant weakness in the legs usually midmorning & midafternoon. He will typically have up to three these episodes a week. They are not daily and occurrence. He has taken a snack such as peanut butter crackers with resolution of symptoms. His wife states that he's not eating high fructose corn syrup or hyperglycemic carbs. His A-1 C has been in the nondiabetic range.   Review of Systems  He denies any bleeding dyscrasia. He previously had epistaxis but this was treated successfully by as otolaryngologist, Dr. Lucia Gaskins. He specifically denies hemoptysis, melanoma, rectal bleeding, or hematuria.   He continues to be frustrated by word recall issues and slurred speech. He has seen his neurologist, Dr. Jannifer Franklin.  Because of weak spells his urologist stopped his Flomax.       Objective:   Physical Exam Gen.: well-nourished in appearance. Alert, appropriate and cooperative throughout exam. Appears younger than stated age  Head: Normocephalic without obvious abnormalities; pattern alopecia  Eyes: No corneal or conjunctival inflammation noted. No icterus Nose: External nasal exam reveals no deformity or inflammation. Nasal mucosa are pink and moist. No lesions or exudates noted.   Mouth: Oral mucosa and oropharynx reveal no lesions or exudates. Teeth in good repair. Neck: No deformities, masses, or tenderness noted. Thyroid normal. Lungs: Normal respiratory effort; chest expands symmetrically. Lungs are clear to auscultation without rales, wheezes, or increased work of breathing. Heart: Normal rate and rhythm. Normal S1 and S2. No gallop, click, or rub.No murmur. Abdomen: Bowel sounds normal; abdomen soft and nontender. No masses,or organomegaly . Large incisional/hernia noted.                                  Musculoskeletal/extremities: No clubbing, cyanosis, edema, or significant extremity  deformity noted. Tone & strength normal. Able to lie down & sit up w/o help. Negative SLR bilaterally Vascular: Carotid, radial artery, dorsalis pedis and  posterior tibial pulses are full and equal. No bruits present. Neurologic: Alert and oriented x3. Deep tendon reflexes symmetrical and normal. Exhibiting some difficulty with word recall. Some stuttering pattern to his speech Gait slow      Skin: Intact without suspicious lesions or rashes. Lymph: No cervical, axillary lymphadenopathy present. Psych: Mood and affect are normal. Normally interactive                                                                                        Assessment & Plan:  #1Weakness, episodic. Rule out reactive hypoglycemia. See AVS

## 2014-04-14 DIAGNOSIS — R1031 Right lower quadrant pain: Secondary | ICD-10-CM | POA: Diagnosis not present

## 2014-04-17 DIAGNOSIS — L719 Rosacea, unspecified: Secondary | ICD-10-CM | POA: Diagnosis not present

## 2014-04-17 DIAGNOSIS — L570 Actinic keratosis: Secondary | ICD-10-CM | POA: Diagnosis not present

## 2014-05-01 DIAGNOSIS — R351 Nocturia: Secondary | ICD-10-CM | POA: Diagnosis not present

## 2014-05-01 DIAGNOSIS — N400 Enlarged prostate without lower urinary tract symptoms: Secondary | ICD-10-CM | POA: Diagnosis not present

## 2014-05-01 DIAGNOSIS — R350 Frequency of micturition: Secondary | ICD-10-CM | POA: Diagnosis not present

## 2014-05-07 ENCOUNTER — Other Ambulatory Visit: Payer: MEDICARE

## 2014-05-07 ENCOUNTER — Ambulatory Visit (INDEPENDENT_AMBULATORY_CARE_PROVIDER_SITE_OTHER): Payer: MEDICARE | Admitting: Internal Medicine

## 2014-05-07 ENCOUNTER — Encounter: Payer: Self-pay | Admitting: Internal Medicine

## 2014-05-07 VITALS — BP 156/76 | HR 84 | Temp 98.5°F | Wt 158.0 lb

## 2014-05-07 DIAGNOSIS — G450 Vertebro-basilar artery syndrome: Secondary | ICD-10-CM | POA: Diagnosis not present

## 2014-05-07 DIAGNOSIS — R350 Frequency of micturition: Secondary | ICD-10-CM

## 2014-05-07 DIAGNOSIS — R319 Hematuria, unspecified: Secondary | ICD-10-CM | POA: Diagnosis not present

## 2014-05-07 DIAGNOSIS — B354 Tinea corporis: Secondary | ICD-10-CM | POA: Diagnosis not present

## 2014-05-07 DIAGNOSIS — R4701 Aphasia: Secondary | ICD-10-CM | POA: Diagnosis not present

## 2014-05-07 LAB — POCT URINALYSIS DIPSTICK
Glucose, UA: NEGATIVE
Ketones, UA: NEGATIVE
Leukocytes, UA: NEGATIVE
NITRITE UA: NEGATIVE
PROTEIN UA: NEGATIVE
Spec Grav, UA: 1.015
Urobilinogen, UA: 0.2
pH, UA: 6

## 2014-05-07 MED ORDER — KETOCONAZOLE 2 % EX CREA: 1.0000 "application " | TOPICAL_CREAM | Freq: Two times a day (BID) | CUTANEOUS | Status: DC | PRN

## 2014-05-07 NOTE — Progress Notes (Signed)
   Subjective:    Patient ID: Jack Noble, male    DOB: 1929/06/17, 80 y.o.   MRN: [id number]  HPI  On 04/30/14 he was in a small poorly ventilated room in the church placing programs over his head for storage. He became diaphoretic noted clamminess of his hands and felt faint. The episode lasted about 45 minutes. At that time there was no associated headache, nausea, chest pain, or dyspnea. His Minister applied cool compresses to his head & gave him water and crackers with symptom resolution. The symptoms have not recurred since      Review of Systems  He describes sharp right frontal headache which lasted less than 5 seconds earlier today upon awakening.. There was no radiation or associated sequela. No treatment was implemented.  He's had increased urinary frequency. This is more often in the morning. This morning he was urinating every 7 minutes. He is being actively treated by his urologist   There is no associated dysuria, pyuria, or hematuria. Urine is not malodorous. He has no hesitancy.  He has noted a rash on his right lateral thorax with a smaller lesion on the left as well.  He denies fever, chills, sweats, weight loss       Objective:   Physical Exam  He appears in no acute distress. He appears his stated age  He has alopecia totalis  There is no conjunctivitis or scleral icterus  Pupils were equally round reactive to light. Extraocular motions intact  There is  no lymphadenopathy of the neck or axilla  No carotid bruits present   No increased work of breathing or abnormal breath sounds. He defers to his wife as he attempts to tell history because of severe word retrieval issues.  He has a regular rhythm without murmur or gallop. There are no carotid bruits noted  There is a large ventral hernia on the right. No tenderness or other masses.  Frustrated by aphasia. Oriented X 3. Gait is broad & slow.  Classic Tinea R lateral thorax; smaller lesion  on left in same location.       Assessment & Plan:  #1 vertebrobasilar insufficiency  #2 increasing word retrieval issues/dysphasia. He was previously evaluated by Dr. Jannifer Franklin for this  #3 tinea versicolor  #4 headache x1, transitory  #5 urinary frequency with microscopic hematuria  Plan: See orders and recommendations  If urine culture is normal; he should return to his urologist

## 2014-05-07 NOTE — Patient Instructions (Addendum)
The Neurology referral will be scheduled and you'll be notified of the time. When you hyperextend your neck to look over your head; this compromises the blood flow through the vertebral arteries and the basilar artery the two vertebral arteries join  to form. The basilar artery provides blood flow to the cerebellum, the balance portion of your brain. Typically there is also a component of arthritis in the cervical (neck) spine which helps to compromise the blood flow to the vertebrobasilar system with the neck hyperextended. Unfortunately the only treatment would be to avoid this head and neck position.  Please keep a diary of your headaches . Document  each occurrence on the calendar with notation of : #1 any prodrome ( any non headache symptom such as marked fatigue,visual changes, ,etc ) which precedes actual headache ; #2) severity on 1-10 scale; #3) any triggers ( food/ drink,enviromenntal or weather changes ,physical or emotional stress) in 8-12 hour period prior to the headache; & #4) response to any medications or other intervention. Please review "Headache" @ [facility] for additional information.

## 2014-05-07 NOTE — Progress Notes (Signed)
Subjective:    Patient ID: Jack Noble, male    DOB: December 26, 1928, 80 y.o.   MRN: [id number]  HPI Pt presents today to follow up on an episode from 04/30/14. On 04/30/14 pt was at church and had a 45 minute episode in which he was diaphoretic, had clammy hands, and felt faint. Pt was standing when this occurred and was doing some type of overhead movement, which he states he is not supposed to be doing. There was no associated headache, nausea, chest pain or SOB. The minister put a cool rag on the pt's head and gave him water and peanut butter crackers. Since this time the pt has not had another episode like so.     Significantly this morning the pt woke up with a headache on the R lateral side of his head. The pt's wife states he is slower to respond than usual and having difficulty finding his words. The headache has been constant since this morning.   The pt also notes increased urinary frequency. The pt's wife states he usually goes to the bathroom more often in the morning but that this morning he was going almost every 7 minutes which is more than usual. Since this time the urinary frequency has subsided There was no dysuria, hematuria, pyuria, malodorous urine, hesitancy. The pt did have urgency this morning with his frequent urination.   Additionally the pt noticed a rash on his R flank today. The rash does not itch nor is it painful. They have not tried to treat the rash.    Review of Systems  Constitutional: Negative for fever and fatigue.  Respiratory: Negative for shortness of breath.   Cardiovascular: Negative for chest pain.  Neurological: Negative for numbness.       Objective:   Physical Exam Gen.: Healthy and well-nourished in appearance. Alert, appropriate and cooperative throughout exam. Mild aphasia.   Eyes: No corneal or conjunctival inflammation or hemorrhages noted. Pupils equal round reactive to light and accommodation. Extraocular motion intact. No vertical or  lateral nystagmus.   Ears: External  ear exam reveals no significant lesions or deformities. Canals clear .TMs normal. Pt is HOH   Nose: L septal deviation. Nasal mucosa are pink and moist. No lesions or exudates noted.    Mouth: Oral mucosa and oropharynx reveal no lesions or exudates. Teeth in good repair.  Lungs: Normal respiratory effort; chest expands symmetrically. Lungs are clear to auscultation with decreased bases bilaterally.   Heart: Normal rate and rhythm. Normal S1 and S2. No gallop, click, or rub. No murmur.                           Musculoskeletal/extremities: Mild kyphosis.   Vascular: radial artery, dorsalis pedis pulses are full and equal.   Neurologic: Alert and oriented x3. Deep tendon reflexes symmetrical and normal. Shuffling gait. -Rhomberg & finger to nose        Skin: irregular patch with erythematous, scaly border with central clearing located on R flank.   Lymph: No cervical, axillary lymphadenopathy present.  Assessment & Plan:  #1 tinea corporis

## 2014-05-07 NOTE — Progress Notes (Signed)
Pre visit review using our clinic review tool, if applicable. No additional management support is needed unless otherwise documented below in the visit note. 

## 2014-05-08 ENCOUNTER — Telehealth: Payer: Self-pay | Admitting: Neurology

## 2014-05-08 DIAGNOSIS — R4701 Aphasia: Secondary | ICD-10-CM

## 2014-05-08 NOTE — Telephone Encounter (Signed)
I called patient, talked with the wife and the patient. The patient is now willing to do the CT of the head. I will perform this study with and without contrast. I'll contact him once I get the results.

## 2014-05-08 NOTE — Telephone Encounter (Signed)
I spoke with this patient spouse. Mr. Trombly is still having difficulty with his speech. Last office visit you order a CT scan. They did not have it done and the wife is not sure that he wants to have it done. You also stated that speech therapy would be beneficial. There is no referral in for this. I offered an appointment with [facility] and told them you could come in. She says she will call back.

## 2014-05-09 ENCOUNTER — Telehealth: Payer: Self-pay | Admitting: Neurology

## 2014-05-09 LAB — URINE CULTURE
Colony Count: NO GROWTH
Organism ID, Bacteria: NO GROWTH

## 2014-05-09 NOTE — Telephone Encounter (Signed)
I called patient. The CT scan head as been ordered with contrast as there is a history of slowly progressive aphasia. The purpose of the contrast images show any issues with a AVM or benign tumor such as meningioma that may light up with contrast. If they have further questions, they will call me back.

## 2014-05-09 NOTE — Telephone Encounter (Signed)
Patient's spouse questioning why patient is having CT with and w/o contrast.  Please call and advise

## 2014-05-09 NOTE — Telephone Encounter (Signed)
Mr. Bossier wife is calling wanting to know why the CT has to be done with contrast? You explained this on yesterday but she has more questions. Please call.

## 2014-05-10 ENCOUNTER — Telehealth: Payer: Self-pay | Admitting: Neurology

## 2014-05-10 DIAGNOSIS — R4701 Aphasia: Secondary | ICD-10-CM

## 2014-05-10 NOTE — Telephone Encounter (Signed)
Spoke with patient and wife and they are concerned about having the CT w/contrast because patient had a UTI in 09/14 and now has a problem with holding his urine, if he has to urinate,he can't hold it. They do not feel patient is physically able to go thru this again. Could speech therapy be done?

## 2014-05-10 NOTE — Telephone Encounter (Signed)
I called patient, talked with the wife. They do not want the head scan with contrast. I will reorder the test without. The patient wishes to engage in speech therapy for the aphasia. I will order this.

## 2014-05-10 NOTE — Telephone Encounter (Signed)
Patient's spouse calling back with additional questions for Dr. Jannifer Franklin.

## 2014-05-22 ENCOUNTER — Encounter: Payer: Self-pay | Admitting: Internal Medicine

## 2014-05-22 ENCOUNTER — Ambulatory Visit (INDEPENDENT_AMBULATORY_CARE_PROVIDER_SITE_OTHER): Payer: MEDICARE | Admitting: Internal Medicine

## 2014-05-22 VITALS — BP 144/82 | HR 57 | Temp 97.7°F | Ht 66.0 in | Wt 156.0 lb

## 2014-05-22 DIAGNOSIS — Z0000 Encounter for general adult medical examination without abnormal findings: Secondary | ICD-10-CM

## 2014-05-22 NOTE — Progress Notes (Signed)
Subjective:    Patient ID: Jack Noble, male    DOB: 29-Oct-1929, 80 y.o.   MRN: [id number]  HPI Medicare Wellness Visit: Psychosocial and medical history were reviewed as required by Medicare (history related to abuse, antisocial behavior , firearm risk). Social history: Caffeine: 1/2 mug/ day  , Alcohol:no  , Tobacco use:no Exercise:walking 30 min @ church 6 X/week Personal safety/fall risk:no Limitations of activities of daily living:no Seatbelt/ smoke alarm use:yes Healthcare Power of Attorney/Living Will status: UTD Ophthalmologic exam status: UTD Hearing evaluation status:not UTD Orientation: Oriented X 3 Memory and recall: none 1st try; 2 of 3 on 2nd try Math testing: excellent Depression/anxiety assessment: no Foreign travel history: [place] cruise Immunization status for influenza/pneumonia/ shingles /tetanus: Shingles needed Transfusion history: no Preventive health care maintenance status: Colonoscopy as per protocol/standard care:UTD Dental care:every 6 mos Chart reviewed and updated. Active issues reviewed and addressed as documented below.  Labs 08/04/13- 03/22/14 reviewed & discussed.    Review of Systems Seeing Dr Jack Noble for prostate related issues & Dr Jack Noble for aphasia.      Objective:   Physical Exam  Gen.: Healthy and well-nourished in appearance. Alert, appropriate and cooperative throughout exam. Appears younger than stated age  Head: Normocephalic without obvious abnormalities; head shaven.  Eyes: No corneal or conjunctival inflammation noted. Pupils equal round reactive to light and accommodation. Extraocular motion intact.Decreased superior elevation OD.Ears: External  ear exam reveals no significant lesions or deformities. Canals clear .TMs normal. Hearing is grossly normal bilaterally. Nose: External nasal exam reveals no deformity or inflammation. Nasal mucosa are pink and moist. No lesions or exudates noted.   Mouth: Oral mucosa and  oropharynx reveal no lesions or exudates. Teeth in good repair. Neck: No deformities, masses, or tenderness noted. Range of motion decreased. Thyroid normal. Lungs: Normal respiratory effort; chest expands symmetrically. Lungs are clear to auscultation without rales, wheezes, or increased work of breathing. Heart: Normal rate and rhythm. Normal S1 and S2. No gallop, click, or rub. No murmur. Abdomen: Bowel sounds normal; abdomen soft and nontender. No masses or  Organomegaly. Large ventral hernias noted. Genitalia: as per urology                                Musculoskeletal/extremities: No deformity or scoliosis noted of  the thoracic or lumbar spine.  No clubbing, cyanosis, edema, or significant extremity  deformity noted. Range of motion normal .Tone & strength normal. Hand joints normal Fingernail health good. Able to lie down & sit up w/o help. Negative SLR bilaterally Vascular: Carotid, radial artery, dorsalis pedis and  posterior tibial pulses are full and equal. No bruits present. Neurologic: Alert and oriented x3. Deep tendon reflexes symmetrical and normal. Some aphasia Gait normal .      Skin: Intact without suspicious lesions or rashes. Lymph: No cervical, axillary lymphadenopathy present. Psych: Mood and affect are normal. Normally interactive                                                                                        Assessment & Plan:  #  1 Medicare Wellness Exam; criteria met ; data entered #2 No active issues other than GU & speech which are being addressed by Urology & Neurology 

## 2014-05-22 NOTE — Progress Notes (Signed)
Pre visit review using our clinic review tool, if applicable. No additional management support is needed unless otherwise documented below in the visit note. 

## 2014-05-22 NOTE — Patient Instructions (Signed)
Followup as needed for your acute issue. Please report any significant change in your symptoms.

## 2014-05-23 HISTORY — PX: UPPER GI ENDOSCOPY: SHX6162

## 2014-05-28 ENCOUNTER — Ambulatory Visit: Payer: MEDICARE

## 2014-06-01 ENCOUNTER — Emergency Department (HOSPITAL_COMMUNITY): Payer: MEDICARE

## 2014-06-01 ENCOUNTER — Encounter (HOSPITAL_COMMUNITY): Payer: Self-pay | Admitting: Emergency Medicine

## 2014-06-01 ENCOUNTER — Inpatient Hospital Stay (HOSPITAL_COMMUNITY)
Admission: EM | Admit: 2014-06-01 | Discharge: 2014-06-03 | DRG: 378 | Disposition: A | Discharge status: Home / Self Care | Payer: MEDICARE | Attending: Internal Medicine | Admitting: Internal Medicine

## 2014-06-01 DIAGNOSIS — E785 Hyperlipidemia, unspecified: Secondary | ICD-10-CM | POA: Diagnosis present

## 2014-06-01 DIAGNOSIS — D649 Anemia, unspecified: Secondary | ICD-10-CM

## 2014-06-01 DIAGNOSIS — R6884 Jaw pain: Secondary | ICD-10-CM | POA: Diagnosis present

## 2014-06-01 DIAGNOSIS — I959 Hypotension, unspecified: Secondary | ICD-10-CM | POA: Diagnosis present

## 2014-06-01 DIAGNOSIS — D509 Iron deficiency anemia, unspecified: Secondary | ICD-10-CM | POA: Diagnosis not present

## 2014-06-01 DIAGNOSIS — Z87898 Personal history of other specified conditions: Secondary | ICD-10-CM

## 2014-06-01 DIAGNOSIS — I69920 Aphasia following unspecified cerebrovascular disease: Secondary | ICD-10-CM | POA: Diagnosis not present

## 2014-06-01 DIAGNOSIS — R42 Dizziness and giddiness: Secondary | ICD-10-CM | POA: Diagnosis not present

## 2014-06-01 DIAGNOSIS — H9190 Unspecified hearing loss, unspecified ear: Secondary | ICD-10-CM | POA: Diagnosis present

## 2014-06-01 DIAGNOSIS — R6520 Severe sepsis without septic shock: Secondary | ICD-10-CM

## 2014-06-01 DIAGNOSIS — K430 Incisional hernia with obstruction, without gangrene: Secondary | ICD-10-CM

## 2014-06-01 DIAGNOSIS — K922 Gastrointestinal hemorrhage, unspecified: Secondary | ICD-10-CM | POA: Diagnosis not present

## 2014-06-01 DIAGNOSIS — Z79899 Other long term (current) drug therapy: Secondary | ICD-10-CM | POA: Diagnosis not present

## 2014-06-01 DIAGNOSIS — E8809 Other disorders of plasma-protein metabolism, not elsewhere classified: Secondary | ICD-10-CM

## 2014-06-01 DIAGNOSIS — K5731 Diverticulosis of large intestine without perforation or abscess with bleeding: Secondary | ICD-10-CM | POA: Diagnosis present

## 2014-06-01 DIAGNOSIS — I10 Essential (primary) hypertension: Secondary | ICD-10-CM | POA: Diagnosis not present

## 2014-06-01 DIAGNOSIS — R7309 Other abnormal glucose: Secondary | ICD-10-CM

## 2014-06-01 DIAGNOSIS — D179 Benign lipomatous neoplasm, unspecified: Secondary | ICD-10-CM

## 2014-06-01 DIAGNOSIS — Z7982 Long term (current) use of aspirin: Secondary | ICD-10-CM

## 2014-06-01 DIAGNOSIS — IMO0002 Reserved for concepts with insufficient information to code with codable children: Secondary | ICD-10-CM

## 2014-06-01 DIAGNOSIS — R030 Elevated blood-pressure reading, without diagnosis of hypertension: Secondary | ICD-10-CM | POA: Diagnosis present

## 2014-06-01 DIAGNOSIS — K5730 Diverticulosis of large intestine without perforation or abscess without bleeding: Secondary | ICD-10-CM | POA: Diagnosis present

## 2014-06-01 DIAGNOSIS — K625 Hemorrhage of anus and rectum: Secondary | ICD-10-CM | POA: Diagnosis not present

## 2014-06-01 DIAGNOSIS — D62 Acute posthemorrhagic anemia: Secondary | ICD-10-CM | POA: Diagnosis not present

## 2014-06-01 DIAGNOSIS — Z86010 Personal history of colonic polyps: Secondary | ICD-10-CM

## 2014-06-01 DIAGNOSIS — Z823 Family history of stroke: Secondary | ICD-10-CM | POA: Diagnosis not present

## 2014-06-01 DIAGNOSIS — D72829 Elevated white blood cell count, unspecified: Secondary | ICD-10-CM

## 2014-06-01 DIAGNOSIS — H409 Unspecified glaucoma: Secondary | ICD-10-CM

## 2014-06-01 DIAGNOSIS — E162 Hypoglycemia, unspecified: Secondary | ICD-10-CM

## 2014-06-01 DIAGNOSIS — R4701 Aphasia: Secondary | ICD-10-CM

## 2014-06-01 DIAGNOSIS — N400 Enlarged prostate without lower urinary tract symptoms: Secondary | ICD-10-CM | POA: Diagnosis not present

## 2014-06-01 DIAGNOSIS — D696 Thrombocytopenia, unspecified: Secondary | ICD-10-CM

## 2014-06-01 DIAGNOSIS — A419 Sepsis, unspecified organism: Secondary | ICD-10-CM

## 2014-06-01 DIAGNOSIS — L719 Rosacea, unspecified: Secondary | ICD-10-CM | POA: Diagnosis present

## 2014-06-01 DIAGNOSIS — J309 Allergic rhinitis, unspecified: Secondary | ICD-10-CM

## 2014-06-01 LAB — COMPREHENSIVE METABOLIC PANEL
ALK PHOS: 42 U/L (ref 39–117)
ALT: 15 U/L (ref 0–53)
AST: 30 U/L (ref 0–37)
Albumin: 3.7 g/dL (ref 3.5–5.2)
Anion gap: 12 (ref 5–15)
BUN: 13 mg/dL (ref 6–23)
CHLORIDE: 103 meq/L (ref 96–112)
CO2: 25 mEq/L (ref 19–32)
Calcium: 9.5 mg/dL (ref 8.4–10.5)
Creatinine, Ser: 0.53 mg/dL (ref 0.50–1.35)
GFR calc non Af Amer: >90 mL/min (ref >90)
Glucose, Bld: 102 mg/dL — ABNORMAL HIGH (ref 70–99)
POTASSIUM: 4.3 meq/L (ref 3.7–5.3)
SODIUM: 140 meq/L (ref 137–147)
TOTAL PROTEIN: 6.9 g/dL (ref 6.0–8.3)
Total Bilirubin: 0.2 mg/dL — ABNORMAL LOW (ref 0.3–1.2)

## 2014-06-01 LAB — CBC WITH DIFFERENTIAL/PLATELET
Basophils Absolute: 0 10*3/uL (ref 0.0–0.1)
Basophils Relative: 0 % (ref 0–1)
Eosinophils Absolute: 0.1 10*3/uL (ref 0.0–0.7)
Eosinophils Relative: 1 % (ref 0–5)
HCT: 38.2 % — ABNORMAL LOW (ref 39.0–52.0)
Hemoglobin: 13.2 g/dL (ref 13.0–17.0)
LYMPHS PCT: 19 % (ref 12–46)
Lymphs Abs: 1.5 10*3/uL (ref 0.7–4.0)
MCH: 32.7 pg (ref 26.0–34.0)
MCHC: 34.6 g/dL (ref 30.0–36.0)
MCV: 94.6 fL (ref 78.0–100.0)
Monocytes Absolute: 0.6 10*3/uL (ref 0.1–1.0)
Monocytes Relative: 7 % (ref 3–12)
NEUTROS ABS: 5.7 10*3/uL (ref 1.7–7.7)
Neutrophils Relative %: 73 % (ref 43–77)
PLATELETS: 204 10*3/uL (ref 150–400)
RBC: 4.04 MIL/uL — AB (ref 4.22–5.81)
RDW: 12.7 % (ref 11.5–15.5)
WBC: 7.8 10*3/uL (ref 4.0–10.5)

## 2014-06-01 LAB — TYPE AND SCREEN
ABO/RH(D): A POS
Antibody Screen: NEGATIVE

## 2014-06-01 LAB — POC OCCULT BLOOD, ED: FECAL OCCULT BLD: POSITIVE — AB

## 2014-06-01 LAB — APTT: aPTT: 31 seconds (ref 24–37)

## 2014-06-01 LAB — HEMOGLOBIN AND HEMATOCRIT, BLOOD
HEMATOCRIT: 36.6 % — AB (ref 39.0–52.0)
HEMOGLOBIN: 12.7 g/dL — AB (ref 13.0–17.0)

## 2014-06-01 LAB — PROTIME-INR
INR: 0.99 (ref 0.00–1.49)
Prothrombin Time: 13.1 seconds (ref 11.6–15.2)

## 2014-06-01 MED ORDER — SODIUM CHLORIDE 0.9 % IV SOLN
1000.0000 mL | INTRAVENOUS | Status: DC
  Administered 2014-06-01 – 2014-06-03 (×3): 1000 mL via INTRAVENOUS

## 2014-06-01 MED ORDER — VITAMIN D 1000 UNITS PO TABS
1000.0000 [IU] | ORAL_TABLET | Freq: Every morning | ORAL | Status: DC
  Administered 2014-06-02 – 2014-06-03 (×2): 1000 [IU] via ORAL
  Filled 2014-06-01 (×2): qty 1

## 2014-06-01 MED ORDER — ADULT MULTIVITAMIN W/MINERALS CH
1.0000 | ORAL_TABLET | Freq: Every morning | ORAL | Status: DC
  Administered 2014-06-02 – 2014-06-03 (×2): 1 via ORAL
  Filled 2014-06-01 (×2): qty 1

## 2014-06-01 MED ORDER — TAMSULOSIN HCL 0.4 MG PO CAPS
0.4000 mg | ORAL_CAPSULE | Freq: Every day | ORAL | Status: DC
  Administered 2014-06-01: 0.4 mg via ORAL
  Filled 2014-06-01: qty 1

## 2014-06-01 MED ORDER — SODIUM CHLORIDE 0.9 % IV BOLUS (SEPSIS)
250.0000 mL | Freq: Once | INTRAVENOUS | Status: AC
  Administered 2014-06-01: 250 mL via INTRAVENOUS

## 2014-06-01 MED ORDER — TIMOLOL MALEATE (ONCE-DAILY) 0.5 % OP SOLN: 1.0000 [drp] | Freq: Two times a day (BID) | OPHTHALMIC | Status: DC

## 2014-06-01 MED ORDER — KETOCONAZOLE 2 % EX CREA: 1.0000 "application " | TOPICAL_CREAM | Freq: Two times a day (BID) | CUTANEOUS | Status: DC | PRN

## 2014-06-01 MED ORDER — HYDRALAZINE HCL 20 MG/ML IJ SOLN
10.0000 mg | INTRAMUSCULAR | Status: DC | PRN
  Administered 2014-06-01: 10 mg via INTRAVENOUS
  Filled 2014-06-01: qty 0.5

## 2014-06-01 MED ORDER — SODIUM CHLORIDE 0.9 % IJ SOLN
3.0000 mL | Freq: Two times a day (BID) | INTRAMUSCULAR | Status: DC
  Administered 2014-06-01 – 2014-06-03 (×2): 3 mL via INTRAVENOUS

## 2014-06-01 MED ORDER — ONDANSETRON HCL 4 MG/2ML IJ SOLN
4.0000 mg | Freq: Once | INTRAMUSCULAR | Status: DC
  Filled 2014-06-01: qty 2

## 2014-06-01 MED ORDER — TIMOLOL MALEATE 0.5 % OP SOLN
1.0000 [drp] | Freq: Two times a day (BID) | OPHTHALMIC | Status: DC
  Administered 2014-06-01 – 2014-06-03 (×4): 1 [drp] via OPHTHALMIC
  Filled 2014-06-01: qty 5

## 2014-06-01 MED ORDER — ONDANSETRON HCL 4 MG/2ML IJ SOLN
4.0000 mg | Freq: Four times a day (QID) | INTRAMUSCULAR | Status: DC | PRN
  Administered 2014-06-02: 4 mg via INTRAVENOUS
  Filled 2014-06-01: qty 2

## 2014-06-01 MED ORDER — NYSTATIN-TRIAMCINOLONE 100000-0.1 UNIT/GM-% EX CREA: 1.0000 "application " | TOPICAL_CREAM | Freq: Every day | CUTANEOUS | Status: DC | PRN

## 2014-06-01 MED ORDER — HYDRALAZINE HCL 20 MG/ML IJ SOLN
10.0000 mg | INTRAMUSCULAR | Status: DC | PRN
  Filled 2014-06-01 (×2): qty 0.5

## 2014-06-01 MED ORDER — SODIUM CHLORIDE 0.9 % IV SOLN
INTRAVENOUS | Status: DC
  Administered 2014-06-01: 21:00:00 via INTRAVENOUS

## 2014-06-01 NOTE — ED Notes (Signed)
Bed: [facility] Expected date:  Expected time:  Means of arrival:  Comments: EMS-diverticulitis

## 2014-06-01 NOTE — ED Notes (Signed)
Per EMS pt coming from home with c/o rectal bleeding that started this morning. Pt reports bright red blood, sts similar episode about 5 years ago.Denies any pain, dizziness, shortness of breath.

## 2014-06-01 NOTE — H&P (Signed)
[facility] History and Physical  Jack Noble ACZ:[id number] DOB: [birth year] DOA: 06/01/2014  Referring physician: EDP PCP: Jack Cobble, MD   Chief Complaint: BRBPR   HPI: Jack Noble is a 80 y.o. male h/o diverticular bleed several years ago, presents to ED after 3 episodes of BRBPR onset this afternoon.  Not on anticoagulants, no abdominal pain, no fever, no light-headedness.  Had colonoscopy in 2010 during the previous episode which showed pan-diverticulosis (no active bleeding at time of colonoscopy).  Review of Systems: Systems reviewed.  As above, otherwise negative  Past Medical History  Diagnosis Date  . Dermatophytosis of nail   . Hyperlipidemia   . Hearing loss   . Hyperplasia of prostate without urinary obstruction   . Cataracts, bilateral   . Rosacea     Dr. Marge Duncans  . Allergy   . Glaucoma     Dr. Edilia Bo  . Corneal abrasion     Dr. Ned Clines, [facility]  . Sepsis due to Escherichia coli (E. coli) 05/2011    Morganella also cultured  . Diverticulosis   . TIA (transient ischemic attack) 9/6-05/2012    BP 221/99  . Epistaxis   . Aphasia 12/26/2013    Dr Jannifer Franklin   Past Surgical History  Procedure Laterality Date  . Tonsillectomy and adenoidectomy    . Appendectomy      age 45  . Cataract extraction, bilateral  2003    Dr Ishmael Holter (now seen @ [place])  . Inguinal hernia repair      age 32  . Colonoscopy w/ polypectomy  06/2009    Dr.Jacobs; "miniscule polyp"  . Cholecystectomy  1978    stones   Social History:  reports that he has never smoked. He has never used smokeless tobacco. He reports that he does not drink alcohol or use illicit drugs.  Allergies  Allergen Reactions  . Amoxicillin     urticaria  . Codeine     Rash Because of a history of documented adverse serious drug reaction;Medi Alert bracelet  is recommended  . Minocycline Hcl     REACTION: nervous/chest pressure (Note: may have affected GE valve)  . Astelin [Azelastine  Hcl]     Makes me jittery  . Ether     pneumonia  . Zocor [Simvastatin] Other (See Comments)    Itching w/o, muscle problems and it affected his stomach  . Metoprolol     lightheaded    Family History  Problem Relation Age of Onset  . Stroke Mother 33  . Parkinsonism Brother     Brother died at age at age 60  . Pulmonary embolism Father 89    Post Op  . Diabetes Neg Hx   . Cancer Neg Hx   . COPD Neg Hx   . Heart disease Neg Hx      Prior to Admission medications   Medication Sig Start Date End Date Taking? Authorizing Provider  aspirin EC 81 MG tablet Take 81 mg by mouth every morning.    Yes Historical Provider, MD  Cholecalciferol (VITAMIN D) 1000 UNITS capsule Take 1,000 Units by mouth every morning.    Yes Historical Provider, MD  ketoconazole (NIZORAL) 2 % cream Apply 1 application topically 2 (two) times daily as needed for irritation. 05/07/14  Yes Hendricks Limes, MD  mometasone (ELOCON) 0.1 % ointment Apply 1 application topically 2 (two) times daily as needed (Applies to affected area on hands.).   Yes Historical Provider, MD  Multiple  Vitamin (MULTIVITAMIN WITH MINERALS) TABS Take 1 tablet by mouth every morning.    Yes Historical Provider, MD  nystatin-triamcinolone (MYCOLOG II) cream Apply 1 application topically daily as needed (Applies to red places in groin area.).   Yes Historical Provider, MD  tamsulosin (FLOMAX) 0.4 MG CAPS capsule Take 0.4 mg by mouth daily.   Yes Historical Provider, MD  Timolol Maleate (ISTALOL) 0.5 % (DAILY) SOLN Place 1 drop into the left eye 2 (two) times daily at 8 am and 10 pm.    Yes Historical Provider, MD   Physical Exam: Filed Vitals:   06/01/14 1741  BP: 191/88  Pulse: 77  Temp: 97.8 F (36.6 C)  Resp: 18    BP 191/88  Pulse 77  Temp(Src) 97.8 F (36.6 C)  Resp 18  SpO2 100%  General Appearance:    Alert, oriented, no distress, appears stated age  Head:    Normocephalic, atraumatic  Eyes:    PERRL, EOMI, sclera  non-icteric        Nose:   Nares without drainage or epistaxis. Mucosa, turbinates normal  Throat:   Moist mucous membranes. Oropharynx without erythema or exudate.  Neck:   Supple. No carotid bruits.  No thyromegaly.  No lymphadenopathy.   Back:     No CVA tenderness, no spinal tenderness  Lungs:     Clear to auscultation bilaterally, without wheezes, rhonchi or rales  Chest wall:    No tenderness to palpitation  Heart:    Regular rate and rhythm without murmurs, gallops, rubs  Abdomen:     Soft, non-tender, nondistended, normal bowel sounds, no organomegaly  Genitalia:    deferred  Rectal:    deferred  Extremities:   No clubbing, cyanosis or edema.  Pulses:   2+ and symmetric all extremities  Skin:   Skin color, texture, turgor normal, no rashes or lesions  Lymph nodes:   Cervical, supraclavicular, and axillary nodes normal  Neurologic:   CNII-XII intact. Normal strength, sensation and reflexes      throughout    Labs on Admission:  Basic Metabolic Panel:  Recent Labs Lab 06/01/14 1904  NA 140  K 4.3  CL 103  CO2 25  GLUCOSE 102*  BUN 13  CREATININE 0.53  CALCIUM 9.5   Liver Function Tests:  Recent Labs Lab 06/01/14 1904  AST 30  ALT 15  ALKPHOS 42  BILITOT 0.2*  PROT 6.9  ALBUMIN 3.7   No results found for this basename: LIPASE, AMYLASE,  in the last 168 hours No results found for this basename: AMMONIA,  in the last 168 hours CBC:  Recent Labs Lab 06/01/14 1904  WBC 7.8  NEUTROABS 5.7  HGB 13.2  HCT 38.2*  MCV 94.6  PLT 204   Cardiac Enzymes: No results found for this basename: CKTOTAL, CKMB, CKMBINDEX, TROPONINI,  in the last 168 hours  BNP (last 3 results) No results found for this basename: PROBNP,  in the last 8760 hours CBG: No results found for this basename: GLUCAP,  in the last 168 hours  Radiological Exams on Admission: Dg Chest Portable 1 View  06/01/2014   CLINICAL DATA:  Rectal bleeding.  Dizziness  EXAM: PORTABLE CHEST - 1 VIEW   COMPARISON:  07/24/2013  FINDINGS: Normal mediastinum and cardiac silhouette. Normal pulmonary vasculature. No evidence of effusion, infiltrate, or pneumothorax. No acute bony abnormality.  IMPRESSION: Normal chest radiograph.   Electronically Signed   By: Suzy Bouchard M.D.   On: 06/01/2014 18:53  EKG: Independently reviewed.  Assessment/Plan Principal Problem:   BRBPR (bright red blood per rectum) Active Problems:   DIVERTICULOSIS, COLON   Hypertension   1. BRBPR - most likely diverticular bleed given history 1. Clear liquid diet 2. IVF for gentle hydration 3. Repeat CBC in AM 4. Intake and output 5. Tele monitor 6. Call GI in AM re: colonoscopy 2. HTN - continue home meds, add hydralazine PRN    Code Status: Full Code  Family Communication: Wife at bedside Disposition Plan: Admit to obs   Time spent: 70 min  Lounell Schumacher M. [facility] Pager [phone]  If 7AM-7PM, please contact the day team taking care of the patient [email or web address] Password [email or web address] 06/01/2014, 8:25 PM

## 2014-06-01 NOTE — ED Provider Notes (Signed)
CSN: [id number]     Arrival date & time 06/01/14  20 History   First MD Initiated Contact with Patient 06/01/14 1757     Chief Complaint  Patient presents with  . Rectal Bleeding   HPI Comments: Pt did have several loose stools prior to the blood from hsi rectum.  Patient is a 80 y.o. male presenting with hematochezia. The history is provided by the patient.  Rectal Bleeding Quality:  Bright red Amount:  Moderate Duration:  3 hours Context: defecation   Similar prior episodes: yes   Associated symptoms: no abdominal pain, no fever and no light-headedness   Risk factors: no anticoagulant use   Pt denies any other complaints.  In the past he had gi bleeding associated with diverticulosis/diverticulitis.   Past Medical History  Diagnosis Date  . Dermatophytosis of nail   . Hyperlipidemia   . Hearing loss   . Hyperplasia of prostate without urinary obstruction   . Cataracts, bilateral   . Rosacea     Dr. Marge Duncans  . Allergy   . Glaucoma     Dr. Edilia Bo  . Corneal abrasion     Dr. Ned Clines, [facility]  . Sepsis due to Escherichia coli (E. coli) 05/2011    Morganella also cultured  . Diverticulosis   . TIA (transient ischemic attack) 9/6-05/2012    BP 221/99  . Epistaxis   . Aphasia 12/26/2013    Dr Jannifer Franklin   Past Surgical History  Procedure Laterality Date  . Tonsillectomy and adenoidectomy    . Appendectomy      age 51  . Cataract extraction, bilateral  2003    Dr Ishmael Holter (now seen @ [place])  . Inguinal hernia repair      age 44  . Colonoscopy w/ polypectomy  06/2009    Dr.Jacobs; "miniscule polyp"  . Cholecystectomy  1978    stones   Family History  Problem Relation Age of Onset  . Stroke Mother 10  . Parkinsonism Brother     Brother died at age at age 95  . Pulmonary embolism Father 94    Post Op  . Diabetes Neg Hx   . Cancer Neg Hx   . COPD Neg Hx   . Heart disease Neg Hx    History  Substance Use Topics  . Smoking status: Never Smoker   . Smokeless  tobacco: Never Used  . Alcohol Use: No    Review of Systems  Constitutional: Negative for fever.  Gastrointestinal: Positive for hematochezia. Negative for abdominal pain.  Neurological: Negative for light-headedness.  All other systems reviewed and are negative.     Allergies  Amoxicillin; Codeine; Minocycline hcl; Astelin; Ether; Zocor; and Metoprolol  Home Medications   Prior to Admission medications   Medication Sig Start Date End Date Taking? Authorizing Provider  aspirin EC 81 MG tablet Take 81 mg by mouth every morning.    Yes Historical Provider, MD  Cholecalciferol (VITAMIN D) 1000 UNITS capsule Take 1,000 Units by mouth every morning.    Yes Historical Provider, MD  ketoconazole (NIZORAL) 2 % cream Apply 1 application topically 2 (two) times daily as needed for irritation. 05/07/14  Yes Hendricks Limes, MD  mometasone (ELOCON) 0.1 % ointment Apply 1 application topically 2 (two) times daily as needed (Applies to affected area on hands.).   Yes Historical Provider, MD  Multiple Vitamin (MULTIVITAMIN WITH MINERALS) TABS Take 1 tablet by mouth every morning.    Yes Historical Provider, MD  nystatin-triamcinolone (MYCOLOG II) cream Apply 1 application topically daily as needed (Applies to red places in groin area.).   Yes Historical Provider, MD  tamsulosin (FLOMAX) 0.4 MG CAPS capsule Take 0.4 mg by mouth daily.   Yes Historical Provider, MD  Timolol Maleate (ISTALOL) 0.5 % (DAILY) SOLN Place 1 drop into the left eye 2 (two) times daily at 8 am and 10 pm.    Yes Historical Provider, MD   BP 191/88  Pulse 77  Temp(Src) 97.8 F (36.6 C)  Resp 18  SpO2 100% Physical Exam  Nursing note and vitals reviewed. Constitutional: He appears well-developed and well-nourished. No distress.  HENT:  Head: Normocephalic and atraumatic.  Right Ear: External ear normal.  Left Ear: External ear normal.  Eyes: Conjunctivae are normal. Right eye exhibits no discharge. Left eye exhibits no  discharge. No scleral icterus.  Neck: Neck supple. No tracheal deviation present.  Cardiovascular: Normal rate, regular rhythm and intact distal pulses.   Pulmonary/Chest: Effort normal and breath sounds normal. No stridor. No respiratory distress. He has no wheezes. He has no rales.  Abdominal: Soft. Bowel sounds are normal. He exhibits no distension. There is no tenderness. There is no rebound and no guarding.  Genitourinary: Guaiac positive stool.  Dark blood in the rectum  Musculoskeletal: He exhibits no edema and no tenderness.  Neurological: He is alert. He has normal strength. No cranial nerve deficit (no facial droop, extraocular movements intact, no slurred speech) or sensory deficit. He exhibits normal muscle tone. He displays no seizure activity. Coordination normal.  Skin: Skin is warm and dry. No rash noted.  Psychiatric: He has a normal mood and affect.    ED Course  Procedures (including critical care time) Labs Review Labs Reviewed  CBC WITH DIFFERENTIAL - Abnormal; Notable for the following:    RBC 4.04 (*)    HCT 38.2 (*)    All other components within normal limits  COMPREHENSIVE METABOLIC PANEL - Abnormal; Notable for the following:    Glucose, Bld 102 (*)    Total Bilirubin 0.2 (*)    All other components within normal limits  POC OCCULT BLOOD, ED - Abnormal; Notable for the following:    Fecal Occult Bld POSITIVE (*)    All other components within normal limits  APTT  PROTIME-INR  TYPE AND SCREEN    Imaging Review Dg Chest Portable 1 View  06/01/2014   CLINICAL DATA:  Rectal bleeding.  Dizziness  EXAM: PORTABLE CHEST - 1 VIEW  COMPARISON:  07/24/2013  FINDINGS: Normal mediastinum and cardiac silhouette. Normal pulmonary vasculature. No evidence of effusion, infiltrate, or pneumothorax. No acute bony abnormality.  IMPRESSION: Normal chest radiograph.   Electronically Signed   By: Suzy Bouchard M.D.   On: 06/01/2014 18:53    MDM   Final diagnoses:   Gastrointestinal hemorrhage, unspecified gastritis, unspecified gastrointestinal hemorrhage type    Pt is having bright red blood per rectum.  Hgb is stable and his vitals are stable.  With his age and the rectal bleeding however will admit for monitoring, serial hgb.   Dorie Rank, MD 06/01/14 2004

## 2014-06-02 DIAGNOSIS — K5730 Diverticulosis of large intestine without perforation or abscess without bleeding: Secondary | ICD-10-CM | POA: Diagnosis not present

## 2014-06-02 DIAGNOSIS — D509 Iron deficiency anemia, unspecified: Secondary | ICD-10-CM | POA: Diagnosis not present

## 2014-06-02 DIAGNOSIS — K625 Hemorrhage of anus and rectum: Secondary | ICD-10-CM | POA: Diagnosis not present

## 2014-06-02 DIAGNOSIS — D62 Acute posthemorrhagic anemia: Secondary | ICD-10-CM

## 2014-06-02 DIAGNOSIS — N400 Enlarged prostate without lower urinary tract symptoms: Secondary | ICD-10-CM | POA: Diagnosis not present

## 2014-06-02 DIAGNOSIS — K922 Gastrointestinal hemorrhage, unspecified: Secondary | ICD-10-CM | POA: Diagnosis not present

## 2014-06-02 LAB — BASIC METABOLIC PANEL
Anion gap: 10 (ref 5–15)
BUN: 15 mg/dL (ref 6–23)
CALCIUM: 8.4 mg/dL (ref 8.4–10.5)
CO2: 24 mEq/L (ref 19–32)
Chloride: 109 mEq/L (ref 96–112)
Creatinine, Ser: 0.62 mg/dL (ref 0.50–1.35)
GFR, EST NON AFRICAN AMERICAN: 89 mL/min — AB (ref >90)
Glucose, Bld: 132 mg/dL — ABNORMAL HIGH (ref 70–99)
POTASSIUM: 4.4 meq/L (ref 3.7–5.3)
SODIUM: 143 meq/L (ref 137–147)

## 2014-06-02 LAB — URINALYSIS, ROUTINE W REFLEX MICROSCOPIC
BILIRUBIN URINE: NEGATIVE
Glucose, UA: NEGATIVE mg/dL
Hgb urine dipstick: NEGATIVE
KETONES UR: NEGATIVE mg/dL
LEUKOCYTES UA: NEGATIVE
Nitrite: NEGATIVE
PH: 6 (ref 5.0–8.0)
PROTEIN: NEGATIVE mg/dL
Specific Gravity, Urine: 1.016 (ref 1.005–1.030)
Urobilinogen, UA: 0.2 mg/dL (ref 0.0–1.0)

## 2014-06-02 LAB — MRSA PCR SCREENING: MRSA by PCR: NEGATIVE

## 2014-06-02 LAB — CBC
HCT: 30.7 % — ABNORMAL LOW (ref 39.0–52.0)
HEMOGLOBIN: 10.6 g/dL — AB (ref 13.0–17.0)
MCH: 32.6 pg (ref 26.0–34.0)
MCHC: 34.5 g/dL (ref 30.0–36.0)
MCV: 94.5 fL (ref 78.0–100.0)
PLATELETS: 163 10*3/uL (ref 150–400)
RBC: 3.25 MIL/uL — ABNORMAL LOW (ref 4.22–5.81)
RDW: 12.9 % (ref 11.5–15.5)
WBC: 7.1 10*3/uL (ref 4.0–10.5)

## 2014-06-02 LAB — HEMOGLOBIN AND HEMATOCRIT, BLOOD
HCT: 29 % — ABNORMAL LOW (ref 39.0–52.0)
HCT: 31.6 % — ABNORMAL LOW (ref 39.0–52.0)
Hemoglobin: 10.1 g/dL — ABNORMAL LOW (ref 13.0–17.0)
Hemoglobin: 10.8 g/dL — ABNORMAL LOW (ref 13.0–17.0)

## 2014-06-02 LAB — TROPONIN I: Troponin I: <0.3 ng/mL (ref <0.30)

## 2014-06-02 MED ORDER — TAMSULOSIN HCL 0.4 MG PO CAPS
0.4000 mg | ORAL_CAPSULE | Freq: Every day | ORAL | Status: DC
  Administered 2014-06-02: 0.4 mg via ORAL
  Filled 2014-06-02: qty 1

## 2014-06-02 MED ORDER — SODIUM CHLORIDE 0.9 % IV SOLN
INTRAVENOUS | Status: DC
  Administered 2014-06-02: 125 mL/h via INTRAVENOUS

## 2014-06-02 NOTE — Consult Note (Signed)
Cross cover-[facility]-GI Reason for Consult: Painless rectal bleeding. Referring Physician: THP.  Jack Noble is an 80 y.o. male.  HPI: 80 year old white male, with multiple medical problems listed below, presented to the [facility] by EMS after he was near syncopal after several bloody BM's yesterday. He has a history of diverticulosis and colonic polyps. His last colonoscopy was in 2010. He has been doing fairly well since admission and has had no further hematochezia since admission. He denies having any abdominal pain, nausea or vomiting. His wife usually answers most of the questions as he has been aphasic since a "stroke" in 07/2013.   Past Medical History  Diagnosis Date  . Dermatophytosis of nail   . Hyperlipidemia   . Hearing loss   . Hyperplasia of prostate without urinary obstruction   . Cataracts, bilateral   . Rosacea     Dr. Marge Duncans  . Allergy   . Glaucoma     Dr. Edilia Bo  . Corneal abrasion     Dr. Ned Clines, [facility]  . Sepsis due to Escherichia coli (E. coli) 05/2011    Morganella also cultured  . Diverticulosis   . TIA (transient ischemic attack) 9/6-05/2012    BP 221/99  . Epistaxis   . Aphasia 12/26/2013    Dr Jannifer Franklin   Past Surgical History  Procedure Laterality Date  . Tonsillectomy and adenoidectomy    . Appendectomy      age 44  . Cataract extraction, bilateral  2003    Dr Ishmael Holter (now seen @ [place])  . Inguinal hernia repair      age 53  . Colonoscopy w/ polypectomy  06/2009    Dr.Jacobs; "miniscule polyp"  . Cholecystectomy  1978    stones   Family History  Problem Relation Age of Onset  . Stroke Mother 59  . Parkinsonism Brother     Brother died at age at age 35  . Pulmonary embolism Father 80    Post Op  . Diabetes Neg Hx   . Cancer Neg Hx   . COPD Neg Hx   . Heart disease Neg Hx    Social History:  reports that he has never smoked. He has never used smokeless tobacco. He reports that he does not drink alcohol or use illicit  drugs.  Allergies:  Allergies  Allergen Reactions  . Amoxicillin     urticaria  . Codeine     Rash Because of a history of documented adverse serious drug reaction;Medi Alert bracelet  is recommended  . Minocycline Hcl     REACTION: nervous/chest pressure (Note: may have affected GE valve)  . Astelin [Azelastine Hcl]     Makes me jittery  . Ether     pneumonia  . Zocor [Simvastatin] Other (See Comments)    Itching w/o, muscle problems and it affected his stomach  . Metoprolol     lightheaded   Medications: I have reviewed the patient's current medications.  Results for orders placed during the hospital encounter of 06/01/14 (from the past 48 hour(s))  TYPE AND SCREEN     Status: None   Collection Time    06/01/14  6:27 PM      Result Value Ref Range   ABO/RH(D) A POS     Antibody Screen NEG     Sample Expiration 06/04/2014    POC OCCULT BLOOD, ED     Status: Abnormal   Collection Time    06/01/14  6:38 PM  Result Value Ref Range   Fecal Occult Bld POSITIVE (*) NEGATIVE  CBC WITH DIFFERENTIAL     Status: Abnormal   Collection Time    06/01/14  7:04 PM      Result Value Ref Range   WBC 7.8  4.0 - 10.5 K/uL   RBC 4.04 (*) 4.22 - 5.81 MIL/uL   Hemoglobin 13.2  13.0 - 17.0 g/dL   HCT 38.2 (*) 39.0 - 52.0 %   MCV 94.6  78.0 - 100.0 fL   MCH 32.7  26.0 - 34.0 pg   MCHC 34.6  30.0 - 36.0 g/dL   RDW 12.7  11.5 - 15.5 %   Platelets 204  150 - 400 K/uL   Neutrophils Relative % 73  43 - 77 %   Neutro Abs 5.7  1.7 - 7.7 K/uL   Lymphocytes Relative 19  12 - 46 %   Lymphs Abs 1.5  0.7 - 4.0 K/uL   Monocytes Relative 7  3 - 12 %   Monocytes Absolute 0.6  0.1 - 1.0 K/uL   Eosinophils Relative 1  0 - 5 %   Eosinophils Absolute 0.1  0.0 - 0.7 K/uL   Basophils Relative 0  0 - 1 %   Basophils Absolute 0.0  0.0 - 0.1 K/uL  COMPREHENSIVE METABOLIC PANEL     Status: Abnormal   Collection Time    06/01/14  7:04 PM      Result Value Ref Range   Sodium 140  137 - 147 mEq/L    Potassium 4.3  3.7 - 5.3 mEq/L   Comment: SLIGHT HEMOLYSIS     HEMOLYSIS AT THIS LEVEL MAY AFFECT RESULT   Chloride 103  96 - 112 mEq/L   CO2 25  19 - 32 mEq/L   Glucose, Bld 102 (*) 70 - 99 mg/dL   BUN 13  6 - 23 mg/dL   Creatinine, Ser 0.53  0.50 - 1.35 mg/dL   Calcium 9.5  8.4 - 10.5 mg/dL   Total Protein 6.9  6.0 - 8.3 g/dL   Albumin 3.7  3.5 - 5.2 g/dL   AST 30  0 - 37 U/L   Comment: SLIGHT HEMOLYSIS     HEMOLYSIS AT THIS LEVEL MAY AFFECT RESULT   ALT 15  0 - 53 U/L   Comment: SLIGHT HEMOLYSIS     HEMOLYSIS AT THIS LEVEL MAY AFFECT RESULT   Alkaline Phosphatase 42  39 - 117 U/L   Total Bilirubin 0.2 (*) 0.3 - 1.2 mg/dL   GFR calc non Af Amer >90  >90 mL/min   GFR calc Af Amer >90  >90 mL/min   Comment: (NOTE)     The eGFR has been calculated using the CKD EPI equation.     This calculation has not been validated in all clinical situations.     eGFR's persistently <90 mL/min signify possible Chronic Kidney     Disease.   Anion gap 12  5 - 15  APTT     Status: None   Collection Time    06/01/14  7:04 PM      Result Value Ref Range   aPTT 31  24 - 37 seconds  PROTIME-INR     Status: None   Collection Time    06/01/14  7:04 PM      Result Value Ref Range   Prothrombin Time 13.1  11.6 - 15.2 seconds   INR 0.99  0.00 - 1.49  HEMOGLOBIN AND HEMATOCRIT,  BLOOD     Status: Abnormal   Collection Time    06/01/14 10:55 PM      Result Value Ref Range   Hemoglobin 12.7 (*) 13.0 - 17.0 g/dL   HCT 36.6 (*) 39.0 - 52.0 %  MRSA PCR SCREENING     Status: None   Collection Time    06/01/14 11:51 PM      Result Value Ref Range   MRSA by PCR NEGATIVE  NEGATIVE   Comment:            The GeneXpert MRSA Assay (FDA     approved for NASAL specimens     only), is one component of a     comprehensive MRSA colonization     surveillance program. It is not     intended to diagnose MRSA     infection nor to guide or     monitor treatment for     MRSA infections.  TROPONIN I     Status:  None   Collection Time    06/02/14 12:10 AM      Result Value Ref Range   Troponin I <0.30  <0.30 ng/mL   Comment:            Due to the release kinetics of cTnI,     a negative result within the first hours     of the onset of symptoms does not rule out     myocardial infarction with certainty.     If myocardial infarction is still suspected,     repeat the test at appropriate intervals.  CBC     Status: Abnormal   Collection Time    06/02/14  6:20 AM      Result Value Ref Range   WBC 7.1  4.0 - 10.5 K/uL   RBC 3.25 (*) 4.22 - 5.81 MIL/uL   Hemoglobin 10.6 (*) 13.0 - 17.0 g/dL   HCT 30.7 (*) 39.0 - 52.0 %   MCV 94.5  78.0 - 100.0 fL   MCH 32.6  26.0 - 34.0 pg   MCHC 34.5  30.0 - 36.0 g/dL   RDW 12.9  11.5 - 15.5 %   Platelets 163  150 - 400 K/uL  BASIC METABOLIC PANEL     Status: Abnormal   Collection Time    06/02/14  6:20 AM      Result Value Ref Range   Sodium 143  137 - 147 mEq/L   Potassium 4.4  3.7 - 5.3 mEq/L   Chloride 109  96 - 112 mEq/L   CO2 24  19 - 32 mEq/L   Glucose, Bld 132 (*) 70 - 99 mg/dL   BUN 15  6 - 23 mg/dL   Creatinine, Ser 0.62  0.50 - 1.35 mg/dL   Calcium 8.4  8.4 - 10.5 mg/dL   GFR calc non Af Amer 89 (*) >90 mL/min   GFR calc Af Amer >90  >90 mL/min   Comment: (NOTE)     The eGFR has been calculated using the CKD EPI equation.     This calculation has not been validated in all clinical situations.     eGFR's persistently <90 mL/min signify possible Chronic Kidney     Disease.   Anion gap 10  5 - 15  TROPONIN I     Status: None   Collection Time    06/02/14  6:20 AM      Result Value Ref Range   Troponin I <0.30  <  0.30 ng/mL   Comment:            Due to the release kinetics of cTnI,     a negative result within the first hours     of the onset of symptoms does not rule out     myocardial infarction with certainty.     If myocardial infarction is still suspected,     repeat the test at appropriate intervals.  URINALYSIS, ROUTINE W  REFLEX MICROSCOPIC     Status: None   Collection Time    06/02/14  9:46 AM      Result Value Ref Range   Color, Urine YELLOW  YELLOW   APPearance CLEAR  CLEAR   Specific Gravity, Urine 1.016  1.005 - 1.030   pH 6.0  5.0 - 8.0   Glucose, UA NEGATIVE  NEGATIVE mg/dL   Hgb urine dipstick NEGATIVE  NEGATIVE   Bilirubin Urine NEGATIVE  NEGATIVE   Ketones, ur NEGATIVE  NEGATIVE mg/dL   Protein, ur NEGATIVE  NEGATIVE mg/dL   Urobilinogen, UA 0.2  0.0 - 1.0 mg/dL   Nitrite NEGATIVE  NEGATIVE   Leukocytes, UA NEGATIVE  NEGATIVE   Comment: MICROSCOPIC NOT DONE ON URINES WITH NEGATIVE PROTEIN, BLOOD, LEUKOCYTES, NITRITE, OR GLUCOSE <1000 mg/dL.  TROPONIN I     Status: None   Collection Time    06/02/14 12:08 PM      Result Value Ref Range   Troponin I <0.30  <0.30 ng/mL   Comment:            Due to the release kinetics of cTnI,     a negative result within the first hours     of the onset of symptoms does not rule out     myocardial infarction with certainty.     If myocardial infarction is still suspected,     repeat the test at appropriate intervals.  HEMOGLOBIN AND HEMATOCRIT, BLOOD     Status: Abnormal   Collection Time    06/02/14 12:09 PM      Result Value Ref Range   Hemoglobin 10.1 (*) 13.0 - 17.0 g/dL   HCT 29.0 (*) 39.0 - 52.0 %   Dg Chest Portable 1 View  06/01/2014   CLINICAL DATA:  Rectal bleeding.  Dizziness  EXAM: PORTABLE CHEST - 1 VIEW  COMPARISON:  07/24/2013  FINDINGS: Normal mediastinum and cardiac silhouette. Normal pulmonary vasculature. No evidence of effusion, infiltrate, or pneumothorax. No acute bony abnormality.  IMPRESSION: Normal chest radiograph.   Electronically Signed   By: Suzy Bouchard M.D.   On: 06/01/2014 18:53   Review of Systems  Constitutional: Negative for weight loss.  HENT: Negative.   Respiratory: Negative.   Cardiovascular: Negative.   Gastrointestinal: Positive for blood in stool. Negative for heartburn, nausea, vomiting, abdominal  pain, diarrhea and constipation.  Skin: Negative.   Neurological: Positive for weakness.  Endo/Heme/Allergies: Negative.   Psychiatric/Behavioral: Positive for memory loss.   Blood pressure 131/65, pulse 84, temperature 98 F (36.7 C), temperature source Oral, resp. rate 24, height '5\' 6"'  (1.676 m), weight 69.1 kg (152 lb 5.4 oz), SpO2 96.00%. Physical Exam  Constitutional: He is oriented to person, place, and time. He appears well-developed and well-nourished.  HENT:  Head: Normocephalic and atraumatic.  Eyes: Conjunctivae and EOM are normal. Pupils are equal, round, and reactive to light.  Neck: Normal range of motion. Neck supple.  Cardiovascular: Normal rate and regular rhythm.   Respiratory: Effort normal and breath  sounds normal.  GI: Soft. Bowel sounds are normal.  Musculoskeletal: Normal range of motion.  Neurological: He is alert and oriented to person, place, and time.  Skin: Skin is warm and dry.  Psychiatric: He has a normal mood and affect. His behavior is normal. Judgment and thought content normal.   Assessment/Plan: 1) Rectal bleeding-presumed to be due to diverticular bleed. Continue present care. We will care for now. Allow patient clears for now.  2) Personal history of colonic polyps.  3) HTN. 4) Aphasia; history of TIA vs stroke.   Trynity Skousen 06/02/2014, 2:52 PM

## 2014-06-02 NOTE — Progress Notes (Signed)
[facility] PROGRESS NOTE  Marquelle Musgrave YPP:[id number] DOB: 02/28/1929 DOA: 06/01/2014 PCP: Unice Cobble, MD  Assessment/Plan: 80 y/o male with PMH of HPL, BPH, diverticula bleeding presented with sudden intermittent rectal bleeding since 7/10  1. Acute GIB suspected diverticula bleeding; BUN -15;  -colonoscopy (2010): Pan-diverticulosis -NPO, IVF; TF prn; bleeding seems to decrease; overnight consulted GI for colonoscopy eval; hold ASA  2. Acute blood loss anemia due to GIB;  -Hg 13.2-->10.6; but no new bleeding episodes; monitor Hg; TF as need;  3. BPH, cont flomax;;check UA, bladder scan 4. Episode of hypotension, syncope overnight likely vasovagal with BM on top of hydralazine;  -no new symptoms; neuro exam no focal; cont monitoring    Code Status: full Family Communication:  D/w patient, his wife  (indicate person spoken with, relationship, and if by phone, the number) Disposition Plan: home pend clinical improvement    Consultants:  GI  Procedures:  none  Antibiotics:  none (indicate start date, and stop date if known)  HPI/Subjective: alert  Objective: Filed Vitals:   06/02/14 0700  BP: 116/53  Pulse: 92  Temp:   Resp: 22    Intake/Output Summary (Last 24 hours) at 06/02/14 0910 Last data filed at 06/02/14 0600  Gross per 24 hour  Intake      0 ml  Output    803 ml  Net   -803 ml   Filed Weights   06/01/14 2142 06/02/14 0000  Weight: 67.223 kg (148 lb 3.2 oz) 69.1 kg (152 lb 5.4 oz)    Exam:   General:  alert  Cardiovascular: s1,s2 rrr  Respiratory: CTA BL  Abdomen: soft, nt,nd   Musculoskeletal: no LE edema   Data Reviewed: Basic Metabolic Panel:  Recent Labs Lab 06/01/14 1904 06/02/14 0620  NA 140 143  K 4.3 4.4  CL 103 109  CO2 25 24  GLUCOSE 102* 132*  BUN 13 15  CREATININE 0.53 0.62  CALCIUM 9.5 8.4   Liver Function Tests:  Recent Labs Lab 06/01/14 1904  AST 30  ALT 15  ALKPHOS 42  BILITOT 0.2*   PROT 6.9  ALBUMIN 3.7   No results found for this basename: LIPASE, AMYLASE,  in the last 168 hours No results found for this basename: AMMONIA,  in the last 168 hours CBC:  Recent Labs Lab 06/01/14 1904 06/01/14 2255 06/02/14 0620  WBC 7.8  --  7.1  NEUTROABS 5.7  --   --   HGB 13.2 12.7* 10.6*  HCT 38.2* 36.6* 30.7*  MCV 94.6  --  94.5  PLT 204  --  163   Cardiac Enzymes:  Recent Labs Lab 06/02/14 0010 06/02/14 0620  TROPONINI <0.30 <0.30   BNP (last 3 results) No results found for this basename: PROBNP,  in the last 8760 hours CBG: No results found for this basename: GLUCAP,  in the last 168 hours  Recent Results (from the past 240 hour(s))  MRSA PCR SCREENING     Status: None   Collection Time    06/01/14 11:51 PM      Result Value Ref Range Status   MRSA by PCR NEGATIVE  NEGATIVE Final   Comment:            The GeneXpert MRSA Assay (FDA     approved for NASAL specimens     only), is one component of a     comprehensive MRSA colonization     surveillance program. It is not  intended to diagnose MRSA     infection nor to guide or     monitor treatment for     MRSA infections.     Studies: Dg Chest Portable 1 View  06/01/2014   CLINICAL DATA:  Rectal bleeding.  Dizziness  EXAM: PORTABLE CHEST - 1 VIEW  COMPARISON:  07/24/2013  FINDINGS: Normal mediastinum and cardiac silhouette. Normal pulmonary vasculature. No evidence of effusion, infiltrate, or pneumothorax. No acute bony abnormality.  IMPRESSION: Normal chest radiograph.   Electronically Signed   By: Suzy Bouchard M.D.   On: 06/01/2014 18:53    Scheduled Meds: . cholecalciferol  1,000 Units Oral q morning - 10a  . multivitamin with minerals  1 tablet Oral q morning - 10a  . ondansetron (ZOFRAN) IV  4 mg Intravenous Once  . sodium chloride  3 mL Intravenous Q12H  . tamsulosin  0.4 mg Oral Daily  . timolol  1 drop Left Eye BID   Continuous Infusions: . sodium chloride 1,000 mL (06/01/14  2001)  . sodium chloride 75 mL/hr at 06/01/14 2054  . sodium chloride 125 mL/hr (06/02/14 0839)    Principal Problem:   BRBPR (bright red blood per rectum) Active Problems:   DIVERTICULOSIS, COLON   Hypertension    Time spent: >35 minutes     Kinnie Feil  [facility] Pager [phone]. If 7PM-7AM, please contact night-coverage at [email or web address], password [facility] 06/02/2014, 9:10 AM  LOS: 1 day

## 2014-06-02 NOTE — Progress Notes (Addendum)
[facility] progress note. Chief complaint. Continue to treat red blood per rectum, hypotension, jaw pain. History of present illness. This 80 year old male admitted with presumed diverticular bleed. Admitting hemoglobin was 13.2. He was transferred to the floor with plan to contact gastroenterology in the a.m. Since arrival on the floor patient has had at least 3 fairly large bloody bowel movements. While in the bathroom the patient became weak and dizzy and was moved back to bed. An initial systolic blood pressure was reported in the 60s. Requested rapid response he called and I came to see the patient at bedside. Repeat systolic blood pressure with the patient in bed was in the 80s. Initiated a normal saline bolus of 1 L fluids over one hour. Patient denied abdominal pain. He denied chest pain but did state he was having jaw pain. He denied dyspnea but did indicate he felt nauseous. There is also degree of diaphoresis. Given that the patient's unstable condition we have moved him to ICU for closer monitoring. Her peak stat hemoglobin was done and this resulted 12.7. Vital signs. Temperature 97.3, pulse 86, respiration 22, blood pressure 112/63. O2 sats 100%. General appearance. Well-developed elderly male who is alert and in no distress. Cardiac. Rate and rhythm regular. Lungs. Breath sounds clinical. Abdomen. Soft with positive bowel sounds. No pain with palpation. Impression/plan. Problem #1. Hypotension. I suspect this multifactorial due to prior administration of hydralazine for hypertension, volume deficit do to lower GI bleed, and possible vasovagal effect during bowel movement. Patient's blood pressure now stable status post fluid bolus. Nursing will monitor closely and update me if hypotension recurs. I did discontinue the when necessary hydralazine. Problem #2. Anemia secondary to lower GI bleed. We'll follow hemoglobin and hematocrit every 6 hours. Transfuse if necessary. A type and  screen is already in place.  The patient was previously seen by a with [facility]. I discussed the case with Doctor Collene Mares on-call gastroenterologist. She did not feel that anything can be done from a GI perspective at this time given unprepped bowel. Agrees with close monitoring of hemoglobin. Will see patient in a.m. If of bleeding persists and hemoglobin keeps dropping appreciably she recommends consideration of a bleeding scan by nuclear medicine. Problem #3. Jaw pain. Given jaw pain, nausea, and diaphoresis I thought this may represent cardiac ischemia. 12-lead EKG was obtained and shows normal sinus rhythm without indication of acute ischemia. Nonetheless we'll obtain troponin now and then every 6 hours for a total of 3 sets and follow for results.

## 2014-06-02 NOTE — Significant Event (Signed)
Rapid Response Event Note  Overview: At about 2330 on 06/01/14, Pt's Nurse called Rapid response nurse to room 1418, reported Pt having low Blood pressure, and bloody stools.      Initial Focused Assessment: pt alert and oriented x 4, denied any pain, placed on cardiac monitor BP 102/53, HR 71, RR 30, 98% on room air.   Interventions: [facility] floor coverage at bedside, order for 1L NS bolus was infusing start prior to the arrival of the rapid response Nurse.   Event Summary: Order was received to transfer pt to [facility] unit, pt transported  via bed to room 1231.   at      at          [facility], [place]

## 2014-06-03 DIAGNOSIS — D509 Iron deficiency anemia, unspecified: Secondary | ICD-10-CM | POA: Diagnosis not present

## 2014-06-03 DIAGNOSIS — K922 Gastrointestinal hemorrhage, unspecified: Secondary | ICD-10-CM | POA: Diagnosis not present

## 2014-06-03 DIAGNOSIS — K5730 Diverticulosis of large intestine without perforation or abscess without bleeding: Secondary | ICD-10-CM | POA: Diagnosis not present

## 2014-06-03 DIAGNOSIS — N400 Enlarged prostate without lower urinary tract symptoms: Secondary | ICD-10-CM | POA: Diagnosis not present

## 2014-06-03 DIAGNOSIS — D62 Acute posthemorrhagic anemia: Secondary | ICD-10-CM | POA: Diagnosis not present

## 2014-06-03 LAB — HEMOGLOBIN AND HEMATOCRIT, BLOOD
HCT: 31 % — ABNORMAL LOW (ref 39.0–52.0)
HCT: 29.7 % — ABNORMAL LOW (ref 39.0–52.0)
HEMATOCRIT: 27.1 % — AB (ref 39.0–52.0)
HEMOGLOBIN: 10.1 g/dL — AB (ref 13.0–17.0)
Hemoglobin: 10.5 g/dL — ABNORMAL LOW (ref 13.0–17.0)
Hemoglobin: 9.4 g/dL — ABNORMAL LOW (ref 13.0–17.0)

## 2014-06-03 MED ORDER — SIMETHICONE 80 MG PO CHEW
80.0000 mg | CHEWABLE_TABLET | Freq: Four times a day (QID) | ORAL | Status: DC | PRN
Start: 2014-06-03 — End: 2014-06-03
  Filled 2014-06-03: qty 1

## 2014-06-03 MED ORDER — SIMETHICONE 80 MG PO CHEW
80.0000 mg | CHEWABLE_TABLET | Freq: Once | ORAL | Status: DC
  Filled 2014-06-03: qty 1

## 2014-06-03 MED ORDER — SIMETHICONE 80 MG PO CHEW: 80.0000 mg | CHEWABLE_TABLET | Freq: Four times a day (QID) | ORAL | Status: AC | PRN

## 2014-06-03 NOTE — Progress Notes (Signed)
Cross cover [facility]-GI Subjective: Since I last evaluated the patient, he seems to be doing much better. No further evidence of GI bleeding. He is going to have a regular meal today. Denies any GI complaints. No BM since yesterday. Wife anxious about taking him home in case he rebleeds.   Objective: Vital signs in last 24 hours: Temp:  [97.5 F (36.4 C)-98.7 F (37.1 C)] 97.5 F (36.4 C) (07/12 0800) Pulse Rate:  [67-84] 67 (07/11 1601) Resp:  [14-24] 17 (07/12 0842) BP: (131-163)/(53-72) 163/72 mmHg (07/12 0842) SpO2:  [94 %-97 %] 94 % (07/12 0500) Weight:  [71.3 kg (157 lb 3 oz)] 71.3 kg (157 lb 3 oz) (07/12 0500) Last BM Date: 06/02/14  Intake/Output from previous day: 07/11 0701 - 07/12 0700 In: 4216.7 [P.O.:360; I.V.:3856.7] Out: 2775 [Urine:2775] Intake/Output this shift:   General appearance: alert, cooperative, appears stated age, no distress and pale Resp: clear to auscultation bilaterally Cardio: regular rate and rhythm, S1, S2 normal, no murmur, click, rub or gallop GI: soft, non-tender; bowel sounds normal; no masses,  no organomegaly Extremities: extremities normal, atraumatic, no cyanosis or edema  Lab Results:  Recent Labs  06/01/14 1904  06/02/14 0620  06/02/14 1810 06/03/14 0030 06/03/14 0706  WBC 7.8  --  7.1  --   --   --   --   HGB 13.2  < > 10.6*  < > 10.8* 9.4* 10.5*  HCT 38.2*  < > 30.7*  < > 31.6* 27.1* 31.0*  PLT 204  --  163  --   --   --   --   < > = values in this interval not displayed. BMET  Recent Labs  06/01/14 1904 06/02/14 0620  NA 140 143  K 4.3 4.4  CL 103 109  CO2 25 24  GLUCOSE 102* 132*  BUN 13 15  CREATININE 0.53 0.62  CALCIUM 9.5 8.4   LFT  Recent Labs  06/01/14 1904  PROT 6.9  ALBUMIN 3.7  AST 30  ALT 15  ALKPHOS 42  BILITOT 0.2*   PT/INR  Recent Labs  06/01/14 1904  LABPROT 13.1  INR 0.99   Studies/Results: Dg Chest Portable 1 View  06/01/2014   CLINICAL DATA:  Rectal bleeding.  Dizziness  EXAM:  PORTABLE CHEST - 1 VIEW  COMPARISON:  07/24/2013  FINDINGS: Normal mediastinum and cardiac silhouette. Normal pulmonary vasculature. No evidence of effusion, infiltrate, or pneumothorax. No acute bony abnormality.  IMPRESSION: Normal chest radiograph.   Electronically Signed   By: Suzy Bouchard M.D.   On: 06/01/2014 18:53   Medications: I have reviewed the patient's current medications.  Assessment/Plan: Anemia with presumed diverticular bleed; Stable today. Agree with discharge as planned by THP and with close follow up with his gastroenterologist.  LOS: 2 days   Eris Breck 06/03/2014, 10:32 AM

## 2014-06-03 NOTE — Progress Notes (Signed)
Discharge instructions given to patient and wife. Both verbalized understanding of instructions. All questions answered. IV removed, belongings gathered. Patient discharged to home via wheelchair, accompanied by family.

## 2014-06-03 NOTE — Progress Notes (Signed)
Summary Note:  Jack Noble ,as he prefers, has been stable throughout the night. Last Bloody stool was 0230 am early on Friday morning, 06/01/14. H&H have been stabilizing; one pending this morning from 0630. Voiding frequently in large amounts, IVF continue at 114ml/hr.  Walked halls this am; heart rate 90-100.Active bowel sounds, nontender abdomen. Stable hemodynamics.

## 2014-06-03 NOTE — Progress Notes (Signed)
[facility] PROGRESS NOTE  Kiree Dejarnette GEX:[id number] DOB: [birth year] DOA: 06/01/2014 PCP: Unice Cobble, MD  Assessment/Plan: 80 y/o male with PMH of HPL, BPH, diverticula bleeding presented with sudden intermittent rectal bleeding since 7/10  1. Acute GIB suspected diverticula bleeding; BUN -15;  -colonoscopy (2010): Pan-diverticulosis -Hg stable; bleeding seems to decrease; hold ASA; tolerating diet well;  2. Acute blood loss anemia due to GIB;  -Hg 13.2-->10.6; but no new bleeding episodes; Hg is stable;  3. BPH, cont flomax;;  4. Episode of hypotension, syncope overnight likely vasovagal with BM on top of hydralazine;  -no new symptoms; resolved; neuro exam no focal;   D/c home today if okay with GI    Code Status: full Family Communication:  D/w patient, his wife  (indicate person spoken with, relationship, and if by phone, the number) Disposition Plan: home pend clinical improvement    Consultants:  GI  Procedures:  none  Antibiotics:  none (indicate start date, and stop date if known)  HPI/Subjective: alert  Objective: Filed Vitals:   06/03/14 1218  BP: 162/69  Pulse:   Temp:   Resp: 18    Intake/Output Summary (Last 24 hours) at 06/03/14 1239 Last data filed at 06/03/14 0900  Gross per 24 hour  Intake 4292.92 ml  Output   2225 ml  Net 2067.92 ml   Filed Weights   06/01/14 2142 06/02/14 0000 06/03/14 0500  Weight: 67.223 kg (148 lb 3.2 oz) 69.1 kg (152 lb 5.4 oz) 71.3 kg (157 lb 3 oz)    Exam:   General:  alert  Cardiovascular: s1,s2 rrr  Respiratory: CTA BL  Abdomen: soft, nt,nd   Musculoskeletal: no LE edema   Data Reviewed: Basic Metabolic Panel:  Recent Labs Lab 06/01/14 1904 06/02/14 0620  NA 140 143  K 4.3 4.4  CL 103 109  CO2 25 24  GLUCOSE 102* 132*  BUN 13 15  CREATININE 0.53 0.62  CALCIUM 9.5 8.4   Liver Function Tests:  Recent Labs Lab 06/01/14 1904  AST 30  ALT 15  ALKPHOS 42  BILITOT  0.2*  PROT 6.9  ALBUMIN 3.7   No results found for this basename: LIPASE, AMYLASE,  in the last 168 hours No results found for this basename: AMMONIA,  in the last 168 hours CBC:  Recent Labs Lab 06/01/14 1904  06/02/14 0620 06/02/14 1209 06/02/14 1810 06/03/14 0030 06/03/14 0706  WBC 7.8  --  7.1  --   --   --   --   NEUTROABS 5.7  --   --   --   --   --   --   HGB 13.2  < > 10.6* 10.1* 10.8* 9.4* 10.5*  HCT 38.2*  < > 30.7* 29.0* 31.6* 27.1* 31.0*  MCV 94.6  --  94.5  --   --   --   --   PLT 204  --  163  --   --   --   --   < > = values in this interval not displayed. Cardiac Enzymes:  Recent Labs Lab 06/02/14 0010 06/02/14 0620 06/02/14 1208  TROPONINI <0.30 <0.30 <0.30   BNP (last 3 results) No results found for this basename: PROBNP,  in the last 8760 hours CBG: No results found for this basename: GLUCAP,  in the last 168 hours  Recent Results (from the past 240 hour(s))  MRSA PCR SCREENING     Status: None   Collection Time    06/01/14  11:51 PM      Result Value Ref Range Status   MRSA by PCR NEGATIVE  NEGATIVE Final   Comment:            The GeneXpert MRSA Assay (FDA     approved for NASAL specimens     only), is one component of a     comprehensive MRSA colonization     surveillance program. It is not     intended to diagnose MRSA     infection nor to guide or     monitor treatment for     MRSA infections.     Studies: Dg Chest Portable 1 View  06/01/2014   CLINICAL DATA:  Rectal bleeding.  Dizziness  EXAM: PORTABLE CHEST - 1 VIEW  COMPARISON:  07/24/2013  FINDINGS: Normal mediastinum and cardiac silhouette. Normal pulmonary vasculature. No evidence of effusion, infiltrate, or pneumothorax. No acute bony abnormality.  IMPRESSION: Normal chest radiograph.   Electronically Signed   By: Suzy Bouchard M.D.   On: 06/01/2014 18:53    Scheduled Meds: . cholecalciferol  1,000 Units Oral q morning - 10a  . multivitamin with minerals  1 tablet Oral q  morning - 10a  . ondansetron (ZOFRAN) IV  4 mg Intravenous Once  . sodium chloride  3 mL Intravenous Q12H  . tamsulosin  0.4 mg Oral QPC supper  . timolol  1 drop Left Eye BID   Continuous Infusions:    Principal Problem:   BRBPR (bright red blood per rectum) Active Problems:   DIVERTICULOSIS, COLON   Hypertension    Time spent: >35 minutes     Kinnie Feil  [facility] Pager [phone]. If 7PM-7AM, please contact night-coverage at [email or web address], password [facility] 06/03/2014, 12:39 PM  LOS: 2 days

## 2014-06-03 NOTE — Discharge Instructions (Signed)
Please follow up with Primary care doctor in 1-2 week s Please follow up with gastroenterologist in 3-4 weeks

## 2014-06-03 NOTE — Discharge Summary (Signed)
Physician Discharge Summary  Jack Noble ZDG:[id number] DOB: 1929/09/09 DOA: 06/01/2014  PCP: Unice Cobble, MD  Admit date: 06/01/2014 Discharge date: 06/03/2014  Time spent: >35 minutes  Recommendations for Outpatient Follow-up:  F/u with PCP in 1-2 week s F/u with GI in 3-4 weeks Discharge Diagnoses:  Principal Problem:   BRBPR (bright red blood per rectum) Active Problems:   DIVERTICULOSIS, COLON   Hypertension   Discharge Condition: stable   Diet recommendation: low sodium   Filed Weights   06/01/14 2142 06/02/14 0000 06/03/14 0500  Weight: 67.223 kg (148 lb 3.2 oz) 69.1 kg (152 lb 5.4 oz) 71.3 kg (157 lb 3 oz)    History of present illness:  80 y/o male with PMH of HPL, BPH, diverticula bleeding presented with sudden intermittent rectal bleeding since 7/10  Hospital Course:  1. Acute GIB suspected diverticula bleeding; BUN -15;  -colonoscopy (2010): Pan-diverticulosis -Hg stable; bleeding seems to stopped; hold ASA for 2 weeks; tolerating diet well; outpatient f/u with GI 3-4 weeks as needed  2. Acute blood loss anemia due to GIB;  -Hg 13.2-->10.6; but no new bleeding episodes; Hg is stable; no transfusion during hospital stay  3. BPH, cont flomax;;  4. Episode of hypotension, syncope overnight likely vasovagal with BM on top of hydralazine;  -no new symptoms; resolved; neuro exam no focal;  5. probable  MCI with speech difficulty; cont OP follow up     Procedures:  none (i.e. Studies not automatically included, echos, thoracentesis, etc; not x-rays)  Consultations:  GI  Discharge Exam: Filed Vitals:   06/03/14 1218  BP: 162/69  Pulse:   Temp:   Resp: 18    General: alert Cardiovascular: s1,s2 rrr Respiratory: CTA BL  Discharge Instructions  Discharge Instructions   Diet - low sodium heart healthy    Complete by:  As directed      Discharge instructions    Complete by:  As directed   Please follow up with primary care doctor in 1-2  week s     Increase activity slowly    Complete by:  As directed             Medication List    STOP taking these medications       aspirin EC 81 MG tablet      TAKE these medications       ISTALOL 0.5 % (DAILY) Soln  Generic drug:  Timolol Maleate  Place 1 drop into the left eye 2 (two) times daily at 8 am and 10 pm.     ketoconazole 2 % cream  Commonly known as:  NIZORAL  Apply 1 application topically 2 (two) times daily as needed for irritation.     mometasone 0.1 % ointment  Commonly known as:  ELOCON  Apply 1 application topically 2 (two) times daily as needed (Applies to affected area on hands.).     multivitamin with minerals Tabs tablet  Take 1 tablet by mouth every morning.     nystatin-triamcinolone cream  Commonly known as:  MYCOLOG II  Apply 1 application topically daily as needed (Applies to red places in groin area.).     tamsulosin 0.4 MG Caps capsule  Commonly known as:  FLOMAX  Take 0.4 mg by mouth daily.     Vitamin D 1000 UNITS capsule  Take 1,000 Units by mouth every morning.       Allergies  Allergen Reactions  . Amoxicillin     urticaria  . Codeine  Rash Because of a history of documented adverse serious drug reaction;Medi Alert bracelet  is recommended  . Minocycline Hcl     REACTION: nervous/chest pressure (Note: may have affected GE valve)  . Astelin [Azelastine Hcl]     Makes me jittery  . Ether     pneumonia  . Zocor [Simvastatin] Other (See Comments)    Itching w/o, muscle problems and it affected his stomach  . Metoprolol     lightheaded       Follow-up Information   Follow up with Unice Cobble, MD. Schedule an appointment as soon as possible for a visit in 1 week.   Specialty:  Internal Medicine   Contact information:   [address] [address] [phone]        The results of significant diagnostics from this hospitalization (including imaging, microbiology, ancillary and laboratory) are  listed below for reference.    Significant Diagnostic Studies: Dg Chest Portable 1 View  06/01/2014   CLINICAL DATA:  Rectal bleeding.  Dizziness  EXAM: PORTABLE CHEST - 1 VIEW  COMPARISON:  07/24/2013  FINDINGS: Normal mediastinum and cardiac silhouette. Normal pulmonary vasculature. No evidence of effusion, infiltrate, or pneumothorax. No acute bony abnormality.  IMPRESSION: Normal chest radiograph.   Electronically Signed   By: Suzy Bouchard M.D.   On: 06/01/2014 18:53    Microbiology: Recent Results (from the past 240 hour(s))  MRSA PCR SCREENING     Status: None   Collection Time    06/01/14 11:51 PM      Result Value Ref Range Status   MRSA by PCR NEGATIVE  NEGATIVE Final   Comment:            The GeneXpert MRSA Assay (FDA     approved for NASAL specimens     only), is one component of a     comprehensive MRSA colonization     surveillance program. It is not     intended to diagnose MRSA     infection nor to guide or     monitor treatment for     MRSA infections.     Labs: Basic Metabolic Panel:  Recent Labs Lab 06/01/14 1904 06/02/14 0620  NA 140 143  K 4.3 4.4  CL 103 109  CO2 25 24  GLUCOSE 102* 132*  BUN 13 15  CREATININE 0.53 0.62  CALCIUM 9.5 8.4   Liver Function Tests:  Recent Labs Lab 06/01/14 1904  AST 30  ALT 15  ALKPHOS 42  BILITOT 0.2*  PROT 6.9  ALBUMIN 3.7   No results found for this basename: LIPASE, AMYLASE,  in the last 168 hours No results found for this basename: AMMONIA,  in the last 168 hours CBC:  Recent Labs Lab 06/01/14 1904  06/02/14 0620 06/02/14 1209 06/02/14 1810 06/03/14 0030 06/03/14 0706  WBC 7.8  --  7.1  --   --   --   --   NEUTROABS 5.7  --   --   --   --   --   --   HGB 13.2  < > 10.6* 10.1* 10.8* 9.4* 10.5*  HCT 38.2*  < > 30.7* 29.0* 31.6* 27.1* 31.0*  MCV 94.6  --  94.5  --   --   --   --   PLT 204  --  163  --   --   --   --   < > = values in this interval not displayed. Cardiac Enzymes:  Recent  Labs Lab 06/02/14 0010 06/02/14 0620 06/02/14 1208  TROPONINI <0.30 <0.30 <0.30   BNP: BNP (last 3 results) No results found for this basename: PROBNP,  in the last 8760 hours CBG: No results found for this basename: GLUCAP,  in the last 168 hours     Signed:  Kinnie Feil  [facility] 06/03/2014, 12:44 PM

## 2014-06-07 ENCOUNTER — Ambulatory Visit: Payer: MEDICARE

## 2014-06-08 ENCOUNTER — Ambulatory Visit (INDEPENDENT_AMBULATORY_CARE_PROVIDER_SITE_OTHER): Payer: MEDICARE | Admitting: Internal Medicine

## 2014-06-08 ENCOUNTER — Encounter: Payer: Self-pay | Admitting: Internal Medicine

## 2014-06-08 ENCOUNTER — Other Ambulatory Visit (INDEPENDENT_AMBULATORY_CARE_PROVIDER_SITE_OTHER): Payer: MEDICARE

## 2014-06-08 VITALS — BP 136/70 | HR 74 | Temp 98.2°F | Wt 155.0 lb

## 2014-06-08 DIAGNOSIS — Z8719 Personal history of other diseases of the digestive system: Secondary | ICD-10-CM

## 2014-06-08 LAB — CBC WITH DIFFERENTIAL/PLATELET
BASOS ABS: 0 10*3/uL (ref 0.0–0.1)
Basophils Relative: 0.6 % (ref 0.0–3.0)
EOS PCT: 2.1 % (ref 0.0–5.0)
Eosinophils Absolute: 0.1 10*3/uL (ref 0.0–0.7)
HEMATOCRIT: 33.6 % — AB (ref 39.0–52.0)
Hemoglobin: 11.4 g/dL — ABNORMAL LOW (ref 13.0–17.0)
LYMPHS ABS: 2 10*3/uL (ref 0.7–4.0)
Lymphocytes Relative: 30.1 % (ref 12.0–46.0)
MCHC: 34 g/dL (ref 30.0–36.0)
MCV: 97.7 fl (ref 78.0–100.0)
MONO ABS: 0.5 10*3/uL (ref 0.1–1.0)
Monocytes Relative: 7.4 % (ref 3.0–12.0)
Neutro Abs: 4 10*3/uL (ref 1.4–7.7)
Neutrophils Relative %: 59.8 % (ref 43.0–77.0)
PLATELETS: 246 10*3/uL (ref 150.0–400.0)
RBC: 3.44 Mil/uL — ABNORMAL LOW (ref 4.22–5.81)
RDW: 14 % (ref 11.5–15.5)
WBC: 6.6 10*3/uL (ref 4.0–10.5)

## 2014-06-08 NOTE — Progress Notes (Signed)
   Subjective:    Patient ID: Jack Noble, male    DOB: 02-Jun-1929, 80 y.o.   MRN: [id number]  HPI Pt is here for hospital f/u. Presented to ED 06/01/14 for acute rectal bleeding. Pt likely to have had acute GIB d/t bleeding known diverticula. Pt's H&H was 13.2/38.2 upon presentation. He did not require RBC transfusion. D/c on 06/03/14 with H&H of 10.1/29.7. Pt to hold ASA x 2 weeks. Will f/u with GI in 3-4 weeks. Pt's wife requests to see Dr. Sharlett Iles for GI.  Today pt is no longer having rectal bleeding. He is not experiencing any chest pain, palpitations nor SOB. He is fatigued and slightly light headed. His wife reports he just doesn't seem to have any energy.  Review of Systems  Respiratory: Negative for shortness of breath.   Cardiovascular: Negative for chest pain and palpitations.  Gastrointestinal: Negative for abdominal pain and blood in stool.       No melena       Objective:   Physical Exam Abdominal hernia      Assessment & Plan:  #1 anemia; would consider adding MVI with iron

## 2014-06-08 NOTE — Progress Notes (Signed)
   Subjective:    Patient ID: Jack Noble, male    DOB: [birth year], 80 y.o.   MRN: [id number]  HPI  He was hospitalized with acute G.I. bleed 06/01/14. Initial hematocrit was 13.2 and hemoglobin 38.2. Endoscopy revealed no bleeding lesion. Bleeding was attributed to a diverticular lead as he has known diverticulosis. No transfusion was required. At the time of discharge his hemoglobin was 10.1 hematocrit 29.7. He was told to hold his aspirin for two weeks. He will follow up with gastroenterology. He previously has seen Dr. Ardis Hughs    Review of Systems  He denies any active G.I. symptoms. He does describe fatigue and some lightheadedness.  He specifically denies chest pain, palpitations, dyspnea.  He also has no abdominal pain, dysphagia, dyspepsia, melena or  rectal bleeding.       Objective:   Physical Exam General appearance is one of good health and nourishment w/o distress. Pattern baldness.  Eyes: No conjunctival inflammation or scleral icterus is present.  Oral exam: Dental hygiene is good; lips and gums are healthy appearing.There is no oropharyngeal erythema or exudate noted.   Heart:  Normal rate and regular rhythm. S1 and S2 normal without gallop, murmur, click, rub or other extra sounds     Lungs:Chest clear to auscultation; no wheezes, rhonchi,rales ,or rubs present.No increased work of breathing.   Abdomen: bowel sounds normal, soft and non-tender without masses or organomegaly .Large incisonal hernia noted.  No guarding or rebound .  Musculoskeletal: Able to lie flat and sit up without help. Negative straight leg raising bilaterally. Gait normal  Skin:Warm & dry.  Intact without suspicious lesions or rashes ; no jaundice; slight tenting  Trace edema.  Lymphatic: No lymphadenopathy is noted about the head, neck, axilla               Assessment & Plan:  #1 S/P diverticular bleed; clinically stable See orders

## 2014-06-08 NOTE — Patient Instructions (Signed)
Your next office appointment will be determined based upon review of your pending labs . Those instructions will be transmitted to you  by mail. 

## 2014-06-08 NOTE — Progress Notes (Signed)
Pre visit review using our clinic review tool, if applicable. No additional management support is needed unless otherwise documented below in the visit note. 

## 2014-06-10 ENCOUNTER — Encounter: Payer: Self-pay | Admitting: Internal Medicine

## 2014-06-12 ENCOUNTER — Ambulatory Visit: Payer: MEDICARE | Attending: Neurology

## 2014-06-12 DIAGNOSIS — IMO0001 Reserved for inherently not codable concepts without codable children: Secondary | ICD-10-CM | POA: Insufficient documentation

## 2014-06-12 DIAGNOSIS — R4701 Aphasia: Secondary | ICD-10-CM | POA: Insufficient documentation

## 2014-06-26 ENCOUNTER — Other Ambulatory Visit: Payer: Self-pay | Admitting: Internal Medicine

## 2014-06-27 ENCOUNTER — Other Ambulatory Visit: Payer: Self-pay | Admitting: Internal Medicine

## 2014-06-27 ENCOUNTER — Telehealth: Payer: Self-pay | Admitting: Internal Medicine

## 2014-06-27 DIAGNOSIS — D649 Anemia, unspecified: Secondary | ICD-10-CM

## 2014-06-27 NOTE — Telephone Encounter (Signed)
Has received lab results for Jack Noble.  Would like to know what next steps they need to take.

## 2014-06-27 NOTE — Telephone Encounter (Signed)
I spoke to patient's wife and let her know he is to continue the dietary iron supplements. She asks when is he to follow up with you again? Still having weak spells.

## 2014-06-27 NOTE — Telephone Encounter (Signed)
CBC should be repeated in 4 weeks;it takes 6 weeks to resolve anemia

## 2014-06-27 NOTE — Telephone Encounter (Signed)
Please mail last labs again ; thanks. [employer]

## 2014-06-28 ENCOUNTER — Ambulatory Visit: Payer: MEDICARE

## 2014-06-28 NOTE — Telephone Encounter (Signed)
Patient's wife has been advised.

## 2014-07-03 ENCOUNTER — Ambulatory Visit: Payer: MEDICARE | Attending: Neurology

## 2014-07-03 DIAGNOSIS — IMO0001 Reserved for inherently not codable concepts without codable children: Secondary | ICD-10-CM | POA: Diagnosis not present

## 2014-07-05 ENCOUNTER — Ambulatory Visit: Payer: MEDICARE

## 2014-07-05 DIAGNOSIS — IMO0001 Reserved for inherently not codable concepts without codable children: Secondary | ICD-10-CM | POA: Diagnosis not present

## 2014-07-10 ENCOUNTER — Ambulatory Visit: Payer: MEDICARE

## 2014-07-10 DIAGNOSIS — L719 Rosacea, unspecified: Secondary | ICD-10-CM | POA: Diagnosis not present

## 2014-07-10 DIAGNOSIS — IMO0001 Reserved for inherently not codable concepts without codable children: Secondary | ICD-10-CM | POA: Diagnosis not present

## 2014-07-10 DIAGNOSIS — L259 Unspecified contact dermatitis, unspecified cause: Secondary | ICD-10-CM | POA: Diagnosis not present

## 2014-07-12 ENCOUNTER — Ambulatory Visit: Payer: MEDICARE

## 2014-07-12 DIAGNOSIS — IMO0001 Reserved for inherently not codable concepts without codable children: Secondary | ICD-10-CM | POA: Diagnosis not present

## 2014-07-20 ENCOUNTER — Ambulatory Visit: Payer: MEDICARE

## 2014-07-20 DIAGNOSIS — IMO0001 Reserved for inherently not codable concepts without codable children: Secondary | ICD-10-CM | POA: Diagnosis not present

## 2014-07-24 ENCOUNTER — Encounter: Payer: MEDICARE | Admitting: Speech Pathology

## 2014-07-27 ENCOUNTER — Ambulatory Visit: Payer: MEDICARE | Attending: Neurology

## 2014-07-27 DIAGNOSIS — IMO0001 Reserved for inherently not codable concepts without codable children: Secondary | ICD-10-CM | POA: Diagnosis not present

## 2014-07-31 ENCOUNTER — Other Ambulatory Visit (INDEPENDENT_AMBULATORY_CARE_PROVIDER_SITE_OTHER): Payer: MEDICARE

## 2014-07-31 DIAGNOSIS — D649 Anemia, unspecified: Secondary | ICD-10-CM

## 2014-07-31 LAB — CBC WITH DIFFERENTIAL/PLATELET
BASOS ABS: 0 10*3/uL (ref 0.0–0.1)
Basophils Relative: 0.4 % (ref 0.0–3.0)
EOS PCT: 1.2 % (ref 0.0–5.0)
Eosinophils Absolute: 0.1 10*3/uL (ref 0.0–0.7)
HCT: 41.7 % (ref 39.0–52.0)
Hemoglobin: 14.2 g/dL (ref 13.0–17.0)
Lymphocytes Relative: 30.3 % (ref 12.0–46.0)
Lymphs Abs: 1.8 10*3/uL (ref 0.7–4.0)
MCHC: 34 g/dL (ref 30.0–36.0)
MCV: 96.1 fl (ref 78.0–100.0)
MONO ABS: 0.4 10*3/uL (ref 0.1–1.0)
Monocytes Relative: 7.3 % (ref 3.0–12.0)
NEUTROS PCT: 60.8 % (ref 43.0–77.0)
Neutro Abs: 3.6 10*3/uL (ref 1.4–7.7)
PLATELETS: 197 10*3/uL (ref 150.0–400.0)
RBC: 4.34 Mil/uL (ref 4.22–5.81)
RDW: 12.8 % (ref 11.5–15.5)
WBC: 5.9 10*3/uL (ref 4.0–10.5)

## 2014-07-31 LAB — IBC PANEL
IRON: 70 ug/dL (ref 42–165)
Saturation Ratios: 23.7 % (ref 20.0–50.0)
TRANSFERRIN: 211.3 mg/dL — AB (ref 212.0–360.0)

## 2014-08-07 ENCOUNTER — Encounter: Payer: Self-pay | Admitting: Internal Medicine

## 2014-08-10 ENCOUNTER — Other Ambulatory Visit: Payer: Self-pay | Admitting: Internal Medicine

## 2014-08-10 NOTE — Telephone Encounter (Signed)
I thought Urology was Rxing OK if I am

## 2014-08-10 NOTE — Telephone Encounter (Signed)
I do not see this on patient's current medication list

## 2014-08-15 ENCOUNTER — Ambulatory Visit (INDEPENDENT_AMBULATORY_CARE_PROVIDER_SITE_OTHER): Payer: MEDICARE

## 2014-08-15 DIAGNOSIS — Z23 Encounter for immunization: Secondary | ICD-10-CM

## 2014-08-29 ENCOUNTER — Telehealth: Payer: Self-pay | Admitting: Gastroenterology

## 2014-08-29 NOTE — Telephone Encounter (Signed)
The pt is not having any current bleeding, but would like to schedule f/u with Dr Ardis Hughs.  He was added to Dec 7 but advised that he can call anytime prior with concerns or questions

## 2014-09-14 ENCOUNTER — Encounter: Payer: Self-pay | Admitting: Internal Medicine

## 2014-09-14 ENCOUNTER — Ambulatory Visit (INDEPENDENT_AMBULATORY_CARE_PROVIDER_SITE_OTHER): Payer: MEDICARE | Admitting: Internal Medicine

## 2014-09-14 VITALS — BP 138/62 | HR 60 | Temp 97.4°F | Resp 14 | Ht 66.0 in | Wt 153.4 lb

## 2014-09-14 DIAGNOSIS — X32XXXA Exposure to sunlight, initial encounter: Principal | ICD-10-CM

## 2014-09-14 DIAGNOSIS — C4490 Unspecified malignant neoplasm of skin, unspecified: Secondary | ICD-10-CM | POA: Insufficient documentation

## 2014-09-14 DIAGNOSIS — L570 Actinic keratosis: Secondary | ICD-10-CM | POA: Diagnosis not present

## 2014-09-14 DIAGNOSIS — Z85828 Personal history of other malignant neoplasm of skin: Secondary | ICD-10-CM

## 2014-09-14 NOTE — Progress Notes (Signed)
   Subjective:    Patient ID: Jack Noble, male    DOB: Oct 16, 1929, 80 y.o.   MRN: [id number]  HPI   He's had some itching and irritation of a lesion of the right forearm the last 2 weeks. He denies any vesicle or pustule formation. He's had no constitutional symptoms.   He has been applying moisturizing lotion at bedtime. There has been some improvement in the lesion with this treatment.  He has a past history of skin cancers removed by Dr. Denna Haggard. He is unsure of the cell type.      Review of Systems  No associated itchy, watery eyes.  Swelling of the lips or tongue denied.  Shortness of breath, wheezing, or cough absent.  No rash or urticaria noted.  Fever ,chills , or sweats denied.   Diarrhea not present.     Objective:   Physical Exam   There is a 1.5 x 1 cm scaly slightly erythematous, flat lesion on the right forearm. He has scattered senile keratoses.  He has no lymphadenopathy about the neck or axilla  He has no hepatomegaly or splenomegaly. There is a large ventral hernia       Assessment & Plan:  #1 subtle or senile keratosis  Plan: Continue the moisturizing lotion twice a day. If there is not complete resolution 4 weeks; he should see his dermatologist.

## 2014-09-14 NOTE — Patient Instructions (Signed)
Use Eucerin or Aveeno Daily  Moisturizing Lotion  twice a day  for the dry skin. Bathe with moisturizing liquid soap , not bar soap.  I recommend a Derm  consultation to determine optimal therapy if no better in 4 weeks.

## 2014-09-14 NOTE — Progress Notes (Signed)
Pre visit review using our clinic review tool, if applicable. No additional management support is needed unless otherwise documented below in the visit note. 

## 2014-10-01 DIAGNOSIS — H4052X1 Glaucoma secondary to other eye disorders, left eye, mild stage: Secondary | ICD-10-CM | POA: Diagnosis not present

## 2014-10-03 ENCOUNTER — Ambulatory Visit (INDEPENDENT_AMBULATORY_CARE_PROVIDER_SITE_OTHER): Payer: MEDICARE | Admitting: Internal Medicine

## 2014-10-03 ENCOUNTER — Encounter: Payer: Self-pay | Admitting: Internal Medicine

## 2014-10-03 VITALS — BP 138/78 | HR 69 | Temp 97.3°F | Resp 12 | Wt 154.1 lb

## 2014-10-03 DIAGNOSIS — R05 Cough: Secondary | ICD-10-CM | POA: Diagnosis not present

## 2014-10-03 DIAGNOSIS — R059 Cough, unspecified: Secondary | ICD-10-CM

## 2014-10-03 DIAGNOSIS — J310 Chronic rhinitis: Secondary | ICD-10-CM

## 2014-10-03 NOTE — Progress Notes (Signed)
Pre visit review using our clinic review tool, if applicable. No additional management support is needed unless otherwise documented below in the visit note. 

## 2014-10-03 NOTE — Patient Instructions (Signed)

## 2014-10-03 NOTE — Progress Notes (Signed)
   Subjective:    Patient ID: Jack Noble, male    DOB: 02-May-1929, 80 y.o.   MRN: [id number]  HPI Symptoms began 09/30/12 as pain in the right ear associated with sore throat and nonproductive cough. He had rhinorrhea with clear secretions. The symptoms continued for 3 days until they resolved after taking Tylenol and using saline nasal spray.  At this time he has no symptoms of active upper respiratory tract infection.    Review of Systems Frontal headache, facial pain , nasal purulence, dental pain, sore throat , otic pain or otic discharge denied @ this time. No fever , chills or sweats.  Extrinsic symptoms of itchy, watery eyes, sneezing, or angioedema are denied. There is no sputum production, wheezing,or  paroxysmal nocturnal dyspnea.  His urologist has stopped his Flomax.    Objective:   Physical Exam   Positive or pertinent findings include: Nares are slightly erythematous and boggy /moist. He has intermittent nonproductive cough. He has intermittent ptosis of the right eye.  General appearance:good health ;well nourished; no acute distress or increased work of breathing is present.  No  lymphadenopathy about the head, neck, or axilla noted.  Eyes: No conjunctival inflammation or lid edema is present. There is no scleral icterus. Ears:  External ear exam shows no significant lesions or deformities.  Otoscopic examination reveals clear canals, tympanic membranes are intact bilaterally without bulging, retraction, inflammation or discharge. Nose:  External nasal examination shows no deformity or inflammation. No septal dislocation or deviation.No obstruction to airflow.  Oral exam: Dental hygiene is good; lips and gums are healthy appearing.There is no oropharyngeal erythema or exudate noted.  Neck:  No deformities,  masses, or tenderness noted.   Heart:  Normal rate and regular rhythm. S1 and S2 normal without gallop, murmur, click, rub or other extra sounds.  Lungs:Chest  clear to auscultation; no wheezes, rhonchi,rales ,or rubs present.No increased work of breathing.   Extremities:  No cyanosis, edema, or clubbing  noted  Skin: Warm & dry w/o jaundice or tenting.          Assessment & Plan:  #1non  allergic rhinitis #2 cough due to #1 See orders & AVS

## 2014-10-10 DIAGNOSIS — D0461 Carcinoma in situ of skin of right upper limb, including shoulder: Secondary | ICD-10-CM | POA: Diagnosis not present

## 2014-10-10 DIAGNOSIS — B958 Unspecified staphylococcus as the cause of diseases classified elsewhere: Secondary | ICD-10-CM | POA: Diagnosis not present

## 2014-10-10 DIAGNOSIS — D485 Neoplasm of uncertain behavior of skin: Secondary | ICD-10-CM | POA: Diagnosis not present

## 2014-10-10 DIAGNOSIS — L080 Pyoderma: Secondary | ICD-10-CM | POA: Diagnosis not present

## 2014-10-10 DIAGNOSIS — T798XXA Other early complications of trauma, initial encounter: Secondary | ICD-10-CM | POA: Diagnosis not present

## 2014-10-10 DIAGNOSIS — L309 Dermatitis, unspecified: Secondary | ICD-10-CM | POA: Diagnosis not present

## 2014-10-15 ENCOUNTER — Other Ambulatory Visit (INDEPENDENT_AMBULATORY_CARE_PROVIDER_SITE_OTHER): Payer: MEDICARE

## 2014-10-15 ENCOUNTER — Encounter: Payer: Self-pay | Admitting: Internal Medicine

## 2014-10-15 ENCOUNTER — Ambulatory Visit (INDEPENDENT_AMBULATORY_CARE_PROVIDER_SITE_OTHER): Payer: MEDICARE | Admitting: Internal Medicine

## 2014-10-15 VITALS — BP 130/74 | HR 71 | Temp 97.5°F | Wt 153.0 lb

## 2014-10-15 DIAGNOSIS — R6883 Chills (without fever): Secondary | ICD-10-CM | POA: Diagnosis not present

## 2014-10-15 DIAGNOSIS — R531 Weakness: Secondary | ICD-10-CM

## 2014-10-15 LAB — CBC WITH DIFFERENTIAL/PLATELET
BASOS ABS: 0 10*3/uL (ref 0.0–0.1)
Basophils Relative: 0.7 % (ref 0.0–3.0)
EOS ABS: 0.1 10*3/uL (ref 0.0–0.7)
Eosinophils Relative: 1.7 % (ref 0.0–5.0)
HCT: 41.4 % (ref 39.0–52.0)
Hemoglobin: 13.7 g/dL (ref 13.0–17.0)
Lymphocytes Relative: 31.5 % (ref 12.0–46.0)
Lymphs Abs: 1.7 10*3/uL (ref 0.7–4.0)
MCHC: 33.1 g/dL (ref 30.0–36.0)
MCV: 95.1 fl (ref 78.0–100.0)
Monocytes Absolute: 0.5 10*3/uL (ref 0.1–1.0)
Monocytes Relative: 8.5 % (ref 3.0–12.0)
NEUTROS PCT: 57.6 % (ref 43.0–77.0)
Neutro Abs: 3.1 10*3/uL (ref 1.4–7.7)
Platelets: 256 10*3/uL (ref 150.0–400.0)
RBC: 4.35 Mil/uL (ref 4.22–5.81)
RDW: 13.6 % (ref 11.5–15.5)
WBC: 5.4 10*3/uL (ref 4.0–10.5)

## 2014-10-15 LAB — SEDIMENTATION RATE: Sed Rate: 21 mm/hr (ref 0–22)

## 2014-10-15 LAB — BASIC METABOLIC PANEL
BUN: 14 mg/dL (ref 6–23)
CO2: 29 mEq/L (ref 19–32)
Calcium: 9.7 mg/dL (ref 8.4–10.5)
Chloride: 103 mEq/L (ref 96–112)
Creatinine, Ser: 0.7 mg/dL (ref 0.4–1.5)
GFR: 122.29 mL/min (ref >60.00)
Glucose, Bld: 92 mg/dL (ref 70–99)
POTASSIUM: 4.3 meq/L (ref 3.5–5.1)
Sodium: 140 mEq/L (ref 135–145)

## 2014-10-15 LAB — TSH: TSH: 0.99 u[IU]/mL (ref 0.35–4.50)

## 2014-10-15 NOTE — Progress Notes (Signed)
Pre visit review using our clinic review tool, if applicable. No additional management support is needed unless otherwise documented below in the visit note. 

## 2014-10-15 NOTE — Patient Instructions (Signed)
Your next office appointment will be determined based upon review of your pending labs . Those instructions will be transmitted to you through by mail or phone. Followup as needed for your acute issue. Please report any significant change in your symptoms; please report any signs or symptoms of infection such as fever, sputum production, nasal discharge,pain with urination , diarrhea, etc.

## 2014-10-15 NOTE — Progress Notes (Signed)
   Subjective:    Patient ID: Jack Noble, male    DOB: [birth year], 80 y.o.   MRN: [id number]  HPI   He describes weakness since 10/10/14 without localizing signs or symptoms  He did have some chills 11/19; his temperature was 97.1 all day.  Intermittently will have some suprapubic discomfort which responds to Mylanta.  He denies any localized respiratory or GI symptoms otherwise. Has no bleeding dyscrasias.    Review of Systems Frontal headache, facial pain , nasal purulence, dental pain, sore throat , otic pain or otic discharge denied. No fever or sweats.Unexplained weight loss, significant dyspepsia, dysphagia, melena, rectal bleeding, or persistently small caliber stools are denied. Dysuria, pyuria, hematuria, frequency, nocturia or polyuria are denied. Epistaxis, hemoptysis, melena, or rectal bleeding denied. There is no abnormal bruising , bleeding, or difficulty stopping bleeding with injury.     Objective:   Physical Exam General appearance is one of adequate nourishment w/o distress.  Eyes: No conjunctival inflammation or scleral icterus is present.  Oral exam: Dental hygiene is good; lips and gums are healthy appearing.There is no oropharyngeal erythema or exudate noted.   Heart:  Normal rate and regular rhythm. S1 and S2 normal without gallop, murmur, click, rub or other extra sounds     Lungs:Chest clear to auscultation; no wheezes, rhonchi,rales ,or rubs present.No increased work of breathing.   Abdomen: bowel sounds normal, soft and non-tender without masses, or organomegaly  noted. Ventral post op hernia. No guarding or rebound . No tenderness over the flanks to percussion  Musculoskeletal: Able to lie flat and sit up without help. Gait slow & broad  Skin:Warm & dry.  Intact without suspicious lesions or rashes ; no jaundice or tenting. S/P skin biopsy R forearm  Lymphatic: No lymphadenopathy is noted about the head, neck, axilla                Assessment & Plan:  #1 weakness #2 chills See orders

## 2014-10-29 ENCOUNTER — Ambulatory Visit: Payer: MEDICARE | Admitting: Gastroenterology

## 2014-11-06 DIAGNOSIS — R350 Frequency of micturition: Secondary | ICD-10-CM | POA: Diagnosis not present

## 2014-11-06 DIAGNOSIS — N5201 Erectile dysfunction due to arterial insufficiency: Secondary | ICD-10-CM | POA: Diagnosis not present

## 2014-11-06 DIAGNOSIS — N400 Enlarged prostate without lower urinary tract symptoms: Secondary | ICD-10-CM | POA: Diagnosis not present

## 2014-11-07 ENCOUNTER — Other Ambulatory Visit: Payer: Self-pay | Admitting: Internal Medicine

## 2014-12-13 DIAGNOSIS — D0460 Carcinoma in situ of skin of unspecified upper limb, including shoulder: Secondary | ICD-10-CM | POA: Diagnosis not present

## 2014-12-24 HISTORY — PX: SKIN BIOPSY: SHX1

## 2014-12-25 ENCOUNTER — Ambulatory Visit: Payer: MEDICARE | Admitting: Gastroenterology

## 2015-01-23 DIAGNOSIS — L820 Inflamed seborrheic keratosis: Secondary | ICD-10-CM | POA: Diagnosis not present

## 2015-01-23 DIAGNOSIS — L719 Rosacea, unspecified: Secondary | ICD-10-CM | POA: Diagnosis not present

## 2015-01-23 DIAGNOSIS — L821 Other seborrheic keratosis: Secondary | ICD-10-CM | POA: Diagnosis not present

## 2015-01-23 DIAGNOSIS — D485 Neoplasm of uncertain behavior of skin: Secondary | ICD-10-CM | POA: Diagnosis not present

## 2015-01-23 DIAGNOSIS — L309 Dermatitis, unspecified: Secondary | ICD-10-CM | POA: Diagnosis not present

## 2015-01-30 DIAGNOSIS — Z029 Encounter for administrative examinations, unspecified: Secondary | ICD-10-CM | POA: Diagnosis not present

## 2015-02-06 ENCOUNTER — Ambulatory Visit (INDEPENDENT_AMBULATORY_CARE_PROVIDER_SITE_OTHER): Payer: MEDICARE | Admitting: Internal Medicine

## 2015-02-06 ENCOUNTER — Ambulatory Visit: Payer: PRIVATE HEALTH INSURANCE | Admitting: Internal Medicine

## 2015-02-06 ENCOUNTER — Encounter: Payer: Self-pay | Admitting: Internal Medicine

## 2015-02-06 VITALS — BP 144/80 | HR 64 | Temp 97.8°F | Ht 66.0 in | Wt 151.2 lb

## 2015-02-06 DIAGNOSIS — J309 Allergic rhinitis, unspecified: Secondary | ICD-10-CM | POA: Diagnosis not present

## 2015-02-06 MED ORDER — MONTELUKAST SODIUM 10 MG PO TABS: 10.0000 mg | ORAL_TABLET | Freq: Every day | ORAL | Status: DC

## 2015-02-06 NOTE — Patient Instructions (Signed)

## 2015-02-06 NOTE — Progress Notes (Signed)
Pre visit review using our clinic review tool, if applicable. No additional management support is needed unless otherwise documented below in the visit note. 

## 2015-02-06 NOTE — Progress Notes (Signed)
   Subjective:    Patient ID: Jack Noble, male    DOB: 24-Dec-1928, 81 y.o.   MRN: [id number]  HPI  Symptoms began 02/03/15 as cough and nasal congestion. All nasal secretions are clear. Cough is productive of clear sputum as well. He's been using saline intranasally & Tylenol with minimal benefit.  Review of Systems  He denies itchy, watery eyes. He has no shortness of breath or wheezing.  He has no fever, chills, sweats.  There is no frontal headache, facial pain, nasal purulence, dental pain, otic pain, otic discharge.    Objective:   Physical Exam  Pertinent positive findings include: Pattern alopecia is present. Abdomen is protuberant with ventral hernia.  General appearance:Adequately nourished; no acute distress or increased work of breathing is present.  No  lymphadenopathy about the head, neck, or axilla noted.  Eyes: No conjunctival inflammation or lid edema is present. There is no scleral icterus. Ears:  External ear exam shows no significant lesions or deformities.  Otoscopic examination reveals clear canals, tympanic membranes are intact bilaterally without bulging, retraction, inflammation or discharge. Nose:  External nasal examination shows no deformity or inflammation. Nasal mucosa are pink and moist without lesions or exudates. No septal dislocation or deviation.No obstruction to airflow.  Oral exam: Dental hygiene is good; lips and gums are healthy appearing.There is no oropharyngeal erythema or exudate noted.  Neck:  No deformities, thyromegaly, masses, or tenderness noted.   Supple with full range of motion without pain.  Heart:  Normal rate and regular rhythm. S1 and S2 normal without gallop, murmur, click, rub or other extra sounds.  Lungs:Chest clear to auscultation; no wheezes, rhonchi,rales ,or rubs present. Extremities:  No cyanosis, edema, or clubbing  noted  Skin: Warm & dry w/o jaundice or tenting.      Assessment & Plan:  #1 allergic  rhinitis  Plan: See orders and recommendations

## 2015-02-11 ENCOUNTER — Other Ambulatory Visit: Payer: Self-pay | Admitting: Internal Medicine

## 2015-02-12 ENCOUNTER — Ambulatory Visit (INDEPENDENT_AMBULATORY_CARE_PROVIDER_SITE_OTHER): Payer: MEDICARE | Admitting: Internal Medicine

## 2015-02-12 ENCOUNTER — Encounter: Payer: Self-pay | Admitting: Internal Medicine

## 2015-02-12 VITALS — BP 132/76 | HR 60 | Temp 97.9°F | Resp 14 | Ht 66.0 in | Wt 149.8 lb

## 2015-02-12 DIAGNOSIS — J310 Chronic rhinitis: Secondary | ICD-10-CM | POA: Diagnosis not present

## 2015-02-12 DIAGNOSIS — R05 Cough: Secondary | ICD-10-CM | POA: Diagnosis not present

## 2015-02-12 DIAGNOSIS — R195 Other fecal abnormalities: Secondary | ICD-10-CM

## 2015-02-12 DIAGNOSIS — R059 Cough, unspecified: Secondary | ICD-10-CM

## 2015-02-12 MED ORDER — BENZONATATE 200 MG PO CAPS: 200.0000 mg | ORAL_CAPSULE | Freq: Three times a day (TID) | ORAL | Status: DC | PRN

## 2015-02-12 MED ORDER — PREDNISONE 10 MG PO TABS: ORAL_TABLET | ORAL | Status: DC

## 2015-02-12 NOTE — Patient Instructions (Signed)
Please take a probiotic , Florastor OR Align, every day if the bowels are loose. This will replace the normal bacteria which  are necessary for formation of normal stool and processing of food. 

## 2015-02-12 NOTE — Progress Notes (Signed)
Pre visit review using our clinic review tool, if applicable. No additional management support is needed unless otherwise documented below in the visit note. 

## 2015-02-12 NOTE — Progress Notes (Signed)
   Subjective:    Patient ID: Jack Noble, male    DOB: 16-Apr-1929, 81 y.o.   MRN: [id number]  HPI He continues to have intermittent cough with production of clear plugs up to twice a day. He also has postnasal drainage. He has no other signs of upper respiratory tract infection.  He did take an antibiotic within the last month from his dermatologist for some skin infection.  He has had intermittent abdominal discomfort with loose stools.   Review of Systems Frontal headache, facial pain , nasal purulence, dental pain, sore throat , otic pain or otic discharge denied. No fever , chills or sweats. Extrinsic symptoms of itchy, watery eyes, sneezing, or angioedema are denied. There is no wheezing or  paroxysmal nocturnal dyspnea.      Objective:   Physical Exam  General appearance:Adequately nourished; no acute distress or increased work of breathing is present.   BMI:  Lymphatic: No  lymphadenopathy about the head, neck, or axilla .  Eyes: No conjunctival inflammation or lid edema is present. There is no scleral icterus.  Ears:  External ear exam shows no significant lesions or deformities.  Otoscopic examination reveals clear canals, tympanic membranes are intact bilaterally without bulging, retraction, inflammation or discharge.  Nose:  External nasal examination shows no deformity or inflammation. Minimal erythema with slight debris on the left; no purulence present. No septal dislocation or deviation.No obstruction to airflow.   Oral exam: Dental hygiene is good; lips and gums are healthy appearing.There is no oropharyngeal erythema or exudate .  Neck:  No deformities, thyromegaly, masses, or tenderness noted.   Supple with full range of motion without pain.   Heart:  Normal rate and regular rhythm. S1 and S2 normal without gallop, murmur, click, rub or other extra sounds.   Lungs:Chest clear to auscultation; no wheezes, rhonchi,rales ,or rubs present. Intermittent  nonproductive cough.  Active bowel sounds; no tenderness or masses. Midline ventral hernia present  Extremities:  No cyanosis, edema, or clubbing  noted    Skin: Warm & dry w/o tenting or jaundice. No significant lesions or rash.       Assessment & Plan:  #1 nonallergic rhinitis with cough  #2 loose stool in the context of recent antibiotics  Plan: See orders and after visit summary.

## 2015-03-13 ENCOUNTER — Other Ambulatory Visit (INDEPENDENT_AMBULATORY_CARE_PROVIDER_SITE_OTHER): Payer: MEDICARE

## 2015-03-13 ENCOUNTER — Ambulatory Visit (INDEPENDENT_AMBULATORY_CARE_PROVIDER_SITE_OTHER): Payer: MEDICARE | Admitting: Internal Medicine

## 2015-03-13 ENCOUNTER — Encounter: Payer: Self-pay | Admitting: Internal Medicine

## 2015-03-13 VITALS — BP 148/82 | HR 58 | Temp 97.6°F | Ht 66.0 in | Wt 150.5 lb

## 2015-03-13 DIAGNOSIS — R5383 Other fatigue: Secondary | ICD-10-CM

## 2015-03-13 DIAGNOSIS — R109 Unspecified abdominal pain: Secondary | ICD-10-CM

## 2015-03-13 LAB — CBC WITH DIFFERENTIAL/PLATELET
Basophils Absolute: 0 10*3/uL (ref 0.0–0.1)
Basophils Relative: 0.3 % (ref 0.0–3.0)
Eosinophils Absolute: 0.1 10*3/uL (ref 0.0–0.7)
Eosinophils Relative: 1.7 % (ref 0.0–5.0)
HCT: 40.7 % (ref 39.0–52.0)
Hemoglobin: 14 g/dL (ref 13.0–17.0)
Lymphocytes Relative: 24.7 % (ref 12.0–46.0)
Lymphs Abs: 1.6 10*3/uL (ref 0.7–4.0)
MCHC: 34.4 g/dL (ref 30.0–36.0)
MCV: 95.3 fl (ref 78.0–100.0)
Monocytes Absolute: 0.5 10*3/uL (ref 0.1–1.0)
Monocytes Relative: 7.4 % (ref 3.0–12.0)
Neutro Abs: 4.2 10*3/uL (ref 1.4–7.7)
Neutrophils Relative %: 65.9 % (ref 43.0–77.0)
Platelets: 216 10*3/uL (ref 150.0–400.0)
RBC: 4.28 Mil/uL (ref 4.22–5.81)
RDW: 13.6 % (ref 11.5–15.5)
WBC: 6.4 10*3/uL (ref 4.0–10.5)

## 2015-03-13 LAB — BASIC METABOLIC PANEL WITH GFR
BUN: 11 mg/dL (ref 6–23)
CO2: 29 meq/L (ref 19–32)
Calcium: 9.8 mg/dL (ref 8.4–10.5)
Chloride: 104 meq/L (ref 96–112)
Creatinine, Ser: 0.66 mg/dL (ref 0.40–1.50)
GFR: 122.17 mL/min
Glucose, Bld: 98 mg/dL (ref 70–99)
Potassium: 4 meq/L (ref 3.5–5.1)
Sodium: 138 meq/L (ref 135–145)

## 2015-03-13 LAB — TSH: TSH: 1.47 u[IU]/mL (ref 0.35–4.50)

## 2015-03-13 NOTE — Patient Instructions (Signed)
Reflux of gastric acid may be asymptomatic as this may occur mainly during sleep.The triggers for reflux  include stress; the "aspirin family" ; peppermint; and caffeine (coffee, tea, cola, and chocolate). The aspirin family would include aspirin and the nonsteroidal agents such as ibuprofen &  Naproxen. Tylenol would not cause reflux. If having symptoms ; food & drink should be avoided for @ least 2 hours before going to bed. If symptoms progress; take Ranitidine 150 mg before breakfast & eve meal.  Please take a probiotic , Florastor OR Align, every day ONLY if the bowels are loose. This will replace the normal bacteria which  are necessary for formation of normal stool and processing of food.

## 2015-03-13 NOTE — Progress Notes (Signed)
Pre visit review using our clinic review tool, if applicable. No additional management support is needed unless otherwise documented below in the visit note. 

## 2015-03-13 NOTE — Progress Notes (Signed)
   Subjective:    Patient ID: Jack Noble, male    DOB: 20-Jul-1929, 81 y.o.   MRN: [id number]  HPI He's had stomach cramps over the last month intermittently. These typically occur 2 times a week once a day and last a few minutes. These have responded to over-the-counter gas relief product. He thought chocolate had triggered  this. He cannot  Associate it with any other foods. He does not drink or smoke. He does take a baby aspirin each morning. He does not ingest peppermint.  He had been taking probiotic over the past month but has stopped this  He's had fatigue intermittently with activity. There is no reported sleep disruption. He has no constitutional symptoms or GI symptoms otherwise.     Review of Systems Unexplained weight loss, significant dyspepsia, dysphagia, melena, rectal bleeding, or persistently small caliber stools are denied.     Objective:   Physical Exam Pertinent or positive findings include: Pattern alopecia is present.  He has bilateral ptosis.  Lipomatosis changes are noted subcutaneously in the mid abdomen measuring 10 x 11 cm.  He has a ventral/incisional hernia when he sits up.  Pedal pulses are slightly decreased.  General appearance :adequately nourished; in no distress. Eyes: No conjunctival inflammation or scleral icterus is present. Oral exam:  Lips and gums are healthy appearing.There is no oropharyngeal erythema or exudate noted. Dental hygiene is good. Heart:  Normal rate and regular rhythm. S1 and S2 normal without gallop, murmur, click, rub or other extra sounds   Lungs:Chest clear to auscultation; no wheezes, rhonchi,rales ,or rubs present.No increased work of breathing.  Abdomen: bowel sounds normal, soft and non-tender without masses, or organomegaly noted.  No guarding or rebound.  Vascular : all pulses equal ; no bruits present. Skin:Warm & dry.  Intact without suspicious lesions or rashes ; no tenting or jaundice  Lymphatic: No  lymphadenopathy is noted about the head, neck, axilla Neuro: Strength, tone & DTRs normal.        Assessment & Plan:  #1 abdominal cramps  #2 fatigue  Plan: See orders and recommendations

## 2015-03-14 ENCOUNTER — Ambulatory Visit: Payer: PRIVATE HEALTH INSURANCE | Admitting: Internal Medicine

## 2015-04-15 DIAGNOSIS — B079 Viral wart, unspecified: Secondary | ICD-10-CM | POA: Diagnosis not present

## 2015-04-15 DIAGNOSIS — L570 Actinic keratosis: Secondary | ICD-10-CM | POA: Diagnosis not present

## 2015-05-01 ENCOUNTER — Ambulatory Visit (INDEPENDENT_AMBULATORY_CARE_PROVIDER_SITE_OTHER): Payer: MEDICARE | Admitting: Internal Medicine

## 2015-05-01 ENCOUNTER — Other Ambulatory Visit (INDEPENDENT_AMBULATORY_CARE_PROVIDER_SITE_OTHER): Payer: MEDICARE

## 2015-05-01 ENCOUNTER — Encounter: Payer: Self-pay | Admitting: Internal Medicine

## 2015-05-01 VITALS — BP 152/78 | HR 63 | Temp 97.6°F | Resp 16 | Wt 161.0 lb

## 2015-05-01 DIAGNOSIS — R350 Frequency of micturition: Secondary | ICD-10-CM | POA: Diagnosis not present

## 2015-05-01 DIAGNOSIS — R1031 Right lower quadrant pain: Secondary | ICD-10-CM

## 2015-05-01 DIAGNOSIS — R1032 Left lower quadrant pain: Secondary | ICD-10-CM

## 2015-05-01 DIAGNOSIS — H18453 Nodular corneal degeneration, bilateral: Secondary | ICD-10-CM | POA: Diagnosis not present

## 2015-05-01 DIAGNOSIS — F411 Generalized anxiety disorder: Secondary | ICD-10-CM

## 2015-05-01 DIAGNOSIS — H4052X1 Glaucoma secondary to other eye disorders, left eye, mild stage: Secondary | ICD-10-CM | POA: Diagnosis not present

## 2015-05-01 LAB — CBC WITH DIFFERENTIAL/PLATELET
BASOS PCT: 0.5 % (ref 0.0–3.0)
Basophils Absolute: 0 10*3/uL (ref 0.0–0.1)
EOS ABS: 0.1 10*3/uL (ref 0.0–0.7)
EOS PCT: 1.1 % (ref 0.0–5.0)
HCT: 44 % (ref 39.0–52.0)
HEMOGLOBIN: 14.9 g/dL (ref 13.0–17.0)
Lymphocytes Relative: 25.1 % (ref 12.0–46.0)
Lymphs Abs: 1.9 10*3/uL (ref 0.7–4.0)
MCHC: 33.9 g/dL (ref 30.0–36.0)
MCV: 97.2 fl (ref 78.0–100.0)
MONO ABS: 0.7 10*3/uL (ref 0.1–1.0)
MONOS PCT: 8.7 % (ref 3.0–12.0)
NEUTROS ABS: 4.9 10*3/uL (ref 1.4–7.7)
Neutrophils Relative %: 64.6 % (ref 43.0–77.0)
PLATELETS: 215 10*3/uL (ref 150.0–400.0)
RBC: 4.53 Mil/uL (ref 4.22–5.81)
RDW: 13.1 % (ref 11.5–15.5)
WBC: 7.5 10*3/uL (ref 4.0–10.5)

## 2015-05-01 LAB — URINALYSIS, ROUTINE W REFLEX MICROSCOPIC
Bilirubin Urine: NEGATIVE
HGB URINE DIPSTICK: NEGATIVE
KETONES UR: NEGATIVE
Nitrite: NEGATIVE
PH: 7 (ref 5.0–8.0)
RBC / HPF: NONE SEEN (ref 0–?)
Specific Gravity, Urine: 1.01 (ref 1.000–1.030)
Total Protein, Urine: NEGATIVE
Urine Glucose: NEGATIVE
Urobilinogen, UA: 0.2 (ref 0.0–1.0)

## 2015-05-01 NOTE — Patient Instructions (Signed)
  Your next office appointment will be determined based upon review of your pending labs. Those written interpretation of the lab results and instructions will be transmitted to you by mail for your records.  Critical results will be called.   Followup as needed for any active or acute issue. Please report any significant change in your symptoms. 

## 2015-05-01 NOTE — Progress Notes (Signed)
Pre visit review using our clinic review tool, if applicable. No additional management support is needed unless otherwise documented below in the visit note. 

## 2015-05-01 NOTE — Progress Notes (Signed)
   Subjective:    Patient ID: Jack Noble, male    DOB: 04-10-1929, 81 y.o.   MRN: [id number]  HPI  He describes intermittent bilateral lower abdominal pain and suprapubic pain over the last month. This typically occurs after he is supine at night. It will typically resolve after 10-15 minutes without any intervention. When present it is described as sharp and 5 or less in intensity. It is increased by direct pressure on these areas.  He has intermittent urinary frequency and nocturia on average twice nightly but no other genitourinary or GI symptoms.  He admits that her major concern is his age of 19 and possibly leaving his wife. He does not want to take any antidepressant medication. He has a strong spiritual support base.  Review of Systems Dysuria, pyuria, hematuria, or polyuria are denied. Unexplained weight loss, abdominal pain, significant dyspepsia, dysphagia, melena, rectal bleeding, or persistently small caliber stools are denied.    Objective:   Physical Exam  Pertinent or positive findings include: Pattern alopecia present. Tenting of the skin is present. He has scattered senile keratoses. There is a large ventral hernia in the upper abdomen. He became somewhat tearful as he discussed his concerns of dying.   General appearance :adequately nourished; in no distress.  Eyes: No conjunctival inflammation or scleral icterus is present.  Oral exam:  Lips and gums are healthy appearing.There is no oropharyngeal erythema or exudate noted. Dental hygiene is good.  Heart:  Normal rate and regular rhythm. S1 and S2 normal without gallop, murmur, click, rub or other extra sounds    Lungs:Chest clear to auscultation; no wheezes, rhonchi,rales ,or rubs present.No increased work of breathing.   Abdomen: bowel sounds normal, soft and non-tender without masses, or organomegaly  noted.  No guarding or rebound. No flank tenderness to percussion.  Vascular : all pulses equal ; no  bruits present.  Skin:Warm & dry.  Intact without suspicious lesions or rashes ; no tenting or jaundice   Lymphatic: No lymphadenopathy is noted about the head, neck, axilla   Neuro: Strength, tone & DTRs normal.        Assessment & Plan:  #1 intermittent lower abdominal and suprapubic pain  #2 urinary frequency  #3 anxiety; I recommended he discuss his concerns with his minister  Plan: See orders

## 2015-05-02 ENCOUNTER — Ambulatory Visit: Payer: PRIVATE HEALTH INSURANCE | Admitting: Internal Medicine

## 2015-05-02 LAB — URINE CULTURE
Colony Count: NO GROWTH
Organism ID, Bacteria: NO GROWTH

## 2015-05-13 ENCOUNTER — Encounter: Payer: Self-pay | Admitting: Internal Medicine

## 2015-05-13 ENCOUNTER — Ambulatory Visit (INDEPENDENT_AMBULATORY_CARE_PROVIDER_SITE_OTHER)
Admission: RE | Admit: 2015-05-13 | Discharge: 2015-05-13 | Disposition: A | Discharge status: Home / Self Care | Payer: MEDICARE | Source: Ambulatory Visit | Attending: Internal Medicine | Admitting: Internal Medicine

## 2015-05-13 ENCOUNTER — Ambulatory Visit (INDEPENDENT_AMBULATORY_CARE_PROVIDER_SITE_OTHER): Payer: MEDICARE | Admitting: Internal Medicine

## 2015-05-13 VITALS — BP 135/88 | HR 56 | Temp 97.5°F | Wt 149.0 lb

## 2015-05-13 DIAGNOSIS — R1010 Upper abdominal pain, unspecified: Secondary | ICD-10-CM | POA: Diagnosis not present

## 2015-05-13 DIAGNOSIS — K429 Umbilical hernia without obstruction or gangrene: Secondary | ICD-10-CM | POA: Diagnosis not present

## 2015-05-13 DIAGNOSIS — K439 Ventral hernia without obstruction or gangrene: Secondary | ICD-10-CM

## 2015-05-13 DIAGNOSIS — K4020 Bilateral inguinal hernia, without obstruction or gangrene, not specified as recurrent: Secondary | ICD-10-CM | POA: Diagnosis not present

## 2015-05-13 NOTE — Patient Instructions (Signed)
Stay on clear liquids for until CAT scan done.This would include  jello, sherbert (NOT ice cream), Lipton's chicken noodle soup(NOT cream based soups),Gatorade Lite, flat Ginger ale (without High Fructose Corn Syrup),dry toast or crackers, baked potato.No milk , dairy or grease.

## 2015-05-13 NOTE — Progress Notes (Signed)
   Subjective:    Patient ID: Jack Noble, male    DOB: 1929/09/30, 81 y.o.   MRN: [id number]  HPI 2 weeks ago he lifted a case of water at the store and has had intermittent midabdominal pain since. It is described a sharp up to level VIII and nonradiating. It can last all day. It is improved at night. There is also a question that ranitidine may have improved it.   Review of Systems Unexplained weight loss,constipation, diarrhea, significant dyspepsia, dysphagia, melena, rectal bleeding, or persistently small caliber stools are denied.     Objective:   Physical Exam  Pertinent or positive findings include: His head is shaven. There is a large, 9 x 11 cm ventral hernia which is tender and nonreducible. Bowel sounds are decreased. Slight tenting suggested. He has great deal of difficulty with word retrieval relying on his wife to complete sentences and provide history.  General appearance :adequately nourished; in no distress.  Eyes: No conjunctival inflammation or scleral icterus is present.  Oral exam:  Lips and gums are healthy appearing.There is no oropharyngeal erythema or exudate noted. Dental hygiene is good.  Heart:  Normal rate and regular rhythm. S1 and S2 normal without gallop, murmur, click, rub or other extra sounds    Lungs:Chest clear to auscultation; no wheezes, rhonchi,rales ,or rubs present.No increased work of breathing.   Vascular : all pulses equal ; no bruits present.  Skin:Warm & dry.  Intact without suspicious lesions or rashes ; no jaundice   Lymphatic: No lymphadenopathy is noted about the head, neck, axilla   Neuro: Strength, tone decreased      Assessment & Plan:  #1 abdominal pain  #2 nonreducible ventral hernia  Plan: See orders/ recommendations

## 2015-05-13 NOTE — Progress Notes (Signed)
Pre visit review using our clinic review tool, if applicable. No additional management support is needed unless otherwise documented below in the visit note. 

## 2015-05-30 ENCOUNTER — Encounter: Payer: MEDICARE | Admitting: Internal Medicine

## 2015-05-31 DIAGNOSIS — H18453 Nodular corneal degeneration, bilateral: Secondary | ICD-10-CM | POA: Diagnosis not present

## 2015-05-31 DIAGNOSIS — Z961 Presence of intraocular lens: Secondary | ICD-10-CM | POA: Diagnosis not present

## 2015-05-31 DIAGNOSIS — H4052X1 Glaucoma secondary to other eye disorders, left eye, mild stage: Secondary | ICD-10-CM | POA: Diagnosis not present

## 2015-06-05 ENCOUNTER — Encounter: Payer: Self-pay | Admitting: Internal Medicine

## 2015-06-05 ENCOUNTER — Ambulatory Visit (INDEPENDENT_AMBULATORY_CARE_PROVIDER_SITE_OTHER): Payer: MEDICARE | Admitting: Internal Medicine

## 2015-06-05 VITALS — BP 138/78 | HR 59 | Temp 97.7°F | Resp 16 | Ht 66.0 in | Wt 148.0 lb

## 2015-06-05 DIAGNOSIS — K219 Gastro-esophageal reflux disease without esophagitis: Secondary | ICD-10-CM

## 2015-06-05 DIAGNOSIS — Z23 Encounter for immunization: Secondary | ICD-10-CM | POA: Diagnosis not present

## 2015-06-05 DIAGNOSIS — C4490 Unspecified malignant neoplasm of skin, unspecified: Secondary | ICD-10-CM

## 2015-06-05 DIAGNOSIS — R4701 Aphasia: Secondary | ICD-10-CM

## 2015-06-05 DIAGNOSIS — R030 Elevated blood-pressure reading, without diagnosis of hypertension: Secondary | ICD-10-CM

## 2015-06-05 NOTE — Progress Notes (Signed)
Pre visit review using our clinic review tool, if applicable. No additional management support is needed unless otherwise documented below in the visit note. 

## 2015-06-05 NOTE — Patient Instructions (Signed)
Reflux of gastric acid may be asymptomatic as this may occur mainly during sleep.The triggers for reflux  include stress; the "aspirin family" ; alcohol; peppermint; and caffeine (coffee, tea, cola, and chocolate). The aspirin family would include aspirin and the nonsteroidal agents such as ibuprofen &  Naproxen. Tylenol would not cause reflux. If having symptoms ; food & drink should be avoided for @ least 2 hours before going to bed.  

## 2015-06-05 NOTE — Progress Notes (Signed)
   Subjective:    Patient ID: Jack Noble, male    DOB: [birth year], 81 y.o.   MRN: [id number]  HPI The patient is here to assess status of active health conditions.  PMH, FH, & Social History reviewed & updated. He is on a heart healthy, low-salt diet. He is not exercising at this time  Abdominal cramping has resolved; he actually believes it was worse with the ranitidine.  He does have nocturia 1-2 times per night. He is continuing his finasteride. He has an appointment to see Dr Denna Haggard concerning scalp lesions.  Extensive labs which are current were reviewed.  Review of Systems Chest pain, palpitations, tachycardia, exertional dyspnea, paroxysmal nocturnal dyspnea, claudication or edema are absent. No unexplained weight loss, abdominal pain, significant dyspepsia, dysphagia, melena, rectal bleeding, or persistently small caliber stools. Dysuria, pyuria, hematuria, frequency, or polyuria are denied. Change in hair, skin, nails denied. No bowel changes of constipation or diarrhea. No intolerance to heat or cold.     Objective:   Physical Exam  Pertinent or positive findings include: His head is shaven. He has two irregular scalp lesions to the left of midline which are worrisome for malignancy. Heart rate is slow but regular. He has a large ventral/incisional hernia slightly to the right of midline. Crepitus of the knees is present. He has marked difficulty with word retrieval. Clinically this appears to be progressive  General appearance :adequately nourished; in no distress.  Eyes: No conjunctival inflammation or scleral icterus is present.  Oral exam:  Lips and gums are healthy appearing.There is no oropharyngeal erythema or exudate noted. Dental hygiene is good.  Heart:  Normal rate and regular rhythm. S1 and S2 normal without gallop, murmur, click, rub or other extra sounds    Lungs:Chest clear to auscultation; no wheezes, rhonchi,rales ,or rubs present.No increased  work of breathing.   Abdomen: bowel sounds normal, soft and non-tender without masses, or organomegaly noted.  No guarding or rebound.   Vascular : all pulses equal ; no bruits present.  Skin:Warm & dry.  Intact without suspicious lesions or rashes ; no tenting or jaundice   Lymphatic: No lymphadenopathy is noted about the head, neck, axilla  GU as per Urology   Neuro: Strength generally decreasd        Assessment & Plan:  See Current Assessment & Plan in Problem List under specific Diagnosis

## 2015-06-06 DIAGNOSIS — C4442 Squamous cell carcinoma of skin of scalp and neck: Secondary | ICD-10-CM | POA: Diagnosis not present

## 2015-06-06 DIAGNOSIS — K219 Gastro-esophageal reflux disease without esophagitis: Secondary | ICD-10-CM | POA: Insufficient documentation

## 2015-06-06 NOTE — Assessment & Plan Note (Signed)
Antireflux measures Stay off Ranitidine

## 2015-06-06 NOTE — Assessment & Plan Note (Signed)
Blood pressure goals reviewed. BMET current

## 2015-06-06 NOTE — Assessment & Plan Note (Signed)
2 suspicious lesions of scalp; appt pending

## 2015-06-06 NOTE — Assessment & Plan Note (Signed)
Neuro reassessment

## 2015-07-16 ENCOUNTER — Ambulatory Visit (INDEPENDENT_AMBULATORY_CARE_PROVIDER_SITE_OTHER): Payer: MEDICARE | Admitting: Internal Medicine

## 2015-07-16 ENCOUNTER — Encounter: Payer: Self-pay | Admitting: Internal Medicine

## 2015-07-16 VITALS — BP 138/88 | HR 62 | Temp 97.8°F | Resp 16 | Wt 150.0 lb

## 2015-07-16 DIAGNOSIS — Z23 Encounter for immunization: Secondary | ICD-10-CM | POA: Diagnosis not present

## 2015-07-16 DIAGNOSIS — R634 Abnormal weight loss: Secondary | ICD-10-CM

## 2015-07-16 NOTE — Progress Notes (Signed)
   Subjective:    Patient ID: Jack Noble, male    DOB: 1929/08/25, 81 y.o.   MRN: [id number]  HPI   They're concerned as church members have expressed concerns about him losing weight. They've been weighing on the same scales once a week and his weight remains stable at approximately 144. He has no constitutional or GI symptoms except for occasional gas for which he takes simethicone with relief. He does have nocturia 1-2 times per night.  Review of the vital signs flowsheets indicates over the last year his weight has varied from 148-161 with the lowest being 148 in July of this year. Typically the weights are in range of 150-155 as is noted today.  His thyroid function test were normal in April this year. CBC and differential was normal 05/01/15.  Review of Systems  Chest pain, palpitations, tachycardia, exertional dyspnea, paroxysmal nocturnal dyspnea, claudication or edema are absent. No  abdominal pain, significant dyspepsia, dysphagia, melena, rectal bleeding, or persistently small caliber stools. Dysuria, pyuria, hematuria, frequency,  or polyuria are denied. Change in hair, skin, nails denied. No bowel changes of constipation or diarrhea. No intolerance to heat or cold.No coke colored urine or clay colored stool.     Objective:   Physical Exam Pertinent or positive findings include:  Alopecia totalis is present. He has a large ventral/ incisional hernia. The expressive aphasia persists.  General appearance :adequately nourished; in no distress.  Eyes: No conjunctival inflammation or scleral icterus is present.  Oral exam:  Lips and gums are healthy appearing.There is no oropharyngeal erythema or exudate noted. Dental hygiene is good.  Heart:  Normal rate and regular rhythm. S1 and S2 normal without gallop, murmur, click, rub or other extra sounds    Lungs:Chest clear to auscultation; no wheezes, rhonchi,rales ,or rubs present.No increased work of breathing.   Abdomen:  bowel sounds normal, soft and non-tender without masses, or organomegaly noted.  No guarding or rebound.  Vascular : all pulses equal ; no bruits present.  Skin:Warm & dry.  Intact without suspicious lesions or rashes ; no tenting or jaundice   Lymphatic: No lymphadenopathy is noted about the head, neck, axilla.   Neuro: Strength, tone normal.         Assessment & Plan:  #1 subjective weight loss,  significant weight lossnot documented.  At this time will continue the weight monitor weekly without additional labs unless there is documented weight loss.

## 2015-07-16 NOTE — Patient Instructions (Signed)
I am very pleased with the way you are monitoring your weight at home. Continue that program of weighing once a week on the same day each week on the same scales.

## 2015-07-16 NOTE — Progress Notes (Signed)
Pre visit review using our clinic review tool, if applicable. No additional management support is needed unless otherwise documented below in the visit note. 

## 2015-08-06 ENCOUNTER — Ambulatory Visit (INDEPENDENT_AMBULATORY_CARE_PROVIDER_SITE_OTHER): Payer: MEDICARE

## 2015-08-06 DIAGNOSIS — Z23 Encounter for immunization: Secondary | ICD-10-CM | POA: Diagnosis not present

## 2015-08-14 ENCOUNTER — Encounter: Payer: Self-pay | Admitting: Internal Medicine

## 2015-08-14 ENCOUNTER — Other Ambulatory Visit (INDEPENDENT_AMBULATORY_CARE_PROVIDER_SITE_OTHER): Payer: MEDICARE

## 2015-08-14 ENCOUNTER — Other Ambulatory Visit: Payer: Self-pay | Admitting: Emergency Medicine

## 2015-08-14 ENCOUNTER — Ambulatory Visit (INDEPENDENT_AMBULATORY_CARE_PROVIDER_SITE_OTHER): Payer: MEDICARE | Admitting: Internal Medicine

## 2015-08-14 VITALS — BP 150/80 | HR 51 | Temp 98.1°F | Resp 16 | Wt 147.0 lb

## 2015-08-14 DIAGNOSIS — R634 Abnormal weight loss: Secondary | ICD-10-CM | POA: Diagnosis not present

## 2015-08-14 LAB — HEPATIC FUNCTION PANEL
ALBUMIN: 4 g/dL (ref 3.5–5.2)
ALT: 12 U/L (ref 0–53)
AST: 18 U/L (ref 0–37)
Alkaline Phosphatase: 38 U/L — ABNORMAL LOW (ref 39–117)
BILIRUBIN DIRECT: 0 mg/dL (ref 0.0–0.3)
TOTAL PROTEIN: 6.9 g/dL (ref 6.0–8.3)
Total Bilirubin: 0.5 mg/dL (ref 0.2–1.2)

## 2015-08-14 LAB — CBC WITH DIFFERENTIAL/PLATELET
BASOS PCT: 0.7 % (ref 0.0–3.0)
Basophils Absolute: 0 10*3/uL (ref 0.0–0.1)
EOS ABS: 0.1 10*3/uL (ref 0.0–0.7)
EOS PCT: 1.6 % (ref 0.0–5.0)
HEMATOCRIT: 41.1 % (ref 39.0–52.0)
Hemoglobin: 13.9 g/dL (ref 13.0–17.0)
Lymphocytes Relative: 26.3 % (ref 12.0–46.0)
Lymphs Abs: 1.5 10*3/uL (ref 0.7–4.0)
MCHC: 33.8 g/dL (ref 30.0–36.0)
MCV: 96.8 fl (ref 78.0–100.0)
Monocytes Absolute: 0.4 10*3/uL (ref 0.1–1.0)
Monocytes Relative: 7.4 % (ref 3.0–12.0)
NEUTROS ABS: 3.7 10*3/uL (ref 1.4–7.7)
Neutrophils Relative %: 64 % (ref 43.0–77.0)
PLATELETS: 201 10*3/uL (ref 150.0–400.0)
RBC: 4.25 Mil/uL (ref 4.22–5.81)
RDW: 13.4 % (ref 11.5–15.5)
WBC: 5.8 10*3/uL (ref 4.0–10.5)

## 2015-08-14 LAB — BASIC METABOLIC PANEL
BUN: 13 mg/dL (ref 6–23)
CHLORIDE: 104 meq/L (ref 96–112)
CO2: 33 meq/L — AB (ref 19–32)
CREATININE: 0.71 mg/dL (ref 0.40–1.50)
Calcium: 9.1 mg/dL (ref 8.4–10.5)
GFR: 112.18 mL/min (ref >60.00)
Glucose, Bld: 97 mg/dL (ref 70–99)
POTASSIUM: 4.1 meq/L (ref 3.5–5.1)
Sodium: 139 mEq/L (ref 135–145)

## 2015-08-14 LAB — TSH: TSH: 1.21 u[IU]/mL (ref 0.35–4.50)

## 2015-08-14 MED ORDER — FINASTERIDE 5 MG PO TABS: 5.0000 mg | ORAL_TABLET | Freq: Every day | ORAL | Status: DC

## 2015-08-14 NOTE — Progress Notes (Signed)
Pre visit review using our clinic review tool, if applicable. No additional management support is needed unless otherwise documented below in the visit note. 

## 2015-08-14 NOTE — Patient Instructions (Signed)
Continue weekly weights on your digital scale. Please complete the stool cards.

## 2015-08-14 NOTE — Progress Notes (Signed)
   Subjective:    Patient ID: Jack Noble, male    DOB: Nov 25, 1928, 81 y.o.   MRN: [id number]  HPI   They remain concerned that he is continuing to lose weight.  The digital weights for the last month taken each week have ranged from 142.6 to 142.2 most recently on their home scales.  His only symptom is some abdominal discomfort at the site of the ventral hernia if he wears a tight belt. Once he loosens the belt and lies supine that discomfort goes away.   Review of Systems Epistaxis, hemoptysis, hematuria, melena, or rectal bleeding denied. No significant dyspepsia,dysphagia, or abdominal pain.  There is no abnormal bruising , bleeding, or difficulty stopping bleeding with injury.  He denies any new changes in skin, nails, or hair. He has no visual changes of diplopia, vision loss, or blurred vision. No associated constipation or diarrhea. He denies tremor, anxiety, or depression.    Objective:   Physical Exam  Pertinent or positive findings include: Head is shaven. He exhibits the expressive aphasia. He has a large nonreducible ventral hernia with SQ lipomatous changes in the right abdomen.   General appearance :adequately nourished; in no distress.  Eyes: No conjunctival inflammation or scleral icterus is present.  Oral exam:  Lips and gums are healthy appearing.There is no oropharyngeal erythema or exudate noted. Dental hygiene is good.  Heart:  Normal rate and regular rhythm. S1 and S2 normal without gallop, murmur, click, rub or other extra sounds    Lungs:Chest clear to auscultation; no wheezes, rhonchi,rales ,or rubs present.No increased work of breathing.   Abdomen: bowel sounds normal, soft and non-tender without masses, or organomegaly  noted.  No guarding or rebound.   Vascular : all pulses equal ; no bruits present.  Skin:Warm & dry.  Intact without suspicious lesions or rashes ; no tenting or jaundice   Lymphatic: No lymphadenopathy is noted about the  head, neck, axilla.   Neuro: Strength, tone decreased. Gait is broad and slightly slapping.     Assessment & Plan:  #1 subjective weight loss  Plan: See orders and recommendations

## 2015-08-14 NOTE — Assessment & Plan Note (Addendum)
CBC and differential; stool cards;BMET; hepatic function; TSH

## 2015-09-19 ENCOUNTER — Encounter: Payer: Self-pay | Admitting: Internal Medicine

## 2015-09-19 ENCOUNTER — Ambulatory Visit (INDEPENDENT_AMBULATORY_CARE_PROVIDER_SITE_OTHER): Payer: MEDICARE | Admitting: Internal Medicine

## 2015-09-19 ENCOUNTER — Other Ambulatory Visit (INDEPENDENT_AMBULATORY_CARE_PROVIDER_SITE_OTHER): Payer: MEDICARE

## 2015-09-19 VITALS — BP 144/84 | HR 64 | Temp 97.8°F | Resp 16 | Wt 147.0 lb

## 2015-09-19 DIAGNOSIS — R4701 Aphasia: Secondary | ICD-10-CM

## 2015-09-19 DIAGNOSIS — R5383 Other fatigue: Secondary | ICD-10-CM

## 2015-09-19 DIAGNOSIS — R42 Dizziness and giddiness: Secondary | ICD-10-CM

## 2015-09-19 DIAGNOSIS — R413 Other amnesia: Secondary | ICD-10-CM | POA: Diagnosis not present

## 2015-09-19 LAB — VITAMIN B12: VITAMIN B 12: 559 pg/mL (ref 211–911)

## 2015-09-19 NOTE — Patient Instructions (Signed)
The Neurology referral will be scheduled and you'll be notified of the time.Please call the Referral Co-Ordinator @ [phone] if you have not been notified of appointment time within 7-10 days. 

## 2015-09-19 NOTE — Progress Notes (Signed)
Pre visit review using our clinic review tool, if applicable. No additional management support is needed unless otherwise documented below in the visit note. 

## 2015-09-19 NOTE — Progress Notes (Signed)
   Subjective:    Patient ID: Jack Noble, male    DOB: 1929/01/01, 81 y.o.   MRN: [id number]  HPI  A week ago he had a spell of "dizziness" while sitting at the breakfast table. This occurred within 20 minutes of eating oatmeal, fruit, cottage cheese, or cheese, coffee. There was no cardiac or neurologic prodrome prior to the event. He denied any associated sensory loss. He went to bed for 30 minutes and symptoms essentially resolved.  Since that time he's had some weakness and fatigue.  He had extensive labs done 08/14/15 which were normal except for slightly reduced alkaline phosphatase.   Review of Systems  Denied were any change in heart rhythm or rate prior to the event. There was no associated chest pain or shortness of breath .  Also specifically denied prior to the episode were headache, limb weakness, tingling, or numbness. No seizure activity noted.  Fever, chills, sweats, or unexplained weight loss not present. No significant headaches. Blurred vision , diplopia or vision loss absent. Vertigo, near syncope or imbalance denied. There is no numbness, tingling, or weakness in extremities.   No loss of control of bladder or bowels. Radicular type pain absent. No seizure stigmata.    Objective:   Physical Exam Pertinent or positive findings include: Head is shaven. Facies are masked. He has profound expressive aphasia. Bilateral ptosis is present. Large ventral hernia is present. Crepitus of the knees is noted. He gave the date as 10/28/?. He was unable to give the name of the President or [place]. Cranial nerve exam revealed no definite deficits.   General appearance :adequately nourished; in no distress.  Eyes: No conjunctival inflammation or scleral icterus is present.  Oral exam:  Lips and gums are healthy appearing.There is no oropharyngeal erythema or exudate noted. Dental hygiene is good.  Heart:  Normal rate and regular rhythm. S1 and S2 normal without  gallop, murmur, click, rub or other extra sounds    Lungs:Chest clear to auscultation; no wheezes, rhonchi,rales ,or rubs present.No increased work of breathing.   Abdomen: bowel sounds normal, soft and non-tender without masses, or organomegaly noted.  No guarding or rebound.  Vascular : all pulses equal ; no bruits present.  Skin:Warm & dry.  Intact without suspicious lesions or rashes ; no tenting or jaundice   Lymphatic: No lymphadenopathy is noted about the head, neck, axilla.   Neuro: Strength, tone & DTRs normal.      Assessment & Plan:  #1 paroxysmal dizziness without cardio neuro prodrome  #2 subjective weakness and fatigue; extensive lab studies negative  #3 expressive aphasia  #4 memory deficit  Plan: Reassessment by his Neurologist, Dr. Jannifer Franklin will be scheduled.

## 2015-09-20 LAB — RPR

## 2015-09-30 ENCOUNTER — Encounter: Payer: Self-pay | Admitting: Neurology

## 2015-09-30 ENCOUNTER — Ambulatory Visit (INDEPENDENT_AMBULATORY_CARE_PROVIDER_SITE_OTHER): Payer: MEDICARE | Admitting: Neurology

## 2015-09-30 VITALS — BP 157/79 | HR 80 | Ht 66.0 in | Wt 147.0 lb

## 2015-09-30 DIAGNOSIS — R4701 Aphasia: Secondary | ICD-10-CM | POA: Diagnosis not present

## 2015-09-30 NOTE — Patient Instructions (Addendum)
   We will check a CT of the brain, and get set up for speech therapy.   Aphasia Aphasia is damage to the part of your brain that you need to communicate. For most people, that area is on the left side of the brain. Aphasia does not affect your intelligence, but you may struggle to talk, understand speech, read, or write. Aphasia can happen to anyone at any age, but it is most common in older age. CAUSES  An interruption of blood supply to the brain (stroke) is the most common cause of aphasia. Any disease or disorder that damages the communication areas of the brain can cause aphasia. This includes:   Brain tumors.  Brain injuries.  Brain infections.  Progressive diseases of the nervous system (neurological disorders). RISK FACTORS You may be at risk for aphasia if you have had any trauma, disease, or disorder that damaged the communication areas of the brain. SIGNS AND SYMPTOMS  Aphasia may start suddenly if it is caused by a stroke or brain injury. Aphasia caused by a tumor or a progressive neurological disorder may start gradually. The condition affects people differently. Signs and symptoms of aphasia include:  Trouble finding the right word.  Using the wrong words.  Talking in sentences that do not make sense.  Making up words.  Being unable to understand other people's speech.  Having problems writing, spelling, or reading.  Having trouble with numbers.  Having trouble swallowing. DIAGNOSIS  Your health care provider may suspect you have aphasia if you lose the ability to speak or understand language. You may need to see a specialist (speech and language pathologist) to help determine the diagnosis of aphasia. This person may do a series of tests to check your ability to:  Speak.  Express ideas.  Make conversation.  Understand speech.  Read and write. TREATMENT  In some cases, aphasia may improve on its own over time. Treatment for aphasia usually involves  therapy with a pathologist. Your treatment will be designed to meet your needs and abilities. Common treatments include:  Speech therapy.  Learning other ways to communicate.  Working with family members to find the best ways to communicate.  Working with an occupational therapist to find ways to communicate at work. HOME CARE INSTRUCTIONS  Keep all follow-up appointments.  Make sure you have a good support system at home.  The following techniques may be helpful while communicating:  Use short, simple sentences. Ask family members to do the same. Sentences that require one-word answers are easiest.  Avoid distractions like background noise when trying to listen or talk.  Try communicating with gestures, pointing, or drawing.  Talk slowly. Ask family members to talk to you slowly.  Maintain eye contact when communicating. SEEK MEDICAL CARE IF:  Your symptoms change or get worse.  You need more support at home.  You are struggling with anxiety or depression.  You develop trouble swallowing.   This information is not intended to replace advice given to you by your health care provider. Make sure you discuss any questions you have with your health care provider.   Document Released: 08/01/2002 Document Revised: 11/30/2014 Document Reviewed: 01/29/2014 Elsevier Interactive Patient Education [employer].

## 2015-09-30 NOTE — Progress Notes (Signed)
Reason for visit: Aphasia  Referring physician: Dr. Gordy Councilman Jack Noble is a 81 y.o. male  History of present illness:  Jack Noble is an 81 year old left-handed white male with a history of chronic aphasia. The patient was last seen on 12/26/2013, he indicated that he began having issues with speech and mild memory problems after a hospitalization in September 2014 with a very high fever of 105. The patient was set up for a CT scan of the brain, but the patient did not wish to have a study done, and he has continued to have chronic problems with speech since that time. The patient was set up for speech therapy evaluation which seemed to help a little bit. The wife indicates that she does not believe the speech has changed over time, the patient does have a mild memory issue, but this has not altered either. The patient operates a motor vehicle without difficulty. He is able to manage the finances on his own, he keeps up with his own medications and appointments. The patient denies any headache, visual field changes, difficulty controlling the bowels or the bladder, or any numbness or weakness on the body. The patient sent over for further evaluation.  Past Medical History  Diagnosis Date  . Dermatophytosis of nail   . Hyperlipidemia   . Hearing loss   . Hyperplasia of prostate without urinary obstruction   . Cataracts, bilateral   . Rosacea     Dr. Marge Duncans  . Allergy   . Glaucoma     Dr. Edilia Bo  . Corneal abrasion     Dr. Ned Clines, [facility] Op[facility]  . Sepsis due to Escherichia coli (E. coli) ([place]) 05/2011    Morganella also cultured  . Diverticulosis   . TIA (transient ischemic attack) 9/6-05/2012    BP 221/99  . Epistaxis   . Aphasia 12/26/2013    Dr Jannifer Franklin  . History of GI diverticular bleed 710/15    Past Surgical History  Procedure Laterality Date  . Tonsillectomy and adenoidectomy    . Appendectomy      age 67  . Cataract extraction, bilateral  2003    Dr Ishmael Holter (now  seen @ [facility])  . Inguinal hernia repair      age 63  . Colonoscopy w/ polypectomy  06/2009    Dr.Jacobs; "miniscule polyp"  . Cholecystectomy  1978    stones  . Upper gi endoscopy  05/2014    negative  . Skin biopsy  12/2014    done by dermatologist     Family History  Problem Relation Age of Onset  . Stroke Mother 37  . Parkinsonism Brother     Brother died at age at age 65  . Pulmonary embolism Father 73    Post Op  . Diabetes Neg Hx   . Cancer Neg Hx   . COPD Neg Hx   . Heart disease Neg Hx     Social history:  reports that he has never smoked. He has never used smokeless tobacco. He reports that he does not drink alcohol or use illicit drugs.  Medications:  Prior to Admission medications   Medication Sig Start Date End Date Taking? Authorizing Provider  Cholecalciferol (VITAMIN D) 1000 UNITS capsule Take 1,000 Units by mouth every morning.    Yes Historical Provider, MD  finasteride (PROSCAR) 5 MG tablet Take 1 tablet (5 mg total) by mouth daily. 08/14/15  Yes Hendricks Limes, MD  ketoconazole (NIZORAL) 2 %  cream Apply 1 application topically 2 (two) times daily as needed for irritation. 05/07/14  Yes Hendricks Limes, MD  mometasone (ELOCON) 0.1 % ointment APPLY TOPICALLY TO AFFECTED AREA(S) TWICE DAILY 06/26/14  Yes Hendricks Limes, MD  montelukast (SINGULAIR) 10 MG tablet Take 1 tablet (10 mg total) by mouth at bedtime. 02/06/15  Yes Hendricks Limes, MD  Multiple Vitamin (MULTIVITAMIN WITH MINERALS) TABS Take 1 tablet by mouth every morning.    Yes Historical Provider, MD  nystatin-triamcinolone (MYCOLOG II) cream Apply 1 application topically daily as needed (Applies to red places in groin area.).   Yes Historical Provider, MD  simethicone (MYLICON) 80 MG chewable tablet Chew 1 tablet (80 mg total) by mouth every 6 (six) hours as needed for flatulence. 06/03/14  Yes Kinnie Feil, MD  Timolol Maleate (ISTALOL) 0.5 % (DAILY) SOLN Place 1 drop into the left eye 2 (two)  times daily at 8 am and 10 pm.    Yes Historical Provider, MD      Allergies  Allergen Reactions  . Amoxicillin     urticaria  . Codeine     Rash Because of a history of documented adverse serious drug reaction;Medi Alert bracelet  is recommended  . Minocycline Hcl     REACTION: nervous/chest pressure (Note: may have affected GE valve)  . Astelin [Azelastine Hcl]     Makes me jittery  . Ether     pneumonia  . Zocor [Simvastatin] Other (See Comments)    Itching w/o, muscle problems and it affected his stomach  . Metoprolol     lightheaded    ROS:  Out of a complete 14 system review of symptoms, the patient complains only of the following symptoms, and all other reviewed systems are negative.  Speech problems  Blood pressure 157/79, pulse 80, height 5\' 6"  (1.676 m), weight 147 lb (66.679 kg).  Physical Exam  General: The patient is alert and cooperative at the time of the examination.  Eyes: Pupils are equal, round, and reactive to light. Discs are flat bilaterally.  Neck: The neck is supple, no carotid bruits are noted.  Respiratory: The respiratory examination is clear.  Cardiovascular: The cardiovascular examination reveals a regular rate and rhythm, no obvious murmurs or rubs are noted.  Skin: Extremities are without significant edema.  Neurologic Exam  Mental status: The patient is alert and oriented x 3 at the time of the examination. The Mini-Mental Status Examination done today shows a total score of 17/30.  Cranial nerves: Facial symmetry is present. There is good sensation of the face to pinprick and soft touch bilaterally. The strength of the facial muscles and the muscles to head turning and shoulder shrug are normal bilaterally. Speech is well enunciated, but a significant aphasia is noted. Extraocular movements are full. Visual fields are full. The tongue is midline, and the patient has symmetric elevation of the soft palate. No obvious hearing deficits  are noted.  Motor: The motor testing reveals 5 over 5 strength of all 4 extremities. Good symmetric motor tone is noted throughout.  Sensory: Sensory testing is intact to pinprick, soft touch, vibration sensation, and position sense on all 4 extremities. No evidence of extinction is noted.  Coordination: Cerebellar testing reveals good finger-nose-finger and heel-to-shin bilaterally. The patient demonstrates some apraxia with the use of the lower extremities.  Gait and station: Gait is normal. Tandem gait is apraxic. Romberg is negative. No drift is seen.  Reflexes: Deep tendon reflexes are symmetric  and normal bilaterally. Toes are downgoing bilaterally.   Assessment/Plan:  1. Aphasia, chronic  2. Mild memory disturbance  According to the wife, the patient has had essentially no progression in his speech and memory issues. The patient clearly has a significant aphasia, he also demonstrates apraxias on the clinical examination. The patient is agreeable to having a CT scan of the head done, he will not have MRI. He has already had blood work that included and RPR and B12 level that were unremarkable. He will be set up for speech therapy at this time. I will follow-up in 6 months to reassess his cognitive functioning. Without progression of the aphasia, primary progressive aphasia is not likely.  Jill Alexanders MD 09/30/2015 6:12 PM  [facility] [address] [place],  [address]  Phone [phone] Fax [phone]

## 2015-10-07 ENCOUNTER — Other Ambulatory Visit: Payer: MEDICARE

## 2015-10-29 ENCOUNTER — Telehealth: Payer: Self-pay | Admitting: Neurology

## 2015-10-29 NOTE — Telephone Encounter (Signed)
I have spoken with Mrs. Smail this afternoon.  She expresses concern that between the CT in June, and Donn's pending CT, that he will have been exposed to too much radiation.  Pleasant conversation, reassurance provided./fim

## 2015-10-29 NOTE — Telephone Encounter (Signed)
Pt's wife called inquiring if CT w/contrast on abdomen was 6/16 and CT head w/out contrast on 11/04/15 is only 6 mths apart. Is it risky for pt to have those scans so close together.

## 2015-10-31 ENCOUNTER — Ambulatory Visit (INDEPENDENT_AMBULATORY_CARE_PROVIDER_SITE_OTHER): Payer: MEDICARE | Admitting: Internal Medicine

## 2015-10-31 ENCOUNTER — Encounter: Payer: Self-pay | Admitting: Internal Medicine

## 2015-10-31 VITALS — BP 134/68 | HR 63 | Temp 97.7°F | Resp 16 | Wt 146.0 lb

## 2015-10-31 DIAGNOSIS — S3992XA Unspecified injury of lower back, initial encounter: Secondary | ICD-10-CM

## 2015-10-31 DIAGNOSIS — I83029 Varicose veins of left lower extremity with ulcer of unspecified site: Secondary | ICD-10-CM

## 2015-10-31 DIAGNOSIS — L97929 Non-pressure chronic ulcer of unspecified part of left lower leg with unspecified severity: Principal | ICD-10-CM

## 2015-10-31 NOTE — Progress Notes (Signed)
   Subjective:    Patient ID: Jack Noble, male    DOB: [birth year], 81 y.o.   MRN: [id number]  HPI He feels varicose veins in the left calf medially and posteriorly are causing pain. There is no associated ulceration.  Also 3-4 days ago he was involved in a mechanical fall on the stairs hitting his low back. He has no associated numbness, tingling, weakness in extremities. He denies any hematuria. There was no cardiac or neurologic prodrome prior to the fall.  Review of Systems  Denied were any change in heart rhythm or rate prior to the event. There was no associated chest pain or shortness of breath .  Also specifically denied prior to the episode were headache, limb weakness, tingling, or numbness. No seizure activity noted.     Objective:   Physical Exam  Pertinent or positive findings include: His head is shaven. He has marked expressive aphasia. Heart sounds are somewhat distant. He has a ventral hernia in the area of previous surgery. There are large varicose veins over the left medial posterior calf which resolve in the supine position. Homans sign is negative. There is full range of motion lower extremities with negative straight leg raising. There is no discomfort to light percussion over the lumbosacral spine. There is no bruising noted in this area.  General appearance :adequately nourished; in no distress.  Eyes: No conjunctival inflammation or scleral icterus is present.  Oral exam:  Lips and gums are healthy appearing.There is no oropharyngeal erythema or exudate noted. Dental hygiene is good.  Heart:  Normal rate and regular rhythm. S1 and S2 normal without gallop, murmur, click, rub or other extra sounds    Lungs:Chest clear to auscultation; no wheezes, rhonchi,rales ,or rubs present.No increased work of breathing.   Abdomen: bowel sounds normal, soft and non-tender without masses, or organomegaly noted.  No guarding or rebound. No flank tenderness to  percussion.  Vascular : all pulses equal ; no bruits present.  Skin:Warm & dry.  Intact without suspicious lesions or rashes ; no tenting or jaundice   Lymphatic: No lymphadenopathy is noted about the head, neck, axilla.   Neuro: Strength, tone & DTRs normal.      Assessment & Plan:  #1 low back pain post fall. No evidence of complication  #2 varicose veins without ulceration.  Plan: See orders and after visit summary

## 2015-10-31 NOTE — Patient Instructions (Signed)
Wear over the calf support hose if you are standing for prolonged periods of  time or working on your feet for prolonged periods of time.  Use an anti-inflammatory cream such as Aspercreme or Zostrix cream twice a day to the affected area as needed. In lieu of this warm moist compresses or  hot water bottle can be used. Do not apply ice .

## 2015-10-31 NOTE — Progress Notes (Signed)
Patient received education resource, including the self-management goal and tool. Patient verbalized understanding. 

## 2015-11-01 ENCOUNTER — Other Ambulatory Visit: Payer: MEDICARE

## 2015-11-04 ENCOUNTER — Ambulatory Visit
Admission: RE | Admit: 2015-11-04 | Discharge: 2015-11-04 | Disposition: A | Discharge status: Home / Self Care | Payer: MEDICARE | Source: Ambulatory Visit | Attending: Neurology | Admitting: Neurology

## 2015-11-04 DIAGNOSIS — R4701 Aphasia: Secondary | ICD-10-CM

## 2015-11-05 ENCOUNTER — Telehealth: Payer: Self-pay | Admitting: Neurology

## 2015-11-05 NOTE — Telephone Encounter (Signed)
I called the patient, talk with wife. The CT of the brain does not show an overt stroke to explain aphasia. The episode began following a very high fever. The patient may have had an encephalitis of some sort. The patient is to enter speech therapy, we will need to follow the patient over time to look for progression of memory or speech issues.   CT head 11/04/15:  IMPRESSION: This is an abnormal CT scan of the head without contrast showing the following: 1. Mild to moderate cortical atrophy that has progressed since the CT scan dated 07/24/2013. 2. Chronic microvascular ischemic changes essentially unchanged when compared to the CT scan dated 07/24/2013. 3. No evidence of large vessel strokes 4. There are no acute findings.

## 2015-11-20 ENCOUNTER — Ambulatory Visit: Payer: MEDICARE

## 2015-11-21 ENCOUNTER — Telehealth: Payer: Self-pay | Admitting: Internal Medicine

## 2015-11-21 NOTE — Telephone Encounter (Signed)
OK X1 

## 2015-11-21 NOTE — Telephone Encounter (Signed)
Pt wife called in and said that pt needs a refill on nystatin-triamcinolone (MYCOLOG II) cream [id number]  Pharmacy on [address]

## 2015-11-22 MED ORDER — NYSTATIN-TRIAMCINOLONE 100000-0.1 UNIT/GM-% EX CREA: 1.0000 "application " | TOPICAL_CREAM | Freq: Every day | CUTANEOUS | Status: DC | PRN

## 2015-11-27 DIAGNOSIS — C4442 Squamous cell carcinoma of skin of scalp and neck: Secondary | ICD-10-CM | POA: Diagnosis not present

## 2015-11-28 ENCOUNTER — Telehealth: Payer: Self-pay

## 2015-11-28 NOTE — Telephone Encounter (Signed)
Left msg on vm to call back to establish with new PCP

## 2015-11-28 NOTE — Telephone Encounter (Signed)
PA received from pharmacy.   Pt needs to est with new PCP before a PA or alternative can be submitted. Sending to scheduling to address.

## 2015-12-04 DIAGNOSIS — H04123 Dry eye syndrome of bilateral lacrimal glands: Secondary | ICD-10-CM | POA: Diagnosis not present

## 2015-12-04 DIAGNOSIS — H18453 Nodular corneal degeneration, bilateral: Secondary | ICD-10-CM | POA: Diagnosis not present

## 2015-12-05 ENCOUNTER — Ambulatory Visit: Payer: MEDICARE | Attending: Neurology

## 2015-12-05 DIAGNOSIS — R4701 Aphasia: Secondary | ICD-10-CM | POA: Diagnosis not present

## 2015-12-06 NOTE — Patient Instructions (Signed)
We will work on situations that are especially frustrating for you and try to make them easier.

## 2015-12-06 NOTE — Therapy (Signed)
[facility] [address] [place], [place], [address] Phone: [phone]   Fax:  [phone]  Speech Language Pathology Evaluation  Patient Details  Name: Jack Noble MRN: [id number] Date of Birth: 1929/10/03 Referring Provider: Neldon Newport  Encounter Date: 12/05/2015      End of Session - 12/06/15 1530    Visit Number 1   Number of Visits 5   Date for SLP Re-Evaluation 02/03/16   SLP Start Time 1448   SLP Stop Time  1530   SLP Time Calculation (min) 42 min   Activity Tolerance Patient tolerated treatment well      Past Medical History  Diagnosis Date  . Dermatophytosis of nail   . Hyperlipidemia   . Hearing loss   . Hyperplasia of prostate without urinary obstruction   . Cataracts, bilateral   . Rosacea     Dr. Marge Duncans  . Allergy   . Glaucoma     Dr. Edilia Bo  . Corneal abrasion     Dr. Ned Clines, [facility] Op[facility]  . Sepsis due to Escherichia coli (E. coli) ([place]) 05/2011    Morganella also cultured  . Diverticulosis   . TIA (transient ischemic attack) 9/6-05/2012    BP 221/99  . Epistaxis   . Aphasia 12/26/2013    Dr Jannifer Franklin  . History of GI diverticular bleed 710/15    Past Surgical History  Procedure Laterality Date  . Tonsillectomy and adenoidectomy    . Appendectomy      age 82  . Cataract extraction, bilateral  2003    Dr Ishmael Holter (now seen @ [place])  . Inguinal hernia repair      age 6  . Colonoscopy w/ polypectomy  06/2009    Dr.Jacobs; "miniscule polyp"  . Cholecystectomy  1978    stones  . Upper gi endoscopy  05/2014    negative  . Skin biopsy  12/2014    done by dermatologist     There were no vitals filed for this visit.  Visit Diagnosis: Aphasia      Subjective Assessment - 12/05/15 1454    Subjective   "I know what I was doing, and I know what I was doing, and (2 seconds) no problem." (Pt relating that sometimes he's able to talk functionally) "They oughta have, doing better  than they are." (Pt thinks he should be doing better than he is)   Patient is accompained by: --  wife - Jack Noble   Currently in Pain? No/denies            SLP Evaluation [facility] - 12/05/15 1454    SLP Visit Information   SLP Received On 12/05/15   Referring Provider [place], [facility] [place] [facility] Aphasia   Prior Functional Status   Vocation Retired   Scientist, physiological Comprehension   Overall Auditory Comprehension Appears within functional limits for tasks assessed   Verbal Expression   Overall Verbal Expression Impaired at baseline   Initiation Impaired   Automatic Speech Day of week;Month of year;Counting  intact once pt understood task & SLP mitigated perseveration   Level of Generative/Spontaneous Verbalization Phrase   Naming Impairment   Divergent 50-74% accurate   Other Naming Comments More literal paraphasia during boston naming test than conversation   Effective Techniques Articulatory cues;Phonemic cues;Open ended questions   Non-Verbal Means of Communication Gestures   Written Expression   Dominant Hand Right   Oral Motor/Sensory Function   Overall Oral Motor/Sensory Function Appears  within functional limits for tasks assessed   Motor Speech   Overall Motor Speech Appears within functional limits for tasks assessed                         SLP Education - 12/06/15 1529    Education provided Yes   Education Details SLP thinks pt's deficit most mirrors Nonfluent/agrammatic varriant of Primary Progressive Aphasia   Person(s) Educated Patient;Spouse   Methods Explanation   Comprehension Verbalized understanding            SLP Long Term Goals - 12/06/15 1647    SLP LONG TERM GOAL #1   Title pt/wife will bring three linguistic settings/circumstances for SLP to assist in problem solving for more efficient communication   Time 4   Period Weeks   Status New   SLP LONG TERM GOAL #2   Title pt will demo multimodal communication 50% of the time in 6  opportunities in a session   Time 4   Period Weeks   Status New   SLP LONG TERM GOAL #3   Title pt will utilize some low-tech augmentative communication item for communication of desired message   Time 4   Period Weeks   Status New          Plan - 12/06/15 1531    Clinical Impression Statement Pt presents with s/s most mirroring 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 Pt with noted worsened (compared to September 2014) mild moderate cortical atrophy from imaging in December 2016. Pt's verbal expression skills have worsened since discharge in August 2015 from a previous therapy course. Simple conversation is now not functional. ST is recommended at this time to provide pt and wife some language tasks to do at home, compensatory measures to take to improve communication, and problem solve with SLP some specific situations most frustrating for pt, to improve pt and wife's QOL. Pt indicated a desire for ST once a week and SLP agreed this is most appropriate at this time.   Speech Therapy Frequency 1x /week   Duration 4 weeks   Treatment/Interventions Compensatory strategies;Internal/external aids;Patient/family education;Functional tasks;Cueing hierarchy;Multimodal communcation approach;SLP instruction and feedback   Potential to Achieve Goals Fair   Potential Considerations Severity of impairments;Previous level of function   Consulted and Agree with Plan of Care Patient;Family member/caregiver   Family Member Consulted wife          G-Codes - Dec 23, 2015 1651    Functional Assessment Tool Used noms- 3 :70% impaired    Functional Limitations Spoken language expressive   Spoken Language Expression Current Status [phone]) At least 60 percent but less than 80 percent impaired, limited or restricted   Spoken Language Expression Goal Status [id number]) At least 60 percent but less than 80 percent impaired, limited or restricted      Problem List Patient Active Problem List    Diagnosis Date Noted  . Fatigue 09/19/2015  . Loss of weight 08/14/2015  . GERD (gastroesophageal reflux disease) 06/06/2015  . Skin cancer 09/14/2014  . History of GI diverticular bleed 06/08/2014  . BRBPR (bright red blood per rectum) 06/01/2014  . Aphasia 12/26/2013  . History of short term memory loss 09/01/2013  . Anemia, unspecified 08/04/2013  . Hypoalbuminemia 08/04/2013  . Leukocytosis, unspecified 07/26/2013  . Thrombocytopenia, unspecified ([place]) 07/26/2013  . Severe sepsis ([place]) 07/24/2013  . BPH (benign prostatic hyperplasia) 07/24/2013  . Other and unspecified hyperlipidemia 05/18/2013  . Elevated blood pressure reading  without diagnosis of hypertension 07/30/2012  . Abnormal blood testing 03/02/2012  . Glaucoma   . Incisional hernia, with obstruction, without gangrene 08/25/2010  . REACTIVE HYPOGLYCEMIA 08/19/2010  . DIVERTICULOSIS, COLON 07/17/2009  . COLONIC POLYPS, HX OF 07/17/2009  . ALLERGIC RHINITIS 10/16/2008  . Other abnormal glucose 10/16/2008  . LIPOMAS, MULTIPLE 11/04/2007  . Rosacea 05/12/2007    Jack Noble , MS, CCC-SLP  12/06/2015, 4:53 PM  [facility] [address] [place], [place], [address] Phone: [phone]   Fax:  [phone]  Name: Jack Noble MRN: [id number] Date of Birth: December 28, 1928

## 2015-12-12 ENCOUNTER — Encounter: Payer: MEDICARE | Admitting: Speech Pathology

## 2015-12-20 ENCOUNTER — Ambulatory Visit: Payer: MEDICARE

## 2015-12-20 DIAGNOSIS — R4701 Aphasia: Secondary | ICD-10-CM | POA: Diagnosis not present

## 2015-12-20 NOTE — Therapy (Signed)
[facility] [address] [place], [place], [address] Phone: [phone]   Fax:  [phone]  Speech Language Pathology Treatment  Patient Details  Name: Jack Noble MRN: [id number] Date of Birth: 11-Mar-1929 Referring Provider: Neldon Newport  Encounter Date: 12/20/2015      End of Session - 12/20/15 1658    Visit Number 2   Number of Visits 5   Date for SLP Re-Evaluation 02/03/16   SLP Start Time [id number]   SLP Stop Time  1447   SLP Time Calculation (min) 45 min   Activity Tolerance Patient tolerated treatment well      Past Medical History  Diagnosis Date  . Dermatophytosis of nail   . Hyperlipidemia   . Hearing loss   . Hyperplasia of prostate without urinary obstruction   . Cataracts, bilateral   . Rosacea     Dr. Marge Duncans  . Allergy   . Glaucoma     Dr. Edilia Bo  . Corneal abrasion     Dr. Ned Clines, [facility] Op[facility]  . Sepsis due to Escherichia coli (E. coli) ([place]) 05/2011    Morganella also cultured  . Diverticulosis   . TIA (transient ischemic attack) 9/6-05/2012    BP 221/99  . Epistaxis   . Aphasia 12/26/2013    Dr Jannifer Franklin  . History of GI diverticular bleed 710/15    Past Surgical History  Procedure Laterality Date  . Tonsillectomy and adenoidectomy    . Appendectomy      age 63  . Cataract extraction, bilateral  2003    Dr Ishmael Holter (now seen @ [facility])  . Inguinal hernia repair      age 56  . Colonoscopy w/ polypectomy  06/2009    Dr.Jacobs; "miniscule polyp"  . Cholecystectomy  1978    stones  . Upper gi endoscopy  05/2014    negative  . Skin biopsy  12/2014    done by dermatologist     There were no vitals filed for this visit.  Visit Diagnosis: Aphasia      Subjective Assessment - 12/20/15 1409    Subjective "Glendell Docker - you - - Glendell Docker - you almost caught me - almost (2 seconds) caught me asleep."   Patient is accompained by: Family member  wife                     SLP  Long Term Goals - 12/20/15 1700    SLP LONG TERM GOAL #1   Title pt/wife will bring three linguistic settings/circumstances for SLP to assist in problem solving for more efficient communication   Time 4   Period Weeks   Status On-going   SLP LONG TERM GOAL #2   Title pt will demo multimodal communication 50% of the time in 6 opportunities in a session   Time 4   Period Weeks   Status On-going   SLP LONG TERM GOAL #3   Title pt will utilize some low-tech augmentative communication item for communication of desired message   Time 4   Period Weeks   Status On-going          Plan - 12/20/15 1659    Clinical Impression Statement 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 Pt's verbal expression skills in simple conversation today were non-functional. ST is recommended to cont to provide pt and wife compensatory measures to take to improve communication, and problem solve with SLP some specific situations most frustrating for  pt, to improve pt and wife's QOL.   Speech Therapy Frequency 1x /week   Duration 4 weeks   Treatment/Interventions Compensatory strategies;Internal/external aids;Patient/family education;Functional tasks;Cueing hierarchy;Multimodal communcation approach;SLP instruction and feedback   Potential to Achieve Goals Fair   Potential Considerations Severity of impairments;Previous level of function   Consulted and Agree with Plan of Care Patient;Family member/caregiver   Family Member Consulted wife        Problem List Patient Active Problem List   Diagnosis Date Noted  . Fatigue 09/19/2015  . Loss of weight 08/14/2015  . GERD (gastroesophageal reflux disease) 06/06/2015  . Skin cancer 09/14/2014  . History of GI diverticular bleed 06/08/2014  . BRBPR (bright red blood per rectum) 06/01/2014  . Aphasia 12/26/2013  . History of short term memory loss 09/01/2013  . Anemia, unspecified 08/04/2013  . Hypoalbuminemia 08/04/2013  . Leukocytosis,  unspecified 07/26/2013  . Thrombocytopenia, unspecified ([place]) 07/26/2013  . Severe sepsis ([place]) 07/24/2013  . BPH (benign prostatic hyperplasia) 07/24/2013  . Other and unspecified hyperlipidemia 05/18/2013  . Elevated blood pressure reading without diagnosis of hypertension 07/30/2012  . Abnormal blood testing 03/02/2012  . Glaucoma   . Incisional hernia, with obstruction, without gangrene 08/25/2010  . REACTIVE HYPOGLYCEMIA 08/19/2010  . DIVERTICULOSIS, COLON 07/17/2009  . COLONIC POLYPS, HX OF 07/17/2009  . ALLERGIC RHINITIS 10/16/2008  . Other abnormal glucose 10/16/2008  . LIPOMAS, MULTIPLE 11/04/2007  . Rosacea 05/12/2007    [place] , [place], CCC-SLP  12/20/2015, 5:01 PM  [facility] [address] [place], [place], [address] Phone: [phone]   Fax:  [phone]   Name: Jack Noble MRN: [id number] Date of Birth: 20-Mar-1929

## 2015-12-20 NOTE — Patient Instructions (Addendum)
    Practice reading this so you can say it more consistently in the mornings with Joycelyn Schmid:   This is the day the Reita Cliche has made.  Let us rejoice and be glad in it.

## 2015-12-26 ENCOUNTER — Encounter: Payer: Self-pay | Admitting: Internal Medicine

## 2015-12-26 ENCOUNTER — Ambulatory Visit: Payer: MEDICARE | Attending: Neurology

## 2015-12-26 ENCOUNTER — Ambulatory Visit (INDEPENDENT_AMBULATORY_CARE_PROVIDER_SITE_OTHER): Payer: MEDICARE | Admitting: Internal Medicine

## 2015-12-26 VITALS — BP 184/86 | HR 59 | Temp 97.7°F | Resp 16 | Wt 145.0 lb

## 2015-12-26 DIAGNOSIS — R4701 Aphasia: Secondary | ICD-10-CM | POA: Insufficient documentation

## 2015-12-26 DIAGNOSIS — I10 Essential (primary) hypertension: Secondary | ICD-10-CM | POA: Diagnosis not present

## 2015-12-26 MED ORDER — VALSARTAN 40 MG PO TABS: 40.0000 mg | ORAL_TABLET | Freq: Every day | ORAL | Status: DC

## 2015-12-26 NOTE — Assessment & Plan Note (Signed)
Blood pressure is significantly elevated and he has had some slightly elevated blood pressure in the past Continue low-sodium diet and increased activity He is willing to start medication-we will start valsartan 40 mg daily. Discussed possible side effects. Advised him that most likely we will need to increase this medication, but given his age and do not want to start him on too high a dose His wife will check his blood pressure twice daily They will call with any questions or concerns Follow-up in one week, sooner if needed

## 2015-12-26 NOTE — Progress Notes (Signed)
Pre visit review using our clinic review tool, if applicable. No additional management support is needed unless otherwise documented below in the visit note. 

## 2015-12-26 NOTE — Patient Instructions (Signed)
  We will start a medication for your blood pressure called valsartan.  Take it once a day.    Your prescription(s) have been submitted to your pharmacy. Please take as directed and contact our office if you believe you are having problem(s) with the medication(s).  Start monitoring your blood pressure 1-2 times a day.    Call with any questions.    Please schedule followup in 1 week.

## 2015-12-26 NOTE — Progress Notes (Signed)
Subjective:    Patient ID: Jack Noble, male    DOB: [birth year], 82 y.o.   MRN: [id number]  HPI  He is here because his blood pressure has been elevated.   Yesterday he woke up in his usual healthy and he was reading the newspaper and suddenly was unable to read the print.  He did have some dizziness.  His wife took his BP and it was 187/81.  It was later 180/78 and then 158/83.  He denies headache, chest pain, palpitations and sob yesterday or in general. Overall he is very compliant with a low-sodium diet and is active. He has been on blood pressure medication in the past, but was taken off. He experienced lightheadedness with metoprolol.    Medications and allergies reviewed with patient and updated if appropriate.  Patient Active Problem List   Diagnosis Date Noted  . Fatigue 09/19/2015  . Loss of weight 08/14/2015  . GERD (gastroesophageal reflux disease) 06/06/2015  . Skin cancer 09/14/2014  . History of GI diverticular bleed 06/08/2014  . BRBPR (bright red blood per rectum) 06/01/2014  . Aphasia 12/26/2013  . History of short term memory loss 09/01/2013  . Anemia, unspecified 08/04/2013  . Hypoalbuminemia 08/04/2013  . Leukocytosis, unspecified 07/26/2013  . Thrombocytopenia, unspecified ([place]) 07/26/2013  . Severe sepsis ([place]) 07/24/2013  . BPH (benign prostatic hyperplasia) 07/24/2013  . Other and unspecified hyperlipidemia 05/18/2013  . Elevated blood pressure reading without diagnosis of hypertension 07/30/2012  . Abnormal blood testing 03/02/2012  . Glaucoma   . Incisional hernia, with obstruction, without gangrene 08/25/2010  . REACTIVE HYPOGLYCEMIA 08/19/2010  . DIVERTICULOSIS, COLON 07/17/2009  . COLONIC POLYPS, HX OF 07/17/2009  . ALLERGIC RHINITIS 10/16/2008  . Other abnormal glucose 10/16/2008  . LIPOMAS, MULTIPLE 11/04/2007  . Rosacea 05/12/2007    Current Outpatient Prescriptions on File Prior to Visit  Medication Sig Dispense Refill  .  Cholecalciferol (VITAMIN D) 1000 UNITS capsule Take 1,000 Units by mouth every morning.     . finasteride (PROSCAR) 5 MG tablet Take 1 tablet (5 mg total) by mouth daily. 30 tablet 5  . ketoconazole (NIZORAL) 2 % cream Apply 1 application topically 2 (two) times daily as needed for irritation. 30 g 0  . mometasone (ELOCON) 0.1 % ointment APPLY TOPICALLY TO AFFECTED AREA(S) TWICE DAILY 45 g 0  . montelukast (SINGULAIR) 10 MG tablet Take 1 tablet (10 mg total) by mouth at bedtime. 30 tablet 1  . Multiple Vitamin (MULTIVITAMIN WITH MINERALS) TABS Take 1 tablet by mouth every morning.     . nystatin-triamcinolone (MYCOLOG II) cream Apply 1 application topically daily as needed (Applies to red places in groin area.). --- Please establish with new PCP for further refills 30 g 0  . simethicone (MYLICON) 80 MG chewable tablet Chew 1 tablet (80 mg total) by mouth every 6 (six) hours as needed for flatulence. 30 tablet 0  . Timolol Maleate (ISTALOL) 0.5 % (DAILY) SOLN Place 1 drop into the left eye 2 (two) times daily at 8 am and 10 pm.      No current facility-administered medications on file prior to visit.    Past Medical History  Diagnosis Date  . Dermatophytosis of nail   . Hyperlipidemia   . Hearing loss   . Hyperplasia of prostate without urinary obstruction   . Cataracts, bilateral   . Rosacea     Dr. Marge Duncans  . Allergy   . Glaucoma     Dr. Edilia Bo  .  Corneal abrasion     Dr. Ned Clines, [facility] Op[facility]  . Sepsis due to Escherichia coli (E. coli) ([place]) 05/2011    Morganella also cultured  . Diverticulosis   . TIA (transient ischemic attack) 9/6-05/2012    BP 221/99  . Epistaxis   . Aphasia 12/26/2013    Dr Jannifer Franklin  . History of GI diverticular bleed 710/15    Past Surgical History  Procedure Laterality Date  . Tonsillectomy and adenoidectomy    . Appendectomy      age 69  . Cataract extraction, bilateral  2003    Dr Ishmael Holter (now seen @ [facility])  . Inguinal hernia repair      age 23  .  Colonoscopy w/ polypectomy  06/2009    Dr.Jacobs; "miniscule polyp"  . Cholecystectomy  1978    stones  . Upper gi endoscopy  05/2014    negative  . Skin biopsy  12/2014    done by dermatologist     Social History   Social History  . Marital Status: Married    Spouse Name: N/A  . Number of Children: 1  . Years of Education: college   Occupational History  . [employer]    Social History Main Topics  . Smoking status: Never Smoker   . Smokeless tobacco: Never Used  . Alcohol Use: No  . Drug Use: No  . Sexual Activity: Not on file   Other Topics Concern  . Not on file   Social History Narrative   Lives with his wife.      Family History  Problem Relation Age of Onset  . Stroke Mother 82  . Parkinsonism Brother     Brother died at age at age 15  . Pulmonary embolism Father 73    Post Op  . Diabetes Neg Hx   . Cancer Neg Hx   . COPD Neg Hx   . Heart disease Neg Hx     Review of Systems  Constitutional: Negative for fever and chills.  Respiratory: Negative for cough, shortness of breath and wheezing.   Cardiovascular: Negative for chest pain, palpitations and leg swelling.  Neurological: Positive for dizziness (yesterday morning only). Negative for light-headedness and headaches.       Objective:   Filed Vitals:   12/26/15 1046  BP: 184/86  Pulse: 59  Temp: 97.7 F (36.5 C)  Resp: 16   Filed Weights   12/26/15 1046  Weight: 145 lb (65.772 kg)   Body mass index is 23.41 kg/(m^2).   Physical Exam Constitutional: Appears well-developed and well-nourished. No distress.  Neck: Neck supple. No tracheal deviation present. No thyromegaly present.  No carotid bruit. No cervical adenopathy.   Cardiovascular: Normal rate, regular rhythm and normal heart sounds.   No murmur heard.  No edema Pulmonary/Chest: Effort normal and breath sounds normal. No respiratory distress. No wheezes.      Assessment & Plan:   See problem list for assessment and  plan  Follow-up in one week, sooner if needed

## 2015-12-26 NOTE — Therapy (Signed)
[facility] [address] [place], [place], [address] Phone: [phone]   Fax:  [phone]  Speech Language Pathology Treatment  Patient Details  Name: Jack Noble MRN: [id number] Date of Birth: [birth year] Referring Provider: Neldon Newport  Encounter Date: 12/26/2015      End of Session - 12/26/15 1640    Visit Number 3   Number of Visits 5   Date for SLP Re-Evaluation 02/03/16   SLP Start Time [id number]   SLP Stop Time  1620   SLP Time Calculation (min) 45 min   Activity Tolerance Patient tolerated treatment well      Past Medical History  Diagnosis Date  . Dermatophytosis of nail   . Hyperlipidemia   . Hearing loss   . Hyperplasia of prostate without urinary obstruction   . Cataracts, bilateral   . Rosacea     Dr. Marge Duncans  . Allergy   . Glaucoma     Dr. Edilia Bo  . Corneal abrasion     Dr. Ned Clines, [facility] Op[facility]  . Sepsis due to Escherichia coli (E. coli) ([place]) 05/2011    Morganella also cultured  . Diverticulosis   . TIA (transient ischemic attack) 9/6-05/2012    BP 221/99  . Epistaxis   . Aphasia 12/26/2013    Dr Jannifer Franklin  . History of GI diverticular bleed 710/15    Past Surgical History  Procedure Laterality Date  . Tonsillectomy and adenoidectomy    . Appendectomy      age 49  . Cataract extraction, bilateral  2003    Dr Ishmael Holter (now seen @ [facility])  . Inguinal hernia repair      age 4  . Colonoscopy w/ polypectomy  06/2009    Dr.Jacobs; "miniscule polyp"  . Cholecystectomy  1978    stones  . Upper gi endoscopy  05/2014    negative  . Skin biopsy  12/2014    done by dermatologist     There were no vitals filed for this visit.  Visit Diagnosis: Aphasia      Subjective Assessment - 12/26/15 1540    Subjective "Hi there."   Patient is accompained by: Family member  wife               ADULT SLP TREATMENT - 12/26/15 1540    General Information   Behavior/Cognition  Alert;Cooperative;Pleasant mood   Treatment Provided   Treatment provided Cognitive-Linquistic   Pain Assessment   Pain Assessment 0-10   Pain Score 2    Pain Location stomach   Pain Descriptors / Indicators Aching   Pain Intervention(s) Monitored during session   Cognitive-Linquistic Treatment   Treatment focused on Aphasia   Skilled Treatment SLP facilitated pt's therapy today by working with pt and wife to derive categories for a low tech augmentative communication board for pt to use when verbal communication falters/breaks down. Pt to add specifics to the foundation began by SLP/pt/pt wife today.    Assessment / Recommendations / Plan   Plan Continue with current plan of care          SLP Education - 12/26/15 1640    Education provided Yes   Education Details augmentative communication board purpose   Person(s) Educated Patient;Spouse   Methods Explanation;Demonstration   Comprehension Verbalized understanding            SLP Long Term Goals - 12/26/15 1642    SLP LONG TERM GOAL #1   Title pt/wife will participate  in developing a low tech augmentative communication item/device for message conveyance   Time 4   Period Weeks   Status Revised   SLP LONG TERM GOAL #2   Title pt will demo multimodal communication 50% of the time in 6 opportunities in a session   Time 4   Period Weeks   Status On-going   SLP LONG TERM GOAL #3   Title pt will utilize some low-tech augmentative communication item for communication of desired message   Time 4   Period Weeks   Status On-going          Plan - 12/26/15 1641    Clinical Impression Statement 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 Pt's verbal expression skills in simple conversation today throughout session were again non-functional. ST is recommended to cont to provide pt and wife compensatory measures to take to improve communication, and problem solve with SLP some specific situations most frustrating for  pt, to improve pt and wife's QOL.   Speech Therapy Frequency 1x /week   Duration 4 weeks   Treatment/Interventions Compensatory strategies;Internal/external aids;Patient/family education;Functional tasks;Cueing hierarchy;Multimodal communcation approach;SLP instruction and feedback   Potential to Achieve Goals Fair   Potential Considerations Severity of impairments;Previous level of function   Consulted and Agree with Plan of Care Patient;Family member/caregiver   Family Member Consulted wife        Problem List Patient Active Problem List   Diagnosis Date Noted  . Essential hypertension, benign 12/26/2015  . Fatigue 09/19/2015  . Loss of weight 08/14/2015  . GERD (gastroesophageal reflux disease) 06/06/2015  . Skin cancer 09/14/2014  . History of GI diverticular bleed 06/08/2014  . BRBPR (bright red blood per rectum) 06/01/2014  . Aphasia 12/26/2013  . History of short term memory loss 09/01/2013  . Anemia, unspecified 08/04/2013  . Hypoalbuminemia 08/04/2013  . Leukocytosis, unspecified 07/26/2013  . Thrombocytopenia, unspecified ([place]) 07/26/2013  . Severe sepsis ([place]) 07/24/2013  . BPH (benign prostatic hyperplasia) 07/24/2013  . Other and unspecified hyperlipidemia 05/18/2013  . Glaucoma   . Incisional hernia, with obstruction, without gangrene 08/25/2010  . REACTIVE HYPOGLYCEMIA 08/19/2010  . DIVERTICULOSIS, COLON 07/17/2009  . COLONIC POLYPS, HX OF 07/17/2009  . ALLERGIC RHINITIS 10/16/2008  . Other abnormal glucose 10/16/2008  . LIPOMAS, MULTIPLE 11/04/2007  . Rosacea 05/12/2007    [facility] , [place], CCC-SLP  12/26/2015, 4:43 PM  [facility] [address] [place], [place], [address] Phone: [phone]   Fax:  [phone]   Name: Jack Noble MRN: [id number] Date of Birth: 07-07-1929

## 2015-12-26 NOTE — Patient Instructions (Signed)
TO FILL OUT THIS WEEK:   Errands        [employer]        before [employer] [place]          2000's 2010-today                              [place] Wednesday night-folders  Sunday morning

## 2015-12-31 ENCOUNTER — Ambulatory Visit: Payer: MEDICARE

## 2016-01-02 ENCOUNTER — Ambulatory Visit (INDEPENDENT_AMBULATORY_CARE_PROVIDER_SITE_OTHER): Payer: MEDICARE | Admitting: Internal Medicine

## 2016-01-02 ENCOUNTER — Encounter: Payer: MEDICARE | Admitting: Speech Pathology

## 2016-01-02 ENCOUNTER — Encounter: Payer: Self-pay | Admitting: Internal Medicine

## 2016-01-02 VITALS — BP 132/70 | HR 56 | Temp 97.7°F | Resp 16 | Wt 146.0 lb

## 2016-01-02 DIAGNOSIS — R4701 Aphasia: Secondary | ICD-10-CM | POA: Diagnosis not present

## 2016-01-02 DIAGNOSIS — N400 Enlarged prostate without lower urinary tract symptoms: Secondary | ICD-10-CM

## 2016-01-02 DIAGNOSIS — I10 Essential (primary) hypertension: Secondary | ICD-10-CM | POA: Diagnosis not present

## 2016-01-02 NOTE — Patient Instructions (Addendum)
    Medications reviewed and updated.   No changes recommended at this time.  Please schedule followup in 6 months      

## 2016-01-02 NOTE — Progress Notes (Signed)
Subjective:    Patient ID: Jack Noble, male    DOB: Aug 23, 1929, 82 y.o.   MRN: [id number]  HPI He is here for follow up for hypertension.  Hypertension: He is taking his medication daily - started valsartan 40 mg daily. He denies side effects.  He is compliant with a low sodium diet.  He denies chest pain, palpitations, edema, shortness of breath and regular headaches. He is active.  He does monitor his blood pressure at home on average it has been 137/67.     BPH:  He is taking his proscar daily.  He denies difficulty urinating.  He does have frequent urination and urgency, but that is not new and has not worsened.     Medications and allergies reviewed with patient and updated if appropriate.  Patient Active Problem List   Diagnosis Date Noted  . Essential hypertension, benign 12/26/2015  . Fatigue 09/19/2015  . Loss of weight 08/14/2015  . GERD (gastroesophageal reflux disease) 06/06/2015  . Skin cancer 09/14/2014  . History of GI diverticular bleed 06/08/2014  . BRBPR (bright red blood per rectum) 06/01/2014  . Aphasia 12/26/2013  . History of short term memory loss 09/01/2013  . Anemia, unspecified 08/04/2013  . Hypoalbuminemia 08/04/2013  . Leukocytosis, unspecified 07/26/2013  . Thrombocytopenia, unspecified ([place]) 07/26/2013  . Severe sepsis ([place]) 07/24/2013  . BPH (benign prostatic hyperplasia) 07/24/2013  . Other and unspecified hyperlipidemia 05/18/2013  . Glaucoma   . Incisional hernia, with obstruction, without gangrene 08/25/2010  . REACTIVE HYPOGLYCEMIA 08/19/2010  . DIVERTICULOSIS, COLON 07/17/2009  . COLONIC POLYPS, HX OF 07/17/2009  . ALLERGIC RHINITIS 10/16/2008  . Other abnormal glucose 10/16/2008  . LIPOMAS, MULTIPLE 11/04/2007  . Rosacea 05/12/2007    Current Outpatient Prescriptions on File Prior to Visit  Medication Sig Dispense Refill  . Cholecalciferol (VITAMIN D) 1000 UNITS capsule Take 1,000 Units by mouth every morning.     .  finasteride (PROSCAR) 5 MG tablet Take 1 tablet (5 mg total) by mouth daily. 30 tablet 5  . ketoconazole (NIZORAL) 2 % cream Apply 1 application topically 2 (two) times daily as needed for irritation. 30 g 0  . mometasone (ELOCON) 0.1 % ointment APPLY TOPICALLY TO AFFECTED AREA(S) TWICE DAILY 45 g 0  . montelukast (SINGULAIR) 10 MG tablet Take 1 tablet (10 mg total) by mouth at bedtime. 30 tablet 1  . Multiple Vitamin (MULTIVITAMIN WITH MINERALS) TABS Take 1 tablet by mouth every morning.     . nystatin-triamcinolone (MYCOLOG II) cream Apply 1 application topically daily as needed (Applies to red places in groin area.). --- Please establish with new PCP for further refills 30 g 0  . simethicone (MYLICON) 80 MG chewable tablet Chew 1 tablet (80 mg total) by mouth every 6 (six) hours as needed for flatulence. 30 tablet 0  . Timolol Maleate (ISTALOL) 0.5 % (DAILY) SOLN Place 1 drop into the left eye 2 (two) times daily at 8 am and 10 pm.     . valsartan (DIOVAN) 40 MG tablet Take 1 tablet (40 mg total) by mouth daily. 30 tablet 5   No current facility-administered medications on file prior to visit.    Past Medical History  Diagnosis Date  . Dermatophytosis of nail   . Hyperlipidemia   . Hearing loss   . Hyperplasia of prostate without urinary obstruction   . Cataracts, bilateral   . Rosacea     Dr. Marge Duncans  . Allergy   . Glaucoma  Dr. Edilia Bo  . Corneal abrasion     Dr. Ned Clines, [facility] Ophth  . Sepsis due to Escherichia coli (E. coli) ([place]) 05/2011    Morganella also cultured  . Diverticulosis   . TIA (transient ischemic attack) 9/6-05/2012    BP 221/99  . Epistaxis   . Aphasia 12/26/2013    Dr Jannifer Franklin  . History of GI diverticular bleed 710/15    Past Surgical History  Procedure Laterality Date  . Tonsillectomy and adenoidectomy    . Appendectomy      age 39  . Cataract extraction, bilateral  2003    Dr Ishmael Holter (now seen @ [facility])  . Inguinal hernia repair      age 48  .  Colonoscopy w/ polypectomy  06/2009    Dr.Jacobs; "miniscule polyp"  . Cholecystectomy  1978    stones  . Upper gi endoscopy  05/2014    negative  . Skin biopsy  12/2014    done by dermatologist     Social History   Social History  . Marital Status: Married    Spouse Name: N/A  . Number of Children: 1  . Years of Education: college   Occupational History  . [employer]    Social History Main Topics  . Smoking status: Never Smoker   . Smokeless tobacco: Never Used  . Alcohol Use: No  . Drug Use: No  . Sexual Activity: Not Asked   Other Topics Concern  . None   Social History Narrative   Lives with his wife.      Family History  Problem Relation Age of Onset  . Stroke Mother 32  . Parkinsonism Brother     Brother died at age at age 28  . Pulmonary embolism Father 84    Post Op  . Diabetes Neg Hx   . Cancer Neg Hx   . COPD Neg Hx   . Heart disease Neg Hx     Review of Systems  Constitutional: Negative for fever.  Respiratory: Negative for shortness of breath.   Cardiovascular: Negative for chest pain, palpitations and leg swelling.  Gastrointestinal: Positive for abdominal pain (related to gas, hernia).       No GERD  Genitourinary: Positive for urgency and frequency. Negative for difficulty urinating.  Neurological: Negative for light-headedness and headaches.       Objective:   Filed Vitals:   01/02/16 1032  BP: 132/70  Pulse: 56  Temp: 97.7 F (36.5 C)  Resp: 16   Filed Weights   01/02/16 1032  Weight: 146 lb (66.225 kg)   Body mass index is 23.58 kg/(m^2).   Physical Exam Constitutional: Appears well-developed and well-nourished. No distress.  Neck: Neck supple. No tracheal deviation present. No thyromegaly present.  No carotid bruit. No cervical adenopathy.   Cardiovascular: Normal rate, regular rhythm and normal heart sounds.   No murmur heard.  No edema Pulmonary/Chest: Effort normal and breath sounds normal. No respiratory  distress. No wheezes.  Abdomen:  Large ventral hernia - incisional, nontender       Assessment & Plan:   See Problem List for Assessment and Plan of chronic medical problems.  Follow up in 6 months - wellness and follow up

## 2016-01-02 NOTE — Progress Notes (Signed)
Pre visit review using our clinic review tool, if applicable. No additional management support is needed unless otherwise documented below in the visit note. 

## 2016-01-04 NOTE — Assessment & Plan Note (Signed)
Well controlled Continue proscar daily

## 2016-01-04 NOTE — Assessment & Plan Note (Signed)
Started after hospitalization in 2014 - sepsis with very high fever Tried PT, but does not want to do any more Following with Dr. Jannifer Franklin

## 2016-01-04 NOTE — Assessment & Plan Note (Signed)
BP well controlled Continue diovan 40 mg daily Continue to monitor BP at home

## 2016-01-16 DIAGNOSIS — R404 Transient alteration of awareness: Secondary | ICD-10-CM | POA: Diagnosis not present

## 2016-01-16 DIAGNOSIS — R531 Weakness: Secondary | ICD-10-CM | POA: Diagnosis not present

## 2016-01-27 DIAGNOSIS — H4052X1 Glaucoma secondary to other eye disorders, left eye, mild stage: Secondary | ICD-10-CM | POA: Diagnosis not present

## 2016-01-27 DIAGNOSIS — H18453 Nodular corneal degeneration, bilateral: Secondary | ICD-10-CM | POA: Diagnosis not present

## 2016-01-27 DIAGNOSIS — H04123 Dry eye syndrome of bilateral lacrimal glands: Secondary | ICD-10-CM | POA: Diagnosis not present

## 2016-01-31 ENCOUNTER — Ambulatory Visit (INDEPENDENT_AMBULATORY_CARE_PROVIDER_SITE_OTHER): Payer: MEDICARE | Admitting: Nurse Practitioner

## 2016-01-31 ENCOUNTER — Encounter: Payer: Self-pay | Admitting: Nurse Practitioner

## 2016-01-31 ENCOUNTER — Telehealth: Payer: Self-pay | Admitting: Internal Medicine

## 2016-01-31 VITALS — BP 140/80 | HR 61 | Temp 97.4°F | Ht 66.0 in | Wt 145.0 lb

## 2016-01-31 DIAGNOSIS — R252 Cramp and spasm: Secondary | ICD-10-CM

## 2016-01-31 MED ORDER — GABAPENTIN 100 MG PO CAPS: 100.0000 mg | ORAL_CAPSULE | Freq: Three times a day (TID) | ORAL | Status: DC

## 2016-01-31 NOTE — Patient Instructions (Addendum)
Try over the counter ibuprofen first.   Try the Gabpentin on Monday night after speaking with your pharmacist  Follow up with me on Wednesday.

## 2016-01-31 NOTE — Progress Notes (Signed)
Patient ID: Jack Noble, male    DOB: Jan 30, 1929  Age: 82 y.o. MRN: [id number]  CC: No chief complaint on file.   HPI Jack Noble presents for CC of Right leg cramps 1 day. Patient is accompanied by his wife.  1) Wife report patient's right leg cramped up last night-  An patient was in pain for most of the night Wife reports "she does not want to go through this again"  Centrum silver and OJ- for potassium per patient's wife  Treatment-  Water, heating pad, 2 tylenol, wife massaged it  Some resolution with these measures  Patient reports it happens when he is walking and able come on all of a sudden today.   History Jack Noble has a past medical history of Dermatophytosis of nail; Hyperlipidemia; Hearing loss; Hyperplasia of prostate without urinary obstruction; Cataracts, bilateral; Rosacea; Allergy; Glaucoma; Corneal abrasion; Sepsis due to Escherichia coli (E. coli) ([place]) (05/2011); Diverticulosis; TIA (transient ischemic attack) (9/6-05/2012); Epistaxis; Aphasia (12/26/2013); and History of GI diverticular bleed (710/15).   He has past surgical history that includes Tonsillectomy and adenoidectomy; Appendectomy; Cataract extraction, bilateral (2003); Inguinal hernia repair; Colonoscopy w/ polypectomy (06/2009); Cholecystectomy (1978); Upper gi endoscopy (05/2014); and Skin biopsy (12/2014).   His family history includes Parkinsonism in his brother; Pulmonary embolism (age of onset: 27) in his father; Stroke (age of onset: 33) in his mother. There is no history of Diabetes, Cancer, COPD, or Heart disease.He reports that he has never smoked. He has never used smokeless tobacco. He reports that he does not drink alcohol or use illicit drugs.  Outpatient Prescriptions Prior to Visit  Medication Sig Dispense Refill  . Cholecalciferol (VITAMIN D) 1000 UNITS capsule Take 1,000 Units by mouth every morning.     . finasteride (PROSCAR) 5 MG tablet Take 1 tablet (5 mg total) by mouth daily.  30 tablet 5  . ketoconazole (NIZORAL) 2 % cream Apply 1 application topically 2 (two) times daily as needed for irritation. 30 g 0  . mometasone (ELOCON) 0.1 % ointment APPLY TOPICALLY TO AFFECTED AREA(S) TWICE DAILY 45 g 0  . montelukast (SINGULAIR) 10 MG tablet Take 1 tablet (10 mg total) by mouth at bedtime. 30 tablet 1  . Multiple Vitamin (MULTIVITAMIN WITH MINERALS) TABS Take 1 tablet by mouth every morning.     . nystatin-triamcinolone (MYCOLOG II) cream Apply 1 application topically daily as needed (Applies to red places in groin area.). --- Please establish with new PCP for further refills 30 g 0  . simethicone (MYLICON) 80 MG chewable tablet Chew 1 tablet (80 mg total) by mouth every 6 (six) hours as needed for flatulence. 30 tablet 0  . Timolol Maleate (ISTALOL) 0.5 % (DAILY) SOLN Place 1 drop into the left eye 2 (two) times daily at 8 am and 10 pm.     . valsartan (DIOVAN) 40 MG tablet Take 1 tablet (40 mg total) by mouth daily. 30 tablet 5   No facility-administered medications prior to visit.    ROS Review of Systems  Constitutional: Negative for fever, chills, diaphoresis and fatigue.  Eyes: Negative for visual disturbance.  Respiratory: Negative for chest tightness, shortness of breath and wheezing.   Cardiovascular: Negative for chest pain, palpitations and leg swelling.  Musculoskeletal: Positive for myalgias and gait problem. Negative for back pain, joint swelling, arthralgias, neck pain and neck stiffness.  Skin: Negative.   Neurological: Negative for dizziness, weakness and numbness.  Psychiatric/Behavioral: The patient is not nervous/anxious.  Objective:  BP 140/80 mmHg  Pulse 61  Temp(Src) 97.4 F (36.3 C) (Oral)  Ht 5\' 6"  (1.676 m)  Wt 145 lb (65.772 kg)  BMI 23.41 kg/m2  SpO2 94%  Physical Exam  Constitutional: He is oriented to person, place, and time. He appears well-developed and well-nourished. No distress.  HENT:  Head: Normocephalic and  atraumatic.  Right Ear: External ear normal.  Left Ear: External ear normal.  Cardiovascular: Normal rate, regular rhythm and normal heart sounds.  Exam reveals no gallop and no friction rub.   No murmur heard. Pulmonary/Chest: Effort normal and breath sounds normal. No respiratory distress. He has no wheezes. He has no rales. He exhibits no tenderness.  Musculoskeletal: Normal range of motion. He exhibits no edema or tenderness.  No tenderness or swelling to bilateral calf muscles, no erythema, no warmth. No tenderness to palpation of calf, shin, quadricep muscle on the right leg  Neurological: He is alert and oriented to person, place, and time. He displays normal reflexes. He exhibits abnormal muscle tone. Coordination normal.  Patient seems to have some aphasia, which is in his medical history 4/5 bilateral iliopsoas   Skin: Skin is warm and dry. No rash noted. He is not diaphoretic.  Psychiatric: He has a normal mood and affect. His behavior is normal. Judgment and thought content normal.   Assessment & Plan:   Diagnoses and all orders for this visit:  Cramps of right lower extremity  Other orders -     Discontinue: gabapentin (NEURONTIN) 100 MG capsule; Take 1 capsule (100 mg total) by mouth 3 (three) times daily.  I am having Jack Noble maintain his Timolol Maleate, Vitamin D, multivitamin with minerals, ketoconazole, simethicone, mometasone, montelukast, finasteride, nystatin-triamcinolone, and valsartan.  Meds ordered this encounter  Medications  . DISCONTD: gabapentin (NEURONTIN) 100 MG capsule    Sig: Take 1 capsule (100 mg total) by mouth 3 (three) times daily.    Dispense:  30 capsule    Refill:  0    Order Specific Question:  Supervising Provider    Answer:  Crecencio Mc [[id number]]     Follow-up: Return if symptoms worsen or fail to improve.

## 2016-01-31 NOTE — Telephone Encounter (Signed)
Morey Hummingbird is asking this patient to follow up with her on Wednesday 03/15. The schedule is not open yet. Will you keep an eye open for when we open it next week and call the patient or let me know?

## 2016-01-31 NOTE — Progress Notes (Signed)
Pre visit review using our clinic review tool, if applicable. No additional management support is needed unless otherwise documented below in the visit note. 

## 2016-02-03 ENCOUNTER — Other Ambulatory Visit (INDEPENDENT_AMBULATORY_CARE_PROVIDER_SITE_OTHER): Payer: MEDICARE

## 2016-02-03 ENCOUNTER — Telehealth: Payer: Self-pay | Admitting: Internal Medicine

## 2016-02-03 ENCOUNTER — Encounter: Payer: Self-pay | Admitting: Internal Medicine

## 2016-02-03 ENCOUNTER — Other Ambulatory Visit: Payer: Self-pay | Admitting: Internal Medicine

## 2016-02-03 ENCOUNTER — Ambulatory Visit (INDEPENDENT_AMBULATORY_CARE_PROVIDER_SITE_OTHER): Payer: MEDICARE | Admitting: Internal Medicine

## 2016-02-03 ENCOUNTER — Ambulatory Visit (INDEPENDENT_AMBULATORY_CARE_PROVIDER_SITE_OTHER)
Admission: RE | Admit: 2016-02-03 | Discharge: 2016-02-03 | Disposition: A | Discharge status: Home / Self Care | Payer: MEDICARE | Source: Ambulatory Visit | Attending: Internal Medicine | Admitting: Internal Medicine

## 2016-02-03 VITALS — BP 150/76 | HR 80 | Temp 97.4°F | Resp 16 | Wt 145.0 lb

## 2016-02-03 DIAGNOSIS — M5136 Other intervertebral disc degeneration, lumbar region: Secondary | ICD-10-CM | POA: Diagnosis not present

## 2016-02-03 DIAGNOSIS — R29898 Other symptoms and signs involving the musculoskeletal system: Secondary | ICD-10-CM | POA: Diagnosis not present

## 2016-02-03 DIAGNOSIS — R5383 Other fatigue: Secondary | ICD-10-CM | POA: Diagnosis not present

## 2016-02-03 DIAGNOSIS — R410 Disorientation, unspecified: Secondary | ICD-10-CM

## 2016-02-03 LAB — CBC WITH DIFFERENTIAL/PLATELET
BASOS ABS: 0 10*3/uL (ref 0.0–0.1)
Basophils Relative: 0.4 % (ref 0.0–3.0)
EOS PCT: 1.3 % (ref 0.0–5.0)
Eosinophils Absolute: 0.1 10*3/uL (ref 0.0–0.7)
HCT: 43.5 % (ref 39.0–52.0)
Hemoglobin: 14.8 g/dL (ref 13.0–17.0)
Lymphocytes Relative: 22.1 % (ref 12.0–46.0)
Lymphs Abs: 1.7 10*3/uL (ref 0.7–4.0)
MCHC: 34 g/dL (ref 30.0–36.0)
MCV: 96.1 fl (ref 78.0–100.0)
MONOS PCT: 7.5 % (ref 3.0–12.0)
Monocytes Absolute: 0.6 10*3/uL (ref 0.1–1.0)
NEUTROS PCT: 68.7 % (ref 43.0–77.0)
Neutro Abs: 5.4 10*3/uL (ref 1.4–7.7)
PLATELETS: 226 10*3/uL (ref 150.0–400.0)
RBC: 4.52 Mil/uL (ref 4.22–5.81)
RDW: 13.1 % (ref 11.5–15.5)
WBC: 7.8 10*3/uL (ref 4.0–10.5)

## 2016-02-03 LAB — COMPREHENSIVE METABOLIC PANEL
ALK PHOS: 40 U/L (ref 39–117)
ALT: 15 U/L (ref 0–53)
AST: 21 U/L (ref 0–37)
Albumin: 4.5 g/dL (ref 3.5–5.2)
BILIRUBIN TOTAL: 0.5 mg/dL (ref 0.2–1.2)
BUN: 13 mg/dL (ref 6–23)
CO2: 29 meq/L (ref 19–32)
CREATININE: 0.62 mg/dL (ref 0.40–1.50)
Calcium: 9.9 mg/dL (ref 8.4–10.5)
Chloride: 100 mEq/L (ref 96–112)
GFR: 131.03 mL/min (ref >60.00)
GLUCOSE: 100 mg/dL — AB (ref 70–99)
Potassium: 4 mEq/L (ref 3.5–5.1)
SODIUM: 136 meq/L (ref 135–145)
TOTAL PROTEIN: 7.4 g/dL (ref 6.0–8.3)

## 2016-02-03 LAB — URINALYSIS, ROUTINE W REFLEX MICROSCOPIC
BILIRUBIN URINE: NEGATIVE
HGB URINE DIPSTICK: NEGATIVE
Ketones, ur: NEGATIVE
Leukocytes, UA: NEGATIVE
Nitrite: NEGATIVE
RBC / HPF: NONE SEEN (ref 0–?)
Specific Gravity, Urine: 1.015 (ref 1.000–1.030)
Total Protein, Urine: NEGATIVE
Urine Glucose: NEGATIVE
Urobilinogen, UA: 0.2 (ref 0.0–1.0)
pH: 6 (ref 5.0–8.0)

## 2016-02-03 MED ORDER — IBUPROFEN 200 MG PO CAPS: 400.0000 mg | ORAL_CAPSULE | Freq: Three times a day (TID) | ORAL | Status: DC | PRN

## 2016-02-03 NOTE — Telephone Encounter (Signed)
PT was seen today 02/03/16 by Dr Quay Burow.

## 2016-02-03 NOTE — Telephone Encounter (Signed)
Xray of back shows no acute findings.  Blood counts, kidney function, liver tests and urine all normal.  No evidence of an infection.  Find out how he is feeling.

## 2016-02-03 NOTE — Patient Instructions (Signed)
Have blood work, urine tests and a back xray today.  We will call your tomorrow with these results.   If your symptoms worsen or you have other concerning symptoms you should go to the ED to be evaluated.

## 2016-02-03 NOTE — Progress Notes (Addendum)
Subjective:    Patient ID: Jack Noble, male    DOB: [birth year], 82 y.o.   MRN: [id number]  HPI He is here for an acute visit for leg weakness.    Last week he was complaining of leg cramp symptoms ing.  By the end of the week it was leg cramps.  He saw carrie three days ago and she prescribed ibuprofen and gabapentin, which they agreed he would only take if the ibuprofen did not help.  The ibuprofen helped with the cramping and he did not take the gabapentin. pain.  Two nights ago he fell - he went to bend over and fell.  That is when they noticed his legs were getting weak.  Sunday he went to sit down and missed the chair.  His back started hurting after that, but he did not have any back pain prior. He currently denies any back pain.  The leg weakness really started yesterday they feel.    He has numbness/tingling in the right lower leg.    He took ibuprofen 200-400 mg three times a day with food.  And it has helped.  He did not take any ibuprofen today.     He has an abscess in one of his teeth and may need to have a tooth extracted.  He has taken clindamycin for that.  He denies any tooth pain.   Adverse his wife thought he may have been confused, but she feels the transient confusion he experienced this point. When he first woke up from the he was unsure where this was, but he was also sleeping downstairs upstairs. She denies any further confusion. She denies any knee eating. She denies change in speaking and he denies weakness in his arms, headaches or changes in his vision.  Medications and allergies reviewed with patient and updated if appropriate.  Patient Active Problem List   Diagnosis Date Noted  . Essential hypertension, benign 12/26/2015  . Fatigue 09/19/2015  . Loss of weight 08/14/2015  . GERD (gastroesophageal reflux disease) 06/06/2015  . Skin cancer 09/14/2014  . History of GI diverticular bleed 06/08/2014  . BRBPR (bright red blood per rectum) 06/01/2014    . Aphasia 12/26/2013  . History of short term memory loss 09/01/2013  . Anemia, unspecified 08/04/2013  . Hypoalbuminemia 08/04/2013  . Severe sepsis ([place]) 07/24/2013  . BPH (benign prostatic hyperplasia) 07/24/2013  . Other and unspecified hyperlipidemia 05/18/2013  . Glaucoma   . Incisional hernia, with obstruction, without gangrene 08/25/2010  . DIVERTICULOSIS, COLON 07/17/2009  . COLONIC POLYPS, HX OF 07/17/2009  . ALLERGIC RHINITIS 10/16/2008  . Other abnormal glucose 10/16/2008  . LIPOMAS, MULTIPLE 11/04/2007  . Rosacea 05/12/2007    Current Outpatient Prescriptions on File Prior to Visit  Medication Sig Dispense Refill  . Cholecalciferol (VITAMIN D) 1000 UNITS capsule Take 1,000 Units by mouth every morning.     . finasteride (PROSCAR) 5 MG tablet Take 1 tablet (5 mg total) by mouth daily. 30 tablet 5  . gabapentin (NEURONTIN) 100 MG capsule Take 1 capsule (100 mg total) by mouth 3 (three) times daily. 30 capsule 0  . ketoconazole (NIZORAL) 2 % cream Apply 1 application topically 2 (two) times daily as needed for irritation. 30 g 0  . mometasone (ELOCON) 0.1 % ointment APPLY TOPICALLY TO AFFECTED AREA(S) TWICE DAILY 45 g 0  . montelukast (SINGULAIR) 10 MG tablet Take 1 tablet (10 mg total) by mouth at bedtime. 30 tablet 1  . Multiple  Vitamin (MULTIVITAMIN WITH MINERALS) TABS Take 1 tablet by mouth every morning.     . nystatin-triamcinolone (MYCOLOG II) cream Apply 1 application topically daily as needed (Applies to red places in groin area.). --- Please establish with new PCP for further refills 30 g 0  . simethicone (MYLICON) 80 MG chewable tablet Chew 1 tablet (80 mg total) by mouth every 6 (six) hours as needed for flatulence. 30 tablet 0  . Timolol Maleate (ISTALOL) 0.5 % (DAILY) SOLN Place 1 drop into the left eye 2 (two) times daily at 8 am and 10 pm.     . valsartan (DIOVAN) 40 MG tablet Take 1 tablet (40 mg total) by mouth daily. 30 tablet 5   No current  facility-administered medications on file prior to visit.    Past Medical History  Diagnosis Date  . Dermatophytosis of nail   . Hyperlipidemia   . Hearing loss   . Hyperplasia of prostate without urinary obstruction   . Cataracts, bilateral   . Rosacea     Dr. Marge Duncans  . Allergy   . Glaucoma     Dr. Edilia Bo  . Corneal abrasion     Dr. Ned Clines, [facility]  . Sepsis due to Escherichia coli (E. coli) ([place]) 05/2011    Morganella also cultured  . Diverticulosis   . TIA (transient ischemic attack) 9/6-05/2012    BP 221/99  . Epistaxis   . Aphasia 12/26/2013    Dr Jannifer Franklin  . History of GI diverticular bleed 710/15    Past Surgical History  Procedure Laterality Date  . Tonsillectomy and adenoidectomy    . Appendectomy      age 31  . Cataract extraction, bilateral  2003    Dr Ishmael Holter (now seen @ [place])  . Inguinal hernia repair      age 34  . Colonoscopy w/ polypectomy  06/2009    Dr.Jacobs; "miniscule polyp"  . Cholecystectomy  1978    stones  . Upper gi endoscopy  05/2014    negative  . Skin biopsy  12/2014    done by dermatologist     Social History   Social History  . Marital Status: Married    Spouse Name: N/A  . Number of Children: 1  . Years of Education: college   Occupational History  . [employer]    Social History Main Topics  . Smoking status: Never Smoker   . Smokeless tobacco: Never Used  . Alcohol Use: No  . Drug Use: No  . Sexual Activity: Not on file   Other Topics Concern  . Not on file   Social History Narrative   Lives with his wife.      Family History  Problem Relation Age of Onset  . Stroke Mother 36  . Parkinsonism Brother     Brother died at age at age 74  . Pulmonary embolism Father 14    Post Op  . Diabetes Neg Hx   . Cancer Neg Hx   . COPD Neg Hx   . Heart disease Neg Hx     Review of Systems  Constitutional: Negative for fever.  HENT: Negative for congestion, sinus pressure and sore throat.   Eyes:  Negative for visual disturbance.  Respiratory: Negative for cough, shortness of breath and wheezing.   Cardiovascular: Negative for chest pain, palpitations and leg swelling.  Gastrointestinal: Negative for abdominal pain.       No changes in bowels  Genitourinary: Negative for dysuria  and hematuria.  Musculoskeletal: Positive for back pain.  Neurological: Positive for weakness (legs only, not in arms) and numbness (right lower leg). Negative for dizziness, light-headedness and headaches.       Objective:   Filed Vitals:   02/03/16 1412  BP: 150/76  Pulse: 80  Temp: 97.4 F (36.3 C)  Resp: 16   Filed Weights   02/03/16 1412  Weight: 145 lb (65.772 kg)   Body mass index is 23.41 kg/(m^2).   Physical Exam  Constitutional: He appears well-developed and well-nourished. No distress.  appears fatigued and does have a hard time staying awake at the end of the visit. He has not slept well for the past few nights  HENT:  Head: Normocephalic and atraumatic.  Neck: Neck supple. No tracheal deviation present. No thyromegaly present.  Cardiovascular: Normal rate, regular rhythm and normal heart sounds.   Pulmonary/Chest: Effort normal and breath sounds normal. No respiratory distress. He has no wheezes. He has no rales.  Abdominal: Soft. He exhibits no distension. There is no tenderness.  Musculoskeletal: He exhibits no edema or tenderness (none in legs or back with palpation).  Lymphadenopathy:    He has no cervical adenopathy.  Neurological:  immunizations lower extremities, normal strength bilateral upper extremities, 4/5 strength lower extremities, slightly decreased patellar reflex on right, normal patellar reflex on left  Skin: He is not diaphoretic.          Assessment & Plan:   Leg weakness, significant fatigue, ? confusion It was difficult to get a good history, but it does not seem that he is experiencing any confusion or change in mental status except for extreme  fatigue because of not sleeping The leg weakness is somewhat unexplained. There is no current back pain and there is no history of back issues, such as spinal stenosis. He had transient back pain that was related to a fall Will check an x-ray of his lumbar spine to rule out lesions Will check basic blood work including CBC, CMP, urinalysis and urine culture to rule out possible infection or metabolic abnormality He will continue to sleep downstairs and will not attempt to go up the stairs because he is high risk for falls We discussed that he may need to consider going to the emergency room depending on his symptoms and with the test results show) might require changing of the brain Since he does not have any confusion. There are no alarming symptoms we will hold off on the emergency room We will call him tomorrow with results they can update [place] on how he is feeling

## 2016-02-03 NOTE — Progress Notes (Signed)
Pre visit review using our clinic review tool, if applicable. No additional management support is needed unless otherwise documented below in the visit note. 

## 2016-02-04 DIAGNOSIS — R252 Cramp and spasm: Secondary | ICD-10-CM | POA: Insufficient documentation

## 2016-02-04 LAB — URINE CULTURE
Colony Count: NO GROWTH
ORGANISM ID, BACTERIA: NO GROWTH

## 2016-02-04 NOTE — Assessment & Plan Note (Signed)
New onset Pt appears stable and at low risk for DVT at this time  After discussion of possible treatment options willing to try Ibuprofen first, gabapentin 100 mg - cautioned about drowsy effects on Monday if ibuprofen not helpful. FU with myself or PCP next week

## 2016-02-04 NOTE — Telephone Encounter (Signed)
Spoke with Dr Tamala Julian and he was able to fit pt in tomorrow morning at 8 but pt has to have help getting to office and would not be able to find help that soon. Please call pt back with any openings Dr Tamala Julian may have. Pt has R knee pain that has progressed and not able to get around without assistance.

## 2016-02-04 NOTE — Telephone Encounter (Signed)
Spoke with pt and wife to inform. Pt stated that he was able to sleep better last night. Pt would like to know what should be done about the knee pain or how to strengthen the knee. Their bedroom is upstairs and he is not able to get up the steps on his own. Please advise.

## 2016-02-04 NOTE — Telephone Encounter (Signed)
Does he want to try PT or we could have him see Dr Tamala Julian or an orthopedic to help figure out what is going on with the knee.

## 2016-02-05 ENCOUNTER — Other Ambulatory Visit: Payer: Self-pay | Admitting: *Deleted

## 2016-02-05 ENCOUNTER — Ambulatory Visit (INDEPENDENT_AMBULATORY_CARE_PROVIDER_SITE_OTHER)
Admission: RE | Admit: 2016-02-05 | Discharge: 2016-02-05 | Disposition: A | Discharge status: Home / Self Care | Payer: MEDICARE | Source: Ambulatory Visit | Attending: Family Medicine | Admitting: Family Medicine

## 2016-02-05 ENCOUNTER — Encounter: Payer: Self-pay | Admitting: Family Medicine

## 2016-02-05 ENCOUNTER — Ambulatory Visit (INDEPENDENT_AMBULATORY_CARE_PROVIDER_SITE_OTHER): Payer: MEDICARE | Admitting: Family Medicine

## 2016-02-05 VITALS — BP 124/76 | HR 72 | Ht 66.0 in | Wt 145.0 lb

## 2016-02-05 DIAGNOSIS — M25561 Pain in right knee: Secondary | ICD-10-CM

## 2016-02-05 DIAGNOSIS — M25562 Pain in left knee: Secondary | ICD-10-CM

## 2016-02-05 DIAGNOSIS — R2689 Other abnormalities of gait and mobility: Secondary | ICD-10-CM

## 2016-02-05 DIAGNOSIS — M25569 Pain in unspecified knee: Secondary | ICD-10-CM

## 2016-02-05 NOTE — Assessment & Plan Note (Signed)
Patient is somewhat of the knee pain. I do think that this more of a contusion from his recent fall. X-rays are unremarkable. Mild effusion noted today. If it no significant improvement in 2-3 weeks affect is seem again and we will try an injection. Do not want to have any other medications with his confusion as well as comorbidities.

## 2016-02-05 NOTE — Progress Notes (Signed)
Pre visit review using our clinic review tool, if applicable. No additional management support is needed unless otherwise documented below in the visit note. 

## 2016-02-05 NOTE — Assessment & Plan Note (Signed)
Formal physical therapy referred, patient given also prescribe him for a walker. We did discuss with patient at great length that I do feel that if patient has any more confusion that further imaging would be necessary. Patient's wife feels like he is at his baseline. Encourage though if any worsening confusion the patient should be seen in the emergency department. Patient is going to start working with physical therapy will hopefully make some improvement. Encourage patient to monitor diet and increase protein intake. Patient can come back in 3 weeks if the pain is not resolved with try a injection in the knee.

## 2016-02-05 NOTE — Patient Instructions (Signed)
Good to see you.  Ice 20 minutes 2 times daily. Usually after activity and before bed. Walker at medical supply store We will get you in PT for balance and coordination.  Capsicin cream 2-3 times daily  Vitamin D 2000 IU daily  Tylenol 500mg  3 times daily  If knee is not better in 3 weeks see me again and we will inject the knee.

## 2016-02-05 NOTE — Progress Notes (Signed)
[facility], [place] [address] Phone: [phone] Subjective:    I'm seeing this patient by the request  Of: Binnie Rail, MD      CC: Right leg pain and cramping and weakness    CHY:IFOYDXAJOI Jack Noble is a 82 y.o. male coming in with complaint of patient does have some difficulty recently. Patient is accompanied with wife who is helping secondary to patient having a aphasia from previous stroke. Patient as well as wife states that it started with some cramping at night. Seem to be worsening over the course of time. Did have a fall recently. Thinks he did fall on the anterior aspect of his right knee. Continues to have more of a right discomfort of the knee. Patient states it comes and goes. Today does not seem to be hurting him at all. Patient's wife states that he was in severe pain last night. Has had difficulty with ambulation. Seems to have significant weakness of the legs bilaterally. States that his cognitive abilities has not changed. Patient denies any type of numbness. Denies any associated back pain. Primary Care Provider Who Did Get Back X-Rays That Were Independently Visualized by Me Showing Only Mild Arthritic Changes.  Patient was also sent for knee x-rays recently. Knee x-rays show no significant bony abnormality.  Patient's wife is concerned because patient has had difficulty getting up the stairs. Has had significant weakness. That is with her bedroom's. Has been sleeping on the ground floor. His lungs patient does not have to go up stairs he seems to do relatively well. Ibuprofen has helped.     Past Medical History  Diagnosis Date  . Dermatophytosis of nail   . Hyperlipidemia   . Hearing loss   . Hyperplasia of prostate without urinary obstruction   . Cataracts, bilateral   . Rosacea     Dr. Marge Duncans  . Allergy   . Glaucoma     Dr. Edilia Bo  . Corneal abrasion     Dr. Ned Clines, [facility]  . Sepsis due  to Escherichia coli (E. coli) ([place]) 05/2011    Morganella also cultured  . Diverticulosis   . TIA (transient ischemic attack) 9/6-05/2012    BP 221/99  . Epistaxis   . Aphasia 12/26/2013    Dr Jannifer Franklin  . History of GI diverticular bleed 710/15   Past Surgical History  Procedure Laterality Date  . Tonsillectomy and adenoidectomy    . Appendectomy      age 62  . Cataract extraction, bilateral  2003    Dr Ishmael Holter (now seen @ [facility])  . Inguinal hernia repair      age 76  . Colonoscopy w/ polypectomy  06/2009    Dr.Jacobs; "miniscule polyp"  . Cholecystectomy  1978    stones  . Upper gi endoscopy  05/2014    negative  . Skin biopsy  12/2014    done by dermatologist    Social History   Social History  . Marital Status: Married    Spouse Name: N/A  . Number of Children: 1  . Years of Education: college   Occupational History  . [employer]    Social History Main Topics  . Smoking status: Never Smoker   . Smokeless tobacco: Never Used  . Alcohol Use: No  . Drug Use: No  . Sexual Activity: Not Asked   Other Topics Concern  . None   Social History Narrative  Lives with his wife.     Allergies  Allergen Reactions  . Amoxicillin     urticaria  . Codeine     Rash Because of a history of documented adverse serious drug reaction;Medi Alert bracelet  is recommended  . Minocycline Hcl     REACTION: nervous/chest pressure (Note: may have affected GE valve)  . Astelin [Azelastine Hcl]     Makes me jittery  . Azelastine Other (See Comments)  . Ether     pneumonia  . Zocor [Simvastatin] Other (See Comments)    Itching w/o, muscle problems and it affected his stomach  . Metoprolol     lightheaded   Family History  Problem Relation Age of Onset  . Stroke Mother 42  . Parkinsonism Brother     Brother died at age at age 43  . Pulmonary embolism Father 78    Post Op  . Diabetes Neg Hx   . Cancer Neg Hx   . COPD Neg Hx   . Heart disease Neg Hx     Past  medical history, social, surgical and family history all reviewed in electronic medical record.  No pertanent information unless stated regarding to the chief complaint.   Review of Systems: No headache, visual changes, nausea, vomiting, diarrhea, constipation, dizziness, abdominal pain, skin rash, fevers, chills, night sweats, weight loss, swollen lymph nodes, body aches, joint swelling, muscle aches, chest pain, shortness of breath, mood changes.   Objective Blood pressure 124/76, pulse 72, height '5\' 6"'  (1.676 m), weight 145 lb (65.772 kg), SpO2 98 %.  General: Patient is alert but not oriented significant aphasia.  HEENT: Pupils equal, extraocular movements intact  Respiratory: Patient's speak in full sentences and does not appear short of breath  Cardiovascular: No lower extremity edema, non tender, no erythema  Skin: Warm dry intact with no signs of infection or rash on extremities or on axial skeleton.  Abdomen: Soft nontender  Neuro: Cranial nerves II through XII are intact, neurovascularly intact in all extremities with 2+ DTRs and 2+ pulses.  Lymph: No lymphadenopathy of posterior or anterior cervical chain or axillae bilaterally.  Gait significant difficulty with balance and coordination.  MSK:  Non tender with full range of motion and good stability and tone of shoulders, elbows, wrist, hip, and ankles bilaterally. Arthritic changes of multiple joints patient does have weakness of 4 out of 5 strength of the legs bilaterally. Muscle wasting noted. Knee: Right Muscle wasting of the legs bilaterally Palpation normal with no warmth, joint line tenderness, patellar tenderness, or condyle tenderness. ROM full in flexion and extension and lower leg rotation. Ligaments with solid consistent endpoints including ACL, PCL, LCL, MCL. Negative Mcmurray's, Apley's, and Thessalonian tests. Non painful patellar compression. Patellar glide without crepitus. Patellar and quadriceps tendons  unremarkable. Hamstring and quadriceps strength is normal.  Contralateral knee unremarkable   Impression and Recommendations:     This case required medical decision making of moderate complexity.      Note: This dictation was prepared with Dragon dictation along with smaller phrase technology. Any transcriptional errors that result from this process are unintentional.

## 2016-02-12 ENCOUNTER — Encounter: Payer: Self-pay | Admitting: Physical Therapy

## 2016-02-12 ENCOUNTER — Ambulatory Visit: Payer: MEDICARE | Attending: Family Medicine | Admitting: Physical Therapy

## 2016-02-12 DIAGNOSIS — R269 Unspecified abnormalities of gait and mobility: Secondary | ICD-10-CM | POA: Insufficient documentation

## 2016-02-12 DIAGNOSIS — R29898 Other symptoms and signs involving the musculoskeletal system: Secondary | ICD-10-CM | POA: Diagnosis not present

## 2016-02-12 NOTE — Therapy (Signed)
[facility] [address] [place], [place], [address] Phone: [phone]   Fax:  [phone]  Physical Therapy Evaluation  Patient Details  Name: Jack Noble MRN: [id number] Date of Birth: Apr 13, 1929 Referring Provider: Dr. Hulan Saas  Encounter Date: 02/12/2016      PT End of Session - 02/12/16 1247    Visit Number 1   Number of Visits 10  Medicare   Date for PT Re-Evaluation 04/08/16   Authorization Type g-code 10th visit; Kx modifier 15th visit   PT Start Time 1150   PT Stop Time 1230   PT Time Calculation (min) 40 min   Activity Tolerance Patient tolerated treatment well   Behavior During Therapy [facility] for tasks assessed/performed      Past Medical History  Diagnosis Date  . Dermatophytosis of nail   . Hyperlipidemia   . Hearing loss   . Hyperplasia of prostate without urinary obstruction   . Cataracts, bilateral   . Rosacea     Dr. Marge Duncans  . Allergy   . Glaucoma     Dr. Edilia Bo  . Corneal abrasion     Dr. Ned Clines, [facility]  . Sepsis due to Escherichia coli (E. coli) ([place]) 05/2011    Morganella also cultured  . Diverticulosis   . TIA (transient ischemic attack) 9/6-05/2012    BP 221/99  . Epistaxis   . Aphasia 12/26/2013    Dr Jannifer Franklin  . History of GI diverticular bleed 710/15    Past Surgical History  Procedure Laterality Date  . Tonsillectomy and adenoidectomy    . Appendectomy      age 86  . Cataract extraction, bilateral  2003    Dr Ishmael Holter (now seen @ [place])  . Inguinal hernia repair      age 62  . Colonoscopy w/ polypectomy  06/2009    Dr.Jacobs; "miniscule polyp"  . Cholecystectomy  1978    stones  . Upper gi endoscopy  05/2014    negative  . Skin biopsy  12/2014    done by dermatologist     There were no vitals filed for this visit.  Visit Diagnosis:  Abnormality of gait - Plan: PT plan of care cert/re-cert  Weakness of both lower extremities - Plan: PT plan of  care cert/re-cert      Subjective Assessment - 02/12/16 1206    Subjective Patient started with severe leg cramps 01/31/2016.  Patient fell over due to leaning over and fell backwards on 02/01/2016 and hurt right knee. Wife states patient has short term memory loss.  On 02/02/2016,  fell infront of dishwasher when he missed his chair.  Patient has no knee pain now.    Patient is accompained by: Family member  wife   Diagnostic tests x-rays showed arthritis in right knee   Patient Stated Goals balance   Currently in Pain? No/denies            [facility] PT Assessment - 02/12/16 0001    Assessment   Medical Diagnosis M25.569 Arthralgia of lower leg, unspecified laterality   Referring Provider Dr. Hulan Saas   Onset Date/Surgical Date 01/31/16   Prior Therapy [facility]   Precautions   Precautions Fall   Balance Screen   Has the patient fallen in the past 6 months Yes   How many times? 2   Has the patient had a decrease in activity level because of a fear of falling?  No   Is the  patient reluctant to leave their home because of a fear of falling?  No   Home Ecologist residence   Living Arrangements Spouse/significant other   Available Help at Discharge Family   Type of [place] Access Level entry   [place] Two level   Alternate Level Stairs-Number of Steps [place] - 2 wheels   Prior Function   Level of [place] Retired   Associate Professor   Overall Cognitive Status Within Functional Limits for tasks assessed   Observation/Other Assessments   Focus on Therapeutic Outcomes (FOTO)  53% limitation CK  goal is 37% limitation CJ   ROM / Strength   AROM / PROM / Strength Strength   Strength   Right Hip Extension 3+/5   Right Hip ABduction 3/5   Left Hip Extension 3+/5   Left Hip ABduction 3/5   Right Knee Extension 4/5   Left Knee Extension 4/5    Ambulation/Gait   Ambulation/Gait Yes   Ambulation/Gait Assistance 6: Modified independent (Device/Increase time)   Assistive device --  none but came in with rolling walker   Gait Pattern Step-through pattern  small steps   Stairs Yes   Stair Management Technique Two rails;Alternating pattern;Step to pattern  going up with weight shifted backwards   Timed Up and Go Test   TUG Normal TUG   Normal TUG (seconds) 18   TUG Comments high risk of falls                           PT Education - 02/12/16 1245    Education provided Yes   Education Details when walking in public he is to use his walker.  He can go up the stairs slowly with railings and wife supervising   Person(s) Educated Patient;Spouse   Methods Explanation   Comprehension Verbalized understanding          PT Short Term Goals - 02/12/16 1259    PT SHORT TERM GOAL #1   Title TUG  score is </= 15 seconds to improve balance   Time 4   Period Weeks   Status New   PT SHORT TERM GOAL #2   Title go up and down steps with 2 rails with no assistance due to improve balance and strength   Time 4   Period Weeks   Status New   PT SHORT TERM GOAL #3   Title indpenedent with HEP with wife helping   Time 4   Period Weeks   Status New           PT Long Term Goals - 02/12/16 1301    PT LONG TERM GOAL #1   Title independent with HEP with wife assistance for balance and leg strengthening exercises   Time 8   Period Weeks   Status New   PT LONG TERM GOAL #2   Title TUG </= 13 seconds   Time 8   Period Weeks   Status New   PT LONG TERM GOAL #3   Title bil. hip and knee strength is 4/5 so he is able to walk for 500 feet without an assistive device   Time 8   Period Weeks   Status New               Plan - 02/12/16 1248    Clinical  Impression Statement Patient is a 81 year old male with diagnosis of Arthralgia of lower leg, unspecified laterality. Pateint fell on 01/31/2016 when he hurt  his right knee.  Patient fell again on 02/05/2016 when missing a chair while sitting down.  Patient reports no right knee pain anymore.  Patient is using a rolling walker in publc but not in the home.  Patient has a neighbor come to supervise him going up and down the stairs.  Bilateral hip abduction is 3+/5 and extension is 3/5.  TUG is 18 seconds indicating high risk of falls.  When going up stairs he uses 2 rails, step over step and step to step, and will have his weight shifting backwards. Patient ambulates slowly and with small steps. Bilateral  quad strength is 4/5. Patient would benefit from physcial therapy to work on strength and balance. Patient is of low complexity.    Pt will benefit from skilled therapeutic intervention in order to improve on the following deficits Abnormal gait;Decreased endurance;Decreased activity tolerance;Difficulty walking;Decreased strength   Rehab Potential Excellent   Clinical Impairments Affecting Rehab Potential Short term memory trouble, glaucoma in right eye   PT Frequency 2x / week   PT Duration 8 weeks   PT Treatment/Interventions Balance training;Therapeutic exercise;Therapeutic activities;Functional mobility training;Stair training;Gait training;Neuromuscular re-education;Patient/family education;Manual techniques   PT Next Visit Plan balance exercises, standing hip strength, cardiovascular ex, stair training, gait training   PT Home Exercise Plan tips to avoid falls, standing hip exercise for HEP   Recommended Other Services None   Consulted and Agree with Plan of Care Patient          G-Codes - 03/10/16 1302    Functional Assessment Tool Used FOTO score is 53% limitation  goal is 37% limitation   Functional Limitation Other PT primary   Other PT Primary Current Status [id number]) At least 40 percent but less than 60 percent impaired, limited or restricted   Other PT Primary Goal Status [id number]) At least 20 percent but less than 40 percent impaired,  limited or restricted       Problem List Patient Active Problem List   Diagnosis Date Noted  . Poor balance 02/05/2016  . Right knee pain 02/05/2016  . Leg cramps 02/04/2016  . Essential hypertension, benign 12/26/2015  . Fatigue 09/19/2015  . Loss of weight 08/14/2015  . GERD (gastroesophageal reflux disease) 06/06/2015  . Skin cancer 09/14/2014  . History of GI diverticular bleed 06/08/2014  . BRBPR (bright red blood per rectum) 06/01/2014  . Aphasia 12/26/2013  . History of short term memory loss 09/01/2013  . Anemia, unspecified 08/04/2013  . Hypoalbuminemia 08/04/2013  . Severe sepsis ([place]) 07/24/2013  . BPH (benign prostatic hyperplasia) 07/24/2013  . Other and unspecified hyperlipidemia 05/18/2013  . Glaucoma   . Incisional hernia, with obstruction, without gangrene 08/25/2010  . DIVERTICULOSIS, COLON 07/17/2009  . COLONIC POLYPS, HX OF 07/17/2009  . ALLERGIC RHINITIS 10/16/2008  . Other abnormal glucose 10/16/2008  . LIPOMAS, MULTIPLE 11/04/2007  . Rosacea 05/12/2007    Earlie Counts, PT 2016/03/10 1:06 PM   [facility] [address] [place], [place], [address] Phone: [phone]   Fax:  [phone]  Name: Jack Noble MRN: [id number] Date of Birth: September 30, 1929

## 2016-02-13 ENCOUNTER — Ambulatory Visit: Payer: MEDICARE | Admitting: Physical Therapy

## 2016-02-13 DIAGNOSIS — R269 Unspecified abnormalities of gait and mobility: Secondary | ICD-10-CM

## 2016-02-13 DIAGNOSIS — R29898 Other symptoms and signs involving the musculoskeletal system: Secondary | ICD-10-CM

## 2016-02-13 NOTE — Patient Instructions (Signed)
Areej Tayler PT [facility] [address], [address] [place], [place] [address] Phone # [phone] Fax [phone]    

## 2016-02-13 NOTE — Therapy (Signed)
[facility] [address], [place], [place], [address] Phone: [phone]   Fax:  [phone]  Physical Therapy Treatment  Patient Details  Name: Jack Noble MRN: [id number] Date of Birth: January 01, 1929 Referring Provider: Dr. Hulan Saas  Encounter Date: 02/13/2016      PT End of Session - 02/13/16 1433    Visit Number 2   Number of Visits 10   Date for PT Re-Evaluation 04/08/16   Authorization Type g-code 10th visit; Kx modifier 15th visit   PT Start Time 1400   PT Stop Time 1445   PT Time Calculation (min) 45 min   Activity Tolerance Patient tolerated treatment well      Past Medical History  Diagnosis Date  . Dermatophytosis of nail   . Hyperlipidemia   . Hearing loss   . Hyperplasia of prostate without urinary obstruction   . Cataracts, bilateral   . Rosacea     Dr. Marge Duncans  . Allergy   . Glaucoma     Dr. Edilia Bo  . Corneal abrasion     Dr. Ned Clines, [facility]  . Sepsis due to Escherichia coli (E. coli) ([place]) 05/2011    Morganella also cultured  . Diverticulosis   . TIA (transient ischemic attack) 9/6-05/2012    BP 221/99  . Epistaxis   . Aphasia 12/26/2013    Dr Jannifer Franklin  . History of GI diverticular bleed 710/15    Past Surgical History  Procedure Laterality Date  . Tonsillectomy and adenoidectomy    . Appendectomy      age 41  . Cataract extraction, bilateral  2003    Dr Ishmael Holter (now seen @ [facility])  . Inguinal hernia repair      age 59  . Colonoscopy w/ polypectomy  06/2009    Dr.Jacobs; "miniscule polyp"  . Cholecystectomy  1978    stones  . Upper gi endoscopy  05/2014    negative  . Skin biopsy  12/2014    done by dermatologist     There were no vitals filed for this visit.  Visit Diagnosis:  Abnormality of gait  Weakness of both lower extremities      Subjective Assessment - 02/12/16 1206    Subjective Patient started with severe leg cramps 01/31/2016.  Patient fell  over due to leaning over and fell backwards on 02/01/2016 and hurt right knee. Wife states patient has short term memory loss.  On 02/02/2016,  fell infront of dishwasher when he missed his chair.  Patient has no knee pain now.    Patient is accompained by: Family member  wife   Diagnostic tests x-rays showed arthritis in right knee   Patient Stated Goals balance   Currently in Pain? No/denies                         [facility] Adult PT Treatment/Exercise - 02/13/16 0001    Knee/Hip Exercises: Stretches   Active Hamstring Stretch Right;Left;30 seconds  seated with LE  on box and toe taps   Knee/Hip Exercises: Aerobic   Nustep Nu-Step L1 8 min   Knee/Hip Exercises: Standing   Heel Raises Both;10 reps   Hip ADduction Right;Left;1 set;10 reps   Hip Extension AROM;Right;Left;1 set;10 reps   Knee/Hip Exercises: Seated   Long Arc Quad Strengthening;Right;Left;1 set;10 reps   Long Arc Quad Weight 2 lbs.   Other Seated Knee/Hip Exercises march 2# 10x   Marching --  seated  rocker board PF/DF 20x   Sit to Sand 1 set;10 reps;without UE support                PT Education - 03/07/2016 1245    Education provided Yes   Education Details when walking in public he is to use his walker.  He can go up the stairs slowly with railings and wife supervising   Person(s) Educated Patient;Spouse   Methods Explanation   Comprehension Verbalized understanding          PT Short Term Goals - 03-07-2016 1259    PT SHORT TERM GOAL #1   Title TUG  score is </= 15 seconds to improve balance   Time 4   Period Weeks   Status New   PT SHORT TERM GOAL #2   Title go up and down steps with 2 rails with no assistance due to improve balance and strength   Time 4   Period Weeks   Status New   PT SHORT TERM GOAL #3   Title indpenedent with HEP with wife helping   Time 4   Period Weeks   Status New           PT Long Term Goals - 2016-03-07 1301    PT LONG TERM GOAL #1   Title  independent with HEP with wife assistance for balance and leg strengthening exercises   Time 8   Period Weeks   Status New   PT LONG TERM GOAL #2   Title TUG </= 13 seconds   Time 8   Period Weeks   Status New   PT LONG TERM GOAL #3   Title bil. hip and knee strength is 4/5 so he is able to walk for 500 feet without an assistive device   Time 8   Period Weeks   Status New               Plan - 02/13/16 1433    Clinical Impression Statement The patient has difficulty following moderate and complex verbal cues.  Able to follow simple directions at times with repeat verbal instructions and tactile cues.  No complaints of pain or muscle cramps.  Close supervision needed for safety.  Cognitive issues may contribute to fall risk.     PT Next Visit Plan balance exercises, standing hip strength, cardiovascular ex, stair training, gait training          G-Codes - 03/07/16 1302    Functional Assessment Tool Used FOTO score is 53% limitation  goal is 37% limitation   Functional Limitation Other PT primary   Other PT Primary Current Status [id number]) At least 40 percent but less than 60 percent impaired, limited or restricted   Other PT Primary Goal Status [id number]) At least 20 percent but less than 40 percent impaired, limited or restricted      Problem List Patient Active Problem List   Diagnosis Date Noted  . Poor balance 02/05/2016  . Right knee pain 02/05/2016  . Leg cramps 02/04/2016  . Essential hypertension, benign 12/26/2015  . Fatigue 09/19/2015  . Loss of weight 08/14/2015  . GERD (gastroesophageal reflux disease) 06/06/2015  . Skin cancer 09/14/2014  . History of GI diverticular bleed 06/08/2014  . BRBPR (bright red blood per rectum) 06/01/2014  . Aphasia 12/26/2013  . History of short term memory loss 09/01/2013  . Anemia, unspecified 08/04/2013  . Hypoalbuminemia 08/04/2013  . Severe sepsis ([place]) 07/24/2013  . BPH (benign prostatic hyperplasia) 07/24/2013  .  Other and unspecified hyperlipidemia 05/18/2013  . Glaucoma   . Incisional hernia, with obstruction, without gangrene 08/25/2010  . DIVERTICULOSIS, COLON 07/17/2009  . COLONIC POLYPS, HX OF 07/17/2009  . ALLERGIC RHINITIS 10/16/2008  . Other abnormal glucose 10/16/2008  . LIPOMAS, MULTIPLE 11/04/2007  . Rosacea 05/12/2007    Alvera Singh 02/13/2016, 3:46 PM  [facility] [address], [place], [place], [address] Phone: [phone]   Fax:  [phone]  Name: Jack Noble MRN: [id number] Date of Birth: January 06, 1929    Ruben Im, PT 02/13/2016 3:47 PM Phone: [phone] Fax: [phone]

## 2016-02-18 ENCOUNTER — Telehealth: Payer: Self-pay | Admitting: Family Medicine

## 2016-02-18 ENCOUNTER — Encounter: Payer: Self-pay | Admitting: Family Medicine

## 2016-02-18 ENCOUNTER — Ambulatory Visit (INDEPENDENT_AMBULATORY_CARE_PROVIDER_SITE_OTHER): Payer: MEDICARE | Admitting: Family Medicine

## 2016-02-18 VITALS — BP 120/80 | HR 62 | Wt 143.0 lb

## 2016-02-18 DIAGNOSIS — M25561 Pain in right knee: Secondary | ICD-10-CM | POA: Diagnosis not present

## 2016-02-18 NOTE — Telephone Encounter (Signed)
Spoke with pt's wife. She is going to bring him in today at 315p.

## 2016-02-18 NOTE — Assessment & Plan Note (Signed)
Gave injection for likely internal derangement. Do not see anything significant on exam the patient was much more tender today. Hopefully this will be therapeutic. Patient was walking significantly better after the injection. Encourage him to try formal physical therapy again. Worsening symptoms again then told him that he can discontinue. We discussed the icing as well as the over-the-counter medications. Patient will follow-up again in 3 weeks. He could be a candidate for viscous supple mentation. Also if continuing to have trouble we may went to consider looking at his back or any other radicular symptoms that could be contributing to the muscle wasting. Patient will come back and see me again in 3 weeks for further evaluation and treatment.

## 2016-02-18 NOTE — Patient Instructions (Signed)
Good to see yo u I hope the injection helps Ice 10 minutes 2 times a day can help Give a week off from physical therapy and then try again  Continue the vitamins See me again in 1 month.

## 2016-02-18 NOTE — Telephone Encounter (Signed)
Would like to know if patient can be worked in to be seen today.

## 2016-02-18 NOTE — Telephone Encounter (Signed)
States that patient was referred for PT but knee pain has increased since the start of PT.  Would like to know if patient is to continue PT?

## 2016-02-18 NOTE — Progress Notes (Signed)
[facility] [address] [place], [place] [address] Phone: [phone] Subjective:    I'm seeing this patient by the request  Of: Binnie Rail, MD      CC: Right leg follow-up   Jack Noble is a 82 y.o. male coming in with complaint of patient does have some difficulty recently. Patient was seen previously and did have some mild bony abnormality of the knee with very mild arthritis. It appeared the patient was having more weakness in the legs and was sent to formal physical therapy. Patient went one time and unfortunately had worsening pain. States that it seemed to be more localized around the knee. Denies is much cramping in the legs that he was having previously. Patient states that it is affecting his daily activities. Walking with the aid of a walker. States that it may have even been associated with some swelling.  Patient was also sent for knee x-rays recently. Knee x-rays show no significant bony abnormality.      Past Medical History  Diagnosis Date  . Dermatophytosis of nail   . Hyperlipidemia   . Hearing loss   . Hyperplasia of prostate without urinary obstruction   . Cataracts, bilateral   . Rosacea     Dr. Marge Duncans  . Allergy   . Glaucoma     Dr. Edilia Bo  . Corneal abrasion     Dr. Ned Clines, [facility]  . Sepsis due to Escherichia coli (E. coli) ([place]) 05/2011    Morganella also cultured  . Diverticulosis   . TIA (transient ischemic attack) 9/6-05/2012    BP 221/99  . Epistaxis   . Aphasia 12/26/2013    Dr Jannifer Franklin  . History of GI diverticular bleed 710/15   Past Surgical History  Procedure Laterality Date  . Tonsillectomy and adenoidectomy    . Appendectomy      age 26  . Cataract extraction, bilateral  2003    Dr Ishmael Holter (now seen @ [facility])  . Inguinal hernia repair      age 10  . Colonoscopy w/ polypectomy  06/2009    Dr.Jacobs; "miniscule polyp"  . Cholecystectomy  1978    stones  . Upper gi  endoscopy  05/2014    negative  . Skin biopsy  12/2014    done by dermatologist    Social History   Social History  . Marital Status: Married    Spouse Name: N/A  . Number of Children: 1  . Years of Education: college   Occupational History  . [employer]    Social History Main Topics  . Smoking status: Never Smoker   . Smokeless tobacco: Never Used  . Alcohol Use: No  . Drug Use: No  . Sexual Activity: Not Asked   Other Topics Concern  . None   Social History Narrative   Lives with his wife.     Allergies  Allergen Reactions  . Amoxicillin     urticaria  . Codeine     Rash Because of a history of documented adverse serious drug reaction;Medi Alert bracelet  is recommended  . Minocycline Hcl     REACTION: nervous/chest pressure (Note: may have affected GE valve)  . Astelin [Azelastine Hcl]     Makes me jittery  . Azelastine Other (See Comments)  . Ether     pneumonia  . Zocor [Simvastatin] Other (See Comments)    Itching w/o, muscle problems and it affected his stomach  .  Metoprolol     lightheaded   Family History  Problem Relation Age of Onset  . Stroke Mother 45  . Parkinsonism Brother     Brother died at age at age 54  . Pulmonary embolism Father 72    Post Op  . Diabetes Neg Hx   . Cancer Neg Hx   . COPD Neg Hx   . Heart disease Neg Hx     Past medical history, social, surgical and family history all reviewed in electronic medical record.  No pertanent information unless stated regarding to the chief complaint.   Review of Systems: No headache, visual changes, nausea, vomiting, diarrhea, constipation, dizziness, abdominal pain, skin rash, fevers, chills, night sweats, weight loss, swollen lymph nodes, body aches, joint swelling, muscle aches, chest pain, shortness of breath, mood changes.   Objective Blood pressure 120/80, pulse 62, weight 143 lb (64.864 kg).  General: Patient is alert but not oriented significant aphasia.  HEENT:  Pupils equal, extraocular movements intact  Respiratory: Patient's speak in full sentences and does not appear short of breath  Cardiovascular: No lower extremity edema, non tender, no erythema  Skin: Warm dry intact with no signs of infection or rash on extremities or on axial skeleton.  Abdomen: Soft nontender  Neuro: Cranial nerves II through XII are intact, neurovascularly intact in all extremities with 2+ DTRs and 2+ pulses.  Lymph: No lymphadenopathy of posterior or anterior cervical chain or axillae bilaterally.  Gait significant difficulty with balance and coordination.  MSK:  Non tender with full range of motion and good stability and tone of shoulders, elbows, wrist, hip, and ankles bilaterally. Arthritic changes of multiple joints patient does have weakness of 4 out of 5 strength of the legs bilaterally. Muscle wasting noted. Knee: Right Muscle wasting of the legs bilaterally Severe tenderness to palpation over the medial joint line ROM full in flexion and extension and lower leg rotation. Ligaments with solid consistent endpoints including ACL, PCL, LCL, MCL. Negative Mcmurray's, Apley's, and Thessalonian tests. Mild painful patellar compression. Patellar glide without crepitus. Patellar and quadriceps tendons unremarkable. Hamstring and quadriceps strength is normal.  Contralateral knee unremarkable This is worse than previous exam.  After informed written and verbal consent, patient was seated on exam table. Right knee was prepped with alcohol swab and utilizing anterolateral approach, patient's right knee space was injected with 4:1  marcaine 0.5%: Kenalog 19m/dL. Patient tolerated the procedure well without immediate complications.   Impression and Recommendations:     This case required medical decision making of moderate complexity.      Note: This dictation was prepared with Dragon dictation along with smaller phrase technology. Any transcriptional errors that  result from this process are unintentional.

## 2016-02-19 ENCOUNTER — Encounter: Payer: MEDICARE | Admitting: Physical Therapy

## 2016-02-19 ENCOUNTER — Ambulatory Visit: Payer: MEDICARE | Admitting: Physical Therapy

## 2016-02-24 ENCOUNTER — Ambulatory Visit: Payer: MEDICARE | Admitting: Physical Therapy

## 2016-02-24 ENCOUNTER — Ambulatory Visit: Payer: MEDICARE | Admitting: Internal Medicine

## 2016-02-25 ENCOUNTER — Encounter: Payer: Self-pay | Admitting: Internal Medicine

## 2016-02-25 ENCOUNTER — Ambulatory Visit: Payer: MEDICARE | Admitting: Family Medicine

## 2016-02-25 ENCOUNTER — Ambulatory Visit (INDEPENDENT_AMBULATORY_CARE_PROVIDER_SITE_OTHER): Payer: MEDICARE | Admitting: Internal Medicine

## 2016-02-25 VITALS — BP 128/74 | HR 63 | Temp 97.9°F | Resp 16 | Wt 146.0 lb

## 2016-02-25 DIAGNOSIS — K648 Other hemorrhoids: Secondary | ICD-10-CM | POA: Diagnosis not present

## 2016-02-25 DIAGNOSIS — R410 Disorientation, unspecified: Secondary | ICD-10-CM | POA: Diagnosis not present

## 2016-02-25 DIAGNOSIS — M25561 Pain in right knee: Secondary | ICD-10-CM | POA: Diagnosis not present

## 2016-02-25 DIAGNOSIS — K644 Residual hemorrhoidal skin tags: Secondary | ICD-10-CM | POA: Insufficient documentation

## 2016-02-25 DIAGNOSIS — I10 Essential (primary) hypertension: Secondary | ICD-10-CM | POA: Diagnosis not present

## 2016-02-25 LAB — POCT URINALYSIS DIPSTICK
BILIRUBIN UA: NEGATIVE
GLUCOSE UA: NEGATIVE
KETONES UA: NEGATIVE
Leukocytes, UA: NEGATIVE
Nitrite, UA: NEGATIVE
Protein, UA: NEGATIVE
RBC UA: NEGATIVE
SPEC GRAV UA: 1.025
UROBILINOGEN UA: 0.2
pH, UA: 6

## 2016-02-25 NOTE — Assessment & Plan Note (Signed)
BP well controlled Current regimen effective and well tolerated Continue current medications at current doses  

## 2016-02-25 NOTE — Patient Instructions (Addendum)
  Your urine is normal - there is no infection.   Medications reviewed and updated.  No changes recommended at this time.   Please followup in 4 months

## 2016-02-25 NOTE — Progress Notes (Signed)
Pre visit review using our clinic review tool, if applicable. No additional management support is needed unless otherwise documented below in the visit note. 

## 2016-02-25 NOTE — Progress Notes (Signed)
Subjective:    Patient ID: Jack Noble, male    DOB: 1929-10-05, 82 y.o.   MRN: [id number]  HPI He is here for an acute visit for confusion.  Hypertension: He is taking his medication daily. He is compliant with a low sodium diet.  He denies chest pain, palpitations, edema, shortness of breath and regular headaches. He is exercising regularly.  He does monitor his blood pressure at home 153/69-124/76.    Hemorrhoids:  He has a flare of hemorrhoids in the past.  He had a large marble in the rectal area.  He denied any rectal pain or bleeding.  He has had some constipation.  He has tried vaseline and sitz baths and it has helped slightly.    Knee pain:  He had the injection in the right knee 3/28.  He denies any pain.  He has some stiffness in the knee at times.  He has been walking ok and can walk short distances with out the walker.  He has not been using the walker as much.  He starts physical therapy Monday.  His balance is getting better.   Confusion:  He has a few episodes of confusion.  It is not consistent and has been transient.  His wife states it typically occurs first thing in the morning or when he is in a different part of the house for long time periods - he gets disorientated.    Medications and allergies reviewed with patient and updated if appropriate.  Patient Active Problem List   Diagnosis Date Noted  . Poor balance 02/05/2016  . Right knee pain 02/05/2016  . Leg cramps 02/04/2016  . Essential hypertension, benign 12/26/2015  . Fatigue 09/19/2015  . Loss of weight 08/14/2015  . GERD (gastroesophageal reflux disease) 06/06/2015  . Skin cancer 09/14/2014  . History of GI diverticular bleed 06/08/2014  . BRBPR (bright red blood per rectum) 06/01/2014  . Aphasia 12/26/2013  . History of short term memory loss 09/01/2013  . Anemia, unspecified 08/04/2013  . Hypoalbuminemia 08/04/2013  . Severe sepsis ([place]) 07/24/2013  . BPH (benign prostatic hyperplasia)  07/24/2013  . Other and unspecified hyperlipidemia 05/18/2013  . Glaucoma   . Incisional hernia, with obstruction, without gangrene 08/25/2010  . DIVERTICULOSIS, COLON 07/17/2009  . COLONIC POLYPS, HX OF 07/17/2009  . ALLERGIC RHINITIS 10/16/2008  . Other abnormal glucose 10/16/2008  . LIPOMAS, MULTIPLE 11/04/2007  . Rosacea 05/12/2007    Current Outpatient Prescriptions on File Prior to Visit  Medication Sig Dispense Refill  . Cholecalciferol (VITAMIN D) 1000 UNITS capsule Take 1,000 Units by mouth every morning.     . finasteride (PROSCAR) 5 MG tablet Take 1 tablet (5 mg total) by mouth daily. 30 tablet 5  . Ibuprofen 200 MG CAPS Take 2 capsules (400 mg total) by mouth 3 (three) times daily with meals as needed. 1 each 0  . ketoconazole (NIZORAL) 2 % cream Apply 1 application topically 2 (two) times daily as needed for irritation. 30 g 0  . mometasone (ELOCON) 0.1 % ointment APPLY TOPICALLY TO AFFECTED AREA(S) TWICE DAILY 45 g 0  . montelukast (SINGULAIR) 10 MG tablet Take 1 tablet (10 mg total) by mouth at bedtime. 30 tablet 1  . Multiple Vitamin (MULTIVITAMIN WITH MINERALS) TABS Take 1 tablet by mouth every morning.     . nystatin-triamcinolone (MYCOLOG II) cream Apply 1 application topically daily as needed (Applies to red places in groin area.). --- Please establish with new PCP for  further refills 30 g 0  . simethicone (MYLICON) 80 MG chewable tablet Chew 1 tablet (80 mg total) by mouth every 6 (six) hours as needed for flatulence. 30 tablet 0  . Timolol Maleate (ISTALOL) 0.5 % (DAILY) SOLN Place 1 drop into the left eye 2 (two) times daily at 8 am and 10 pm.     . valsartan (DIOVAN) 40 MG tablet Take 1 tablet (40 mg total) by mouth daily. 30 tablet 5   No current facility-administered medications on file prior to visit.    Past Medical History  Diagnosis Date  . Dermatophytosis of nail   . Hyperlipidemia   . Hearing loss   . Hyperplasia of prostate without urinary  obstruction   . Cataracts, bilateral   . Rosacea     Dr. Marge Duncans  . Allergy   . Glaucoma     Dr. Edilia Bo  . Corneal abrasion     Dr. Ned Clines, [facility]  . Sepsis due to Escherichia coli (E. coli) ([place]) 05/2011    Morganella also cultured  . Diverticulosis   . TIA (transient ischemic attack) 9/6-05/2012    BP 221/99  . Epistaxis   . Aphasia 12/26/2013    Dr Jannifer Franklin  . History of GI diverticular bleed 710/15    Past Surgical History  Procedure Laterality Date  . Tonsillectomy and adenoidectomy    . Appendectomy      age 65  . Cataract extraction, bilateral  2003    Dr Ishmael Holter (now seen @ [facility])  . Inguinal hernia repair      age 66  . Colonoscopy w/ polypectomy  06/2009    Dr.Jacobs; "miniscule polyp"  . Cholecystectomy  1978    stones  . Upper gi endoscopy  05/2014    negative  . Skin biopsy  12/2014    done by dermatologist     Social History   Social History  . Marital Status: Married    Spouse Name: N/A  . Number of Children: 1  . Years of Education: college   Occupational History  . [employer]    Social History Main Topics  . Smoking status: Never Smoker   . Smokeless tobacco: Never Used  . Alcohol Use: No  . Drug Use: No  . Sexual Activity: Not Asked   Other Topics Concern  . None   Social History Narrative   Lives with his wife.      Family History  Problem Relation Age of Onset  . Stroke Mother 13  . Parkinsonism Brother     Brother died at age at age 54  . Pulmonary embolism Father 31    Post Op  . Diabetes Neg Hx   . Cancer Neg Hx   . COPD Neg Hx   . Heart disease Neg Hx     Review of Systems  Constitutional: Negative for fever and chills.  Respiratory: Negative for cough, shortness of breath and wheezing.   Cardiovascular: Negative for chest pain, palpitations and leg swelling.  Gastrointestinal: Negative for nausea and abdominal pain.  Genitourinary: Negative for dysuria and hematuria.  Neurological: Negative for  dizziness, light-headedness and headaches.  Psychiatric/Behavioral: Positive for confusion (intermittent confusion, occured with fatigue and changes in location).       Objective:   Filed Vitals:   02/25/16 1434  BP: 128/74  Pulse: 63  Temp: 97.9 F (36.6 C)  Resp: 16   Filed Weights   02/25/16 1434  Weight: 146 lb (66.225 kg)  Body mass index is 23.58 kg/(m^2).   Physical Exam Constitutional: Appears well-developed and well-nourished. No distress.  Neck: Neck supple. No tracheal deviation present. No thyromegaly present.  No carotid bruit. No cervical adenopathy.   Cardiovascular: Normal rate, regular rhythm and normal heart sounds.   No murmur heard.  No edema Pulmonary/Chest: Effort normal and breath sounds normal. No respiratory distress. No wheezes.  Rectal:  No external hemorrhoid  - wife states it looks much better       Assessment & Plan:   See Problem List for Assessment and Plan of chronic medical problems.  Follow up in about 4 months, sooner if needed

## 2016-02-25 NOTE — Assessment & Plan Note (Signed)
Much improved s/p injection Will start PT next week Walking without walker, but careful of poor balance

## 2016-02-25 NOTE — Assessment & Plan Note (Signed)
He has had a few episodes of confusion, typically first thing in the morning or if he is in a different part of the house for too long Confusion is not consistent No signs of infection - will check urine dip given his history of urinary sepsis Will have them discuss further with dr Jannifer Franklin - monitor until then

## 2016-02-25 NOTE — Assessment & Plan Note (Signed)
Resolved No further treatment needed Can continue otc topical meds and sitz baths

## 2016-02-26 ENCOUNTER — Encounter: Payer: MEDICARE | Admitting: Physical Therapy

## 2016-02-28 ENCOUNTER — Telehealth: Payer: Self-pay | Admitting: Internal Medicine

## 2016-02-28 NOTE — Telephone Encounter (Signed)
Dr. Quay Burow advises that Jack Noble take Colace per package instructions. Margaret updated and encouraged to call back with any concerns or questions.

## 2016-02-28 NOTE — Telephone Encounter (Signed)
States patient has urge to have a bowl movement but can not.  Spouse believes it has been 4 days.  Would like to know what to give patient to help.   Will send to [facility] as well.

## 2016-02-28 NOTE — Telephone Encounter (Signed)
Patient Name: [name] DOB: 10/11/1929 Initial Comment Caller states her husband has constipation. For a week. Abdominal pain. He started new BP medication in Feb. Nurse Assessment Nurse: Vallery Sa, RN, [place] Date/Time Eilene Ghazi Time): 02/28/2016 10:40:15 AM Confirm and document reason for call. If symptomatic, describe symptoms. You must click the next button to save text entered. ---Caller shares that Daquane's last real bowel movement was about three days ago. He passed a hard round stool this morning. No blood in his bowel movements or blood leaking from the rectum. No fever. No vomiting. He had lower abdominal pain during the last bowel movement. Alert and responsive. Has the patient traveled out of the country within the last 30 days? ---No Does the patient have any new or worsening symptoms? ---Yes Will a triage be completed? ---Yes Related visit to physician within the last 2 weeks? ---No Does the PT have any chronic conditions? (i.e. diabetes, asthma, etc.) ---Yes List chronic conditions. ---High Blood Pressure, Hemorrhoids Is this a behavioral health or substance abuse call? ---No Guidelines Guideline Title Affirmed Question Affirmed Notes Constipation Taking new prescription medication Final Disposition User Call PCP when Office is Open [place], RN, [place] will have him try the Metamucil. She is concerned that his new blood pressure medication is causing constipation. Called the office backline and Lovena Le will assist him further. Referrals REFERRED TO PCP OFFICE Disagree/Comply: Comply

## 2016-03-02 ENCOUNTER — Encounter: Payer: Self-pay | Admitting: Physical Therapy

## 2016-03-02 ENCOUNTER — Ambulatory Visit: Payer: MEDICARE | Attending: Family Medicine | Admitting: Physical Therapy

## 2016-03-02 DIAGNOSIS — R2689 Other abnormalities of gait and mobility: Secondary | ICD-10-CM | POA: Insufficient documentation

## 2016-03-02 DIAGNOSIS — M6281 Muscle weakness (generalized): Secondary | ICD-10-CM | POA: Insufficient documentation

## 2016-03-02 NOTE — Therapy (Addendum)
[facility] [address] [place], [place], [address] Phone: [phone]   Fax:  [phone]  Physical Therapy Treatment  Patient Details  Name: Jack Noble MRN: [id number] Date of Birth: [birth year] Referring Provider: Dr. Hulan Saas  Encounter Date: 03/02/2016      PT End of Session - 03/02/16 1410    Visit Number 3   Number of Visits 10   Date for PT Re-Evaluation 04/08/16   Authorization Type g-code 10th visit; Kx modifier 15th visit   PT Start Time 1403   PT Stop Time 1448   PT Time Calculation (min) 45 min   Activity Tolerance Patient tolerated treatment well   Behavior During Therapy [facility] for tasks assessed/performed  Pt and wife had a lot of questions throughout the session.      Past Medical History  Diagnosis Date  . Dermatophytosis of nail   . Hyperlipidemia   . Hearing loss   . Hyperplasia of prostate without urinary obstruction   . Cataracts, bilateral   . Rosacea     Dr. Marge Duncans  . Allergy   . Glaucoma     Dr. Edilia Bo  . Corneal abrasion     Dr. Ned Clines, [facility]  . Sepsis due to Escherichia coli (E. coli) ([place]) 05/2011    Morganella also cultured  . Diverticulosis   . TIA (transient ischemic attack) 9/6-05/2012    BP 221/99  . Epistaxis   . Aphasia 12/26/2013    Dr Jannifer Franklin  . History of GI diverticular bleed 710/15    Past Surgical History  Procedure Laterality Date  . Tonsillectomy and adenoidectomy    . Appendectomy      age 63  . Cataract extraction, bilateral  2003    Dr Ishmael Holter (now seen @ [facility])  . Inguinal hernia repair      age 69  . Colonoscopy w/ polypectomy  06/2009    Dr.Jacobs; "miniscule polyp"  . Cholecystectomy  1978    stones  . Upper gi endoscopy  05/2014    negative  . Skin biopsy  12/2014    done by dermatologist     There were no vitals filed for this visit.      Subjective Assessment - 03/02/16 1408    Subjective Wife reports her husbands  legs were very sore after his last HEP session. Next day it was better. They are concerned he is doing too much. He reports getting an injection in his left knee from Dr Tamala Julian.    Currently in Pain? No/denies   Multiple Pain Sites No            [facility] PT Assessment - 03/02/16 0001    Assessment   Medical Diagnosis M25.569 Arthralgia of lower leg, unspecified laterality   Onset Date/Surgical Date 01/31/16   Prior Therapy NOne   Precautions   Precautions Fall   Balance Screen   Has the patient fallen in the past 6 months Yes   How many times? 2   Has the patient had a decrease in activity level because of a fear of falling?  No   Is the patient reluctant to leave their home because of a fear of falling?  No   Home Environment   Living Environment Private residence   Living Arrangements Spouse/significant other   Available Help at Discharge Family   Type of [place] Access Level entry   [place] Two level   Alternate  Level Stairs-Number of Steps 13   Alternate Level Stairs-Rails Right;Left   Home Equipment Walker - 2 wheels   Prior Function   Level of Independence Independent   Vocation Retired   Charity fundraiser Status Within Functional Limits for tasks assessed   Observation/Other Assessments   Focus on Therapeutic Outcomes (FOTO)  53% limitation CK  goal is 37% limitation CJ   Strength   Right Hip Extension 3+/5   Right Hip ABduction 3/5   Left Hip Extension 3+/5   Left Hip ABduction 3/5   Right Knee Extension 4/5   Left Knee Extension 4/5   Ambulation/Gait   Ambulation/Gait Yes   Ambulation/Gait Assistance 6: Modified independent (Device/Increase time)   Assistive device --  none but came in with rolling walker   Gait Pattern Step-through pattern  small steps   Stairs Yes   Stair Management Technique Two rails;Alternating pattern;Step to pattern  going up with weight shifted backwards   Timed Up and Go Test   TUG Normal TUG   Normal  TUG (seconds) 18   TUG Comments high risk of falls                     [facility] Adult PT Treatment/Exercise - 03/02/16 0001    Knee/Hip Exercises: Aerobic   Nustep Nu-Step L1 8 min   Knee/Hip Exercises: Standing   Hip ADduction AROM;Strengthening;Both;1 set;5 reps   Forward Step Up Both;2 sets;5 sets   Knee/Hip Exercises: Seated   Long Arc Quad Strengthening;Both;1 set;10 reps   Long Arc Quad Weight 2 lbs.   Sit to [employer] at ankles x 2 min                  PT Short Term Goals - 03/02/16 1437    PT SHORT TERM GOAL #1   Title TUG  score is </= 15 seconds to improve balance   Time 4   Period Weeks   Status On-going   PT SHORT TERM GOAL #2   Title go up and down steps with 2 rails with no assistance due to improve balance and strength   Time 4   Period Weeks   Status On-going   PT SHORT TERM GOAL #3   Title indpenedent with HEP with wife helping   Time 4   Period Weeks   Status Achieved           PT Long Term Goals - 03/02/16 2207    PT LONG TERM GOAL #1   Title independent with HEP with wife assistance for balance and leg strengthening exercises   Time 8   Period Weeks   Status New   PT LONG TERM GOAL #2   Title TUG </= 13 seconds   Time 8   Period Weeks   Status New   PT LONG TERM GOAL #3   Title bil. hip and knee strength is 4/5 so he is able to walk for 500 feet without an assistive device   Time 8   Period Weeks   Status New               Plan - 03/02/16 1436    Clinical Impression Statement Pt and wife concerned about soreness/pain after pt does exercises. We discussed what kind of soreness would be appropriate post and what kind would not. He  complained of "feeling his muscles" during the treatment but no pain. Patient has weakness in bilateral hips.  TUG score is 18 seconds indicating a risk of falls.  Patient would benefit from physical therapy to improve strength and balance.    Rehab Potential Excellent    Clinical Impairments Affecting Rehab Potential Short term memory trouble, glaucoma in right eye   PT Frequency 2x / week   PT Duration 8 weeks   PT Treatment/Interventions Balance training;Therapeutic exercise;Therapeutic activities;Functional mobility training;Stair training;Gait training;Neuromuscular re-education;Patient/family education;Manual techniques   PT Next Visit Plan balance exercises, standing hip strength, cardiovascular ex, stair training, gait training   Consulted and Agree with Plan of Care Patient      Patient will benefit from skilled therapeutic intervention in order to improve the following deficits and impairments:  Abnormal gait, Decreased endurance, Decreased activity tolerance, Difficulty walking, Decreased strength  Visit Diagnosis: Other abnormalities of gait and mobility - Plan: PT plan of care cert/re-cert  Muscle weakness (generalized) - Plan: PT plan of care cert/re-cert     Problem List Patient Active Problem List   Diagnosis Date Noted  . External hemorrhoid 02/25/2016  . Confusion 02/25/2016  . Poor balance 02/05/2016  . Right knee pain 02/05/2016  . Leg cramps 02/04/2016  . Essential hypertension, benign 12/26/2015  . Fatigue 09/19/2015  . Loss of weight 08/14/2015  . GERD (gastroesophageal reflux disease) 06/06/2015  . Skin cancer 09/14/2014  . History of GI diverticular bleed 06/08/2014  . BRBPR (bright red blood per rectum) 06/01/2014  . Aphasia 12/26/2013  . History of short term memory loss 09/01/2013  . Anemia, unspecified 08/04/2013  . Hypoalbuminemia 08/04/2013  . Severe sepsis ([place]) 07/24/2013  . BPH (benign prostatic hyperplasia) 07/24/2013  . Other and unspecified hyperlipidemia 05/18/2013  . Glaucoma   . Incisional hernia, with obstruction, without gangrene 08/25/2010  . DIVERTICULOSIS, COLON 07/17/2009  . COLONIC POLYPS, HX OF 07/17/2009  . ALLERGIC RHINITIS 10/16/2008  . Other abnormal glucose 10/16/2008  . LIPOMAS,  MULTIPLE 11/04/2007  . Rosacea 05/12/2007   Earlie Counts, PT 03/02/2016 10:10 PM   GRAY,CHERYL, PTA 03/02/2016, 10:10 PM  [facility] [address], [place], [place], [address] Phone: [phone]   Fax:  [phone]  Name: Jack Noble MRN: [id number] Date of Birth: 1929-06-30

## 2016-03-02 NOTE — Addendum Note (Signed)
Addended by: Earlie Counts F on: 03/02/2016 10:10 PM   Modules accepted: Orders

## 2016-03-04 ENCOUNTER — Encounter: Payer: Self-pay | Admitting: Physical Therapy

## 2016-03-04 ENCOUNTER — Ambulatory Visit: Payer: MEDICARE | Admitting: Physical Therapy

## 2016-03-04 DIAGNOSIS — R2689 Other abnormalities of gait and mobility: Secondary | ICD-10-CM | POA: Diagnosis not present

## 2016-03-04 DIAGNOSIS — M6281 Muscle weakness (generalized): Secondary | ICD-10-CM

## 2016-03-04 NOTE — Therapy (Addendum)
[facility] [address], [place], [place], [address] Phone: [phone]   Fax:  [phone]  Physical Therapy Treatment  Patient Details  Name: Jack Noble MRN: [id number] Date of Birth: 09/06/1929 Referring Provider: Dr. Hulan Saas  Encounter Date: 03/04/2016      PT End of Session - 03/04/16 1410    Visit Number 4   Number of Visits 10   Date for PT Re-Evaluation 04/08/16   Authorization Type g-code 10th visit; Kx modifier 15th visit   PT Start Time 1405   PT Stop Time 1443   PT Time Calculation (min) 38 min   Activity Tolerance Patient tolerated treatment well   Behavior During Therapy [facility] for tasks assessed/performed      Past Medical History  Diagnosis Date  . Dermatophytosis of nail   . Hyperlipidemia   . Hearing loss   . Hyperplasia of prostate without urinary obstruction   . Cataracts, bilateral   . Rosacea     Dr. Marge Duncans  . Allergy   . Glaucoma     Dr. Edilia Bo  . Corneal abrasion     Dr. Ned Clines, [facility]  . Sepsis due to Escherichia coli (E. coli) ([place]) 05/2011    Morganella also cultured  . Diverticulosis   . TIA (transient ischemic attack) 9/6-05/2012    BP 221/99  . Epistaxis   . Aphasia 12/26/2013    Dr Jannifer Franklin  . History of GI diverticular bleed 710/15    Past Surgical History  Procedure Laterality Date  . Tonsillectomy and adenoidectomy    . Appendectomy      age 82  . Cataract extraction, bilateral  2003    Dr Ishmael Holter (now seen @ [place])  . Inguinal hernia repair      age 82  . Colonoscopy w/ polypectomy  06/2009    Dr.Jacobs; "miniscule polyp"  . Cholecystectomy  1978    stones  . Upper gi endoscopy  05/2014    negative  . Skin biopsy  12/2014    done by dermatologist     There were no vitals filed for this visit.      Subjective Assessment - 03/04/16 1411    Subjective I feel good.  I feel stronger.    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 No/denies      G-code: Functional assessment tool used FOTO score is 535 limitation, Functional limitation is other PT primary, goal status is CJ, discharge status is CK. Earlie Counts, PT 03/05/2016 3:03 PM                     [facility] Adult PT Treatment/Exercise - 03/04/16 0001    Knee/Hip Exercises: Aerobic   Nustep Nu-Step L1 10 min   Knee/Hip Exercises: Standing   Hip ADduction AROM;Strengthening;Both;1 set;5 reps   Knee/Hip Exercises: Seated   Long Arc Quad Strengthening;Both;1 set;10 reps   Long Arc Quad Weight 2 lbs.   Other Seated Knee/Hip Exercises march 2# 10x   Marching --  seated rocker board PF/DF 20x                PT Education - 03/04/16 1442    Education provided No          PT Short Term Goals - 03/02/16 1437    PT SHORT TERM GOAL #1   Title TUG  score is </= 15  seconds to improve balance   Time 4   Period Weeks   Status On-going   PT SHORT TERM GOAL #2   Title go up and down steps with 2 rails with no assistance due to improve balance and strength   Time 4   Period Weeks   Status On-going   PT SHORT TERM GOAL #3   Title indpenedent with HEP with wife helping   Time 4   Period Weeks   Status Achieved           PT Long Term Goals - 03/02/16 2207    PT LONG TERM GOAL #1   Title independent with HEP with wife assistance for balance and leg strengthening exercises   Time 8   Period Weeks   Status New   PT LONG TERM GOAL #2   Title TUG </= 13 seconds   Time 8   Period Weeks   Status New   PT LONG TERM GOAL #3   Title bil. hip and knee strength is 4/5 so he is able to walk for 500 feet without an assistive device   Time 8   Period Weeks   Status New               Plan - 03/04/16 1442    Clinical Impression Statement Patient complained of some left hip pain after exercise but no change in routine.  Patient able to walk straighter  and more endurance.   Patient still requires rests so he is not sore aftre therapy.  Patient will bnefit form physical therapy to reduce pain and improve gait.    Rehab Potential Excellent   Clinical Impairments Affecting Rehab Potential Short term memory trouble, glaucoma in right eye   PT Frequency 2x / week   PT Duration 8 weeks   PT Treatment/Interventions Balance training;Therapeutic exercise;Therapeutic activities;Functional mobility training;Stair training;Gait training;Neuromuscular re-education;Patient/family education;Manual techniques   PT Next Visit Plan balance exercises, standing hip strength, cardiovascular ex, stair training, gait training   PT Home Exercise Plan progress as needed   Consulted and Agree with Plan of Care Patient      Patient will benefit from skilled therapeutic intervention in order to improve the following deficits and impairments:  Abnormal gait, Decreased endurance, Decreased activity tolerance, Difficulty walking, Decreased strength  Visit Diagnosis: Other abnormalities of gait and mobility  Muscle weakness (generalized)     Problem List Patient Active Problem List   Diagnosis Date Noted  . External hemorrhoid 02/25/2016  . Confusion 02/25/2016  . Poor balance 02/05/2016  . Right knee pain 02/05/2016  . Leg cramps 02/04/2016  . Essential hypertension, benign 12/26/2015  . Fatigue 09/19/2015  . Loss of weight 08/14/2015  . GERD (gastroesophageal reflux disease) 06/06/2015  . Skin cancer 09/14/2014  . History of GI diverticular bleed 06/08/2014  . BRBPR (bright red blood per rectum) 06/01/2014  . Aphasia 12/26/2013  . History of short term memory loss 09/01/2013  . Anemia, unspecified 08/04/2013  . Hypoalbuminemia 08/04/2013  . Severe sepsis ([place]) 07/24/2013  . BPH (benign prostatic hyperplasia) 07/24/2013  . Other and unspecified hyperlipidemia 05/18/2013  . Glaucoma   . Incisional hernia, with obstruction, without gangrene 08/25/2010  . DIVERTICULOSIS,  COLON 07/17/2009  . COLONIC POLYPS, HX OF 07/17/2009  . ALLERGIC RHINITIS 10/16/2008  . Other abnormal glucose 10/16/2008  . LIPOMAS, MULTIPLE 11/04/2007  . Rosacea 05/12/2007    Earlie Counts, PT 03/04/2016 2:45 PM   [facility] [address]  [address], [place], [place], [address] Phone: [phone]   Fax:  [phone]  Name: Jack Noble MRN: [id number] Date of Birth: 1929/08/11  PHYSICAL THERAPY DISCHARGE SUMMARY  Visits from Start of Care: 4  Current functional level related to goals / functional outcomes: See above.   Remaining deficits: See above.  Patient wife called on 03/05/2016 to cancel all of his appointments.  He wanted to be discharged due to therapy causing too much pain.    Education / Equipment: HEP  Plan: Patient agrees to discharge.  Patient goals were not met. Patient is being discharged due to the patient's request.  Thank you for the referral. Earlie Counts, PT 03/05/2016 3:07 PM  ?????

## 2016-03-05 ENCOUNTER — Ambulatory Visit (INDEPENDENT_AMBULATORY_CARE_PROVIDER_SITE_OTHER): Payer: MEDICARE | Admitting: Family

## 2016-03-05 ENCOUNTER — Other Ambulatory Visit: Payer: Self-pay | Admitting: Family

## 2016-03-05 ENCOUNTER — Other Ambulatory Visit (INDEPENDENT_AMBULATORY_CARE_PROVIDER_SITE_OTHER): Payer: MEDICARE

## 2016-03-05 ENCOUNTER — Encounter: Payer: Self-pay | Admitting: Family

## 2016-03-05 VITALS — BP 120/80 | HR 62 | Temp 97.4°F | Ht 66.0 in | Wt 143.0 lb

## 2016-03-05 DIAGNOSIS — R109 Unspecified abdominal pain: Secondary | ICD-10-CM | POA: Diagnosis not present

## 2016-03-05 DIAGNOSIS — D649 Anemia, unspecified: Secondary | ICD-10-CM

## 2016-03-05 LAB — CBC WITH DIFFERENTIAL/PLATELET
BASOS ABS: 0 10*3/uL (ref 0.0–0.1)
Basophils Relative: 0.3 % (ref 0.0–3.0)
EOS ABS: 0.1 10*3/uL (ref 0.0–0.7)
Eosinophils Relative: 1.4 % (ref 0.0–5.0)
HEMATOCRIT: 39.9 % (ref 39.0–52.0)
Hemoglobin: 13.4 g/dL (ref 13.0–17.0)
LYMPHS PCT: 23 % (ref 12.0–46.0)
Lymphs Abs: 1.7 10*3/uL (ref 0.7–4.0)
MCHC: 33.5 g/dL (ref 30.0–36.0)
MCV: 96.9 fl (ref 78.0–100.0)
MONOS PCT: 6.4 % (ref 3.0–12.0)
Monocytes Absolute: 0.5 10*3/uL (ref 0.1–1.0)
NEUTROS ABS: 5.2 10*3/uL (ref 1.4–7.7)
Neutrophils Relative %: 68.9 % (ref 43.0–77.0)
PLATELETS: 210 10*3/uL (ref 150.0–400.0)
RBC: 4.11 Mil/uL — AB (ref 4.22–5.81)
RDW: 13.2 % (ref 11.5–15.5)
WBC: 7.5 10*3/uL (ref 4.0–10.5)

## 2016-03-05 LAB — URINALYSIS, ROUTINE W REFLEX MICROSCOPIC
BILIRUBIN URINE: NEGATIVE
HGB URINE DIPSTICK: NEGATIVE
KETONES UR: NEGATIVE
LEUKOCYTES UA: NEGATIVE
NITRITE: NEGATIVE
Specific Gravity, Urine: <=1.005 — AB (ref 1.000–1.030)
Total Protein, Urine: NEGATIVE
UROBILINOGEN UA: 0.2 (ref 0.0–1.0)
Urine Glucose: NEGATIVE
pH: 6.5 (ref 5.0–8.0)

## 2016-03-05 LAB — COMPREHENSIVE METABOLIC PANEL
ALK PHOS: 43 U/L (ref 39–117)
ALT: 17 U/L (ref 0–53)
AST: 20 U/L (ref 0–37)
Albumin: 4.1 g/dL (ref 3.5–5.2)
BILIRUBIN TOTAL: 0.5 mg/dL (ref 0.2–1.2)
BUN: 12 mg/dL (ref 6–23)
CALCIUM: 10 mg/dL (ref 8.4–10.5)
CO2: 30 mEq/L (ref 19–32)
Chloride: 101 mEq/L (ref 96–112)
Creatinine, Ser: 0.72 mg/dL (ref 0.40–1.50)
GFR: 110.24 mL/min (ref >60.00)
GLUCOSE: 93 mg/dL (ref 70–99)
POTASSIUM: 4.2 meq/L (ref 3.5–5.1)
Sodium: 136 mEq/L (ref 135–145)
TOTAL PROTEIN: 6.9 g/dL (ref 6.0–8.3)

## 2016-03-05 NOTE — Progress Notes (Signed)
Pre visit review using our clinic review tool, if applicable. No additional management support is needed unless otherwise documented below in the visit note. 

## 2016-03-05 NOTE — Progress Notes (Deleted)
   Subjective:    Patient ID: Jack Noble, male    DOB: October 04, 1929, 82 y.o.   MRN: [id number]  No chief complaint on file.   HPI   Jack Noble is a 82 y.o. male who  has a past medical history of Dermatophytosis of nail; Hyperlipidemia; Hearing loss; Hyperplasia of prostate without urinary obstruction; Cataracts, bilateral; Rosacea; Allergy; Glaucoma; Corneal abrasion; Sepsis due to Escherichia coli (E. coli) ([place]) (05/2011); Diverticulosis; TIA (transient ischemic attack) (9/6-05/2012); Epistaxis; Aphasia (12/26/2013); and History of GI diverticular bleed (710/15). and presents today for an acute visit.   Review of Systems    Objective:    There were no vitals taken for this visit. Nursing note and vital signs reviewed.  Physical Exam     Assessment & Plan:   There are no diagnoses linked to this encounter.   I am having Mr. Presto maintain his Timolol Maleate, Vitamin D, multivitamin with minerals, ketoconazole, simethicone, mometasone, montelukast, finasteride, nystatin-triamcinolone, valsartan, and Ibuprofen.   No orders of the defined types were placed in this encounter.     Follow-up: No Follow-up on file. Continue to follow with Binnie Rail, MD for routine health maintenance.   Mable Paris, FNP

## 2016-03-05 NOTE — Progress Notes (Signed)
Subjective:    Patient ID: Jack Noble, male    DOB: 1929-08-30, 82 y.o.   MRN: [id number]  Chief Complaint  Patient presents with  . Abdominal Pain    Patient stated that pain below naval that started last night. Patient stated that his pain has subsided. Pain lasted through the night. Patient layed flat on back and seemed to help with the pain. Patient stated that he has had 2 BM's today and mentioned that was not usual.     HPI Comments: Notes prior instances of abdominal pain.  Has abdominal inoperable hernia for several years. Watched by prior PCP and advised not to have repaired as 'teflon may not hold such a large hernia'. CT [place] 6/16 showed small bilateral inguinal hernias containing fat. It also notes 2 midline supraumbilical ventral hernias, both containing fat.   Abdominal Pain This is a new problem. The current episode started today. The onset quality is sudden. The problem occurs constantly. The problem has been resolved. The pain is located in the suprapubic region. The pain is at a severity of 0/10. The patient is experiencing no pain. The quality of the pain is aching and sharp. The abdominal pain does not radiate. Associated symptoms include constipation. Pertinent negatives include no arthralgias, belching, diarrhea, fever, frequency, headaches, myalgias, nausea or vomiting. Nothing aggravates the pain. Relieved by: mylanta. Treatments tried: Mylanta, water, rest flat. The treatment provided moderate relief. His past medical history is significant for abdominal surgery. There is no history of colon cancer, gallstones, GERD or pancreatitis. gallbladder removed     Jack Noble is a 82 y.o. male who  has a past medical history of Dermatophytosis of nail; Hyperlipidemia; Hearing loss; Hyperplasia of prostate without urinary obstruction; Cataracts, bilateral; Rosacea; Allergy; Glaucoma; Corneal abrasion; Sepsis due to Escherichia coli (E. coli) ([place]) (05/2011);  Diverticulosis; TIA (transient ischemic attack) (9/6-05/2012); Epistaxis; Aphasia (12/26/2013); and History of GI diverticular bleed (710/15). and presents today for an acute visit.   Review of Systems  Constitutional: Negative for fever and chills.  HENT: Negative for congestion, ear pain, rhinorrhea, sinus pressure and sore throat.   Respiratory: Negative for cough, shortness of breath and wheezing.   Cardiovascular: Negative for chest pain and palpitations.  Gastrointestinal: Positive for abdominal pain and constipation. Negative for nausea, vomiting, diarrhea, blood in stool and abdominal distention.       Occasional constipation, now resolved. Takes Colace from time to time for this.  Genitourinary: Negative for frequency.  Musculoskeletal: Negative for myalgias and arthralgias.  Neurological: Negative for headaches.      Objective:    BP 120/80 mmHg  Pulse 62  Temp(Src) 97.4 F (36.3 C) (Oral)  Ht _0  (1.676 m)  Wt 143 lb (64.864 kg)  BMI 23.09 kg/m2  SpO2 94% Nursing note and vital signs reviewed.  Physical Exam  Constitutional: He appears well-developed and well-nourished.  HENT:  Nose: Nose normal.  Eyes: Conjunctivae are normal.  Cardiovascular: Regular rhythm and normal heart sounds.   Pulmonary/Chest: Effort normal and breath sounds normal. No respiratory distress. He has no wheezes. He has no rhonchi. He has no rales.  Abdominal: Soft. Normal appearance and bowel sounds are normal. He exhibits no distension, no ascites and no mass. There is no tenderness. There is no rigidity, no rebound, no guarding and no CVA tenderness.  Large supraumbilical ventral hernia. Nontender. Easily reducible. No evidence of strangulation or incarceration of bowel.  Lymphadenopathy:       Head (right  side): No submental, no submandibular, no tonsillar, no preauricular, no posterior auricular and no occipital adenopathy present.       Head (left side): No submental, no submandibular, no  tonsillar, no preauricular, no posterior auricular and no occipital adenopathy present.    He has no cervical adenopathy.  Neurological: He is alert.  Skin: Skin is warm and dry.  Psychiatric: He has a normal mood and affect. His speech is normal and behavior is normal.  Vitals reviewed.      Assessment & Plan:   1. Abdominal pain, unspecified abdominal location Working diagnosis abdominal pain is nonspecific at this time.Hernia has been chronic and stable and no evidence on exam stimulation or incarceration of bowel. Suprapubic Pain has resolved and responded to Mylanta, water, and rest last night. I suspect upset stomach or increase in flatus as etiology.    Pending  - CBC with Differential/Platelet - Comp Met (CMET); Future -UA   I am having Mr. Cacciola maintain his Timolol Maleate, Vitamin D, multivitamin with minerals, ketoconazole, simethicone, mometasone, montelukast, finasteride, nystatin-triamcinolone, valsartan, Ibuprofen, and aspirin EC.   Start medications as prescribed and explained to patient on After Visit Summary ( AVS). Risks, benefits, and alternatives of the medications and treatment plan prescribed today were discussed, and patient expressed understanding.   Education regarding symptom management and diagnosis given to patient.   Follow-up:Plan follow-up as discussed or as needed if any worsening symptoms or change in condition. No Follow-up on file. Continue to follow with Binnie Rail, MD for routine health maintenance.    Leola Brazil and I agreed with plan.   Mable Paris, FNP  Portions of this note may have been constructed with voice recognition software.

## 2016-03-05 NOTE — Patient Instructions (Signed)
Labs on way out.  Continue to monitor abdominal pain, and if recurs, let us know. Appears to be case of upset stomach with Mylanta as needed.   If there is no improvement in your symptoms, or if there is any worsening of symptoms, or if you have any additional concerns, please return for re-evaluation; or, if we are closed, consider going to the Emergency Room for evaluation if symptoms urgent.

## 2016-03-05 NOTE — Progress Notes (Deleted)
   Subjective:    Patient ID: Jack Noble, male    DOB: [birth year], 82 y.o.   MRN: [id number]  No chief complaint on file.   HPI:  Jack Noble is a 82 y.o. male who  has a past medical history of Dermatophytosis of nail; Hyperlipidemia; Hearing loss; Hyperplasia of prostate without urinary obstruction; Cataracts, bilateral; Rosacea; Allergy; Glaucoma; Corneal abrasion; Sepsis due to Escherichia coli (E. coli) ([place]) (05/2011); Diverticulosis; TIA (transient ischemic attack) (9/6-05/2012); Epistaxis; Aphasia (12/26/2013); and History of GI diverticular bleed (710/15). and presents today for an acute visit.   Review of Systems    Objective:    There were no vitals taken for this visit. Nursing note and vital signs reviewed.  Physical Exam     Assessment & Plan:   There are no diagnoses linked to this encounter.   I am having Mr. Declark maintain his Timolol Maleate, Vitamin D, multivitamin with minerals, ketoconazole, simethicone, mometasone, montelukast, finasteride, nystatin-triamcinolone, valsartan, and Ibuprofen.   No orders of the defined types were placed in this encounter.     Follow-up: No Follow-up on file.  Mable Paris, FNP

## 2016-03-09 ENCOUNTER — Encounter: Payer: MEDICARE | Admitting: Physical Therapy

## 2016-03-10 ENCOUNTER — Other Ambulatory Visit (INDEPENDENT_AMBULATORY_CARE_PROVIDER_SITE_OTHER): Payer: MEDICARE

## 2016-03-10 DIAGNOSIS — D649 Anemia, unspecified: Secondary | ICD-10-CM

## 2016-03-10 LAB — CBC WITH DIFFERENTIAL/PLATELET
Basophils Absolute: 0 10*3/uL (ref 0.0–0.1)
Basophils Relative: 0.6 % (ref 0.0–3.0)
EOS PCT: 1.2 % (ref 0.0–5.0)
Eosinophils Absolute: 0.1 10*3/uL (ref 0.0–0.7)
HCT: 39.1 % (ref 39.0–52.0)
Hemoglobin: 13.4 g/dL (ref 13.0–17.0)
LYMPHS ABS: 1.6 10*3/uL (ref 0.7–4.0)
Lymphocytes Relative: 29.2 % (ref 12.0–46.0)
MCHC: 34.2 g/dL (ref 30.0–36.0)
MCV: 96.6 fl (ref 78.0–100.0)
MONOS PCT: 8.7 % (ref 3.0–12.0)
Monocytes Absolute: 0.5 10*3/uL (ref 0.1–1.0)
NEUTROS ABS: 3.2 10*3/uL (ref 1.4–7.7)
NEUTROS PCT: 60.3 % (ref 43.0–77.0)
PLATELETS: 201 10*3/uL (ref 150.0–400.0)
RBC: 4.05 Mil/uL — ABNORMAL LOW (ref 4.22–5.81)
RDW: 13.2 % (ref 11.5–15.5)
WBC: 5.3 10*3/uL (ref 4.0–10.5)

## 2016-03-11 ENCOUNTER — Encounter: Payer: MEDICARE | Admitting: Physical Therapy

## 2016-03-16 ENCOUNTER — Encounter: Payer: MEDICARE | Admitting: Physical Therapy

## 2016-03-18 ENCOUNTER — Ambulatory Visit: Payer: Self-pay | Admitting: Physical Therapy

## 2016-04-01 ENCOUNTER — Ambulatory Visit: Payer: MEDICARE | Admitting: Family Medicine

## 2016-04-02 ENCOUNTER — Ambulatory Visit: Payer: MEDICARE | Admitting: Neurology

## 2016-04-02 ENCOUNTER — Telehealth: Payer: Self-pay | Admitting: Internal Medicine

## 2016-04-02 NOTE — Telephone Encounter (Signed)
Jack Noble spoke to spouse. Spouse stated understanding.

## 2016-04-02 NOTE — Telephone Encounter (Signed)
[facility] - Client [place] [facility]  Patient Name: Jack Noble  DOB: October 03, 1929    Initial Comment Caller states her husband has been in pain all morning. In his groin area he has knot which is moving and causing pain.    Nurse Assessment  Nurse: Christel Mormon, RN, Levada Dy Date/Time Eilene Ghazi Time): 04/02/2016 4:06:26 PM  Confirm and document reason for call. If symptomatic, describe symptoms. You must click the next button to save text entered. ---Caller states after lunch, husband started having pain above and to the right of the penis. The area is puffed up on that side. Urinating ok. Wife states the bulge can not be reduced and wife states she can feel the knot. The knot is not painful.  Has the patient traveled out of the country within the last 30 days? ---Not Applicable  Does the patient have any new or worsening symptoms? ---Yes  Will a triage be completed? ---Yes  Related visit to physician within the last 2 weeks? ---No  Does the PT have any chronic conditions? (i.e. diabetes, asthma, etc.) ---Yes  List chronic conditions. ---abdominal hernia, HTN, "prostate problems"  Is this a behavioral health or substance abuse call? ---No     Guidelines    Guideline Title Affirmed Question Affirmed Notes  Hernia Can't reduce the hernia (NO pain, local tenderness, or vomiting)    Final Disposition User   See Physician within [place], RN, [place]  spoke with office- no appts until Monday with any doctor at the office. Wife states she will try calling the office tomorrow and if hurts again tonight, she will take him in somewhere.   Referrals  REFERRED TO PCP OFFICE   Disagree/Comply: Comply

## 2016-04-06 ENCOUNTER — Encounter: Payer: Self-pay | Admitting: Internal Medicine

## 2016-04-06 ENCOUNTER — Telehealth: Payer: Self-pay | Admitting: Neurology

## 2016-04-06 ENCOUNTER — Ambulatory Visit (INDEPENDENT_AMBULATORY_CARE_PROVIDER_SITE_OTHER): Payer: MEDICARE | Admitting: Internal Medicine

## 2016-04-06 VITALS — BP 136/82 | HR 68 | Temp 97.8°F | Resp 16 | Wt 141.0 lb

## 2016-04-06 DIAGNOSIS — K4021 Bilateral inguinal hernia, without obstruction or gangrene, recurrent: Secondary | ICD-10-CM | POA: Diagnosis not present

## 2016-04-06 DIAGNOSIS — K4020 Bilateral inguinal hernia, without obstruction or gangrene, not specified as recurrent: Secondary | ICD-10-CM | POA: Insufficient documentation

## 2016-04-06 NOTE — Assessment & Plan Note (Signed)
Right inguinal hernia has been symptomatic, but is currently asymptomatic - will just monitor for now Refer to surgery only if it becomes more symptomatic  - advised trying to avoid surgery if possible.

## 2016-04-06 NOTE — Progress Notes (Signed)
Subjective:    Patient ID: Jack Noble, male    DOB: 16-Aug-1929, 82 y.o.   MRN: [id number]  HPI He is here for an acute visit for stomach pain.  Stomach pain: Tuesday night they went out to eat and Wednesday they went out as well.  Thursday he had a stomach ache all day.  It would go away when he laid down. Saturday he was in bed all day long.  He had a bulge in the right groin that went away after about on hour or so.  He has not had any pain since.  He denies changes in his bowel movements - no constipation or diarrhea.  He denies nausea.  He denies fever and he denies changes in his appetite.    CT scan abdomen 05/13/2015:  Other: Small bilateral inguinal hernias contain fat. There are 2 midline supraumbilical ventral hernias, both containing fat. The superior hernia measures 4.4 x 8.8 cm with a 3.3 cm neck. The inferior supraumbilical hernia measures 5.0 x 9.6 cm with a 3.3 cm neck. Mesenteries and peritoneum are otherwise unremarkable. No free fluid.  He denies any upper abdominal pain from his ventral hernia.  He has not had any left inguinal hernia.    Medications and allergies reviewed with patient and updated if appropriate.  Patient Active Problem List   Diagnosis Date Noted  . External hemorrhoid 02/25/2016  . Confusion 02/25/2016  . Poor balance 02/05/2016  . Right knee pain 02/05/2016  . Leg cramps 02/04/2016  . Essential hypertension, benign 12/26/2015  . Fatigue 09/19/2015  . Loss of weight 08/14/2015  . GERD (gastroesophageal reflux disease) 06/06/2015  . Skin cancer 09/14/2014  . History of GI diverticular bleed 06/08/2014  . BRBPR (bright red blood per rectum) 06/01/2014  . Aphasia 12/26/2013  . History of short term memory loss 09/01/2013  . Anemia, unspecified 08/04/2013  . Hypoalbuminemia 08/04/2013  . Severe sepsis ([place]) 07/24/2013  . BPH (benign prostatic hyperplasia) 07/24/2013  . Other and unspecified hyperlipidemia 05/18/2013  . Glaucoma    . Incisional hernia, with obstruction, without gangrene 08/25/2010  . DIVERTICULOSIS, COLON 07/17/2009  . COLONIC POLYPS, HX OF 07/17/2009  . ALLERGIC RHINITIS 10/16/2008  . Other abnormal glucose 10/16/2008  . LIPOMAS, MULTIPLE 11/04/2007  . Rosacea 05/12/2007    Current Outpatient Prescriptions on File Prior to Visit  Medication Sig Dispense Refill  . aspirin EC 81 MG tablet Take 162 mg by mouth.    . Cholecalciferol (VITAMIN D) 1000 UNITS capsule Take 1,000 Units by mouth every morning.     . finasteride (PROSCAR) 5 MG tablet Take 1 tablet (5 mg total) by mouth daily. 30 tablet 5  . Ibuprofen 200 MG CAPS Take 2 capsules (400 mg total) by mouth 3 (three) times daily with meals as needed. 1 each 0  . ketoconazole (NIZORAL) 2 % cream Apply 1 application topically 2 (two) times daily as needed for irritation. 30 g 0  . mometasone (ELOCON) 0.1 % ointment APPLY TOPICALLY TO AFFECTED AREA(S) TWICE DAILY 45 g 0  . montelukast (SINGULAIR) 10 MG tablet Take 1 tablet (10 mg total) by mouth at bedtime. 30 tablet 1  . Multiple Vitamin (MULTIVITAMIN WITH MINERALS) TABS Take 1 tablet by mouth every morning.     . nystatin-triamcinolone (MYCOLOG II) cream Apply 1 application topically daily as needed (Applies to red places in groin area.). --- Please establish with new PCP for further refills 30 g 0  . simethicone (MYLICON) 80  MG chewable tablet Chew 1 tablet (80 mg total) by mouth every 6 (six) hours as needed for flatulence. 30 tablet 0  . Timolol Maleate (ISTALOL) 0.5 % (DAILY) SOLN Place 1 drop into the left eye 2 (two) times daily at 8 am and 10 pm.     . valsartan (DIOVAN) 40 MG tablet Take 1 tablet (40 mg total) by mouth daily. 30 tablet 5   No current facility-administered medications on file prior to visit.    Past Medical History  Diagnosis Date  . Dermatophytosis of nail   . Hyperlipidemia   . Hearing loss   . Hyperplasia of prostate without urinary obstruction   . Cataracts,  bilateral   . Rosacea     Dr. Marge Duncans  . Allergy   . Glaucoma     Dr. Edilia Bo  . Corneal abrasion     Dr. Ned Clines, [facility]  . Sepsis due to Escherichia coli (E. coli) ([place]) 05/2011    Morganella also cultured  . Diverticulosis   . TIA (transient ischemic attack) 9/6-05/2012    BP 221/99  . Epistaxis   . Aphasia 12/26/2013    Dr Jannifer Franklin  . History of GI diverticular bleed 710/15    Past Surgical History  Procedure Laterality Date  . Tonsillectomy and adenoidectomy    . Appendectomy      age 69  . Cataract extraction, bilateral  2003    Dr Ishmael Holter (now seen @ [place])  . Inguinal hernia repair      age 79  . Colonoscopy w/ polypectomy  06/2009    Dr.Jacobs; "miniscule polyp"  . Cholecystectomy  1978    stones  . Upper gi endoscopy  05/2014    negative  . Skin biopsy  12/2014    done by dermatologist     Social History   Social History  . Marital Status: Married    Spouse Name: N/A  . Number of Children: 1  . Years of Education: college   Occupational History  . [employer]    Social History Main Topics  . Smoking status: Never Smoker   . Smokeless tobacco: Never Used  . Alcohol Use: No  . Drug Use: No  . Sexual Activity: Not on file   Other Topics Concern  . Not on file   Social History Narrative   Lives with his wife.      Family History  Problem Relation Age of Onset  . Stroke Mother 42  . Parkinsonism Brother     Brother died at age at age 29  . Pulmonary embolism Father 6    Post Op  . Diabetes Neg Hx   . Cancer Neg Hx   . COPD Neg Hx   . Heart disease Neg Hx     Review of Systems  Constitutional: Negative for fever, chills and appetite change.  Gastrointestinal: Positive for abdominal pain. Negative for nausea, diarrhea and constipation.       Objective:   Filed Vitals:   04/06/16 1423  BP: 136/82  Pulse: 68  Temp: 97.8 F (36.6 C)  Resp: 16   Filed Weights   04/06/16 1423  Weight: 141 lb (63.957 kg)   Body mass  index is 22.77 kg/(m^2).   Physical Exam  Constitutional: He appears well-developed and well-nourished. No distress.  Abdominal: Soft. There is no tenderness. There is no rebound and no guarding.  Large ventral hernia the size of a large grapefruit - non-tender to palpation; right inguinal  hernia that is non-tender.  No palpable left inguinal hernia  Skin: He is not diaphoretic.          Assessment & Plan:   See Problem List for Assessment and Plan of chronic medical problems.

## 2016-04-06 NOTE — Progress Notes (Signed)
Pre visit review using our clinic review tool, if applicable. No additional management support is needed unless otherwise documented below in the visit note. 

## 2016-04-06 NOTE — Telephone Encounter (Signed)
Evaluation by speech therapy suggests progressive problems with aphasia, the possibility of primary progressive aphasia is therefore viable.

## 2016-04-06 NOTE — Therapy (Signed)
[facility] [address] [place], [place], [address] Phone: [phone]   Fax:  [phone]  Patient Details  Name: Jack Noble MRN: [id number] Date of Birth: 1929-06-22 Referring Provider:  No ref. provider found  Encounter Date: 04/06/2016  SPEECH THERAPY DISCHARGE SUMMARY  Visits from Start of Care: 2  Current functional level related to goals / functional outcomes: Pt's goals were not met. Pt/wife phoned to inform SLP that pt no longer desired therapy, after first therapy visit.   Remaining deficits: Assumed all deficits remain, as pt did not complete therapy course.   Education / Equipment: Primary progressive aphasia, home tasks, home compensations for anomia/apraxia.  Plan: Patient agrees to discharge.  Patient goals were not met. Patient is being discharged due to not returning since the last visit.  ?????        [place] ,MS, CCC-SLP  04/06/2016, 10:59 AM  [facility] [address] [place], [place], [address] Phone: [phone]   Fax:  [phone]

## 2016-04-06 NOTE — Patient Instructions (Signed)
You have an inguinal hernia.   Since you are not having symptoms now you do not have to have this treated at this time.  If you become more symptomatic please let me know and we will get you to see a surgeon.     Inguinal Hernia, Adult Muscles help keep everything in the body in its proper place. But if a weak spot in the muscles develops, something can poke through. That is called a hernia. When this happens in the lower part of the belly (abdomen), it is called an inguinal hernia. (It takes its name from a part of the body in this region called the inguinal canal.) A weak spot in the wall of muscles lets some fat or part of the small intestine bulge through. An inguinal hernia can develop at any age. Men get them more often than women. CAUSES  In adults, an inguinal hernia develops over time.  It can be triggered by:  Suddenly straining the muscles of the lower abdomen.  Lifting heavy objects.  Straining to have a bowel movement. Difficult bowel movements (constipation) can lead to this.  Constant coughing. This may be caused by smoking or lung disease.  Being overweight.  Being pregnant.  Working at a job that requires long periods of standing or heavy lifting.  Having had an inguinal hernia before. One type can be an emergency situation. It is called a strangulated inguinal hernia. It develops if part of the small intestine slips through the weak spot and cannot get back into the abdomen. The blood supply can be cut off. If that happens, part of the intestine may die. This situation requires emergency surgery. SYMPTOMS  Often, a small inguinal hernia has no symptoms. It is found when a healthcare provider does a physical exam. Larger hernias usually have symptoms.   In adults, symptoms may include:  A lump in the groin. This is easier to see when the person is standing. It might disappear when lying down.  In men, a lump in the scrotum.  Pain or burning in the groin. This  occurs especially when lifting, straining or coughing.  A dull ache or feeling of pressure in the groin.  Signs of a strangulated hernia can include:  A bulge in the groin that becomes very painful and tender to the touch.  A bulge that turns red or purple.  Fever, nausea and vomiting.  Inability to have a bowel movement or to pass gas. DIAGNOSIS  To decide if you have an inguinal hernia, a healthcare provider will probably do a physical examination.  This will include asking questions about any symptoms you have noticed.  The healthcare provider might feel the groin area and ask you to cough. If an inguinal hernia is felt, the healthcare provider may try to slide it back into the abdomen.  Usually no other tests are needed. TREATMENT  Treatments can vary. The size of the hernia makes a difference. Options include:  Watchful waiting. This is often suggested if the hernia is small and you have had no symptoms.  No medical procedure will be done unless symptoms develop.  You will need to watch closely for symptoms. If any occur, contact your healthcare provider right away.  Surgery. This is used if the hernia is larger or you have symptoms.  Open surgery. This is usually an outpatient procedure (you will not stay overnight in a hospital). An cut (incision) is made through the skin in the groin. The hernia is put  back inside the abdomen. The weak area in the muscles is then repaired by herniorrhaphy or hernioplasty. Herniorrhaphy: in this type of surgery, the weak muscles are sewn back together. Hernioplasty: a patch or mesh is used to close the weak area in the abdominal wall.  Laparoscopy. In this procedure, a surgeon makes small incisions. A thin tube with a tiny video camera (called a laparoscope) is put into the abdomen. The surgeon repairs the hernia with mesh by looking with the video camera and using two long instruments. HOME CARE INSTRUCTIONS   After surgery to repair an  inguinal hernia:  You will need to take pain medicine prescribed by your healthcare provider. Follow all directions carefully.  You will need to take care of the wound from the incision.  Your activity will be restricted for awhile. This will probably include no heavy lifting for several weeks. You also should not do anything too active for a few weeks. When you can return to work will depend on the type of job that you have.  During "watchful waiting" periods, you should:  Maintain a healthy weight.  Eat a diet high in fiber (fruits, vegetables and whole grains).  Drink plenty of fluids to avoid constipation. This means drinking enough water and other liquids to keep your urine clear or pale yellow.  Do not lift heavy objects.  Do not stand for long periods of time.  Quit smoking. This should keep you from developing a frequent cough. SEEK MEDICAL CARE IF:   A bulge develops in your groin area.  You feel pain, a burning sensation or pressure in the groin. This might be worse if you are lifting or straining.  You develop a fever of more than 100.5 F (38.1 C). SEEK IMMEDIATE MEDICAL CARE IF:   Pain in the groin increases suddenly.  A bulge in the groin gets bigger suddenly and does not go down.  For men, there is sudden pain in the scrotum. Or, the size of the scrotum increases.  A bulge in the groin area becomes red or purple and is painful to touch.  You have nausea or vomiting that does not go away.  You feel your heart beating much faster than normal.  You cannot have a bowel movement or pass gas.  You develop a fever of more than 102.0 F (38.9 C).   This information is not intended to replace advice given to you by your health care provider. Make sure you discuss any questions you have with your health care provider.   Document Released: 03/28/2009 Document Revised: 02/01/2012 Document Reviewed: 05/13/2015 Elsevier Interactive Patient Education [employer].

## 2016-04-07 ENCOUNTER — Ambulatory Visit (INDEPENDENT_AMBULATORY_CARE_PROVIDER_SITE_OTHER): Payer: MEDICARE | Admitting: Family Medicine

## 2016-04-07 ENCOUNTER — Encounter: Payer: Self-pay | Admitting: Family Medicine

## 2016-04-07 VITALS — BP 130/72 | HR 67 | Ht 66.0 in | Wt 141.0 lb

## 2016-04-07 DIAGNOSIS — M25561 Pain in right knee: Secondary | ICD-10-CM

## 2016-04-07 NOTE — Progress Notes (Signed)
[facility] [place], [place] [address] Phone: [phone] Subjective:    I'm seeing this patient by the request  Of: Binnie Rail, MD      CC: Right leg follow-up   CHE:NIDPOEUMPN Jack Noble is a 82 y.o. male coming in with complaint of patient does have some difficulty recently. Patient was seen previously and did have some mild bony abnormality of the knee with very mild arthritis. Patient did have an injection and started formal physical therapy. Patient states that he is feeling 100% better. No pain at this time. Feeling much better. Doing the exercises regularly.  Patient was also sent for knee x-rays recently. Knee x-rays show no significant bony abnormality.      Past Medical History  Diagnosis Date  . Dermatophytosis of nail   . Hyperlipidemia   . Hearing loss   . Hyperplasia of prostate without urinary obstruction   . Cataracts, bilateral   . Rosacea     Dr. Marge Duncans  . Allergy   . Glaucoma     Dr. Edilia Bo  . Corneal abrasion     Dr. Ned Clines, [facility] Ophth  . Sepsis due to Escherichia coli (E. coli) ([place]) 05/2011    Morganella also cultured  . Diverticulosis   . TIA (transient ischemic attack) 9/6-05/2012    BP 221/99  . Epistaxis   . Aphasia 12/26/2013    Dr Jannifer Franklin  . History of GI diverticular bleed 710/15   Past Surgical History  Procedure Laterality Date  . Tonsillectomy and adenoidectomy    . Appendectomy      age 55  . Cataract extraction, bilateral  2003    Dr Ishmael Holter (now seen @ [facility])  . Inguinal hernia repair      age 14  . Colonoscopy w/ polypectomy  06/2009    Dr.Jacobs; "miniscule polyp"  . Cholecystectomy  1978    stones  . Upper gi endoscopy  05/2014    negative  . Skin biopsy  12/2014    done by dermatologist    Social History   Social History  . Marital Status: Married    Spouse Name: N/A  . Number of Children: 1  . Years of Education: college   Occupational History  .  [employer]    Social History Main Topics  . Smoking status: Never Smoker   . Smokeless tobacco: Never Used  . Alcohol Use: No  . Drug Use: No  . Sexual Activity: Not Asked   Other Topics Concern  . None   Social History Narrative   Lives with his wife.     Allergies  Allergen Reactions  . Amoxicillin     urticaria  . Codeine     Rash Because of a history of documented adverse serious drug reaction;Medi Alert bracelet  is recommended  . Minocycline Hcl     REACTION: nervous/chest pressure (Note: may have affected GE valve)  . Astelin [Azelastine Hcl]     Makes me jittery  . Azelastine Other (See Comments)  . Ether     pneumonia  . Zocor [Simvastatin] Other (See Comments)    Itching w/o, muscle problems and it affected his stomach  . Metoprolol     lightheaded   Family History  Problem Relation Age of Onset  . Stroke Mother 66  . Parkinsonism Brother     Brother died at age at age 55  . Pulmonary embolism Father 44  Post Op  . Diabetes Neg Hx   . Cancer Neg Hx   . COPD Neg Hx   . Heart disease Neg Hx     Past medical history, social, surgical and family history all reviewed in electronic medical record.  No pertanent information unless stated regarding to the chief complaint.   Review of Systems: No headache, visual changes, nausea, vomiting, diarrhea, constipation, dizziness, abdominal pain, skin rash, fevers, chills, night sweats, weight loss, swollen lymph nodes, body aches, joint swelling, muscle aches, chest pain, shortness of breath, mood changes.   Objective Blood pressure 130/72, pulse 67, height '5\' 6"'  (1.676 m), weight 141 lb (63.957 kg), SpO2 96 %.  General: Patient is alert but not oriented significant aphasia.  HEENT: Pupils equal, extraocular movements intact  Respiratory: Patient's speak in full sentences and does not appear short of breath  Cardiovascular: No lower extremity edema, non tender, no erythema  Skin: Warm dry intact with no  signs of infection or rash on extremities or on axial skeleton.  Abdomen: Soft nontender  Neuro: Cranial nerves II through XII are intact, neurovascularly intact in all extremities with 2+ DTRs and 2+ pulses.  Lymph: No lymphadenopathy of posterior or anterior cervical chain or axillae bilaterally.  Gait significant difficulty with balance and coordination.  MSK:  Non tender with full range of motion and good stability and tone of shoulders, elbows, wrist, hip, and ankles bilaterally. Arthritic changes of multiple joints patient does have weakness of 4 out of 5 strength of the legs bilaterally. Muscle wasting noted. Knee: Right Improve muscle strength nontender today ROM full in flexion and extension and lower leg rotation. Ligaments with solid consistent endpoints including ACL, PCL, LCL, MCL. Negative Mcmurray's, Apley's, and Thessalonian tests. nonpainful patellar compression. Patellar glide without crepitus. Patellar and quadriceps tendons unremarkable. Hamstring and quadriceps strength is normal.  Contralateral knee unremarkable Significant improvement.    Impression and Recommendations:     This case required medical decision making of moderate complexity.      Note: This dictation was prepared with Dragon dictation along with smaller phrase technology. Any transcriptional errors that result from this process are unintentional.

## 2016-04-07 NOTE — Assessment & Plan Note (Signed)
Doing much better at this time. No significant changes. Encourage patient to continue exercise 2 times a week. Patient will follow-up with me on an as-needed basis. Can inject as needed every 3 months.

## 2016-04-07 NOTE — Progress Notes (Signed)
Pre visit review using our clinic review tool, if applicable. No additional management support is needed unless otherwise documented below in the visit note. 

## 2016-04-07 NOTE — Patient Instructions (Signed)
Good to see you  Continue the exercises 2 times a week  Ice when you need it See me again when you need me.

## 2016-04-13 DIAGNOSIS — D239 Other benign neoplasm of skin, unspecified: Secondary | ICD-10-CM | POA: Diagnosis not present

## 2016-04-13 DIAGNOSIS — L570 Actinic keratosis: Secondary | ICD-10-CM | POA: Diagnosis not present

## 2016-04-13 DIAGNOSIS — L309 Dermatitis, unspecified: Secondary | ICD-10-CM | POA: Diagnosis not present

## 2016-05-18 ENCOUNTER — Telehealth: Payer: Self-pay | Admitting: Internal Medicine

## 2016-05-18 DIAGNOSIS — H18453 Nodular corneal degeneration, bilateral: Secondary | ICD-10-CM | POA: Diagnosis not present

## 2016-05-18 DIAGNOSIS — Z961 Presence of intraocular lens: Secondary | ICD-10-CM | POA: Diagnosis not present

## 2016-05-18 DIAGNOSIS — H1859 Other hereditary corneal dystrophies: Secondary | ICD-10-CM | POA: Diagnosis not present

## 2016-05-18 NOTE — Telephone Encounter (Signed)
Is patient going to ED?  He needs to be evaluated today.

## 2016-05-18 NOTE — Telephone Encounter (Signed)
Please advise 

## 2016-05-18 NOTE — Telephone Encounter (Signed)
Spoke with pts wife. Appt scheduled with Marya Amsler on 05/21/16

## 2016-05-18 NOTE — Telephone Encounter (Signed)
Patient Name: KENAI HAUPTMAN  DOB: Dec 10, 1928    Initial Comment Caller states husband having weak spells, bp has been up 175/76   Nurse Assessment  Nurse: Leilani Merl, RN, Heather Date/Time (Eastern Time): 05/18/2016 9:25:04 AM  Confirm and document reason for call. If symptomatic, describe symptoms. You must click the next button to save text entered. ---Caller states husband having weak spells, BP has been up 175/76 last night  Has the patient traveled out of the country within the last 30 days? ---Not Applicable  Does the patient have any new or worsening symptoms? ---Yes  Will a triage be completed? ---Yes  Related visit to physician within the last 2 weeks? ---No  Does the PT have any chronic conditions? (i.e. diabetes, asthma, etc.) ---Yes  List chronic conditions. ---See MR  Is this a behavioral health or substance abuse call? ---No     Guidelines    Guideline Title Affirmed Question Affirmed Notes  Weakness (Generalized) and Fatigue [1] MODERATE weakness (i.e., interferes with work, school, normal activities) AND [2] cause unknown (Exceptions: weakness with acute minor illness, or weakness from poor fluid intake)    Final Disposition User   See Physician within 4 Hours (or PCP triage) Leilani Merl, RN, Heather    Referrals  REFERRED TO PCP OFFICE  [facility] - ED   Disagree/Comply: Comply

## 2016-05-21 ENCOUNTER — Ambulatory Visit (INDEPENDENT_AMBULATORY_CARE_PROVIDER_SITE_OTHER): Payer: MEDICARE | Admitting: Family

## 2016-05-21 ENCOUNTER — Encounter: Payer: Self-pay | Admitting: Family

## 2016-05-21 VITALS — BP 138/72 | HR 67 | Temp 97.9°F | Resp 16 | Ht 66.0 in | Wt 139.0 lb

## 2016-05-21 DIAGNOSIS — R531 Weakness: Secondary | ICD-10-CM | POA: Diagnosis not present

## 2016-05-21 NOTE — Progress Notes (Signed)
Subjective:    Patient ID: Jack Noble, male    DOB: [birth year], 82 y.o.   MRN: [id number]  Chief Complaint  Patient presents with  . Hypertension    went through a period of high blood pressure and weakness    HPI:  Jack Noble is a 82 y.o. male who  has a past medical history of Dermatophytosis of nail; Hyperlipidemia; Hearing loss; Hyperplasia of prostate without urinary obstruction; Cataracts, bilateral; Rosacea; Allergy; Glaucoma; Corneal abrasion; Sepsis due to Escherichia coli (E. coli) ([place]) (05/2011); Diverticulosis; TIA (transient ischemic attack) (9/6-05/2012); Epistaxis; Aphasia (12/26/2013); and History of GI diverticular bleed (710/15). and presents today for an office visit. Patient is present with his wife who provides some of the history secondary to his aphasia.   Associated symptom of "weak spells" have been going on for several months. Describes the feeling of overall weakness with a severity that causes him to sit down for periods of time. Denies fevers, chills, shortness of breath or chest pain. Reports that he is eating okay and that his appetite is good.  Does note that he was given Amoxicillin by his dentist recently for infection with a known allergy to amoxicillin. Symptoms worsened when taking the amoxicillin.    Allergies  Allergen Reactions  . Amoxicillin     urticaria  . Codeine     Rash Because of a history of documented adverse serious drug reaction;Medi Alert bracelet  is recommended  . Minocycline Hcl     REACTION: nervous/chest pressure (Note: may have affected GE valve)  . Astelin [Azelastine Hcl]     Makes me jittery  . Azelastine Other (See Comments)  . Ether     pneumonia  . Zocor [Simvastatin] Other (See Comments)    Itching w/o, muscle problems and it affected his stomach  . Metoprolol     lightheaded     Current Outpatient Prescriptions on File Prior to Visit  Medication Sig Dispense Refill  . aspirin EC 81 MG tablet Take  162 mg by mouth.    . Cholecalciferol (VITAMIN D) 1000 UNITS capsule Take 1,000 Units by mouth every morning.     . finasteride (PROSCAR) 5 MG tablet Take 1 tablet (5 mg total) by mouth daily. 30 tablet 5  . Ibuprofen 200 MG CAPS Take 2 capsules (400 mg total) by mouth 3 (three) times daily with meals as needed. 1 each 0  . ketoconazole (NIZORAL) 2 % cream Apply 1 application topically 2 (two) times daily as needed for irritation. 30 g 0  . mometasone (ELOCON) 0.1 % ointment APPLY TOPICALLY TO AFFECTED AREA(S) TWICE DAILY 45 g 0  . montelukast (SINGULAIR) 10 MG tablet Take 1 tablet (10 mg total) by mouth at bedtime. 30 tablet 1  . Multiple Vitamin (MULTIVITAMIN WITH MINERALS) TABS Take 1 tablet by mouth every morning.     . nystatin-triamcinolone (MYCOLOG II) cream Apply 1 application topically daily as needed (Applies to red places in groin area.). --- Please establish with new PCP for further refills 30 g 0  . simethicone (MYLICON) 80 MG chewable tablet Chew 1 tablet (80 mg total) by mouth every 6 (six) hours as needed for flatulence. 30 tablet 0  . Timolol Maleate (ISTALOL) 0.5 % (DAILY) SOLN Place 1 drop into the left eye 2 (two) times daily at 8 am and 10 pm.     . valsartan (DIOVAN) 40 MG tablet Take 1 tablet (40 mg total) by mouth daily. 30 tablet 5  No current facility-administered medications on file prior to visit.     Review of Systems  Constitutional: Negative for fever and chills.  Respiratory: Negative for chest tightness and shortness of breath.   Gastrointestinal: Negative for nausea, vomiting, abdominal pain, diarrhea and constipation.  Neurological: Positive for weakness. Negative for dizziness and light-headedness.      Objective:    BP 138/72 mmHg  Pulse 67  Temp(Src) 97.9 F (36.6 C) (Oral)  Resp 16  Ht 5\' 6"  (1.676 m)  Wt 139 lb (63.05 kg)  BMI 22.45 kg/m2  SpO2 95% Nursing note and vital signs reviewed.  Physical Exam  Constitutional: He is oriented to  person, place, and time. He appears well-developed and well-nourished. No distress.  Cardiovascular: Normal rate, regular rhythm, normal heart sounds and intact distal pulses.   Pulmonary/Chest: Effort normal and breath sounds normal.  Musculoskeletal:  Upper and lower extremities with normal range of motion and 5+ strength.  Neurological: He is alert and oriented to person, place, and time. He has normal strength and normal reflexes. No cranial nerve deficit or sensory deficit. He displays a negative Romberg sign. Coordination and gait normal.  Skin: Skin is warm and dry.  Psychiatric: He has a normal mood and affect. His behavior is normal. Judgment and thought content normal.       Assessment & Plan:   Problem List Items Addressed This Visit      Other   Weakness - Primary    Generalized weakness complaints with no significant cause or findings. Physical exam is normal with blood pressure below goal. Question possible medication reaction to amoxicillin which he is allergic to or perhaps even increased anxiety due to his co-morbid conditions. He is eating and drinking well with no evidence of confusion or signs of infection. Previous blood work was unremarkable. Recommend continue current medications and to follow up if symptoms return. Continue to monitor with return precautions provided.           I am having Mr. Buchmann maintain his Timolol Maleate, Vitamin D, multivitamin with minerals, ketoconazole, simethicone, mometasone, montelukast, finasteride, nystatin-triamcinolone, valsartan, Ibuprofen, and aspirin EC.   Follow-up: Return if symptoms worsen or fail to improve.  Mauricio Po, FNP

## 2016-05-21 NOTE — Assessment & Plan Note (Addendum)
Generalized weakness complaints with no significant cause or findings. Physical exam is normal with blood pressure below goal. Question possible medication reaction to amoxicillin which he is allergic to or perhaps even increased anxiety due to his co-morbid conditions. He is eating and drinking well with no evidence of confusion or signs of infection. Previous blood work was unremarkable. Recommend continue current medications and to follow up if symptoms return. Continue to monitor with return precautions provided.

## 2016-05-21 NOTE — Progress Notes (Signed)
Pre visit review using our clinic review tool, if applicable. No additional management support is needed unless otherwise documented below in the visit note. 

## 2016-05-21 NOTE — Patient Instructions (Signed)
Thank you for choosing [facility].  Summary/Instructions:  Continue to take your medications as prescribed.  This appears to be a possible medication related effect with no residuals.  If your symptoms worsen or fail to improve, please contact our office for further instruction, or in case of emergency go directly to the emergency room at the closest medical facility.

## 2016-06-05 ENCOUNTER — Encounter (HOSPITAL_COMMUNITY): Payer: Self-pay

## 2016-06-05 ENCOUNTER — Emergency Department (HOSPITAL_COMMUNITY)
Admission: EM | Admit: 2016-06-05 | Discharge: 2016-06-05 | Disposition: A | Discharge status: Home / Self Care | Payer: MEDICARE | Attending: Emergency Medicine | Admitting: Emergency Medicine

## 2016-06-05 DIAGNOSIS — Z79899 Other long term (current) drug therapy: Secondary | ICD-10-CM | POA: Insufficient documentation

## 2016-06-05 DIAGNOSIS — Z7982 Long term (current) use of aspirin: Secondary | ICD-10-CM | POA: Diagnosis not present

## 2016-06-05 DIAGNOSIS — Z8673 Personal history of transient ischemic attack (TIA), and cerebral infarction without residual deficits: Secondary | ICD-10-CM | POA: Diagnosis not present

## 2016-06-05 DIAGNOSIS — E785 Hyperlipidemia, unspecified: Secondary | ICD-10-CM | POA: Insufficient documentation

## 2016-06-05 DIAGNOSIS — R1010 Upper abdominal pain, unspecified: Secondary | ICD-10-CM | POA: Diagnosis not present

## 2016-06-05 LAB — URINALYSIS, ROUTINE W REFLEX MICROSCOPIC
Bilirubin Urine: NEGATIVE
Glucose, UA: NEGATIVE mg/dL
Hgb urine dipstick: NEGATIVE
Ketones, ur: NEGATIVE mg/dL
Leukocytes, UA: NEGATIVE
Nitrite: NEGATIVE
Protein, ur: NEGATIVE mg/dL
Specific Gravity, Urine: 1.007 (ref 1.005–1.030)
pH: 7 (ref 5.0–8.0)

## 2016-06-05 NOTE — Discharge Instructions (Signed)

## 2016-06-05 NOTE — ED Provider Notes (Signed)
CSN: [id number]     Arrival date & time 06/05/16  1846 History  By signing my name below, I, Eustaquio Maize, attest that this documentation has been prepared under the direction and in the presence of Virgel Manifold, MD. Electronically Signed: Eustaquio Maize, ED Scribe. 06/05/2016. 9:55 PM.   Chief Complaint  Patient presents with  . Abdominal Pain   The history is provided by the patient. No language interpreter was used.   HPI Comments: Jack Noble is a 82 y.o. male who presents to the Emergency Department complaining of sudden onset, constant, sharp, upper abdominal pain that began earlier today around 3 PM. Pt took Mylanta without relief. The pain lasted about 3 hours and has not returned. He has had similar symptoms in the past that have improved with Mylanta. Hx incisional RUQ hernia after cholecystectomy in 1972. Denies shortness of breath, nausea, vomiting, diaphoresis, urinary issues, or any other associated symptoms.   Past Medical History  Diagnosis Date  . Dermatophytosis of nail   . Hyperlipidemia   . Hearing loss   . Hyperplasia of prostate without urinary obstruction   . Cataracts, bilateral   . Rosacea     Dr. Marge Duncans  . Allergy   . Glaucoma     Dr. Edilia Bo  . Corneal abrasion     Dr. Ned Clines, [facility] Op[facility]  . Sepsis due to Escherichia coli (E. coli) ([place]) 05/2011    Morganella also cultured  . Diverticulosis   . TIA (transient ischemic attack) 9/6-05/2012    BP 221/99  . Epistaxis   . Aphasia 12/26/2013    Dr Jannifer Franklin  . History of GI diverticular bleed 710/15   Past Surgical History  Procedure Laterality Date  . Tonsillectomy and adenoidectomy    . Appendectomy      age 89  . Cataract extraction, bilateral  2003    Dr Ishmael Holter (now seen @ [place])  . Inguinal hernia repair      age 71  . Colonoscopy w/ polypectomy  06/2009    Dr.Jacobs; "miniscule polyp"  . Cholecystectomy  1978    stones  . Upper gi endoscopy  05/2014    negative  . Skin biopsy  12/2014     done by dermatologist    Family History  Problem Relation Age of Onset  . Stroke Mother 62  . Parkinsonism Brother     Brother died at age at age 50  . Pulmonary embolism Father 27    Post Op  . Diabetes Neg Hx   . Cancer Neg Hx   . COPD Neg Hx   . Heart disease Neg Hx    Social History  Substance Use Topics  . Smoking status: Never Smoker   . Smokeless tobacco: Never Used  . Alcohol Use: No    Review of Systems  Constitutional: Negative for diaphoresis.  Respiratory: Negative for shortness of breath.   Gastrointestinal: Positive for abdominal pain. Negative for nausea and vomiting.  Genitourinary: Negative for difficulty urinating.  All other systems reviewed and are negative.  Allergies  Amoxicillin; Codeine; Minocycline hcl; Astelin; Azelastine; Ether; Zocor; and Metoprolol  Home Medications   Prior to Admission medications   Medication Sig Start Date End Date Taking? Authorizing Provider  aspirin EC 81 MG tablet Take 81 mg by mouth daily.    Yes Historical Provider, MD  Cholecalciferol (VITAMIN D) 1000 UNITS capsule Take 1,000 Units by mouth every morning.    Yes Historical Provider, MD  finasteride (PROSCAR) 5  MG tablet Take 1 tablet (5 mg total) by mouth daily. 08/14/15  Yes Hendricks Limes, MD  Multiple Vitamin (MULTIVITAMIN WITH MINERALS) TABS Take 1 tablet by mouth every morning.    Yes Historical Provider, MD  Timolol Maleate (ISTALOL) 0.5 % (DAILY) SOLN Place 1 drop into the left eye 2 (two) times daily at 8 am and 10 pm.    Yes Historical Provider, MD  valsartan (DIOVAN) 40 MG tablet Take 1 tablet (40 mg total) by mouth daily. 12/26/15  Yes Binnie Rail, MD  Ibuprofen 200 MG CAPS Take 2 capsules (400 mg total) by mouth 3 (three) times daily with meals as needed. Patient taking differently: Take 400 mg by mouth 3 (three) times daily with meals as needed (pain).  02/03/16   Binnie Rail, MD  ketoconazole (NIZORAL) 2 % cream Apply 1 application topically 2 (two)  times daily as needed for irritation. 05/07/14   Hendricks Limes, MD  mometasone (ELOCON) 0.1 % ointment APPLY TOPICALLY TO AFFECTED AREA(S) TWICE DAILY Patient taking differently: APPLY TOPICALLY TO AFFECTED AREA(S) TWICE DAILY PRN FOR IRRITATION 06/26/14   Hendricks Limes, MD  montelukast (SINGULAIR) 10 MG tablet Take 1 tablet (10 mg total) by mouth at bedtime. Patient taking differently: Take 10 mg by mouth at bedtime as needed (allergies).  02/06/15   Hendricks Limes, MD  nystatin-triamcinolone [facility]) cream Apply 1 application topically daily as needed (Applies to red places in groin area.). --- Please establish with new PCP for further refills 11/22/15   Hendricks Limes, MD  simethicone [facility]) 80 MG chewable tablet Chew 1 tablet (80 mg total) by mouth every 6 (six) hours as needed for flatulence. 06/03/14   Kinnie Feil, MD   BP 186/79 mmHg  Pulse 62  Temp(Src) 97.9 F (36.6 C) (Oral)  Resp 16  Ht 5\' 6"  (1.676 m)  Wt 137 lb (62.143 kg)  BMI 22.12 kg/m2  SpO2 95%   Physical Exam  Constitutional: He is oriented to person, place, and time. He appears well-developed and well-nourished.  HENT:  Head: Normocephalic and atraumatic.  Eyes: EOM are normal.  Neck: Normal range of motion.  Cardiovascular: Normal rate, regular rhythm, normal heart sounds and intact distal pulses.   Pulmonary/Chest: Effort normal and breath sounds normal. No respiratory distress.  Abdominal: Soft. He exhibits no distension. There is no tenderness.  RUQ incisional hernia, easily reduces, non tender  Musculoskeletal: Normal range of motion.  Neurological: He is alert and oriented to person, place, and time.  Skin: Skin is warm and dry.  Psychiatric: He has a normal mood and affect. Judgment normal.  Nursing note and vitals reviewed.   ED Course  Procedures (including critical care time)  DIAGNOSTIC STUDIES: Oxygen Saturation is 95% on RA, adequate by my interpretation.    COORDINATION OF  CARE: 9:54 PM-Discussed treatment plan with pt at bedside and pt agreed to plan.   Labs Review Labs Reviewed  URINALYSIS, ROUTINE W REFLEX MICROSCOPIC (NOT AT [facility])    Imaging Review No results found. I have personally reviewed and evaluated these images and lab results as part of my medical decision-making.   EKG Interpretation None      MDM   Final diagnoses:  Pain of upper abdomen   87yM with abdominal pain which has since resolved. Abdominal exam is benign. He has an incisional hernia but spontaneously reduces. It has been determined that no acute conditions requiring further emergency intervention are present at  this time. The patient has been advised of the diagnosis and plan. I reviewed any labs and imaging including any potential incidental findings. We have discussed signs and symptoms that warrant return to the ED and they are listed in the discharge instructions.       Virgel Manifold, MD 06/18/16 1525

## 2016-06-05 NOTE — ED Notes (Addendum)
Pt started having abdominal pain at 3 pm.  Pt has umbilical hernia.  Tried to take mylanta.  No relief.  No n/v.  No fever.   Some burning with urination.  Pain is lower pelvic region.  No change in bm's

## 2016-06-08 ENCOUNTER — Encounter: Payer: Self-pay | Admitting: Internal Medicine

## 2016-06-08 ENCOUNTER — Ambulatory Visit (INDEPENDENT_AMBULATORY_CARE_PROVIDER_SITE_OTHER): Payer: MEDICARE | Admitting: Internal Medicine

## 2016-06-08 VITALS — BP 122/72 | HR 64 | Temp 97.8°F | Wt 143.0 lb

## 2016-06-08 DIAGNOSIS — I10 Essential (primary) hypertension: Secondary | ICD-10-CM | POA: Diagnosis not present

## 2016-06-08 DIAGNOSIS — K644 Residual hemorrhoidal skin tags: Secondary | ICD-10-CM | POA: Insufficient documentation

## 2016-06-08 DIAGNOSIS — R040 Epistaxis: Secondary | ICD-10-CM | POA: Diagnosis not present

## 2016-06-08 DIAGNOSIS — K047 Periapical abscess without sinus: Secondary | ICD-10-CM | POA: Insufficient documentation

## 2016-06-08 DIAGNOSIS — R1033 Periumbilical pain: Secondary | ICD-10-CM | POA: Diagnosis not present

## 2016-06-08 DIAGNOSIS — R109 Unspecified abdominal pain: Secondary | ICD-10-CM | POA: Insufficient documentation

## 2016-06-08 MED ORDER — HYDROCORTISONE 2.5 % RE CREA: 1.0000 "application " | TOPICAL_CREAM | Freq: Two times a day (BID) | RECTAL | Status: DC

## 2016-06-08 NOTE — Assessment & Plan Note (Signed)
Right nare Lasted one day, now resolved No further treatment or evaluation

## 2016-06-08 NOTE — Assessment & Plan Note (Addendum)
Currently with no pain, evaluated in the ED ? Hernia - Cause of his abdominal pain - has had a few episodes in the past - ct scan done last year was normal except hernia Advised to lay down if he has pain  If pain is severe he does need to have it evaluated Recently had constipation which may have cause the pain

## 2016-06-08 NOTE — Assessment & Plan Note (Signed)
BP well controlled Current regimen effective and well tolerated Continue current medications at current doses  

## 2016-06-08 NOTE — Assessment & Plan Note (Signed)
Has had two rounds of antibiotics Likely needs tooth extracted Ok to have extraction Should stop ASA for 5 days prior

## 2016-06-08 NOTE — Assessment & Plan Note (Signed)
Resolved at this time Avoid constipation - take a stool softener as needed Anusol-HC cream for flares Can use vaseline as needed Sitz baths

## 2016-06-08 NOTE — Progress Notes (Signed)
Subjective:    Patient ID: Jack Noble, male    DOB: 05-Jul-1929, 82 y.o.   MRN: [id number]  HPI He is here for evaluation for a dental clearing.    Abdominal pain:  He went to the ED 7/14 for abdominal pain.  The pain was sudden onset and was in his central abdomen.  It lasted three hours and went away.  He has not had any pain over the weekend.  He denies coughing or lifting anything that day. He has had some constipation.  He has had pain in the pain and we thought it may have been related to his hernia.   Hemorrhoid, constipation:  He has had some constipation and external hemorrhoids.  He tried preparation - H and it irritated the area.  He has used Vaseline and sitz baths.  He currently has no symptoms.   Blood from right side of nose:  He was cleaning his nose and had a nose bleed from the right side.  It lasted one day and has not had any bleeding since.   Tooth extraction scheduled for July 22.  He had a tooth abscess last May and it was treated with clindamycin.  He had some sensitivities and had to go back on antibiotics.  He is scheduled to have the tooth extracted and wanted to know if he can and should go ahead with it.  He wondered about the aspirin.   Hypertension: He is taking his medication daily. He is compliant with a low sodium diet.  He denies chest pain, palpitations, edema, shortness of breath and regular headaches.       Medications and allergies reviewed with patient and updated if appropriate.  Patient Active Problem List   Diagnosis Date Noted  . Weakness 05/21/2016  . Bilateral inguinal hernia 04/06/2016  . External hemorrhoid 02/25/2016  . Confusion 02/25/2016  . Poor balance 02/05/2016  . Right knee pain 02/05/2016  . Leg cramps 02/04/2016  . Essential hypertension, benign 12/26/2015  . Fatigue 09/19/2015  . Loss of weight 08/14/2015  . GERD (gastroesophageal reflux disease) 06/06/2015  . Skin cancer 09/14/2014  . History of GI diverticular  bleed 06/08/2014  . BRBPR (bright red blood per rectum) 06/01/2014  . Aphasia 12/26/2013  . History of short term memory loss 09/01/2013  . Anemia, unspecified 08/04/2013  . Hypoalbuminemia 08/04/2013  . Severe sepsis ([place]) 07/24/2013  . BPH (benign prostatic hyperplasia) 07/24/2013  . Other and unspecified hyperlipidemia 05/18/2013  . Glaucoma   . Incisional hernia, with obstruction, without gangrene 08/25/2010  . DIVERTICULOSIS, COLON 07/17/2009  . COLONIC POLYPS, HX OF 07/17/2009  . ALLERGIC RHINITIS 10/16/2008  . Other abnormal glucose 10/16/2008  . LIPOMAS, MULTIPLE 11/04/2007  . Rosacea 05/12/2007    Current Outpatient Prescriptions on File Prior to Visit  Medication Sig Dispense Refill  . aspirin EC 81 MG tablet Take 81 mg by mouth daily.     . Cholecalciferol (VITAMIN D) 1000 UNITS capsule Take 1,000 Units by mouth every morning.     . finasteride (PROSCAR) 5 MG tablet Take 1 tablet (5 mg total) by mouth daily. 30 tablet 5  . Ibuprofen 200 MG CAPS Take 2 capsules (400 mg total) by mouth 3 (three) times daily with meals as needed. (Patient taking differently: Take 400 mg by mouth 3 (three) times daily with meals as needed (pain). ) 1 each 0  . ketoconazole (NIZORAL) 2 % cream Apply 1 application topically 2 (two) times daily as needed  for irritation. 30 g 0  . mometasone (ELOCON) 0.1 % ointment APPLY TOPICALLY TO AFFECTED AREA(S) TWICE DAILY (Patient taking differently: APPLY TOPICALLY TO AFFECTED AREA(S) TWICE DAILY PRN FOR IRRITATION) 45 g 0  . montelukast (SINGULAIR) 10 MG tablet Take 1 tablet (10 mg total) by mouth at bedtime. (Patient taking differently: Take 10 mg by mouth at bedtime as needed (allergies). ) 30 tablet 1  . Multiple Vitamin (MULTIVITAMIN WITH MINERALS) TABS Take 1 tablet by mouth every morning.     . nystatin-triamcinolone (MYCOLOG II) cream Apply 1 application topically daily as needed (Applies to red places in groin area.). --- Please establish with new PCP  for further refills 30 g 0  . simethicone (MYLICON) 80 MG chewable tablet Chew 1 tablet (80 mg total) by mouth every 6 (six) hours as needed for flatulence. 30 tablet 0  . Timolol Maleate (ISTALOL) 0.5 % (DAILY) SOLN Place 1 drop into the left eye 2 (two) times daily at 8 am and 10 pm.     . valsartan (DIOVAN) 40 MG tablet Take 1 tablet (40 mg total) by mouth daily. 30 tablet 5   No current facility-administered medications on file prior to visit.    Past Medical History  Diagnosis Date  . Dermatophytosis of nail   . Hyperlipidemia   . Hearing loss   . Hyperplasia of prostate without urinary obstruction   . Cataracts, bilateral   . Rosacea     Dr. Marge Duncans  . Allergy   . Glaucoma     Dr. Edilia Bo  . Corneal abrasion     Dr. Ned Clines, [facility] Op[facility]  . Sepsis due to Escherichia coli (E. coli) ([place]) 05/2011    Morganella also cultured  . Diverticulosis   . TIA (transient ischemic attack) 9/6-05/2012    BP 221/99  . Epistaxis   . Aphasia 12/26/2013    Dr Jannifer Franklin  . History of GI diverticular bleed 710/15    Past Surgical History  Procedure Laterality Date  . Tonsillectomy and adenoidectomy    . Appendectomy      age 74  . Cataract extraction, bilateral  2003    Dr Ishmael Holter (now seen @ [place])  . Inguinal hernia repair      age 51  . Colonoscopy w/ polypectomy  06/2009    Dr.Jacobs; "miniscule polyp"  . Cholecystectomy  1978    stones  . Upper gi endoscopy  05/2014    negative  . Skin biopsy  12/2014    done by dermatologist     Social History   Social History  . Marital Status: Married    Spouse Name: N/A  . Number of Children: 1  . Years of Education: college   Occupational History  . [employer]    Social History Main Topics  . Smoking status: Never Smoker   . Smokeless tobacco: Never Used  . Alcohol Use: No  . Drug Use: No  . Sexual Activity: Not Asked   Other Topics Concern  . None   Social History Narrative   Lives with his wife.      Family  History  Problem Relation Age of Onset  . Stroke Mother 5  . Parkinsonism Brother     Brother died at age at age 44  . Pulmonary embolism Father 61    Post Op  . Diabetes Neg Hx   . Cancer Neg Hx   . COPD Neg Hx   . Heart disease Neg Hx  Review of Systems  Constitutional: Negative for fever.  Respiratory: Negative for cough, shortness of breath and wheezing.   Cardiovascular: Negative for chest pain, palpitations and leg swelling.  Gastrointestinal: Positive for abdominal pain and constipation. Negative for nausea.       Belching, No gerd  Neurological: Negative for dizziness, light-headedness and headaches.       Objective:   Filed Vitals:   06/08/16 1436  BP: 122/72  Pulse: 64  Temp: 97.8 F (36.6 C)   Filed Weights   06/08/16 1436  Weight: 143 lb (64.864 kg)   Body mass index is 23.09 kg/(m^2).   Physical Exam  Constitutional: He appears well-developed and well-nourished. No distress.  HENT:  Head: Normocephalic and atraumatic.  B/l nasal passageways without blood or scabs  Eyes: Conjunctivae are normal.  Neck: Neck supple. No tracheal deviation present. No thyromegaly present.  Cardiovascular: Normal rate, regular rhythm and normal heart sounds.   No murmur heard. Pulmonary/Chest: Effort normal and breath sounds normal. No respiratory distress. He has no wheezes. He has no rales.  Abdominal: Soft. He exhibits distension (large supraumbilical hernia, soft and non-tender). There is no tenderness. There is no rebound and no guarding.  Musculoskeletal: He exhibits no edema.  Lymphadenopathy:    He has no cervical adenopathy.  Skin: Skin is warm and dry. He is not diaphoretic.  Psychiatric: He has a normal mood and affect. His behavior is normal.        Assessment & Plan:   See Problem List for Assessment and Plan of chronic medical problems.

## 2016-06-08 NOTE — Patient Instructions (Signed)
   Medications reviewed and updated.  Changes include a cream for your hemorrhoids.  You can continue to use Vaseline and sitz baths.  Avoid constipation - use a stool softener as needed.   If you have the dental extraction stop the aspirin for 5 days prior.   If you have abdominal pain lay down in bed to see if that helps.   Your hernia may be causing the pain and laying down sometimes helps.

## 2016-06-08 NOTE — Progress Notes (Signed)
Pre visit review using our clinic review tool, if applicable. No additional management support is needed unless otherwise documented below in the visit note. 

## 2016-06-08 NOTE — Assessment & Plan Note (Deleted)
?   Cause of his abdominal pain - has had a few episodes in the past - ct scan done last year was normal except hernia Advised to lay down if he has pain  If pain is severe he does need to have it evaluated Recently had constipation which may have cause the pain

## 2016-06-20 ENCOUNTER — Other Ambulatory Visit: Payer: Self-pay | Admitting: Internal Medicine

## 2016-06-30 ENCOUNTER — Ambulatory Visit: Payer: MEDICARE | Admitting: Internal Medicine

## 2016-07-08 ENCOUNTER — Ambulatory Visit (INDEPENDENT_AMBULATORY_CARE_PROVIDER_SITE_OTHER): Payer: MEDICARE | Admitting: Internal Medicine

## 2016-07-08 ENCOUNTER — Ambulatory Visit (INDEPENDENT_AMBULATORY_CARE_PROVIDER_SITE_OTHER)
Admission: RE | Admit: 2016-07-08 | Discharge: 2016-07-08 | Disposition: A | Discharge status: Home / Self Care | Payer: MEDICARE | Source: Ambulatory Visit | Attending: Internal Medicine | Admitting: Internal Medicine

## 2016-07-08 VITALS — BP 142/74 | HR 65 | Temp 97.7°F | Resp 16 | Wt 141.0 lb

## 2016-07-08 DIAGNOSIS — M79671 Pain in right foot: Secondary | ICD-10-CM

## 2016-07-08 DIAGNOSIS — M7989 Other specified soft tissue disorders: Secondary | ICD-10-CM | POA: Diagnosis not present

## 2016-07-08 NOTE — Progress Notes (Signed)
Pre visit review using our clinic review tool, if applicable. No additional management support is needed unless otherwise documented below in the visit note. 

## 2016-07-08 NOTE — Patient Instructions (Signed)
Have the xray done and we will call you with the results.   Try icing the area.  Take advil with food.

## 2016-07-08 NOTE — Progress Notes (Signed)
Subjective:    Patient ID: Jack Noble, male    DOB: August 24, 1929, 82 y.o.   MRN: [id number]  HPI He is here for an acute visit.   Right foot:  For the past 1-2 months he has had pain and swelling on the top of his right foot. He denies any new activities or injury.  He is wearing a shoe in his house that may be irritating the top of his foot.  The pain and swelling is on the dorsal aspect and extended up to the ankle.  His symptoms have gotten better, but still puffy.  No skin changes.  He denies numbness/tingling.    Medications and allergies reviewed with patient and updated if appropriate.  Patient Active Problem List   Diagnosis Date Noted  . Abdominal pain 06/08/2016  . Epistaxis 06/08/2016  . Dental abscess 06/08/2016  . Weakness 05/21/2016  . Bilateral inguinal hernia 04/06/2016  . External hemorrhoid 02/25/2016  . Confusion 02/25/2016  . Poor balance 02/05/2016  . Right knee pain 02/05/2016  . Leg cramps 02/04/2016  . Essential hypertension, benign 12/26/2015  . Fatigue 09/19/2015  . GERD (gastroesophageal reflux disease) 06/06/2015  . Skin cancer 09/14/2014  . History of GI diverticular bleed 06/08/2014  . BRBPR (bright red blood per rectum) 06/01/2014  . Aphasia 12/26/2013  . History of short term memory loss 09/01/2013  . Hypoalbuminemia 08/04/2013  . Severe sepsis ([place]) 07/24/2013  . BPH (benign prostatic hyperplasia) 07/24/2013  . Other and unspecified hyperlipidemia 05/18/2013  . Glaucoma   . Incisional hernia, with obstruction, without gangrene 08/25/2010  . DIVERTICULOSIS, COLON 07/17/2009  . COLONIC POLYPS, HX OF 07/17/2009  . ALLERGIC RHINITIS 10/16/2008  . LIPOMAS, MULTIPLE 11/04/2007  . Rosacea 05/12/2007    Current Outpatient Prescriptions on File Prior to Visit  Medication Sig Dispense Refill  . aspirin EC 81 MG tablet Take 81 mg by mouth daily.     . Cholecalciferol (VITAMIN D) 1000 UNITS capsule Take 1,000 Units by mouth every morning.      . finasteride (PROSCAR) 5 MG tablet Take 1 tablet (5 mg total) by mouth daily. 30 tablet 5  . hydrocortisone (ANUSOL-HC) 2.5 % rectal cream Place 1 application rectally 2 (two) times daily. 30 g 0  . ketoconazole (NIZORAL) 2 % cream Apply 1 application topically 2 (two) times daily as needed for irritation. 30 g 0  . mometasone (ELOCON) 0.1 % ointment APPLY TOPICALLY TO AFFECTED AREA(S) TWICE DAILY (Patient taking differently: APPLY TOPICALLY TO AFFECTED AREA(S) TWICE DAILY PRN FOR IRRITATION) 45 g 0  . montelukast (SINGULAIR) 10 MG tablet Take 1 tablet (10 mg total) by mouth at bedtime. (Patient taking differently: Take 10 mg by mouth at bedtime as needed (allergies). ) 30 tablet 1  . Multiple Vitamin (MULTIVITAMIN WITH MINERALS) TABS Take 1 tablet by mouth every morning.     . nystatin-triamcinolone (MYCOLOG II) cream Apply 1 application topically daily as needed (Applies to red places in groin area.). --- Please establish with new PCP for further refills 30 g 0  . simethicone (MYLICON) 80 MG chewable tablet Chew 1 tablet (80 mg total) by mouth every 6 (six) hours as needed for flatulence. 30 tablet 0  . Timolol Maleate (ISTALOL) 0.5 % (DAILY) SOLN Place 1 drop into the left eye 2 (two) times daily at 8 am and 10 pm.     . valsartan (DIOVAN) 40 MG tablet Take 1 tablet (40 mg total) by mouth daily. 30 tablet 5  No current facility-administered medications on file prior to visit.     Past Medical History:  Diagnosis Date  . Allergy   . Aphasia 12/26/2013   Dr Jannifer Franklin  . Cataracts, bilateral   . Corneal abrasion    Dr. Ned Clines, [facility] Op[facility]  . Dermatophytosis of nail   . Diverticulosis   . Epistaxis   . Glaucoma    Dr. Edilia Bo  . Hearing loss   . History of GI diverticular bleed 710/15  . Hyperlipidemia   . Hyperplasia of prostate without urinary obstruction   . Rosacea    Dr. Marge Duncans  . Sepsis due to Escherichia coli (E. coli) ([place]) 05/2011   Morganella also cultured  . TIA (transient  ischemic attack) 9/6-05/2012   BP 221/99    Past Surgical History:  Procedure Laterality Date  . APPENDECTOMY     age 71  . CATARACT EXTRACTION, BILATERAL  2003   Dr Ishmael Holter (now seen @ [place])  . CHOLECYSTECTOMY  1978   stones  . COLONOSCOPY W/ POLYPECTOMY  06/2009   Dr.Jacobs; "miniscule polyp"  . INGUINAL HERNIA REPAIR     age 36  . SKIN BIOPSY  12/2014   done by dermatologist   . TONSILLECTOMY AND ADENOIDECTOMY    . UPPER GI ENDOSCOPY  05/2014   negative    Social History   Social History  . Marital status: Married    Spouse name: N/A  . Number of children: 1  . Years of education: college   Occupational History  . [employer] Retired   Social History Main Topics  . Smoking status: Never Smoker  . Smokeless tobacco: Never Used  . Alcohol use No  . Drug use: No  . Sexual activity: Not on file   Other Topics Concern  . Not on file   Social History Narrative   Lives with his wife.      Family History  Problem Relation Age of Onset  . Stroke Mother 65  . Parkinsonism Brother     Brother died at age at age 15  . Pulmonary embolism Father 66    Post Op  . Diabetes Neg Hx   . Cancer Neg Hx   . COPD Neg Hx   . Heart disease Neg Hx     Review of Systems  Constitutional: Negative for fever.  Skin: Negative for color change, rash and wound.  Neurological: Negative for numbness.       Objective:   Vitals:   07/08/16 1602  BP: (!) 142/74  Pulse: 65  Resp: 16  Temp: 97.7 F (36.5 C)   Filed Weights   07/08/16 1602  Weight: 141 lb (64 kg)   Body mass index is 22.76 kg/m.   Physical Exam  Constitutional: He appears well-developed and well-nourished. No distress.  Musculoskeletal: Normal range of motion. He exhibits no edema.  Normal ROM of right ankle/foot/toes, minimal tenderness with palpation on dorsal aspect of foot and MTP joints of 1-3 toes, mild swelling on dorsal aspect of foot  Skin: Skin is warm and dry. No rash noted. He  is not diaphoretic. No erythema.          Assessment & Plan:   See Problem List for Assessment and Plan of chronic medical problems.

## 2016-07-09 ENCOUNTER — Encounter: Payer: Self-pay | Admitting: Internal Medicine

## 2016-07-09 DIAGNOSIS — M79671 Pain in right foot: Secondary | ICD-10-CM | POA: Insufficient documentation

## 2016-07-09 NOTE — Assessment & Plan Note (Signed)
Right dorsal foot pain and swelling Started 1-2 months ago without obvious cause Has improved and is not very mild Gout seems unlikely, fracture unlikely, possible irritation to shoe or arthritis Will check xray given duration Advil, ice, tylenol Return if no improvement

## 2016-07-13 NOTE — Progress Notes (Addendum)
Subjective:   Jack Noble is a 82 y.o. male who presents for Medicare Annual/Subsequent preventive examination.   HRA assessment completed during this visit with Jack Noble   The Patient was informed that the wellness visit is to identify future health risk and educate and initiate measures that can reduce risk for increased disease through the lifespan.    NO ROS; Medicare Wellness Visit  Psychosocial: (mother stroke; father pulmonary embolism;Brother parkinson) Lost son 38' via biking accident  PMH:   Hyperlipidemia  Aphasia;   Medications reviewed for issues; compliance; otc meds  BMI: 23.5   Diet Pimento cheese homemade and very little sodium This am had cinnamon toast; fruit with cottage cheese Fresh peaches;  Lunch; usually [place] sandwich; Fruit Vegetable Pineapple Supper; cooks most of the time blackeyed peas; carrots; vegetables; Tomatoes; cottage cheese   Exercise;   Both played golf in the past 25 to 30 years; stopped because the weather got to hot Volunteers at church now [employer] 3 miles a week- 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   Personal safety issues reviewed:  1. safe community / yes 2.  smoke detector/ yes 3.  firearms safety if applicable / yes 4. protection when in the sun; does not go in the sun  5. driving safety for seniors or any recent accidents. No accidents    Risk for Depression reviewed: Any emotional problems? Anxious, depressed, irritable, sad or blue? no Denies feeling depressed or hopeless; voices pleasure in daily life How many social activities have you been engaged in within the last 2 weeks? no Who would help you with chores; illness; shopping  other?   Cognitive; no issues  Neurologist is Dr. Jannifer Franklin -  He has had ST; Dr. Jannifer Franklin (per the wife) did a CT and it was normal; with normal brain function; word finding issues but manages without a lot of distress.  Manages checkbook, medications; no failures of task Ad8 score reviewed for issues;  Issues making decisions; no  Less interest in hobbies / activities" no  Repeats questions, stories; family complaining: NO  Trouble using ordinary gadgets; microwave; computer: no  Forgets the month or year: no  Mismanaging finances: no  Missing apt: no but does write them down  Daily problems with thinking of memory NO Ad8 score is 0  MMSE not appropriate unless AD8 score is > 2   Advanced Directive addressed; YES; completed per the wife and will bring a copy   Counseling Health Maintenance Gaps: Zostavax; educated; will discuss with Dr. Quay Burow   Keep in mind the flu shot is an inactivated vaccine and takes at least 2 weeks to build immunity. The flu virus can be dormant for 4 days prior to symptoms Taking the flu shot at the beginning of the season can reduce the risk for the entire community.  Mr. Byce did not want to take his flu shot today; preferred to take high does in Sept. Both he and wife schedule apt in flu clinic to come back    Colonoscopy; 06/2009- aged out EKG: 05/2009 Prostate cancer screening: 04/2011   Hearing: adequate;   Ophthalmology exam/ he has glaucoma in OS; Scratch in right Mr Krauskopf gets checked q  6 month  Dr. Violeta Gelinas; [facility] for corneal Dr. Edilia Bo other  Immunizations Due: (Vaccines reviewed and educated regarding any overdue)    Established and updated Risk reviewed and appropriate referral made or health recommendations:  Current Care Team reviewed and updated        Objective:    Vitals: BP (!) 150/80   Ht 5\' 5"  (1.651 m)   Wt 141 lb 8 oz (64.2 kg)   BMI 23.55 kg/m   Body mass index is 23.55 kg/m.  Wife states she checks BP  at home and always within normal range [id number] systolic over 80  Tobacco History  Smoking Status  . Never Smoker  Smokeless Tobacco  . Never Used     Counseling given: Yes   Past Medical History:  Diagnosis Date  . Allergy   . Aphasia 12/26/2013   Dr Jannifer Franklin  . Cataracts, bilateral   . Corneal abrasion    Dr. Ned Clines, [facility]  . Dermatophytosis of nail   . Diverticulosis   . Epistaxis   . Glaucoma    Dr. Edilia Bo  . Hearing loss   . History of GI diverticular bleed 710/15  . Hyperlipidemia   . Hyperplasia of prostate without urinary obstruction   . Rosacea    Dr. Marge Duncans  . Sepsis due to Escherichia coli (E. coli) ([place]) 05/2011   Morganella also cultured  . TIA (transient ischemic attack) 9/6-05/2012   BP 221/99   Past Surgical History:  Procedure Laterality Date  . APPENDECTOMY     age 74  . CATARACT EXTRACTION, BILATERAL  2003   Dr Ishmael Holter (now seen @ [facility])  . CHOLECYSTECTOMY  1978   stones  . COLONOSCOPY W/ POLYPECTOMY  06/2009   Dr.Jacobs; "miniscule polyp"  . INGUINAL HERNIA REPAIR     age 60  . SKIN BIOPSY  12/2014   done by dermatologist   . TONSILLECTOMY AND ADENOIDECTOMY    . UPPER GI ENDOSCOPY  05/2014   negative   Family History  Problem Relation Age of Onset  . Stroke Mother 56  . Parkinsonism Brother     Brother died at age at age 16  . Pulmonary embolism Father 45    Post Op  . Diabetes Neg Hx   . Cancer Neg Hx   . COPD Neg Hx   . Heart disease Neg Hx    History  Sexual Activity  . Sexual activity: Not on file    Outpatient Encounter Prescriptions as of 07/14/2016  Medication Sig  . aspirin EC 81 MG tablet Take 81 mg by mouth daily.   . Cholecalciferol (VITAMIN D) 1000 UNITS capsule Take 1,000 Units by mouth every morning.   . finasteride (PROSCAR) 5 MG tablet Take 1 tablet (5 mg total) by mouth daily.  . hydrocortisone (ANUSOL-HC) 2.5 % rectal cream Place 1 application rectally 2 (two) times daily.  Marland Kitchen ketoconazole (NIZORAL) 2 %  cream Apply 1 application topically 2 (two) times daily as needed for irritation.  . mometasone (ELOCON) 0.1 % ointment APPLY TOPICALLY TO AFFECTED AREA(S) TWICE DAILY (Patient taking differently: APPLY TOPICALLY TO AFFECTED AREA(S) TWICE DAILY PRN FOR IRRITATION)  . montelukast (SINGULAIR) 10 MG tablet Take 1 tablet (10 mg total) by mouth at bedtime. (Patient taking differently: Take 10 mg by mouth at bedtime as needed (allergies). )  . Multiple Vitamin (MULTIVITAMIN WITH MINERALS) TABS Take 1 tablet by mouth every morning.   . nystatin-triamcinolone (MYCOLOG II) cream Apply 1 application topically daily  as needed (Applies to red places in groin area.). --- Please establish with new PCP for further refills  . simethicone (MYLICON) 80 MG chewable tablet Chew 1 tablet (80 mg total) by mouth every 6 (six) hours as needed for flatulence.  . Timolol Maleate (ISTALOL) 0.5 % (DAILY) SOLN Place 1 drop into the left eye 2 (two) times daily at 8 am and 10 pm.   . valsartan (DIOVAN) 40 MG tablet Take 1 tablet (40 mg total) by mouth daily.   No facility-administered encounter medications on file as of 07/14/2016.     Activities of Daily Living In your present state of health, do you have any difficulty performing the following activities: 07/14/2016  Hearing? N  Vision? N  Difficulty concentrating or making decisions? N  Walking or climbing stairs? N  Dressing or bathing? N  Doing errands, shopping? N  Preparing Food and eating ? N  Using the Toilet? N  In the past six months, have you accidently leaked urine? N  Do you have problems with loss of bowel control? N  Managing your Medications? N  Managing your Finances? N  Housekeeping or managing your Housekeeping? N  Some recent data might be hidden    Patient Care Team: Binnie Rail, MD as PCP - General (Internal Medicine)   Assessment:    Continue to check BP at home   Exercise Activities and Dietary recommendations Stay active    Goals     . patient          Would like to walk a little and maintain his health       Fall Risk Fall Risk  07/14/2016 07/08/2016 02/06/2015 05/18/2013  Falls in the past year? Yes Yes No No  Number falls in past yr: - 1 - -  Injury with Fall? - No - -  Risk for fall due to : - History of fall(s) - -   Depression Screen PHQ 2/9 Scores 07/14/2016 07/08/2016 02/06/2015 05/18/2013  PHQ - 2 Score 0 0 0 0    Cognitive Testing MMSE - Mini Mental State Exam 09/30/2015  Orientation to time 5  Orientation to Place 2  Registration 3  Attention/ Calculation 2  Recall 0  Language- name 2 objects 2  Language- repeat 0  Language- follow 3 step command 1  Language- read & follow direction 1  Write a sentence 1  Copy design 0  Total score 17   Wife states memory is not an issue; Works puzzles etc. No confusion regarding what he wants to say, just difficulty word finding,  Immunization History  Administered Date(s) Administered  . Influenza Split 11/03/2011, 08/23/2012  . Influenza, High Dose Seasonal PF 08/15/2014  . Influenza,inj,Quad PF,36+ Mos 09/01/2013, 08/06/2015  . Pneumococcal Conjugate-13 07/16/2015  . Pneumococcal Polysaccharide-23 01/26/2008  . Tdap 02/15/2004, 06/05/2015   Screening Tests Health Maintenance  Topic Date Due  . ZOSTAVAX  01/13/1991  . INFLUENZA VACCINE  06/23/2016  . TETANUS/TDAP  06/04/2025  . PNA vac Low Risk Adult  Completed      Plan:     Will come back and take High does flu shot in Sept  Will discuss taking zostavax with Dr. Quay Burow; has had shingles;   During the course of the visit the patient was educated and counseled about the following appropriate screening and preventive services:   Vaccines to include Pneumoccal, Influenza, Hepatitis B, Td, Zostavax, HCV  Electrocardiogram  Cardiovascular Disease  Colorectal cancer screening  Diabetes screening  Prostate Cancer Screening  Glaucoma screening  Nutrition counseling   Smoking cessation  counseling  Patient Instructions (the written plan) was given to the patient.    Wynetta Fines, RN  07/14/2016  Medical screening examination/treatment/procedure(s) were performed by non-physician practitioner and as supervising physician I was immediately available for consultation/collaboration. I agree with above. Binnie Rail, MD

## 2016-07-14 ENCOUNTER — Ambulatory Visit: Payer: MEDICARE

## 2016-07-14 VITALS — BP 150/80 | Ht 65.0 in | Wt 141.5 lb

## 2016-07-14 DIAGNOSIS — Z0000 Encounter for general adult medical examination without abnormal findings: Secondary | ICD-10-CM

## 2016-07-14 NOTE — Patient Instructions (Addendum)
Jack Noble , Thank you for taking time to come for your Medicare Wellness Visit. I appreciate your ongoing commitment to your health goals. Please review the following plan we discussed and let me know if I can assist you in the future.   Will come back to take High does flu shot in September  Can discuss taking shingles vaccine with Dr. Quay Burow  Educated to check with insurance regarding coverage of Shingles vaccination on Part D or Part B and may have lower co-pay if provided on the Part D side     These are the goals we discussed: Goals    . patient          Would like to walk a little and maintain his health        This is a list of the screening recommended for you and due dates:  Health Maintenance  Topic Date Due  . Shingles Vaccine  01/13/1991  . Flu Shot  06/23/2016  . Tetanus Vaccine  06/04/2025  . Pneumonia vaccines  Completed     Fall Prevention in the Home  Falls can cause injuries. They can happen to people of all ages. There are many things you can do to make your home safe and to help prevent falls.  WHAT CAN I DO ON THE OUTSIDE OF MY HOME?  Regularly fix the edges of walkways and driveways and fix any cracks.  Remove anything that might make you trip as you walk through a door, such as a raised step or threshold.  Trim any bushes or trees on the path to your home.  Use bright outdoor lighting.  Clear any walking paths of anything that might make someone trip, such as rocks or tools.  Regularly check to see if handrails are loose or broken. Make sure that both sides of any steps have handrails.  Any raised decks and porches should have guardrails on the edges.  Have any leaves, snow, or ice cleared regularly.  Use sand or salt on walking paths during winter.  Clean up any spills in your garage right away. This includes oil or grease spills. WHAT CAN I DO IN THE BATHROOM?   Use night lights.  Install grab bars by the toilet and in the tub and  shower. Do not use towel bars as grab bars.  Use non-skid mats or decals in the tub or shower.  If you need to sit down in the shower, use a plastic, non-slip stool.  Keep the floor dry. Clean up any water that spills on the floor as soon as it happens.  Remove soap buildup in the tub or shower regularly.  Attach bath mats securely with double-sided non-slip rug tape.  Do not have throw rugs and other things on the floor that can make you trip. WHAT CAN I DO IN THE BEDROOM?  Use night lights.  Make sure that you have a light by your bed that is easy to reach.  Do not use any sheets or blankets that are too big for your bed. They should not hang down onto the floor.  Have a firm chair that has side arms. You can use this for support while you get dressed.  Do not have throw rugs and other things on the floor that can make you trip. WHAT CAN I DO IN THE KITCHEN?  Clean up any spills right away.  Avoid walking on wet floors.  Keep items that you use a lot in easy-to-reach places.  If you need to reach something above you, use a strong step stool that has a grab bar.  Keep electrical cords out of the way.  Do not use floor polish or wax that makes floors slippery. If you must use wax, use non-skid floor wax.  Do not have throw rugs and other things on the floor that can make you trip. WHAT CAN I DO WITH MY STAIRS?  Do not leave any items on the stairs.  Make sure that there are handrails on both sides of the stairs and use them. Fix handrails that are broken or loose. Make sure that handrails are as long as the stairways.  Check any carpeting to make sure that it is firmly attached to the stairs. Fix any carpet that is loose or worn.  Avoid having throw rugs at the top or bottom of the stairs. If you do have throw rugs, attach them to the floor with carpet tape.  Make sure that you have a light switch at the top of the stairs and the bottom of the stairs. If you do not  have them, ask someone to add them for you. WHAT ELSE CAN I DO TO HELP PREVENT FALLS?  Wear shoes that:  Do not have high heels.  Have rubber bottoms.  Are comfortable and fit you well.  Are closed at the toe. Do not wear sandals.  If you use a stepladder:  Make sure that it is fully opened. Do not climb a closed stepladder.  Make sure that both sides of the stepladder are locked into place.  Ask someone to hold it for you, if possible.  Clearly mark and make sure that you can see:  Any grab bars or handrails.  First and last steps.  Where the edge of each step is.  Use tools that help you move around (mobility aids) if they are needed. These include:  Canes.  Walkers.  Scooters.  Crutches.  Turn on the lights when you go into a dark area. Replace any light bulbs as soon as they burn out.  Set up your furniture so you have a clear path. Avoid moving your furniture around.  If any of your floors are uneven, fix them.  If there are any pets around you, be aware of where they are.  Review your medicines with your doctor. Some medicines can make you feel dizzy. This can increase your chance of falling. Ask your doctor what other things that you can do to help prevent falls.   This information is not intended to replace advice given to you by your health care provider. Make sure you discuss any questions you have with your health care provider.   Document Released: 09/05/2009 Document Revised: 03/26/2015 Document Reviewed: 12/14/2014 Elsevier Interactive Patient Education 2016 [place] Maintenance, Male A healthy lifestyle and preventative care can promote health and wellness.  Maintain regular health, dental, and eye exams.  Eat a healthy diet. Foods like vegetables, fruits, whole grains, low-fat dairy products, and lean protein foods contain the nutrients you need and are low in calories. Decrease your intake of foods high in solid fats, added  sugars, and salt. Get information about a proper diet from your health care provider, if necessary.  Regular physical exercise is one of the most important things you can do for your health. Most adults should get at least 150 minutes of moderate-intensity exercise (any activity that increases your heart rate and causes you to sweat) each week.  In addition, most adults need muscle-strengthening exercises on 2 or more days a week.   Maintain a healthy weight. The body mass index (BMI) is a screening tool to identify possible weight problems. It provides an estimate of body fat based on height and weight. Your health care provider can find your BMI and can help you achieve or maintain a healthy weight. For males 20 years and older:  A BMI below 18.5 is considered underweight.  A BMI of 18.5 to 24.9 is normal.  A BMI of 25 to 29.9 is considered overweight.  A BMI of 30 and above is considered obese.  Maintain normal blood lipids and cholesterol by exercising and minimizing your intake of saturated fat. Eat a balanced diet with plenty of fruits and vegetables. Blood tests for lipids and cholesterol should begin at age 36 and be repeated every 5 years. If your lipid or cholesterol levels are high, you are over age 93, or you are at high risk for heart disease, you may need your cholesterol levels checked more frequently.Ongoing high lipid and cholesterol levels should be treated with medicines if diet and exercise are not working.  If you smoke, find out from your health care provider how to quit. If you do not use tobacco, do not start.  Lung cancer screening is recommended for adults aged 63-80 years who are at high risk for developing lung cancer because of a history of smoking. A yearly low-dose CT scan of the lungs is recommended for people who have at least a 30-pack-year history of smoking and are current smokers or have quit within the past 15 years. A pack year of smoking is smoking an  average of 1 pack of cigarettes a day for 1 year (for example, a 30-pack-year history of smoking could mean smoking 1 pack a day for 30 years or 2 packs a day for 15 years). Yearly screening should continue until the smoker has stopped smoking for at least 15 years. Yearly screening should be stopped for people who develop a health problem that would prevent them from having lung cancer treatment.  If you choose to drink alcohol, do not have more than 2 drinks per day. One drink is considered to be 12 oz (360 mL) of beer, 5 oz (150 mL) of wine, or 1.5 oz (45 mL) of liquor.  Avoid the use of street drugs. Do not share needles with anyone. Ask for help if you need support or instructions about stopping the use of drugs.  High blood pressure causes heart disease and increases the risk of stroke. High blood pressure is more likely to develop in:  People who have blood pressure in the end of the normal range (100-139/85-89 mm Hg).  People who are overweight or obese.  People who are African American.  If you are 80-20 years of age, have your blood pressure checked every 3-5 years. If you are 27 years of age or older, have your blood pressure checked every year. You should have your blood pressure measured twice--once when you are at a hospital or clinic, and once when you are not at a hospital or clinic. Record the average of the two measurements. To check your blood pressure when you are not at a hospital or clinic, you can use:  An automated blood pressure machine at a pharmacy.  A home blood pressure monitor.  If you are 63-14 years old, ask your health care provider if you should take aspirin to prevent heart disease.  Diabetes screening involves taking a blood sample to check your fasting blood sugar level. This should be done once every 3 years after age 38 if you are at a normal weight and without risk factors for diabetes. Testing should be considered at a younger age or be carried out more  frequently if you are overweight and have at least 1 risk factor for diabetes.  Colorectal cancer can be detected and often prevented. Most routine colorectal cancer screening begins at the age of 22 and continues through age 89. However, your health care provider may recommend screening at an earlier age if you have risk factors for colon cancer. On a yearly basis, your health care provider may provide home test kits to check for hidden blood in the stool. A small camera at the end of a tube may be used to directly examine the colon (sigmoidoscopy or colonoscopy) to detect the earliest forms of colorectal cancer. Talk to your health care provider about this at age 56 when routine screening begins. A direct exam of the colon should be repeated every 5-10 years through age 32, unless early forms of precancerous polyps or small growths are found.  People who are at an increased risk for hepatitis B should be screened for this virus. You are considered at high risk for hepatitis B if:  You were born in a country where hepatitis B occurs often. Talk with your health care provider about which countries are considered high risk.  Your parents were born in a high-risk country and you have not received a shot to protect against hepatitis B (hepatitis B vaccine).  You have HIV or AIDS.  You use needles to inject street drugs.  You live with, or have sex with, someone who has hepatitis B.  You are a man who has sex with other men (MSM).  You get hemodialysis treatment.  You take certain medicines for conditions like cancer, organ transplantation, and autoimmune conditions.  Hepatitis C blood testing is recommended for all people born from [birth year] through 1963 and any individual with known risk factors for hepatitis C.  Healthy men should no longer receive prostate-specific antigen (PSA) blood tests as part of routine cancer screening. Talk to your health care provider about prostate cancer  screening.  Testicular cancer screening is not recommended for adolescents or adult males who have no symptoms. Screening includes self-exam, a health care provider exam, and other screening tests. Consult with your health care provider about any symptoms you have or any concerns you have about testicular cancer.  Practice safe sex. Use condoms and avoid high-risk sexual practices to reduce the spread of sexually transmitted infections (STIs).  You should be screened for STIs, including gonorrhea and chlamydia if:  You are sexually active and are younger than 24 years.  You are older than 24 years, and your health care provider tells you that you are at risk for this type of infection.  Your sexual activity has changed since you were last screened, and you are at an increased risk for chlamydia or gonorrhea. Ask your health care provider if you are at risk.  If you are at risk of being infected with HIV, it is recommended that you take a prescription medicine daily to prevent HIV infection. This is called pre-exposure prophylaxis (PrEP). You are considered at risk if:  You are a man who has sex with other men (MSM).  You are a heterosexual man who is sexually active with multiple partners.  You take  drugs by injection.  You are sexually active with a partner who has HIV.  Talk with your health care provider about whether you are at high risk of being infected with HIV. If you choose to begin PrEP, you should first be tested for HIV. You should then be tested every 3 months for as long as you are taking PrEP.  Use sunscreen. Apply sunscreen liberally and repeatedly throughout the day. You should seek shade when your shadow is shorter than you. Protect yourself by wearing long sleeves, pants, a wide-brimmed hat, and sunglasses year round whenever you are outdoors.  Tell your health care provider of new moles or changes in moles, especially if there is a change in shape or color. Also, tell  your health care provider if a mole is larger than the size of a pencil eraser.  A one-time screening for abdominal aortic aneurysm (AAA) and surgical repair of large AAAs by ultrasound is recommended for men aged 13-75 years who are current or former smokers.  Stay current with your vaccines (immunizations).   This information is not intended to replace advice given to you by your health care provider. Make sure you discuss any questions you have with your health care provider.   Document Released: 05/07/2008 Document Revised: 11/30/2014 Document Reviewed: 04/06/2011 Elsevier Interactive Patient Education [employer].

## 2016-07-17 ENCOUNTER — Other Ambulatory Visit: Payer: Self-pay

## 2016-07-21 ENCOUNTER — Ambulatory Visit (INDEPENDENT_AMBULATORY_CARE_PROVIDER_SITE_OTHER): Payer: MEDICARE

## 2016-07-21 DIAGNOSIS — Z23 Encounter for immunization: Secondary | ICD-10-CM | POA: Diagnosis not present

## 2016-08-03 ENCOUNTER — Telehealth: Payer: Self-pay | Admitting: Internal Medicine

## 2016-08-03 NOTE — Telephone Encounter (Signed)
PLEASE NOTE: All timestamps contained within this report are represented as [place] Standard Time. CONFIDENTIALTY NOTICE: This fax transmission is intended only for the addressee. It contains information that is legally privileged, confidential or otherwise protected from use or disclosure. If you are not the intended recipient, you are strictly prohibited from reviewing, disclosing, copying using or disseminating any of this information or taking any action in reliance on or regarding this information. If you have received this fax in error, please notify us immediately by telephone so that we can arrange for its return to [place]. Phone: [phone], Toll-Free: [phone], Fax: [phone] Page: 1 of 1 Call Id: [id number] [place] Patient Name: Jack Noble DOB: Aug 24, 1929 Initial Comment Caller states that her husband is having abd pain and a HX of hernias Nurse Assessment Nurse: Dimas Chyle, RN, Dellis Filbert Date/Time (Eastern Time): 08/03/2016 1:06:53 PM Confirm and document reason for call. If symptomatic, describe symptoms. You must click the next button to save text entered. ---Caller states that her husband is having abdominal pain and a HX of hernias. Started today. Knot in groin. Has the patient traveled out of the country within the last 30 days? ---No Does the patient have any new or worsening symptoms? ---Yes Will a triage be completed? ---Yes Related visit to physician within the last 2 weeks? ---N/A Does the PT have any chronic conditions? (i.e. diabetes, asthma, etc.) ---Yes List chronic conditions. ---HTN Is this a behavioral health or substance abuse call? ---No Guidelines Guideline Title Affirmed Question Affirmed Notes Abdominal Pain - Male [1] SEVERE pain AND [2] age > 55 Final Disposition User Go to ED Now Dimas Chyle, RN, [place] - ED Disagree/Comply: Comply

## 2016-08-10 ENCOUNTER — Encounter: Payer: Self-pay | Admitting: Internal Medicine

## 2016-08-10 ENCOUNTER — Ambulatory Visit (INDEPENDENT_AMBULATORY_CARE_PROVIDER_SITE_OTHER): Payer: MEDICARE | Admitting: Internal Medicine

## 2016-08-10 VITALS — BP 108/78 | HR 57 | Temp 97.7°F | Resp 16 | Ht 65.0 in | Wt 140.0 lb

## 2016-08-10 DIAGNOSIS — M79671 Pain in right foot: Secondary | ICD-10-CM | POA: Diagnosis not present

## 2016-08-10 DIAGNOSIS — K439 Ventral hernia without obstruction or gangrene: Secondary | ICD-10-CM | POA: Insufficient documentation

## 2016-08-10 MED ORDER — ABDOMINAL BINDER/ELASTIC LARGE MISC: 0 refills | Status: AC

## 2016-08-10 MED ORDER — DICLOFENAC SODIUM 2 % TD SOLN: TRANSDERMAL | 0 refills | Status: DC

## 2016-08-10 NOTE — Assessment & Plan Note (Signed)
Persistent pain Xray showed arthritis only Tight shoes cause pain, no pain with walking Trial of topical pennsaid - sample given Avoid tight shoes  if no improvement will refer to Dr Tamala Julian

## 2016-08-10 NOTE — Assessment & Plan Note (Signed)
Ventral/incisional hernia - large Causes intermittent discomfort or pain Would like to avoid surgery and I agree Trial of an abdominal binder - rx given

## 2016-08-10 NOTE — Progress Notes (Signed)
Pre visit review using our clinic review tool, if applicable. No additional management support is needed unless otherwise documented below in the visit note. 

## 2016-08-10 NOTE — Patient Instructions (Signed)
A prescription for an abdominal binder was given.  Try that for the hernia.   If your foot pain does not improve let me know and I will refer to Dr Tamala Julian.

## 2016-08-10 NOTE — Progress Notes (Signed)
Subjective:    Patient ID: Jack Noble, male    DOB: 1929-05-31, 82 y.o.   MRN: [id number]  HPI He is here for an acute visit.   Abdominal pain:  He has had abdominal pain for a while.  He has had recurrent pain that has been tought to be related to his large abdominal hernia.  He started having increased pain today after eating this morning, but has intermittent pain.  He tries to avoid lifting.  Laying down often helps.  His bowels are normal      Right foot pain:  He still has some pain on the dorsal aspect of his right foot.  He only has pain when he has pressre from his shoe.  Loose shoes do not cause pain.  He had an xray when he was here last and it only showed some arthritis.   Medications and allergies reviewed with patient and updated if appropriate.  Patient Active Problem List   Diagnosis Date Noted  . Foot pain, right 07/09/2016  . Abdominal pain 06/08/2016  . Epistaxis 06/08/2016  . Dental abscess 06/08/2016  . Weakness 05/21/2016  . Bilateral inguinal hernia 04/06/2016  . External hemorrhoid 02/25/2016  . Confusion 02/25/2016  . Poor balance 02/05/2016  . Right knee pain 02/05/2016  . Leg cramps 02/04/2016  . Essential hypertension, benign 12/26/2015  . Fatigue 09/19/2015  . GERD (gastroesophageal reflux disease) 06/06/2015  . Skin cancer 09/14/2014  . History of GI diverticular bleed 06/08/2014  . BRBPR (bright red blood per rectum) 06/01/2014  . Aphasia 12/26/2013  . History of short term memory loss 09/01/2013  . Hypoalbuminemia 08/04/2013  . Severe sepsis ([place]) 07/24/2013  . BPH (benign prostatic hyperplasia) 07/24/2013  . Other and unspecified hyperlipidemia 05/18/2013  . Glaucoma   . Incisional hernia, with obstruction, without gangrene 08/25/2010  . DIVERTICULOSIS, COLON 07/17/2009  . COLONIC POLYPS, HX OF 07/17/2009  . ALLERGIC RHINITIS 10/16/2008  . LIPOMAS, MULTIPLE 11/04/2007  . Rosacea 05/12/2007    Current Outpatient Prescriptions  on File Prior to Visit  Medication Sig Dispense Refill  . aspirin EC 81 MG tablet Take 81 mg by mouth daily.     . Cholecalciferol (VITAMIN D) 1000 UNITS capsule Take 1,000 Units by mouth every morning.     . finasteride (PROSCAR) 5 MG tablet Take 1 tablet (5 mg total) by mouth daily. 30 tablet 5  . hydrocortisone (ANUSOL-HC) 2.5 % rectal cream Place 1 application rectally 2 (two) times daily. 30 g 0  . ketoconazole (NIZORAL) 2 % cream Apply 1 application topically 2 (two) times daily as needed for irritation. 30 g 0  . mometasone (ELOCON) 0.1 % ointment APPLY TOPICALLY TO AFFECTED AREA(S) TWICE DAILY (Patient taking differently: APPLY TOPICALLY TO AFFECTED AREA(S) TWICE DAILY PRN FOR IRRITATION) 45 g 0  . montelukast (SINGULAIR) 10 MG tablet Take 1 tablet (10 mg total) by mouth at bedtime. (Patient taking differently: Take 10 mg by mouth at bedtime as needed (allergies). ) 30 tablet 1  . Multiple Vitamin (MULTIVITAMIN WITH MINERALS) TABS Take 1 tablet by mouth every morning.     . nystatin-triamcinolone (MYCOLOG II) cream Apply 1 application topically daily as needed (Applies to red places in groin area.). --- Please establish with new PCP for further refills 30 g 0  . simethicone (MYLICON) 80 MG chewable tablet Chew 1 tablet (80 mg total) by mouth every 6 (six) hours as needed for flatulence. 30 tablet 0  . Timolol Maleate (ISTALOL)  0.5 % (DAILY) SOLN Place 1 drop into the left eye 2 (two) times daily at 8 am and 10 pm.     . valsartan (DIOVAN) 40 MG tablet Take 1 tablet (40 mg total) by mouth daily. 30 tablet 5   No current facility-administered medications on file prior to visit.     Past Medical History:  Diagnosis Date  . Allergy   . Aphasia 12/26/2013   Dr Jannifer Franklin  . Cataracts, bilateral   . Corneal abrasion    Dr. Ned Clines, [facility] Op[facility]  . Dermatophytosis of nail   . Diverticulosis   . Epistaxis   . Glaucoma    Dr. Edilia Bo  . Hearing loss   . History of GI diverticular bleed 710/15    . Hyperlipidemia   . Hyperplasia of prostate without urinary obstruction   . Rosacea    Dr. Marge Duncans  . Sepsis due to Escherichia coli (E. coli) ([place]) 05/2011   Morganella also cultured  . TIA (transient ischemic attack) 9/6-05/2012   BP 221/99    Past Surgical History:  Procedure Laterality Date  . APPENDECTOMY     age 22  . CATARACT EXTRACTION, BILATERAL  2003   Dr Ishmael Holter (now seen @ [place])  . CHOLECYSTECTOMY  1978   stones  . COLONOSCOPY W/ POLYPECTOMY  06/2009   Dr.Jacobs; "miniscule polyp"  . INGUINAL HERNIA REPAIR     age 14  . SKIN BIOPSY  12/2014   done by dermatologist   . TONSILLECTOMY AND ADENOIDECTOMY    . UPPER GI ENDOSCOPY  05/2014   negative    Social History   Social History  . Marital status: Married    Spouse name: N/A  . Number of children: 1  . Years of education: college   Occupational History  . [employer] Retired   Social History Main Topics  . Smoking status: Never Smoker  . Smokeless tobacco: Never Used  . Alcohol use No  . Drug use: No  . Sexual activity: Not on file   Other Topics Concern  . Not on file   Social History Narrative   Lives with his wife.      Family History  Problem Relation Age of Onset  . Stroke Mother 67  . Parkinsonism Brother     Brother died at age at age 68  . Pulmonary embolism Father 58    Post Op  . Diabetes Neg Hx   . Cancer Neg Hx   . COPD Neg Hx   . Heart disease Neg Hx     Review of Systems  Constitutional: Negative for fever.  Cardiovascular: Negative for leg swelling.  Gastrointestinal: Positive for abdominal pain. Negative for constipation, diarrhea and nausea.  Musculoskeletal:       Right foot pain - dorsal aspect       Objective:   Vitals:   08/10/16 1118  BP: 108/78  Pulse: (!) 57  Resp: 16  Temp: 97.7 F (36.5 C)   Filed Weights   08/10/16 1118  Weight: 140 lb (63.5 kg)   Body mass index is 23.3 kg/m.   Physical Exam    Constitutional: Appears  well-developed and well-nourished. No distress.  Abdomen: soft, non-tender, larger ventral/incisional hernia that is non-tender and reducible   Musculoskeletal: No edema.  Lymphadenopathy: No cervical adenopathy.  Skin: Skin is warm and dry. Not diaphoretic.  Psychiatric: Normal mood and affect. Behavior is normal.     Assessment & Plan:    See Problem  List for Assessment and Plan of chronic medical problems.

## 2016-08-18 ENCOUNTER — Other Ambulatory Visit: Payer: Self-pay | Admitting: Internal Medicine

## 2016-08-20 ENCOUNTER — Other Ambulatory Visit: Payer: Self-pay | Admitting: Internal Medicine

## 2016-09-09 ENCOUNTER — Encounter: Payer: Self-pay | Admitting: Internal Medicine

## 2016-09-09 ENCOUNTER — Ambulatory Visit (INDEPENDENT_AMBULATORY_CARE_PROVIDER_SITE_OTHER): Payer: MEDICARE | Admitting: Internal Medicine

## 2016-09-09 VITALS — BP 144/72 | HR 57 | Temp 97.6°F | Resp 16 | Wt 142.0 lb

## 2016-09-09 DIAGNOSIS — K4021 Bilateral inguinal hernia, without obstruction or gangrene, recurrent: Secondary | ICD-10-CM

## 2016-09-09 DIAGNOSIS — T63441A Toxic effect of venom of bees, accidental (unintentional), initial encounter: Secondary | ICD-10-CM | POA: Diagnosis not present

## 2016-09-09 NOTE — Assessment & Plan Note (Signed)
Known b/l inguinal hernias - not new Recent tenderness on right side, which he has experienced in the past No current pain, but there is a palpable hernia Discussed surgery and we both feel we should avoid this Lay done when pain starts, which does help Discussed if he has severe pain that does not go away he will need to go to the ED

## 2016-09-09 NOTE — Patient Instructions (Signed)
Medications reviewed and updated.  No changes recommended at this time.      Bee, Wasp, or [employer], wasps, and hornets are part of a family of insects that can sting people. These stings can cause pain and inflammation, but they are usually not serious. However, some people may have an allergic reaction to a sting. This can cause the symptoms to be more severe.  SYMPTOMS  Common symptoms of this condition include:   A red lump in the skin that sometimes has a tiny hole in the center. In some cases, a stinger may be in the center of the wound.  Pain and itching at the sting site.  Redness and swelling around the sting site. If you have an allergic reaction (localized allergic reaction), the swelling and redness may spread out from the sting site. In some cases, this reaction can continue to develop over the next 12-36 hours. In rare cases, a person may have a severe allergic reaction (anaphylactic reaction) to a sting. Symptoms of an anaphylactic reaction may include:   Wheezing or difficulty breathing.  Raised, itchy, red patches on the skin.  Nausea or vomiting.  Abdominal cramping.  Diarrhea.  Chest pain.  Fainting.  Redness of the face (flushing). DIAGNOSIS  This condition is usually diagnosed based on symptoms, medical history, and a physical exam. TREATMENT  Most stings can be treated with:   Icing to reduce swelling.  Medicines (antihistamines) to treat itching or an allergic reaction.  Medicines to help reduce pain. These may be medicines that you take by mouth, or medicated creams or lotions that you apply to your skin. If you were stung by a bee, the stinger and a small sac of poison may be in the wound. This may be removed by brushing across it with a flat card, such as a credit card. Another method is to pinch the area and pull it out. These methods can help reduce the severity of the body's reaction to the sting.  HOME CARE INSTRUCTIONS   Wash the  sting site daily with soap and water as told by your health care provider.  Apply or take over-the-counter and prescription medicines only as told by your health care provider.  If directed, apply ice to the sting area.  Put ice in a plastic bag.  Place a towel between your skin and the bag.  Leave the ice on for 20 minutes, 2-3 times per day.  Do not scratch the sting area.  To lessen pain, try using a paste that is made of water and baking soda. Rub the paste on the sting area and leave it on for 5 minutes.  If you had a severe allergic reaction to a sting, you may need:  To wear a medical bracelet or necklace that lists the allergy.  To learn when and how to use an anaphylaxis kit or epinephrine injection. Your family members may also need to learn this.  To carry an anaphylaxis kit with you at all times. SEEK MEDICAL CARE IF:   Your symptoms do not get better in 2-3 days.  You have redness, swelling, or pain that spreads beyond the area of the sting.  You have a fever. SEEK IMMEDIATE MEDICAL CARE IF:  You have symptoms of a severe allergic reaction. These include:   Wheezing or difficulty breathing.  Chest pain.  Light-headedness or fainting.  Itchy, raised, red patches on the skin.  Nausea or vomiting.  Abdominal cramping.  Diarrhea.  This information is not intended to replace advice given to you by your health care provider. Make sure you discuss any questions you have with your health care provider.   Document Released: 11/09/2005 Document Revised: 07/31/2015 Document Reviewed: 03/27/2015 Elsevier Interactive Patient Education [employer].

## 2016-09-09 NOTE — Assessment & Plan Note (Signed)
Occurred yesterday Has mild erythema and itch No evidence of infection now, but advised them to monitor for that closely and call if redness increases, pain increases or if he has a fever Apply ice advil or tylenol for pain Can use topical benadryl or cortisone cream

## 2016-09-09 NOTE — Progress Notes (Signed)
Pre visit review using our clinic review tool, if applicable. No additional management support is needed unless otherwise documented below in the visit note. 

## 2016-09-09 NOTE — Progress Notes (Signed)
Subjective:    Patient ID: Jack Noble, male    DOB: October 18, 1929, 82 y.o.   MRN: [id number]  HPI He is here for an acute visit.   Tender knot in right groin:  He noticed this 10 days ago.  It was a tender knot in his right groin.  It was intermittent for a couple of days.  Laying down helped.   He denies a knot or tenderness now. He has known bilateral inguinal hernia and he has had pain there in the past.  He has not had pain there recently.  He denies any recent pain from his ventral hernia.   Bee sting: He was stung yesterday on his right hand by a yellow jacket.   The area was tender last night and itched.  He denies any improvement since last night.  He denies numbness/tingling in the area.  He denies fever or swelling in the hand or arm.     Medications and allergies reviewed with patient and updated if appropriate.  Patient Active Problem List   Diagnosis Date Noted  . Ventral hernia without obstruction or gangrene 08/10/2016  . Foot pain, right 07/09/2016  . Abdominal pain 06/08/2016  . Epistaxis 06/08/2016  . Dental abscess 06/08/2016  . Weakness 05/21/2016  . Bilateral inguinal hernia 04/06/2016  . External hemorrhoid 02/25/2016  . Confusion 02/25/2016  . Poor balance 02/05/2016  . Right knee pain 02/05/2016  . Leg cramps 02/04/2016  . Essential hypertension, benign 12/26/2015  . Fatigue 09/19/2015  . GERD (gastroesophageal reflux disease) 06/06/2015  . Skin cancer 09/14/2014  . History of GI diverticular bleed 06/08/2014  . BRBPR (bright red blood per rectum) 06/01/2014  . Aphasia 12/26/2013  . History of short term memory loss 09/01/2013  . Hypoalbuminemia 08/04/2013  . Severe sepsis ([place]) 07/24/2013  . BPH (benign prostatic hyperplasia) 07/24/2013  . Other and unspecified hyperlipidemia 05/18/2013  . Glaucoma   . Incisional hernia, with obstruction, without gangrene 08/25/2010  . DIVERTICULOSIS, COLON 07/17/2009  . COLONIC POLYPS, HX OF 07/17/2009  .  ALLERGIC RHINITIS 10/16/2008  . LIPOMAS, MULTIPLE 11/04/2007  . Rosacea 05/12/2007    Current Outpatient Prescriptions on File Prior to Visit  Medication Sig Dispense Refill  . aspirin EC 81 MG tablet Take 81 mg by mouth daily.     . Cholecalciferol (VITAMIN D) 1000 UNITS capsule Take 1,000 Units by mouth every morning.     . Diclofenac Sodium 2 % SOLN Apply twice a day to top of right foot 2 g 0  . Elastic Bandages & Supports (ABDOMINAL BINDER/ELASTIC LARGE) MISC Large or appropriate size Large abdominal hernia 1 each 0  . finasteride (PROSCAR) 5 MG tablet Take 1 tablet (5 mg total) by mouth daily. 30 tablet 5  . finasteride (PROSCAR) 5 MG tablet TAKE 1 TABLET BY MOUTH EVERY DAY 30 tablet 5  . hydrocortisone (ANUSOL-HC) 2.5 % rectal cream Place 1 application rectally 2 (two) times daily. 30 g 0  . ketoconazole (NIZORAL) 2 % cream Apply 1 application topically 2 (two) times daily as needed for irritation. 30 g 0  . mometasone (ELOCON) 0.1 % ointment APPLY TOPICALLY TO AFFECTED AREA(S) TWICE DAILY (Patient taking differently: APPLY TOPICALLY TO AFFECTED AREA(S) TWICE DAILY PRN FOR IRRITATION) 45 g 0  . montelukast (SINGULAIR) 10 MG tablet Take 1 tablet (10 mg total) by mouth at bedtime. (Patient taking differently: Take 10 mg by mouth at bedtime as needed (allergies). ) 30 tablet 1  . Multiple  Vitamin (MULTIVITAMIN WITH MINERALS) TABS Take 1 tablet by mouth every morning.     . nystatin-triamcinolone (MYCOLOG II) cream Apply 1 application topically daily as needed (Applies to red places in groin area.). --- Please establish with new PCP for further refills 30 g 0  . simethicone (MYLICON) 80 MG chewable tablet Chew 1 tablet (80 mg total) by mouth every 6 (six) hours as needed for flatulence. 30 tablet 0  . Timolol Maleate (ISTALOL) 0.5 % (DAILY) SOLN Place 1 drop into the left eye 2 (two) times daily at 8 am and 10 pm.     . valsartan (DIOVAN) 40 MG tablet Take 1 tablet (40 mg total) by mouth  daily. 30 tablet 5   No current facility-administered medications on file prior to visit.     Past Medical History:  Diagnosis Date  . Allergy   . Aphasia 12/26/2013   Dr Jannifer Franklin  . Cataracts, bilateral   . Corneal abrasion    Dr. Ned Clines, [facility] Ophth  . Dermatophytosis of nail   . Diverticulosis   . Epistaxis   . Glaucoma    Dr. Edilia Bo  . Hearing loss   . History of GI diverticular bleed 710/15  . Hyperlipidemia   . Hyperplasia of prostate without urinary obstruction   . Rosacea    Dr. Marge Duncans  . Sepsis due to Escherichia coli (E. coli) ([place]) 05/2011   Morganella also cultured  . TIA (transient ischemic attack) 9/6-05/2012   BP 221/99    Past Surgical History:  Procedure Laterality Date  . APPENDECTOMY     age 62  . CATARACT EXTRACTION, BILATERAL  2003   Dr Ishmael Holter (now seen @ [place])  . CHOLECYSTECTOMY  1978   stones  . COLONOSCOPY W/ POLYPECTOMY  06/2009   Dr.Jacobs; "miniscule polyp"  . INGUINAL HERNIA REPAIR     age 63  . SKIN BIOPSY  12/2014   done by dermatologist   . TONSILLECTOMY AND ADENOIDECTOMY    . UPPER GI ENDOSCOPY  05/2014   negative    Social History   Social History  . Marital status: Married    Spouse name: N/A  . Number of children: 1  . Years of education: college   Occupational History  . [employer] Retired   Social History Main Topics  . Smoking status: Never Smoker  . Smokeless tobacco: Never Used  . Alcohol use No  . Drug use: No  . Sexual activity: Not on file   Other Topics Concern  . Not on file   Social History Narrative   Lives with his wife.      Family History  Problem Relation Age of Onset  . Stroke Mother 61  . Parkinsonism Brother     Brother died at age at age 16  . Pulmonary embolism Father 30    Post Op  . Diabetes Neg Hx   . Cancer Neg Hx   . COPD Neg Hx   . Heart disease Neg Hx     Review of Systems  Constitutional: Negative for chills and fever.  Gastrointestinal: Negative for  abdominal pain (no current pain), blood in stool, constipation and diarrhea.  Skin: Positive for color change (redness in area of bee sting). Negative for wound.  Neurological: Negative for light-headedness and headaches.       Objective:   Vitals:   09/09/16 1543  BP: (!) 144/72  Pulse: (!) 57  Resp: 16  Temp: 97.6 F (36.4 C)  Filed Weights   09/09/16 1543  Weight: 142 lb (64.4 kg)   Body mass index is 23.63 kg/m.   Physical Exam  Constitutional: He appears well-developed and well-nourished. No distress.  HENT:  Head: Normocephalic and atraumatic.  Abdominal: Soft. There is no tenderness. There is no rebound and no guarding.  Large ventral hernia that is unchanged - reducible, non-tender; right inguinal hernia that is palpable but non-tender, no palpable left inguinal hernia  Skin: He is not diaphoretic. There is erythema (right later wrist - size of a orange, no edema or wound, no tracking up arm, mild warm, non-tender, normal sensation).  Psychiatric: He has a normal mood and affect. His behavior is normal.        Assessment & Plan:   See Problem List for Assessment and Plan of chronic medical problems.

## 2016-09-23 ENCOUNTER — Ambulatory Visit: Payer: MEDICARE | Admitting: Internal Medicine

## 2016-09-24 ENCOUNTER — Encounter: Payer: Self-pay | Admitting: Family

## 2016-09-24 ENCOUNTER — Other Ambulatory Visit (INDEPENDENT_AMBULATORY_CARE_PROVIDER_SITE_OTHER): Payer: MEDICARE

## 2016-09-24 ENCOUNTER — Ambulatory Visit (INDEPENDENT_AMBULATORY_CARE_PROVIDER_SITE_OTHER): Payer: MEDICARE | Admitting: Family

## 2016-09-24 VITALS — BP 120/70 | HR 67 | Temp 98.1°F | Resp 14 | Ht 65.0 in | Wt 143.0 lb

## 2016-09-24 DIAGNOSIS — R410 Disorientation, unspecified: Secondary | ICD-10-CM

## 2016-09-24 DIAGNOSIS — R350 Frequency of micturition: Secondary | ICD-10-CM

## 2016-09-24 LAB — POCT URINALYSIS DIPSTICK
BILIRUBIN UA: NEGATIVE
Blood, UA: NEGATIVE
GLUCOSE UA: NEGATIVE
Ketones, UA: NEGATIVE
LEUKOCYTES UA: NEGATIVE
NITRITE UA: NEGATIVE
Protein, UA: NEGATIVE
Spec Grav, UA: 1.015
Urobilinogen, UA: NEGATIVE
pH, UA: 6

## 2016-09-24 LAB — CBC WITH DIFFERENTIAL/PLATELET
BASOS ABS: 0 10*3/uL (ref 0.0–0.1)
Basophils Relative: 0.3 % (ref 0.0–3.0)
EOS ABS: 0 10*3/uL (ref 0.0–0.7)
Eosinophils Relative: 0.6 % (ref 0.0–5.0)
HEMATOCRIT: 38.3 % — AB (ref 39.0–52.0)
HEMOGLOBIN: 13 g/dL (ref 13.0–17.0)
LYMPHS PCT: 10.5 % — AB (ref 12.0–46.0)
Lymphs Abs: 0.9 10*3/uL (ref 0.7–4.0)
MCHC: 33.9 g/dL (ref 30.0–36.0)
MCV: 96.3 fl (ref 78.0–100.0)
Monocytes Absolute: 0.8 10*3/uL (ref 0.1–1.0)
Monocytes Relative: 9.2 % (ref 3.0–12.0)
Neutro Abs: 6.6 10*3/uL (ref 1.4–7.7)
Neutrophils Relative %: 79.4 % — ABNORMAL HIGH (ref 43.0–77.0)
PLATELETS: 172 10*3/uL (ref 150.0–400.0)
RBC: 3.98 Mil/uL — ABNORMAL LOW (ref 4.22–5.81)
RDW: 13.2 % (ref 11.5–15.5)
WBC: 8.3 10*3/uL (ref 4.0–10.5)

## 2016-09-24 LAB — COMPREHENSIVE METABOLIC PANEL
ALBUMIN: 4 g/dL (ref 3.5–5.2)
ALK PHOS: 44 U/L (ref 39–117)
ALT: 15 U/L (ref 0–53)
AST: 20 U/L (ref 0–37)
BILIRUBIN TOTAL: 0.6 mg/dL (ref 0.2–1.2)
BUN: 9 mg/dL (ref 6–23)
CALCIUM: 9.3 mg/dL (ref 8.4–10.5)
CO2: 29 mEq/L (ref 19–32)
CREATININE: 0.71 mg/dL (ref 0.40–1.50)
Chloride: 101 mEq/L (ref 96–112)
GFR: 111.88 mL/min (ref >60.00)
Glucose, Bld: 100 mg/dL — ABNORMAL HIGH (ref 70–99)
Potassium: 3.9 mEq/L (ref 3.5–5.1)
Sodium: 136 mEq/L (ref 135–145)
Total Protein: 6.8 g/dL (ref 6.0–8.3)

## 2016-09-24 NOTE — Assessment & Plan Note (Signed)
In office UA negative. Concern for increasing BPH symptoms. Does not appear to be prostatitis. Continue to monitor pending urine culture results.

## 2016-09-24 NOTE — Patient Instructions (Addendum)
Thank you for choosing [facility].  SUMMARY AND INSTRUCTIONS:  Please continue to take your medications as prescribed.   We will call with the results of your blood tests.   It appears your urinary symptoms may be related to your prostate with no infection noted in your urine.   Continue to monitor for fevers or worsening symptoms.    Medication:  Your prescription(s) have been submitted to your pharmacy or been printed and provided for you. Please take as directed and contact our office if you believe you are having problem(s) with the medication(s) or have any questions.  Labs:  Please stop by the lab on the lower level of the building for your blood work. Your results will be released to [place] (or called to you) after review, usually within 72 hours after test completion. If any changes need to be made, you will be notified at that same time.  1.) The lab is open from 7:30am to 5:30 pm Monday-Friday 2.) No appointment is necessary 3.) Fasting (if needed) is 6-8 hours after food and drink; black coffee and water are okay   Follow up:  If your symptoms worsen or fail to improve, please contact our office for further instruction, or in case of emergency go directly to the emergency room at the closest medical facility.    Thank you for choosing [facility].  SUMMARY AND INSTRUCTIONS:  Medication:  Your prescription(s) have been submitted to your pharmacy or been printed and provided for you. Please take as directed and contact our office if you believe you are having problem(s) with the medication(s) or have any questions.  Labs:  Please stop by the lab on the lower level of the building for your blood work. Your results will be released to [place] (or called to you) after review, usually within 72 hours after test completion. If any changes need to be made, you will be notified at that same time.  1.) The lab is open from 7:30am to 5:30 pm  Monday-Friday 2.) No appointment is necessary 3.) Fasting (if needed) is 6-8 hours after food and drink; black coffee and water are okay   Imaging / Radiology:  Please stop by radiology on the basement level of the building for your x-rays. Your results will be released to [place] (or called to you) after review, usually within 72 hours after test completion. If any treatments or changes are necessary, you will be notified at that same time.  Referrals:  Referrals have been made during this visit. You should expect to hear back from our schedulers in about 7-10 days in regards to establishing an appointment with the specialists we discussed.   Follow up:  If your symptoms worsen or fail to improve, please contact our office for further instruction, or in case of emergency go directly to the emergency room at the closest medical facility.

## 2016-09-24 NOTE — Progress Notes (Signed)
Subjective:    Patient ID: Jack Noble, male    DOB: 09/10/1929, 82 y.o.   MRN: [id number]  Chief Complaint  Patient presents with  . Urinary Frequency    had some confusion last night, did not feel good yesterday, hands got really cold, urinary frequency x2 days    HPI:  Jack Noble is a 82 y.o. male who  has a past medical history of Allergy; Aphasia (12/26/2013); Cataracts, bilateral; Corneal abrasion; Dermatophytosis of nail; Diverticulosis; Epistaxis; Glaucoma; Hearing loss; History of GI diverticular bleed (710/15); Hyperlipidemia; Hyperplasia of prostate without urinary obstruction; Rosacea; Sepsis due to Escherichia coli (E. coli) ([place]) (05/2011); and TIA (transient ischemic attack) (9/6-05/2012). and presents today for an acute office visit.   This is a new problem. Associated symptoms of confusion, malaise and urinary frequency and urgency have been going on for about 2 days. Wife expresses that he does have some trouble starting his urine flow. No fevers. The confusion and the malaise are improved since initial onset. No abdominal or rectal pain. Does have significant history of BPH and maintained on finasteride. There are no modifying factors or attempted treatments.   Allergies  Allergen Reactions  . Amoxicillin     urticaria  . Codeine     Rash Because of a history of documented adverse serious drug reaction;Medi Alert bracelet  is recommended  . Minocycline Hcl     REACTION: nervous/chest pressure (Note: may have affected GE valve)  . Astelin [Azelastine Hcl]     Makes me jittery  . Azelastine Other (See Comments)  . Ether     pneumonia  . Zocor [Simvastatin] Other (See Comments)    Itching w/o, muscle problems and it affected his stomach  . Metoprolol     lightheaded      Outpatient Medications Prior to Visit  Medication Sig Dispense Refill  . aspirin EC 81 MG tablet Take 81 mg by mouth daily.     . Cholecalciferol (VITAMIN D) 1000 UNITS capsule  Take 1,000 Units by mouth every morning.     . Diclofenac Sodium 2 % SOLN Apply twice a day to top of right foot 2 g 0  . Elastic Bandages & Supports (ABDOMINAL BINDER/ELASTIC LARGE) MISC Large or appropriate size Large abdominal hernia 1 each 0  . finasteride (PROSCAR) 5 MG tablet Take 1 tablet (5 mg total) by mouth daily. 30 tablet 5  . finasteride (PROSCAR) 5 MG tablet TAKE 1 TABLET BY MOUTH EVERY DAY 30 tablet 5  . hydrocortisone (ANUSOL-HC) 2.5 % rectal cream Place 1 application rectally 2 (two) times daily. 30 g 0  . ketoconazole (NIZORAL) 2 % cream Apply 1 application topically 2 (two) times daily as needed for irritation. 30 g 0  . mometasone (ELOCON) 0.1 % ointment APPLY TOPICALLY TO AFFECTED AREA(S) TWICE DAILY (Patient taking differently: APPLY TOPICALLY TO AFFECTED AREA(S) TWICE DAILY PRN FOR IRRITATION) 45 g 0  . montelukast (SINGULAIR) 10 MG tablet Take 1 tablet (10 mg total) by mouth at bedtime. (Patient taking differently: Take 10 mg by mouth at bedtime as needed (allergies). ) 30 tablet 1  . Multiple Vitamin (MULTIVITAMIN WITH MINERALS) TABS Take 1 tablet by mouth every morning.     . nystatin-triamcinolone (MYCOLOG II) cream Apply 1 application topically daily as needed (Applies to red places in groin area.). --- Please establish with new PCP for further refills 30 g 0  . simethicone (MYLICON) 80 MG chewable tablet Chew 1 tablet (80 mg total)  by mouth every 6 (six) hours as needed for flatulence. 30 tablet 0  . Timolol Maleate (ISTALOL) 0.5 % (DAILY) SOLN Place 1 drop into the left eye 2 (two) times daily at 8 am and 10 pm.     . valsartan (DIOVAN) 40 MG tablet Take 1 tablet (40 mg total) by mouth daily. 30 tablet 5   No facility-administered medications prior to visit.      Past Medical History:  Diagnosis Date  . Allergy   . Aphasia 12/26/2013   Dr Jannifer Franklin  . Cataracts, bilateral   . Corneal abrasion    Dr. Ned Clines, [facility]  . Dermatophytosis of nail   .  Diverticulosis   . Epistaxis   . Glaucoma    Dr. Edilia Bo  . Hearing loss   . History of GI diverticular bleed 710/15  . Hyperlipidemia   . Hyperplasia of prostate without urinary obstruction   . Rosacea    Dr. Marge Duncans  . Sepsis due to Escherichia coli (E. coli) ([place]) 05/2011   Morganella also cultured  . TIA (transient ischemic attack) 9/6-05/2012   BP 221/99      Review of Systems  Constitutional: Negative for chills and fever.  Genitourinary: Positive for frequency and urgency. Negative for difficulty urinating, discharge, dysuria and hematuria.  Psychiatric/Behavioral: Positive for confusion (Transient and not currently).      Objective:    BP 120/70 (BP Location: Left Arm, Patient Position: Sitting, Cuff Size: Normal)   Pulse 67   Temp 98.1 F (36.7 C) (Oral)   Resp 14   Ht _0  (1.651 m)   Wt 143 lb (64.9 kg)   SpO2 97%   BMI 23.80 kg/m  Nursing note and vital signs reviewed.  Physical Exam  Constitutional: He is oriented to person, place, and time. He appears well-developed and well-nourished. No distress.  Cardiovascular: Normal rate, regular rhythm, normal heart sounds and intact distal pulses.   Pulmonary/Chest: Effort normal and breath sounds normal.  Abdominal: Normal appearance. There is no hepatosplenomegaly. There is no tenderness. There is no rigidity, no rebound, no guarding, no tenderness at McBurney's point and negative Murphy's sign.  Neurological: He is alert and oriented to person, place, and time.  Skin: Skin is warm and dry.  Psychiatric: He has a normal mood and affect. His behavior is normal. Judgment and thought content normal.       Assessment & Plan:   Problem List Items Addressed This Visit      Other   Transient confusion - Primary    Transient confusion and malaise resolved since initial onset and oriented at baseline. Neuro exam within normal limits. Physical exam with no evidence of infection. Obtain CBC and CMET. In office UA  negative for leukocytes, nitrites, and hematuria. Urine will be sent for culture. Continue to monitor pending blood work results.       Relevant Orders   Comp Met (CMET) (Completed)   CBC w/Diff (Completed)   Urinary frequency    In office UA negative. Concern for increasing BPH symptoms. Does not appear to be prostatitis. Continue to monitor pending urine culture results.       Relevant Orders   Comp Met (CMET) (Completed)   CBC w/Diff (Completed)   POCT urinalysis dipstick (Completed)   Urine culture    Other Visit Diagnoses   None.      I am having Mr. Decicco maintain his Timolol Maleate, Vitamin D, multivitamin with minerals, ketoconazole, simethicone, mometasone, montelukast, finasteride, nystatin-triamcinolone,  aspirin EC, hydrocortisone, valsartan, Abdominal Binder/Elastic Large, Diclofenac Sodium, and finasteride.  Follow-up: Return if symptoms worsen or fail to improve.  Mauricio Po, FNP

## 2016-09-24 NOTE — Assessment & Plan Note (Signed)
Transient confusion and malaise resolved since initial onset and oriented at baseline. Neuro exam within normal limits. Physical exam with no evidence of infection. Obtain CBC and CMET. In office UA negative for leukocytes, nitrites, and hematuria. Urine will be sent for culture. Continue to monitor pending blood work results.

## 2016-09-26 LAB — URINE CULTURE: ORGANISM ID, BACTERIA: NO GROWTH

## 2016-09-28 ENCOUNTER — Telehealth: Payer: Self-pay | Admitting: Internal Medicine

## 2016-09-28 NOTE — Telephone Encounter (Signed)
Patient Name: Jack Noble  DOB: February 11, 1929    Initial Comment Caller says, husband was not feeling well today, eyes and nose started draining, spit up a large amount of pink mucus. oral Temp 97.6 now.    Nurse Assessment  Nurse: Raphael Gibney, RN, Vera Date/Time (Eastern Time): 09/28/2016 1:32:58 PM  Confirm and document reason for call. If symptomatic, describe symptoms. You must click the next button to save text entered. ---Caller states spouse has not been feeling well today. has sinus congestion and having clear drainage from his eyes and nose. Has appt at 10:30 am. Has been coughing some. Spit up some pink tinged mucus. Temp 97.6 orally.  Has the patient traveled out of the country within the last 30 days? ---Not Applicable  Does the patient have any new or worsening symptoms? ---Yes  Will a triage be completed? ---Yes  Related visit to physician within the last 2 weeks? ---No  Does the PT have any chronic conditions? (i.e. diabetes, asthma, etc.) ---Yes  List chronic conditions. ---HTN  Is this a behavioral health or substance abuse call? ---No     Guidelines    Guideline Title Affirmed Question Affirmed Notes  Coughing Up Blood Coughing up blood (all other triage questions negative)    Final Disposition User   [place], RN, Vanita Ingles    Disagree/Comply: Comply

## 2016-09-29 ENCOUNTER — Encounter: Payer: Self-pay | Admitting: Family

## 2016-09-29 ENCOUNTER — Ambulatory Visit (INDEPENDENT_AMBULATORY_CARE_PROVIDER_SITE_OTHER): Payer: MEDICARE | Admitting: Family

## 2016-09-29 DIAGNOSIS — J069 Acute upper respiratory infection, unspecified: Secondary | ICD-10-CM

## 2016-09-29 MED ORDER — AZITHROMYCIN 250 MG PO TABS: ORAL_TABLET | ORAL | 0 refills | Status: DC

## 2016-09-29 NOTE — Patient Instructions (Signed)
Thank you for choosing [facility].  SUMMARY AND INSTRUCTIONS:  Medication:  Please continue to monitor your symptoms. If your symptoms worsen start he azithromycin.   Your prescription(s) have been submitted to your pharmacy or been printed and provided for you. Please take as directed and contact our office if you believe you are having problem(s) with the medication(s) or have any questions.   Follow up:  If your symptoms worsen or fail to improve, please contact our office for further instruction, or in case of emergency go directly to the emergency room at the closest medical facility.    General Recommendations:    Please drink plenty of fluids.  Get plenty of rest   Sleep in humidified air  Use saline nasal sprays  Netti pot   OTC Medications:  Decongestants - helps relieve congestion   Flonase (generic fluticasone) or Nasacort (generic triamcinolone) - please make sure to use the "cross-over" technique at a 45 degree angle towards the opposite eye as opposed to straight up the nasal passageway.   Sudafed (generic pseudoephedrine - Note this is the one that is available behind the pharmacy counter); Products with phenylephrine (-PE) may also be used but is often not as effective as pseudoephedrine.   If you have HIGH BLOOD PRESSURE - Coricidin HBP; AVOID any product that is -D as this contains pseudoephedrine which may increase your blood pressure.  Afrin (oxymetazoline) every 6-8 hours for up to 3 days.   Allergies - helps relieve runny nose, itchy eyes and sneezing   Claritin (generic loratidine), Allegra (fexofenidine), or Zyrtec (generic cyrterizine) for runny nose. These medications should not cause drowsiness.  Note - Benadryl (generic diphenhydramine) may be used however may cause drowsiness  Cough -   Delsym or Robitussin (generic dextromethorphan)  Expectorants - helps loosen mucus to ease removal   Mucinex (generic guaifenesin) as  directed on the package.  Headaches / General Aches   Tylenol (generic acetaminophen) - DO NOT EXCEED 3 grams (3,000 mg) in a 24 hour time period  Advil/Motrin (generic ibuprofen)   Sore Throat -   Salt water gargle   Chloraseptic (generic benzocaine) spray or lozenges / Sucrets (generic dyclonine)      Upper Respiratory Infection, Adult Most upper respiratory infections (URIs) are a viral infection of the air passages leading to the lungs. A URI affects the nose, throat, and upper air passages. The most common type of URI is nasopharyngitis and is typically referred to as "the common cold." URIs run their course and usually go away on their own. Most of the time, a URI does not require medical attention, but sometimes a bacterial infection in the upper airways can follow a viral infection. This is called a secondary infection. Sinus and middle ear infections are common types of secondary upper respiratory infections. Bacterial pneumonia can also complicate a URI. A URI can worsen asthma and chronic obstructive pulmonary disease (COPD). Sometimes, these complications can require emergency medical care and may be life threatening.  CAUSES Almost all URIs are caused by viruses. A virus is a type of germ and can spread from one person to another.  RISKS FACTORS You may be at risk for a URI if:   You smoke.   You have chronic heart or lung disease.  You have a weakened defense (immune) system.   You are very young or very old.   You have nasal allergies or asthma.  You work in crowded or poorly ventilated areas.  You work  in health care facilities or schools. SIGNS AND SYMPTOMS  Symptoms typically develop 2-3 days after you come in contact with a cold virus. Most viral URIs last 7-10 days. However, viral URIs from the influenza virus (flu virus) can last 14-18 days and are typically more severe. Symptoms may include:   Runny or stuffy (congested) nose.   Sneezing.    Cough.   Sore throat.   Headache.   Fatigue.   Fever.   Loss of appetite.   Pain in your forehead, behind your eyes, and over your cheekbones (sinus pain).  Muscle aches.  DIAGNOSIS  Your health care provider may diagnose a URI by:  Physical exam.  Tests to check that your symptoms are not due to another condition such as:  Strep throat.  Sinusitis.  Pneumonia.  Asthma. TREATMENT  A URI goes away on its own with time. It cannot be cured with medicines, but medicines may be prescribed or recommended to relieve symptoms. Medicines may help:  Reduce your fever.  Reduce your cough.  Relieve nasal congestion. HOME CARE INSTRUCTIONS   Take medicines only as directed by your health care provider.   Gargle warm saltwater or take cough drops to comfort your throat as directed by your health care provider.  Use a warm mist humidifier or inhale steam from a shower to increase air moisture. This may make it easier to breathe.  Drink enough fluid to keep your urine clear or pale yellow.   Eat soups and other clear broths and maintain good nutrition.   Rest as needed.   Return to work when your temperature has returned to normal or as your health care provider advises. You may need to stay home longer to avoid infecting others. You can also use a face mask and careful hand washing to prevent spread of the virus.  Increase the usage of your inhaler if you have asthma.   Do not use any tobacco products, including cigarettes, chewing tobacco, or electronic cigarettes. If you need help quitting, ask your health care provider. PREVENTION  The best way to protect yourself from getting a cold is to practice good hygiene.   Avoid oral or hand contact with people with cold symptoms.   Wash your hands often if contact occurs.  There is no clear evidence that vitamin C, vitamin E, echinacea, or exercise reduces the chance of developing a cold. However, it is  always recommended to get plenty of rest, exercise, and practice good nutrition.  SEEK MEDICAL CARE IF:   You are getting worse rather than better.   Your symptoms are not controlled by medicine.   You have chills.  You have worsening shortness of breath.  You have Mohamud or red mucus.  You have yellow or Mayden nasal discharge.  You have pain in your face, especially when you bend forward.  You have a fever.  You have swollen neck glands.  You have pain while swallowing.  You have white areas in the back of your throat. SEEK IMMEDIATE MEDICAL CARE IF:   You have severe or persistent:  Headache.  Ear pain.  Sinus pain.  Chest pain.  You have chronic lung disease and any of the following:  Wheezing.  Prolonged cough.  Coughing up blood.  A change in your usual mucus.  You have a stiff neck.  You have changes in your:  Vision.  Hearing.  Thinking.  Mood. MAKE SURE YOU:   Understand these instructions.  Will watch your condition.  Will get help right away if you are not doing well or get worse.   This information is not intended to replace advice given to you by your health care provider. Make sure you discuss any questions you have with your health care provider.   Document Released: 05/05/2001 Document Revised: 03/26/2015 Document Reviewed: 02/14/2014 Elsevier Interactive Patient Education [employer].

## 2016-09-29 NOTE — Assessment & Plan Note (Signed)
Symptoms and exam consistent with acute upper respiratory infection most likely viral. Treat conservatively with over-the-counter medications as needed for symptom relief and supportive care. Written prescription for azithromycin provided with instructions for watchful waiting if symptoms worsen or do not improve.

## 2016-09-29 NOTE — Progress Notes (Signed)
Subjective:    Patient ID: Jack Noble, male    DOB: 1929-02-25, 82 y.o.   MRN: [id number]  Chief Complaint  Patient presents with  . Nasal Congestion    started yesterday with some nasal drainage, coughed up some pinkish mucus and started blowing it out of his nose    HPI:  Jack Noble is a 82 y.o. male who  has a past medical history of Allergy; Aphasia (12/26/2013); Cataracts, bilateral; Corneal abrasion; Dermatophytosis of nail; Diverticulosis; Epistaxis; Glaucoma; Hearing loss; History of GI diverticular bleed (710/15); Hyperlipidemia; Hyperplasia of prostate without urinary obstruction; Rosacea; Sepsis due to Escherichia coli (E. coli) ([place]) (05/2011); and TIA (transient ischemic attack) (9/6-05/2012). and presents today for an acute office visit.  This is a new problem. Associated symptoms of congestion and clear nasal drainage has been going on for about 2 days. Has also had an episodes of pinkish discharge when blowing his nose. No fevers. Modifying factors include orange juice with no other over the counter medications. No recent antibiotics. Wife had a cold last week.    Allergies  Allergen Reactions  . Amoxicillin     urticaria  . Codeine     Rash Because of a history of documented adverse serious drug reaction;Medi Alert bracelet  is recommended  . Minocycline Hcl     REACTION: nervous/chest pressure (Note: may have affected GE valve)  . Astelin [Azelastine Hcl]     Makes me jittery  . Azelastine Other (See Comments)  . Ether     pneumonia  . Zocor [Simvastatin] Other (See Comments)    Itching w/o, muscle problems and it affected his stomach  . Metoprolol     lightheaded      Outpatient Medications Prior to Visit  Medication Sig Dispense Refill  . aspirin EC 81 MG tablet Take 81 mg by mouth daily.     . Cholecalciferol (VITAMIN D) 1000 UNITS capsule Take 1,000 Units by mouth every morning.     . Diclofenac Sodium 2 % SOLN Apply twice a day to top of  right foot 2 g 0  . Elastic Bandages & Supports (ABDOMINAL BINDER/ELASTIC LARGE) MISC Large or appropriate size Large abdominal hernia 1 each 0  . finasteride (PROSCAR) 5 MG tablet Take 1 tablet (5 mg total) by mouth daily. 30 tablet 5  . finasteride (PROSCAR) 5 MG tablet TAKE 1 TABLET BY MOUTH EVERY DAY 30 tablet 5  . hydrocortisone (ANUSOL-HC) 2.5 % rectal cream Place 1 application rectally 2 (two) times daily. 30 g 0  . ketoconazole (NIZORAL) 2 % cream Apply 1 application topically 2 (two) times daily as needed for irritation. 30 g 0  . mometasone (ELOCON) 0.1 % ointment APPLY TOPICALLY TO AFFECTED AREA(S) TWICE DAILY (Patient taking differently: APPLY TOPICALLY TO AFFECTED AREA(S) TWICE DAILY PRN FOR IRRITATION) 45 g 0  . montelukast (SINGULAIR) 10 MG tablet Take 1 tablet (10 mg total) by mouth at bedtime. (Patient taking differently: Take 10 mg by mouth at bedtime as needed (allergies). ) 30 tablet 1  . Multiple Vitamin (MULTIVITAMIN WITH MINERALS) TABS Take 1 tablet by mouth every morning.     . nystatin-triamcinolone (MYCOLOG II) cream Apply 1 application topically daily as needed (Applies to red places in groin area.). --- Please establish with new PCP for further refills 30 g 0  . simethicone (MYLICON) 80 MG chewable tablet Chew 1 tablet (80 mg total) by mouth every 6 (six) hours as needed for flatulence. 30 tablet 0  .  Timolol Maleate (ISTALOL) 0.5 % (DAILY) SOLN Place 1 drop into the left eye 2 (two) times daily at 8 am and 10 pm.     . valsartan (DIOVAN) 40 MG tablet Take 1 tablet (40 mg total) by mouth daily. 30 tablet 5   No facility-administered medications prior to visit.      Review of Systems  Constitutional: Negative for chills and fever.  HENT: Positive for congestion, postnasal drip and rhinorrhea. Negative for sinus pain, sinus pressure and sore throat.   Eyes:       Watery eyes  Respiratory: Positive for cough.   Neurological: Negative for headaches.      Objective:     BP 118/68 (BP Location: Left Arm, Patient Position: Sitting, Cuff Size: Normal)   Pulse 75   Temp 98.2 F (36.8 C) (Oral)   Resp 16   Ht 5\' 5"  (1.651 m)   Wt 140 lb (63.5 kg)   SpO2 96%   BMI 23.30 kg/m  Nursing note and vital signs reviewed.  Physical Exam  Constitutional: He is oriented to person, place, and time. He appears well-developed and well-nourished. No distress.  HENT:  Right Ear: Hearing, tympanic membrane, external ear and ear canal normal.  Left Ear: Hearing, tympanic membrane, external ear and ear canal normal.  Nose: Nose normal.  Mouth/Throat: Uvula is midline, oropharynx is clear and moist and mucous membranes are normal.  Neck: Neck supple.  Cardiovascular: Normal rate, regular rhythm, normal heart sounds and intact distal pulses.   Pulmonary/Chest: Effort normal and breath sounds normal.  Lymphadenopathy:    He has no cervical adenopathy.  Neurological: He is alert and oriented to person, place, and time.  Skin: Skin is warm and dry.  Psychiatric: He has a normal mood and affect. His behavior is normal. Judgment and thought content normal.       Assessment & Plan:   Problem List Items Addressed This Visit      Respiratory   Acute upper respiratory infection    Symptoms and exam consistent with acute upper respiratory infection most likely viral. Treat conservatively with over-the-counter medications as needed for symptom relief and supportive care. Written prescription for azithromycin provided with instructions for watchful waiting if symptoms worsen or do not improve.      Relevant Medications   azithromycin (ZITHROMAX) 250 MG tablet       I am having Jack Noble start on azithromycin. I am also having him maintain his Timolol Maleate, Vitamin D, multivitamin with minerals, ketoconazole, simethicone, mometasone, montelukast, finasteride, nystatin-triamcinolone, aspirin EC, hydrocortisone, valsartan, Abdominal Binder/Elastic Large, Diclofenac  Sodium, and finasteride.   Meds ordered this encounter  Medications  . azithromycin (ZITHROMAX) 250 MG tablet    Sig: Take 2 tablets by mouth for 1 day and then 1 tablet by mouth for 4 days.    Dispense:  6 tablet    Refill:  0    Order Specific Question:   Supervising Provider    Answer:   Pricilla Holm A [id number]     Follow-up: Return if symptoms worsen or fail to improve.  Mauricio Po, FNP

## 2016-10-02 ENCOUNTER — Telehealth: Payer: Self-pay | Admitting: *Deleted

## 2016-10-02 NOTE — Telephone Encounter (Signed)
Returned call to pt's wife. 1 yr f/u appt scheduled for 10/23/16.

## 2016-10-05 DIAGNOSIS — H04123 Dry eye syndrome of bilateral lacrimal glands: Secondary | ICD-10-CM | POA: Diagnosis not present

## 2016-10-05 DIAGNOSIS — H18453 Nodular corneal degeneration, bilateral: Secondary | ICD-10-CM | POA: Diagnosis not present

## 2016-10-05 DIAGNOSIS — H4052X1 Glaucoma secondary to other eye disorders, left eye, mild stage: Secondary | ICD-10-CM | POA: Diagnosis not present

## 2016-10-13 DIAGNOSIS — R040 Epistaxis: Secondary | ICD-10-CM | POA: Diagnosis not present

## 2016-10-23 ENCOUNTER — Encounter: Payer: Self-pay | Admitting: Neurology

## 2016-10-23 ENCOUNTER — Ambulatory Visit: Payer: MEDICARE | Admitting: Internal Medicine

## 2016-10-23 ENCOUNTER — Ambulatory Visit (INDEPENDENT_AMBULATORY_CARE_PROVIDER_SITE_OTHER): Payer: MEDICARE | Admitting: Neurology

## 2016-10-23 VITALS — BP 150/70 | HR 60 | Resp 18 | Ht 65.0 in | Wt 140.0 lb

## 2016-10-23 DIAGNOSIS — R4701 Aphasia: Secondary | ICD-10-CM

## 2016-10-23 NOTE — Progress Notes (Signed)
Reason for visit: Aphasia  Jack Noble is an 82 y.o. male  History of present illness:  Jack Noble is an 82 year old left-handed white male with a history of an aphasia syndrome that occurred after a febrile illness in 2014 with a fever of 105. The patient has never recovered his language function. The patient underwent speech therapy in the spring of 2017, it is not clear that he gained significant benefit with this. There has been some question whether or not the aphasia has been progressive or not, but according to the wife there has been very little change over the last one year. The patient has difficulty understanding language and with generating language. No other significant medical issues have come up since last seen. The patient has not had any difficulty with swallowing or choking.  Past Medical History:  Diagnosis Date  . Allergy   . Aphasia 12/26/2013   Dr Jannifer Franklin  . Cataracts, bilateral   . Corneal abrasion    Dr. Ned Clines, [facility]  . Dermatophytosis of nail   . Diverticulosis   . Epistaxis   . Glaucoma    Dr. Edilia Bo  . Hearing loss   . History of GI diverticular bleed 710/15  . Hyperlipidemia   . Hyperplasia of prostate without urinary obstruction   . Rosacea    Dr. Marge Duncans  . Sepsis due to Escherichia coli (E. coli) ([place]) 05/2011   Morganella also cultured  . TIA (transient ischemic attack) 9/6-05/2012   BP 221/99    Past Surgical History:  Procedure Laterality Date  . APPENDECTOMY     age 82  . CATARACT EXTRACTION, BILATERAL  2003   Dr Ishmael Holter (now seen @ [place])  . CHOLECYSTECTOMY  1978   stones  . COLONOSCOPY W/ POLYPECTOMY  06/2009   Dr.Jacobs; "miniscule polyp"  . INGUINAL HERNIA REPAIR     age 61  . SKIN BIOPSY  12/2014   done by dermatologist   . TONSILLECTOMY AND ADENOIDECTOMY    . UPPER GI ENDOSCOPY  05/2014   negative    Family History  Problem Relation Age of Onset  . Stroke Mother 18  . Parkinsonism Brother     Brother died  at age at age 71  . Pulmonary embolism Father 50    Post Op  . Diabetes Neg Hx   . Cancer Neg Hx   . COPD Neg Hx   . Heart disease Neg Hx     Social history:  reports that he has never smoked. He has never used smokeless tobacco. He reports that he does not drink alcohol or use drugs.    Allergies  Allergen Reactions  . Amoxicillin     urticaria  . Codeine     Rash Because of a history of documented adverse serious drug reaction;Medi Alert bracelet  is recommended  . Minocycline Hcl     REACTION: nervous/chest pressure (Note: may have affected GE valve)  . Astelin [Azelastine Hcl]     Makes me jittery  . Azelastine Other (See Comments)  . Ether     pneumonia  . Zocor [Simvastatin] Other (See Comments)    Itching w/o, muscle problems and it affected his stomach  . Metoprolol     lightheaded    Medications:  Prior to Admission medications   Medication Sig Start Date End Date Taking? Authorizing Provider  aspirin EC 81 MG tablet Take 81 mg by mouth daily.    Yes Historical Provider, MD  azithromycin (  ZITHROMAX) 250 MG tablet Take 2 tablets by mouth for 1 day and then 1 tablet by mouth for 4 days. 09/29/16  Yes Golden Circle, FNP  Cholecalciferol (VITAMIN D) 1000 UNITS capsule Take 1,000 Units by mouth every morning.    Yes Historical Provider, MD  Diclofenac Sodium 2 % SOLN Apply twice a day to top of right foot 08/10/16  Yes Binnie Rail, MD  Elastic Bandages & Supports (ABDOMINAL BINDER/ELASTIC LARGE) MISC Large or appropriate size Large abdominal hernia 08/10/16  Yes Binnie Rail, MD  finasteride (PROSCAR) 5 MG tablet Take 1 tablet (5 mg total) by mouth daily. 08/14/15  Yes Hendricks Limes, MD  finasteride (PROSCAR) 5 MG tablet TAKE 1 TABLET BY MOUTH EVERY DAY 08/20/16  Yes Binnie Rail, MD  hydrocortisone (ANUSOL-HC) 2.5 % rectal cream Place 1 application rectally 2 (two) times daily. 06/08/16  Yes Binnie Rail, MD  ketoconazole (NIZORAL) 2 % cream Apply 1 application  topically 2 (two) times daily as needed for irritation. 05/07/14  Yes Hendricks Limes, MD  mometasone (ELOCON) 0.1 % ointment APPLY TOPICALLY TO AFFECTED AREA(S) TWICE DAILY Patient taking differently: APPLY TOPICALLY TO AFFECTED AREA(S) TWICE DAILY PRN FOR IRRITATION 06/26/14  Yes Hendricks Limes, MD  montelukast (SINGULAIR) 10 MG tablet Take 1 tablet (10 mg total) by mouth at bedtime. Patient taking differently: Take 10 mg by mouth at bedtime as needed (allergies).  02/06/15  Yes Hendricks Limes, MD  Multiple Vitamin (MULTIVITAMIN WITH MINERALS) TABS Take 1 tablet by mouth every morning.    Yes Historical Provider, MD  nystatin-triamcinolone (MYCOLOG II) cream Apply 1 application topically daily as needed (Applies to red places in groin area.). --- Please establish with new PCP for further refills 11/22/15  Yes Hendricks Limes, MD  simethicone (MYLICON) 80 MG chewable tablet Chew 1 tablet (80 mg total) by mouth every 6 (six) hours as needed for flatulence. 06/03/14  Yes Kinnie Feil, MD  Timolol Maleate (ISTALOL) 0.5 % (DAILY) SOLN Place 1 drop into the left eye 2 (two) times daily at 8 am and 10 pm.    Yes Historical Provider, MD  valsartan (DIOVAN) 40 MG tablet Take 1 tablet (40 mg total) by mouth daily. 06/22/16  Yes Binnie Rail, MD    ROS:  Out of a complete 14 system review of symptoms, the patient complains only of the following symptoms, and all other reviewed systems are negative.  Speech difficulty  Blood pressure (!) 150/70, pulse 60, resp. rate 18, height 5\' 5"  (1.651 m), weight 140 lb (63.5 kg).  Physical Exam  General: The patient is alert and cooperative at the time of the examination.  Skin: No significant peripheral edema is noted.   Neurologic Exam  Mental status: The patient is alert and oriented x 3 at the time of the examination. The patient has apparent normal recent and remote memory, with an apparently normal attention span and concentration  ability.   Cranial nerves: Facial symmetry is present. Speech is nonfluent, aphasic. The patient has difficulty understanding pure verbal commands and difficulty with generating speech, significant word finding problems. Extraocular movements are full. Visual fields are full.  Motor: The patient has good strength in all 4 extremities.  Sensory examination: Soft touch sensation is symmetric on the face, arms, and legs.  Coordination: The patient has good finger-nose-finger and heel-to-shin bilaterally.  Gait and station: The patient has a normal gait. Tandem gait was not tested. Romberg is  negative. No drift is seen.  Reflexes: Deep tendon reflexes are symmetric.   CT head 11/04/15:  IMPRESSION:  This is an abnormal CT scan of the head without contrast showing the following: 1.   Mild to moderate cortical atrophy that has progressed since the CT scan dated 07/24/2013. 2.   Chronic microvascular ischemic changes essentially unchanged when compared to the CT scan dated 07/24/2013. 3.   No evidence of large vessel strokes 4.   There are no acute findings.  * CT scan images were reviewed online. I agree with the written report.    Assessment/Plan:  1. Aphasia  The patient appears to have a static issue with his speech. 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 At this point, the patient does not wish to reenter speech therapy. The patient will follow-up through this office on an as-needed basis.  Jill Alexanders MD 10/23/2016 12:58 PM  [facility] [address] [place], [place] [address]  Phone [phone] Fax [phone]

## 2016-11-09 ENCOUNTER — Encounter: Payer: Self-pay | Admitting: Internal Medicine

## 2016-11-09 ENCOUNTER — Ambulatory Visit (INDEPENDENT_AMBULATORY_CARE_PROVIDER_SITE_OTHER): Payer: MEDICARE | Admitting: Internal Medicine

## 2016-11-09 VITALS — BP 134/74 | HR 61 | Temp 97.8°F | Resp 16 | Wt 140.0 lb

## 2016-11-09 DIAGNOSIS — K5900 Constipation, unspecified: Secondary | ICD-10-CM

## 2016-11-09 DIAGNOSIS — K4021 Bilateral inguinal hernia, without obstruction or gangrene, recurrent: Secondary | ICD-10-CM

## 2016-11-09 NOTE — Progress Notes (Signed)
Subjective:    Patient ID: Jack Noble, male    DOB: 1929-06-28, 82 y.o.   MRN: [id number]  HPI He is here for an acute visit.   B/l inguinal hernia:  He has occasional right inguinal pain.  Recently he had pain that lasted two days.  He currently does not have pain.  He avoids lifting anything heavy.  If he gets discomfort he will lay down.    Constipation:  He takes one colace daily.  He drinks prune juice nightly.  He has occasional abdominal discomfort and gas pains.  He eats a well balance diet.    Medications and allergies reviewed with patient and updated if appropriate.  Patient Active Problem List   Diagnosis Date Noted  . Acute upper respiratory infection 09/29/2016  . Transient confusion 09/24/2016  . Urinary frequency 09/24/2016  . Bee sting 09/09/2016  . Ventral hernia without obstruction or gangrene 08/10/2016  . Foot pain, right 07/09/2016  . Abdominal pain 06/08/2016  . Epistaxis 06/08/2016  . Dental abscess 06/08/2016  . Weakness 05/21/2016  . Bilateral inguinal hernia 04/06/2016  . External hemorrhoid 02/25/2016  . Confusion 02/25/2016  . Poor balance 02/05/2016  . Right knee pain 02/05/2016  . Leg cramps 02/04/2016  . Essential hypertension, benign 12/26/2015  . Fatigue 09/19/2015  . GERD (gastroesophageal reflux disease) 06/06/2015  . Skin cancer 09/14/2014  . History of GI diverticular bleed 06/08/2014  . BRBPR (bright red blood per rectum) 06/01/2014  . Aphasia 12/26/2013  . History of short term memory loss 09/01/2013  . Hypoalbuminemia 08/04/2013  . Severe sepsis ([place]) 07/24/2013  . BPH (benign prostatic hyperplasia) 07/24/2013  . Other and unspecified hyperlipidemia 05/18/2013  . Glaucoma   . DIVERTICULOSIS, COLON 07/17/2009  . COLONIC POLYPS, HX OF 07/17/2009  . ALLERGIC RHINITIS 10/16/2008  . LIPOMAS, MULTIPLE 11/04/2007  . Rosacea 05/12/2007    Current Outpatient Prescriptions on File Prior to Visit  Medication Sig Dispense  Refill  . aspirin EC 81 MG tablet Take 81 mg by mouth daily.     . Cholecalciferol (VITAMIN D) 1000 UNITS capsule Take 1,000 Units by mouth every morning.     . Diclofenac Sodium 2 % SOLN Apply twice a day to top of right foot 2 g 0  . Elastic Bandages & Supports (ABDOMINAL BINDER/ELASTIC LARGE) MISC Large or appropriate size Large abdominal hernia 1 each 0  . finasteride (PROSCAR) 5 MG tablet Take 1 tablet (5 mg total) by mouth daily. 30 tablet 5  . finasteride (PROSCAR) 5 MG tablet TAKE 1 TABLET BY MOUTH EVERY DAY 30 tablet 5  . hydrocortisone (ANUSOL-HC) 2.5 % rectal cream Place 1 application rectally 2 (two) times daily. 30 g 0  . ketoconazole (NIZORAL) 2 % cream Apply 1 application topically 2 (two) times daily as needed for irritation. 30 g 0  . mometasone (ELOCON) 0.1 % ointment APPLY TOPICALLY TO AFFECTED AREA(S) TWICE DAILY (Patient taking differently: APPLY TOPICALLY TO AFFECTED AREA(S) TWICE DAILY PRN FOR IRRITATION) 45 g 0  . montelukast (SINGULAIR) 10 MG tablet Take 1 tablet (10 mg total) by mouth at bedtime. (Patient taking differently: Take 10 mg by mouth at bedtime as needed (allergies). ) 30 tablet 1  . Multiple Vitamin (MULTIVITAMIN WITH MINERALS) TABS Take 1 tablet by mouth every morning.     . nystatin-triamcinolone (MYCOLOG II) cream Apply 1 application topically daily as needed (Applies to red places in groin area.). --- Please establish with new PCP for further refills  30 g 0  . simethicone (MYLICON) 80 MG chewable tablet Chew 1 tablet (80 mg total) by mouth every 6 (six) hours as needed for flatulence. 30 tablet 0  . Timolol Maleate (ISTALOL) 0.5 % (DAILY) SOLN Place 1 drop into the left eye 2 (two) times daily at 8 am and 10 pm.     . valsartan (DIOVAN) 40 MG tablet Take 1 tablet (40 mg total) by mouth daily. 30 tablet 5   No current facility-administered medications on file prior to visit.     Past Medical History:  Diagnosis Date  . Allergy   . Aphasia 12/26/2013    Dr Jannifer Franklin  . Cataracts, bilateral   . Corneal abrasion    Dr. Ned Clines, [facility]  . Dermatophytosis of nail   . Diverticulosis   . Epistaxis   . Glaucoma    Dr. Edilia Bo  . Hearing loss   . History of GI diverticular bleed 710/15  . Hyperlipidemia   . Hyperplasia of prostate without urinary obstruction   . Rosacea    Dr. Marge Duncans  . Sepsis due to Escherichia coli (E. coli) ([place]) 05/2011   Morganella also cultured  . TIA (transient ischemic attack) 9/6-05/2012   BP 221/99    Past Surgical History:  Procedure Laterality Date  . APPENDECTOMY     age 44  . CATARACT EXTRACTION, BILATERAL  2003   Dr Ishmael Holter (now seen @ [place])  . CHOLECYSTECTOMY  1978   stones  . COLONOSCOPY W/ POLYPECTOMY  06/2009   Dr.Jacobs; "miniscule polyp"  . INGUINAL HERNIA REPAIR     age 37  . SKIN BIOPSY  12/2014   done by dermatologist   . TONSILLECTOMY AND ADENOIDECTOMY    . UPPER GI ENDOSCOPY  05/2014   negative    Social History   Social History  . Marital status: Married    Spouse name: N/A  . Number of children: 1  . Years of education: college   Occupational History  . [employer] Retired   Social History Main Topics  . Smoking status: Never Smoker  . Smokeless tobacco: Never Used  . Alcohol use No  . Drug use: No  . Sexual activity: Not on file   Other Topics Concern  . Not on file   Social History Narrative   Lives with his wife.      Family History  Problem Relation Age of Onset  . Stroke Mother 15  . Parkinsonism Brother     Brother died at age at age 71  . Pulmonary embolism Father 8    Post Op  . Diabetes Neg Hx   . Cancer Neg Hx   . COPD Neg Hx   . Heart disease Neg Hx     Review of Systems  Constitutional: Negative for fever.  Gastrointestinal: Positive for abdominal pain (none now, occ with constipation and hernia pain) and constipation. Negative for blood in stool and nausea.       Objective:   Vitals:   11/09/16 1338  BP: 134/74  Pulse:  61  Resp: 16  Temp: 97.8 F (36.6 C)   Filed Weights   11/09/16 1338  Weight: 140 lb (63.5 kg)   Body mass index is 23.3 kg/m.   Physical Exam  Constitutional: He appears well-developed and well-nourished. No distress.  Abdominal: Soft. He exhibits distension (large ventral hernia - reducible, nontender). There is no tenderness. There is no rebound and no guarding.  B/l inguinal hernia not palpable  Musculoskeletal: He exhibits no edema.  Skin: Skin is warm and dry. He is not diaphoretic.          Assessment & Plan:   See Problem List for Assessment and Plan of chronic medical problems.

## 2016-11-09 NOTE — Assessment & Plan Note (Signed)
Increased constipation Taking colace once daily Drinks prune juice daily Will increase colace to two tabs a day Increase fiber - consider adding metamucil to oatmeal miralax as needed

## 2016-11-09 NOTE — Assessment & Plan Note (Addendum)
Has intermittent discomfort - most recent episode lasted two days Typically lays down Avoids lifting Ideally avoid surgery, but will refer if pain increases Stressed to call or return if pain increases

## 2016-11-09 NOTE — Patient Instructions (Signed)
Medications reviewed.  Increase colace to two pills daily.    Try adding flax seed, metamucil fiber or Citrucel fiber to your oatmeal.   Drink plenty of water.    If you hernia pain increases please call or return.      Constipation, Adult Constipation is when a person has fewer bowel movements in a week than normal, has difficulty having a bowel movement, or has stools that are dry, hard, or larger than normal. Constipation may be caused by an underlying condition. It may become worse with age if a person takes certain medicines and does not take in enough fluids. Follow these instructions at home: Eating and drinking  Eat foods that have a lot of fiber, such as fresh fruits and vegetables, whole grains, and beans.  Limit foods that are high in fat, low in fiber, or overly processed, such as french fries, hamburgers, cookies, candies, and soda.  Drink enough fluid to keep your urine clear or pale yellow. General instructions  Exercise regularly or as told by your health care provider.  Go to the restroom when you have the urge to go. Do not hold it in.  Take over-the-counter and prescription medicines only as told by your health care provider. These include any fiber supplements.  Practice pelvic floor retraining exercises, such as deep breathing while relaxing the lower abdomen and pelvic floor relaxation during bowel movements.  Watch your condition for any changes.  Keep all follow-up visits as told by your health care provider. This is important. Contact a health care provider if:  You have pain that gets worse.  You have a fever.  You do not have a bowel movement after 4 days.  You vomit.  You are not hungry.  You lose weight.  You are bleeding from the anus.  You have thin, pencil-like stools. Get help right away if:  You have a fever and your symptoms suddenly get worse.  You leak stool or have blood in your stool.  Your abdomen is bloated.  You  have severe pain in your abdomen.  You feel dizzy or you faint. This information is not intended to replace advice given to you by your health care provider. Make sure you discuss any questions you have with your health care provider. Document Released: 08/07/2004 Document Revised: 05/29/2016 Document Reviewed: 04/29/2016 Elsevier Interactive Patient Education  2017 [employer].

## 2016-11-09 NOTE — Progress Notes (Signed)
Pre visit review using our clinic review tool, if applicable. No additional management support is needed unless otherwise documented below in the visit note. 

## 2016-11-24 ENCOUNTER — Ambulatory Visit (INDEPENDENT_AMBULATORY_CARE_PROVIDER_SITE_OTHER): Payer: MEDICARE | Admitting: Family

## 2016-11-24 ENCOUNTER — Encounter: Payer: Self-pay | Admitting: Family

## 2016-11-24 VITALS — BP 132/76 | HR 63 | Temp 97.8°F | Resp 16 | Ht 65.0 in | Wt 141.0 lb

## 2016-11-24 DIAGNOSIS — K439 Ventral hernia without obstruction or gangrene: Secondary | ICD-10-CM | POA: Diagnosis not present

## 2016-11-24 DIAGNOSIS — K5900 Constipation, unspecified: Secondary | ICD-10-CM

## 2016-11-24 DIAGNOSIS — S39012A Strain of muscle, fascia and tendon of lower back, initial encounter: Secondary | ICD-10-CM

## 2016-11-24 MED ORDER — POLYETHYLENE GLYCOL 3350 17 GM/SCOOP PO POWD: 17.0000 g | Freq: Every day | ORAL | 1 refills | Status: DC

## 2016-11-24 NOTE — Assessment & Plan Note (Signed)
Symptoms and exam consistent with low back strain. Treat conservatively with ice/moist heat, home exercise therapy and Tylenol as needed for discomfort. Follow up if symptoms worsen or do not improve.

## 2016-11-24 NOTE — Patient Instructions (Addendum)
Thank you for choosing [facility].  SUMMARY AND INSTRUCTIONS:  Ice / Moist heat x 20 minutes every 2 hours and after activity.   Stretches and exercises throughout the day.  Tylenol as needed for discomfort.   Icy hot or Aspircreme as needed.   If you wish to see surgery please let us know.   Medication:  Please continue with the Stool softener and start Miralax.   Your prescription(s) have been submitted to your pharmacy or been printed and provided for you. Please take as directed and contact our office if you believe you are having problem(s) with the medication(s) or have any questions.  Follow up:  If your symptoms worsen or fail to improve, please contact our office for further instruction, or in case of emergency go directly to the emergency room at the closest medical facility.    Constipation, Adult Constipation is when a person:  Poops (has a bowel movement) fewer times in a week than normal.  Has a hard time pooping.  Has poop that is dry, hard, or bigger than normal. Follow these instructions at home: Eating and drinking  Eat foods that have a lot of fiber, such as:  Fresh fruits and vegetables.  Whole grains.  Beans.  Eat less of foods that are high in fat, low in fiber, or overly processed, such as:  [place] fries.  Hamburgers.  Cookies.  Candy.  Soda.  Drink enough fluid to keep your pee (urine) clear or pale yellow. General instructions  Exercise regularly or as told by your doctor.  Go to the restroom when you feel like you need to poop. Do not hold it in.  Take over-the-counter and prescription medicines only as told by your doctor. These include any fiber supplements.  Do pelvic floor retraining exercises, such as:  Doing deep breathing while relaxing your lower belly (abdomen).  Relaxing your pelvic floor while pooping.  Watch your condition for any changes.  Keep all follow-up visits as told by your doctor. This is  important. Contact a doctor if:  You have pain that gets worse.  You have a fever.  You have not pooped for 4 days.  You throw up (vomit).  You are not hungry.  You lose weight.  You are bleeding from the anus.  You have thin, pencil-like poop (stool). Get help right away if:  You have a fever, and your symptoms suddenly get worse.  You leak poop or have blood in your poop.  Your belly feels hard or bigger than normal (is bloated).  You have very bad belly pain.  You feel dizzy or you faint. This information is not intended to replace advice given to you by your health care provider. Make sure you discuss any questions you have with your health care provider. Document Released: 04/27/2008 Document Revised: 05/29/2016 Document Reviewed: 04/29/2016 Elsevier Interactive Patient Education  2017 [employer].

## 2016-11-24 NOTE — Assessment & Plan Note (Signed)
Continues to experience constipation that is refractory to stool softeners and increased fiber intake. This is also likely contributing to his abdominal pain. No evidence of blockage with good bowel sounds. Continue current dosage of stool softener and start Miralax. Encouraged to continue physical activity. Continue to monitor.

## 2016-11-24 NOTE — Assessment & Plan Note (Signed)
Ventral hernia without gangrene or obstruction that is reducible and non-tender today. Discussed interventions including referral to general surgery for evaluation which patient declines at this time. This is a chronic problem.  Continue to monitor.

## 2016-11-24 NOTE — Progress Notes (Signed)
Subjective:    Patient ID: Jack Noble, male    DOB: 1929/03/15, 83 y.o.   MRN: [id number]  Chief Complaint  Patient presents with  . Back Pain    x1 week has had some abdominal pain thinks its from pulling a muscle and has been having back spasms, also has some Sholtz drainage from nose    HPI:  Jack Noble is a 83 y.o. male who  has a past medical history of Allergy; Aphasia (12/26/2013); Cataracts, bilateral; Corneal abrasion; Dermatophytosis of nail; Diverticulosis; Epistaxis; Glaucoma; Hearing loss; History of GI diverticular bleed (710/15); Hyperlipidemia; Hyperplasia of prostate without urinary obstruction; Rosacea; Sepsis due to Escherichia coli (E. coli) ([place]) (05/2011); and TIA (transient ischemic attack) (9/6-05/2012). and presents today for an office visit.  1.) Back pain - This is a new problem. Associated symptom of pain and muscle spasm located in his lower back has been going on for about 1 week. No trauma or injury that he can recall. Pain is described as achy. Denies any urinary symptoms. No saddle anesthesia or changes to bowel. Modifying factors include Tylenol and a heating pad which have helped with his symptoms. Symptoms generally wax and wane with good days and bad days. He is able to complete his activities of daily living. No radiculopathy or numbness/tingling. Previous lumbar x-rays from March 2017 showed minimal anterior slip of L5 with minimal L5-S1 disc space narrowing and mild L3-L4 and L4-L5 disc space narrowing.  2.) Abdominal pain - Associated symptom of pain located in his abdomen started after he was lifting his foot and experienced a pain around his ventral hernia located on the right side. Describes pain over night that gradually improved within a day or so. Continues to experience constipation. Reports taking the fiber as prescribed and notes increased gas production which causes discomfort and has a bowel frequency of about every 2 days. Denies any  blood in stool, nausea, or vomiting.   Allergies  Allergen Reactions  . Amoxicillin     urticaria  . Codeine     Rash Because of a history of documented adverse serious drug reaction;Medi Alert bracelet  is recommended  . Minocycline Hcl     REACTION: nervous/chest pressure (Note: may have affected GE valve)  . Astelin [Azelastine Hcl]     Makes me jittery  . Azelastine Other (See Comments)  . Ether     pneumonia  . Zocor [Simvastatin] Other (See Comments)    Itching w/o, muscle problems and it affected his stomach  . Metoprolol     lightheaded      Outpatient Medications Prior to Visit  Medication Sig Dispense Refill  . aspirin EC 81 MG tablet Take 81 mg by mouth daily.     . Cholecalciferol (VITAMIN D) 1000 UNITS capsule Take 1,000 Units by mouth every morning.     . Diclofenac Sodium 2 % SOLN Apply twice a day to top of right foot 2 g 0  . Elastic Bandages & Supports (ABDOMINAL BINDER/ELASTIC LARGE) MISC Large or appropriate size Large abdominal hernia 1 each 0  . finasteride (PROSCAR) 5 MG tablet Take 1 tablet (5 mg total) by mouth daily. 30 tablet 5  . finasteride (PROSCAR) 5 MG tablet TAKE 1 TABLET BY MOUTH EVERY DAY 30 tablet 5  . hydrocortisone (ANUSOL-HC) 2.5 % rectal cream Place 1 application rectally 2 (two) times daily. 30 g 0  . ketoconazole (NIZORAL) 2 % cream Apply 1 application topically 2 (two) times daily  as needed for irritation. 30 g 0  . mometasone (ELOCON) 0.1 % ointment APPLY TOPICALLY TO AFFECTED AREA(S) TWICE DAILY (Patient taking differently: APPLY TOPICALLY TO AFFECTED AREA(S) TWICE DAILY PRN FOR IRRITATION) 45 g 0  . montelukast (SINGULAIR) 10 MG tablet Take 1 tablet (10 mg total) by mouth at bedtime. (Patient taking differently: Take 10 mg by mouth at bedtime as needed (allergies). ) 30 tablet 1  . Multiple Vitamin (MULTIVITAMIN WITH MINERALS) TABS Take 1 tablet by mouth every morning.     . nystatin-triamcinolone (MYCOLOG II) cream Apply 1  application topically daily as needed (Applies to red places in groin area.). --- Please establish with new PCP for further refills 30 g 0  . simethicone (MYLICON) 80 MG chewable tablet Chew 1 tablet (80 mg total) by mouth every 6 (six) hours as needed for flatulence. 30 tablet 0  . Timolol Maleate (ISTALOL) 0.5 % (DAILY) SOLN Place 1 drop into the left eye 2 (two) times daily at 8 am and 10 pm.     . valsartan (DIOVAN) 40 MG tablet Take 1 tablet (40 mg total) by mouth daily. 30 tablet 5   No facility-administered medications prior to visit.       Past Surgical History:  Procedure Laterality Date  . APPENDECTOMY     age 26  . CATARACT EXTRACTION, BILATERAL  2003   Dr Ishmael Holter (now seen @ [facility])  . CHOLECYSTECTOMY  1978   stones  . COLONOSCOPY W/ POLYPECTOMY  06/2009   Dr.Jacobs; "miniscule polyp"  . INGUINAL HERNIA REPAIR     age 86  . SKIN BIOPSY  12/2014   done by dermatologist   . TONSILLECTOMY AND ADENOIDECTOMY    . UPPER GI ENDOSCOPY  05/2014   negative      Past Medical History:  Diagnosis Date  . Allergy   . Aphasia 12/26/2013   Dr Jannifer Franklin  . Cataracts, bilateral   . Corneal abrasion    Dr. Ned Clines, [facility]  . Dermatophytosis of nail   . Diverticulosis   . Epistaxis   . Glaucoma    Dr. Edilia Bo  . Hearing loss   . History of GI diverticular bleed 710/15  . Hyperlipidemia   . Hyperplasia of prostate without urinary obstruction   . Rosacea    Dr. Marge Duncans  . Sepsis due to Escherichia coli (E. coli) ([place]) 05/2011   Morganella also cultured  . TIA (transient ischemic attack) 9/6-05/2012   BP 221/99      Review of Systems  Constitutional: Negative for chills and fever.  Gastrointestinal: Positive for abdominal pain and constipation. Negative for diarrhea, nausea and vomiting.  Musculoskeletal: Positive for back pain.  Neurological: Negative for weakness and numbness.      Objective:    BP 132/76 (BP Location: Left Arm, Patient Position: Sitting, Cuff  Size: Normal)   Pulse 63   Temp 97.8 F (36.6 C) (Oral)   Resp 16   Ht 5\' 5"  (1.651 m)   Wt 141 lb (64 kg)   SpO2 97%   BMI 23.46 kg/m  Nursing note and vital signs reviewed.  Physical Exam  Constitutional: He is oriented to person, place, and time. He appears well-developed and well-nourished. No distress.  Cardiovascular: Normal rate, regular rhythm, normal heart sounds and intact distal pulses.   Pulmonary/Chest: Effort normal and breath sounds normal.  Abdominal: Bowel sounds are normal. There is no tenderness. There is no rigidity, no rebound, no guarding, no tenderness at McBurney's point  and negative Murphy's sign. A hernia is present. Hernia confirmed positive in the ventral area.  Musculoskeletal:  Low back - no obvious deformity, discoloration, or edema. There is mild tenderness of lumbar paraspinal musculature with no spasm, crepitus, or deformity. Range of motion is decreased in all directions with no discomfort. Distal pulses and sensation are intact and appropriate. Straight leg raises negative. Unable to complete Corky Sox secondary to decreased flexibility.  Neurological: He is alert and oriented to person, place, and time.  Skin: Skin is warm and dry.  Psychiatric: He has a normal mood and affect. His behavior is normal.  Patient has expressive aphasia and is assisted by his wife with communication.        Assessment & Plan:   Problem List Items Addressed This Visit      Digestive   Constipation    Continues to experience constipation that is refractory to stool softeners and increased fiber intake. This is also likely contributing to his abdominal pain. No evidence of blockage with good bowel sounds. Continue current dosage of stool softener and start Miralax. Encouraged to continue physical activity. Continue to monitor.       Relevant Medications   polyethylene glycol powder (GLYCOLAX/MIRALAX) powder     Musculoskeletal and Integument   Low back strain - Primary     Symptoms and exam consistent with low back strain. Treat conservatively with ice/moist heat, home exercise therapy and Tylenol as needed for discomfort. Follow up if symptoms worsen or do not improve.         Other   Ventral hernia without obstruction or gangrene    Ventral hernia without gangrene or obstruction that is reducible and non-tender today. Discussed interventions including referral to general surgery for evaluation which patient declines at this time. This is a chronic problem.  Continue to monitor.           I am having Mr. Dombeck start on polyethylene glycol powder. I am also having him maintain his Timolol Maleate, Vitamin D, multivitamin with minerals, ketoconazole, simethicone, mometasone, montelukast, finasteride, nystatin-triamcinolone, aspirin EC, hydrocortisone, valsartan, Abdominal Binder/Elastic Large, Diclofenac Sodium, and finasteride.   Meds ordered this encounter  Medications  . polyethylene glycol powder (GLYCOLAX/MIRALAX) powder    Sig: Take 17 g by mouth daily.    Dispense:  500 g    Refill:  1    Order Specific Question:   Supervising Provider    Answer:   Pricilla Holm A [id number]     Follow-up: Return if symptoms worsen or fail to improve.  Mauricio Po, FNP

## 2016-12-01 ENCOUNTER — Ambulatory Visit: Payer: MEDICARE | Admitting: Family Medicine

## 2016-12-14 ENCOUNTER — Other Ambulatory Visit: Payer: Self-pay | Admitting: Emergency Medicine

## 2016-12-14 MED ORDER — VALSARTAN 40 MG PO TABS: 40.0000 mg | ORAL_TABLET | Freq: Every day | ORAL | 5 refills | Status: DC

## 2016-12-30 ENCOUNTER — Encounter: Payer: Self-pay | Admitting: Gastroenterology

## 2017-01-05 ENCOUNTER — Telehealth: Payer: Self-pay | Admitting: Internal Medicine

## 2017-01-05 NOTE — Telephone Encounter (Signed)
Pt was not seen in the ED.

## 2017-01-05 NOTE — Telephone Encounter (Signed)
Patient Name: Moscow LUTH  DOB: 1929/10/27    Initial Comment caller states husband has a hernia on the right side in groin area and is in severe pain   Nurse Assessment  Nurse: Leilani Merl, RN, Nira Conn Date/Time (Eastern Time): 01/05/2017 11:16:26 AM  Confirm and document reason for call. If symptomatic, describe symptoms. ---caller states husband has a hernia on the right side in groin area and is in severe pain  Does the patient have any new or worsening symptoms? ---Yes  Will a triage be completed? ---Yes  Related visit to physician within the last 2 weeks? ---No  Does the PT have any chronic conditions? (i.e. diabetes, asthma, etc.) ---Yes  List chronic conditions. ---See MR  Is this a behavioral health or substance abuse call? ---No     Guidelines    Guideline Title Affirmed Question Affirmed Notes  Hernia Severe abdominal pain    Final Disposition User   Go to ED Now Standifer, RN, Heather    Referrals  GO TO FACILITY UNDECIDED   Disagree/Comply: Comply

## 2017-01-05 NOTE — Telephone Encounter (Signed)
Did he go to the ED?

## 2017-01-05 NOTE — Telephone Encounter (Signed)
Just follow up with them today or tomorrow and see if he is doing ok.

## 2017-01-07 NOTE — Telephone Encounter (Signed)
Spoke with pts wife on 01/07/16. States pt is feeling better and no longer in pain. Advised to make sure pt is resting and not doing nay heavy listing with house work and to keep a watch on the pain. Pts wife understood.

## 2017-01-18 DIAGNOSIS — Z961 Presence of intraocular lens: Secondary | ICD-10-CM | POA: Diagnosis not present

## 2017-01-18 DIAGNOSIS — H02055 Trichiasis without entropian left lower eyelid: Secondary | ICD-10-CM | POA: Diagnosis not present

## 2017-01-18 DIAGNOSIS — H409 Unspecified glaucoma: Secondary | ICD-10-CM | POA: Diagnosis not present

## 2017-01-18 DIAGNOSIS — H1859 Other hereditary corneal dystrophies: Secondary | ICD-10-CM | POA: Diagnosis not present

## 2017-01-18 DIAGNOSIS — H18453 Nodular corneal degeneration, bilateral: Secondary | ICD-10-CM | POA: Diagnosis not present

## 2017-01-29 ENCOUNTER — Ambulatory Visit: Payer: MEDICARE | Admitting: Gastroenterology

## 2017-02-04 ENCOUNTER — Encounter: Payer: Self-pay | Admitting: Adult Health

## 2017-02-04 ENCOUNTER — Ambulatory Visit (INDEPENDENT_AMBULATORY_CARE_PROVIDER_SITE_OTHER): Payer: MEDICARE | Admitting: Adult Health

## 2017-02-04 ENCOUNTER — Telehealth: Payer: Self-pay | Admitting: Internal Medicine

## 2017-02-04 VITALS — BP 130/74 | Temp 98.1°F | Ht 65.0 in | Wt 138.0 lb

## 2017-02-04 DIAGNOSIS — R5383 Other fatigue: Secondary | ICD-10-CM

## 2017-02-04 DIAGNOSIS — R531 Weakness: Secondary | ICD-10-CM | POA: Diagnosis not present

## 2017-02-04 NOTE — Telephone Encounter (Signed)
I can not see him until tomorrow and this ideally should not wait.  He could be having stroke like symptoms.

## 2017-02-04 NOTE — Telephone Encounter (Signed)
Pts wife called back wanting to come in today. There are no available appointments this afternoon. I offered another office but she said that they would not know his history and she did not want to take him there. We talked with Dr Jenny Reichmann and he said that based on what is going on it would be best for him to go to the emergency room to be seen. The pts wife said "so I am just going to go sit in the waiting room at the hospital with all that flu". I told her that even if they came here we would probably end of calling 911 for him to go to the hospital. She then said someone told her that they could not get in to see Dr Quay Burow for 3 months. I looked at her schedule and told her that the first appointment that we had was on April 3rd. She told me to put him in on that day. She also said that they have a friend that is a PA so she might check with them to see what to do.  The only contact in his chart is his wife.  Jack Noble

## 2017-02-04 NOTE — Telephone Encounter (Signed)
Spoke with pts wife. She verbalized how upset she was that she was never able to get an appt with Dr Quay Burow. I informed her that given the symptoms this is not something that should wait to be seen that she needed take him to the hospital due to evaluation that needed to be done. Advised pt that this was the first time that a message has been sent about these symptoms. Pts wife states she will call EMS.

## 2017-02-04 NOTE — Telephone Encounter (Signed)
Wife called in for patient stating that he has been having "spells" and wanted to make an appointment with Dr. Quay Burow for patient.  Wife states patient has moments where he will forget who she is and does not know where he is, he will get weak and have numbness in his hands.  Wife states this has been going on for three months. States she can not transport patient to hospital and does not want to call EMS b/c of billing.  Does not want to see any other providers.  Is requesting Dr. Quay Burow to work him in.

## 2017-02-04 NOTE — Progress Notes (Signed)
Subjective:    Patient ID: Jack Noble, male    DOB: 11/23/1929, 83 y.o.   MRN: [id number]  HPI  83 year old male who  has a past medical history of Allergy; Aphasia (12/26/2013); Cataracts, bilateral; Corneal abrasion; Dermatophytosis of nail; Diverticulosis; Epistaxis; Glaucoma; Hearing loss; History of GI diverticular bleed (710/15); Hyperlipidemia; Hyperplasia of prostate without urinary obstruction; Rosacea; Sepsis due to Escherichia coli (E. coli) ([place]) (05/2011); and TIA (transient ischemic attack) (9/6-05/2012). He is a patient of Dr. Quay Burow who I am seeing today for the first time today. He is with his wife who helps with the history.   She reports that today while at lunch he became weak and shaky. During this time he did not have any SOB, CP, or became diaphoretic. After lunch he went and rested in his chair until he felt better- this episode lasted about 2 hours. His wife denies seeing any facial droop or noticing any slurred speech after this incident.   Per his wife " Jack Noble has had these episodes on and off for the last five months."   Additionally, one week ago, Jack Noble was taking a nap and when he awoke he did not know where he was or who is wife was. This lasted a few minutes and then resolved.    Review of Systems Negative unless mentioned above   Past Medical History:  Diagnosis Date  . Allergy   . Aphasia 12/26/2013   Dr Jannifer Franklin  . Cataracts, bilateral   . Corneal abrasion    Dr. Ned Clines, [facility]  . Dermatophytosis of nail   . Diverticulosis   . Epistaxis   . Glaucoma    Dr. Edilia Bo  . Hearing loss   . History of GI diverticular bleed 710/15  . Hyperlipidemia   . Hyperplasia of prostate without urinary obstruction   . Rosacea    Dr. Marge Duncans  . Sepsis due to Escherichia coli (E. coli) ([place]) 05/2011   Morganella also cultured  . TIA (transient ischemic attack) 9/6-05/2012   BP 221/99    Social History   Social History  . Marital status: Married    Spouse  name: N/A  . Number of children: 1  . Years of education: college   Occupational History  . [employer] Retired   Social History Main Topics  . Smoking status: Never Smoker  . Smokeless tobacco: Never Used  . Alcohol use No  . Drug use: No  . Sexual activity: Not on file   Other Topics Concern  . Not on file   Social History Narrative   Lives with his wife.      Past Surgical History:  Procedure Laterality Date  . APPENDECTOMY     age 6  . CATARACT EXTRACTION, BILATERAL  2003   Dr Ishmael Holter (now seen @ [facility])  . CHOLECYSTECTOMY  1978   stones  . COLONOSCOPY W/ POLYPECTOMY  06/2009   Dr.Jacobs; "miniscule polyp"  . INGUINAL HERNIA REPAIR     age 70  . SKIN BIOPSY  12/2014   done by dermatologist   . TONSILLECTOMY AND ADENOIDECTOMY    . UPPER GI ENDOSCOPY  05/2014   negative    Family History  Problem Relation Age of Onset  . Stroke Mother 31  . Parkinsonism Brother     Brother died at age at age 63  . Pulmonary embolism Father 63    Post Op  . Diabetes Neg Hx   . Cancer Neg Hx   .  COPD Neg Hx   . Heart disease Neg Hx     Allergies  Allergen Reactions  . Amoxicillin     urticaria  . Codeine     Rash Because of a history of documented adverse serious drug reaction;Medi Alert bracelet  is recommended  . Minocycline Hcl     REACTION: nervous/chest pressure (Note: may have affected GE valve)  . Astelin [Azelastine Hcl]     Makes me jittery  . Azelastine Other (See Comments)  . Ether     pneumonia  . Zocor [Simvastatin] Other (See Comments)    Itching w/o, muscle problems and it affected his stomach  . Metoprolol     lightheaded    Current Outpatient Prescriptions on File Prior to Visit  Medication Sig Dispense Refill  . aspirin EC 81 MG tablet Take 81 mg by mouth daily.     . Cholecalciferol (VITAMIN D) 1000 UNITS capsule Take 1,000 Units by mouth every morning.     . Diclofenac Sodium 2 % SOLN Apply twice a day to top of right foot 2 g  0  . Elastic Bandages & Supports (ABDOMINAL BINDER/ELASTIC LARGE) MISC Large or appropriate size Large abdominal hernia 1 each 0  . finasteride (PROSCAR) 5 MG tablet Take 1 tablet (5 mg total) by mouth daily. 30 tablet 5  . finasteride (PROSCAR) 5 MG tablet TAKE 1 TABLET BY MOUTH EVERY DAY 30 tablet 5  . hydrocortisone (ANUSOL-HC) 2.5 % rectal cream Place 1 application rectally 2 (two) times daily. 30 g 0  . ketoconazole (NIZORAL) 2 % cream Apply 1 application topically 2 (two) times daily as needed for irritation. 30 g 0  . mometasone (ELOCON) 0.1 % ointment APPLY TOPICALLY TO AFFECTED AREA(S) TWICE DAILY (Patient taking differently: APPLY TOPICALLY TO AFFECTED AREA(S) TWICE DAILY PRN FOR IRRITATION) 45 g 0  . montelukast (SINGULAIR) 10 MG tablet Take 1 tablet (10 mg total) by mouth at bedtime. (Patient taking differently: Take 10 mg by mouth at bedtime as needed (allergies). ) 30 tablet 1  . Multiple Vitamin (MULTIVITAMIN WITH MINERALS) TABS Take 1 tablet by mouth every morning.     . nystatin-triamcinolone (MYCOLOG II) cream Apply 1 application topically daily as needed (Applies to red places in groin area.). --- Please establish with new PCP for further refills 30 g 0  . polyethylene glycol powder (GLYCOLAX/MIRALAX) powder Take 17 g by mouth daily. 500 g 1  . simethicone (MYLICON) 80 MG chewable tablet Chew 1 tablet (80 mg total) by mouth every 6 (six) hours as needed for flatulence. 30 tablet 0  . Timolol Maleate (ISTALOL) 0.5 % (DAILY) SOLN Place 1 drop into the left eye 2 (two) times daily at 8 am and 10 pm.     . valsartan (DIOVAN) 40 MG tablet Take 1 tablet (40 mg total) by mouth daily. 30 tablet 5   No current facility-administered medications on file prior to visit.     BP 130/74 (BP Location: Left Arm, Patient Position: Sitting, Cuff Size: Normal)   Temp 98.1 F (36.7 C) (Oral)   Ht '5\' 5"'  (1.651 m)   Wt 138 lb (62.6 kg)   BMI 22.96 kg/m       Objective:   Physical Exam    Constitutional: He is oriented to person, place, and time. He appears well-developed and well-nourished. No distress.  Cardiovascular: Normal rate, regular rhythm, normal heart sounds and intact distal pulses.  Exam reveals no gallop and no friction rub.   No  murmur heard. Pulmonary/Chest: Effort normal and breath sounds normal. No respiratory distress. He has no wheezes. He has no rales. He exhibits no tenderness.  Neurological: He is alert and oriented to person, place, and time.  Aphasia at baseline    Skin: Skin is warm and dry. No rash noted. He is not diaphoretic. No erythema. No pallor.  Psychiatric: He has a normal mood and affect. His behavior is normal. Judgment and thought content normal.  Nursing note and vitals reviewed.      Assessment & Plan:   1. Weakness - possibly hypoglycemia? No concern for CVA or TIA. Will check labs to rule out infection or UTI and consider imaging.  - Basic metabolic panel - CBC with Differential/Platelet - POCT Urinalysis Dipstick (Automated) - Go to the ER with any additional weakness or confusion episodes  [name], NP

## 2017-02-05 ENCOUNTER — Telehealth: Payer: Self-pay | Admitting: Internal Medicine

## 2017-02-05 LAB — CBC WITH DIFFERENTIAL/PLATELET
BASOS ABS: 0.1 10*3/uL (ref 0.0–0.1)
Basophils Relative: 0.9 % (ref 0.0–3.0)
EOS ABS: 0.3 10*3/uL (ref 0.0–0.7)
Eosinophils Relative: 3.6 % (ref 0.0–5.0)
HEMATOCRIT: 40.4 % (ref 39.0–52.0)
Hemoglobin: 13.7 g/dL (ref 13.0–17.0)
LYMPHS ABS: 1.7 10*3/uL (ref 0.7–4.0)
LYMPHS PCT: 23.8 % (ref 12.0–46.0)
MCHC: 33.9 g/dL (ref 30.0–36.0)
MCV: 98.3 fl (ref 78.0–100.0)
Monocytes Absolute: 0.4 10*3/uL (ref 0.1–1.0)
Monocytes Relative: 6 % (ref 3.0–12.0)
NEUTROS ABS: 4.7 10*3/uL (ref 1.4–7.7)
Neutrophils Relative %: 65.7 % (ref 43.0–77.0)
PLATELETS: 223 10*3/uL (ref 150.0–400.0)
RBC: 4.11 Mil/uL — ABNORMAL LOW (ref 4.22–5.81)
RDW: 13.1 % (ref 11.5–15.5)
WBC: 7.2 10*3/uL (ref 4.0–10.5)

## 2017-02-05 LAB — BASIC METABOLIC PANEL
BUN: 12 mg/dL (ref 6–23)
CALCIUM: 9.8 mg/dL (ref 8.4–10.5)
CHLORIDE: 102 meq/L (ref 96–112)
CO2: 30 meq/L (ref 19–32)
CREATININE: 0.62 mg/dL (ref 0.40–1.50)
GFR: 130.71 mL/min (ref >60.00)
GLUCOSE: 92 mg/dL (ref 70–99)
Potassium: 4.6 mEq/L (ref 3.5–5.1)
Sodium: 137 mEq/L (ref 135–145)

## 2017-02-05 LAB — POC URINALSYSI DIPSTICK (AUTOMATED)
BILIRUBIN UA: NEGATIVE
GLUCOSE UA: NEGATIVE
Ketones, UA: NEGATIVE
Leukocytes, UA: NEGATIVE
NITRITE UA: NEGATIVE
PH UA: 6 (ref 5.0–8.0)
Protein, UA: NEGATIVE
RBC UA: NEGATIVE
Spec Grav, UA: 1.015 (ref 1.030–1.035)
UROBILINOGEN UA: 0.2 (ref ?–2.0)

## 2017-02-05 NOTE — Telephone Encounter (Signed)
That is fine with me.

## 2017-02-05 NOTE — Telephone Encounter (Signed)
Please schedule establish care visit. Thanks!

## 2017-02-05 NOTE — Telephone Encounter (Signed)
Ok with me 

## 2017-02-05 NOTE — Telephone Encounter (Signed)
Pt would like to switch to cory nafziger. Can I sch?

## 2017-02-05 NOTE — Telephone Encounter (Signed)
Please advise 

## 2017-02-09 ENCOUNTER — Telehealth: Payer: Self-pay | Admitting: Neurology

## 2017-02-09 NOTE — Telephone Encounter (Signed)
Pt has been sch

## 2017-02-09 NOTE — Telephone Encounter (Signed)
I called the wife. The patient has a fairly significant level of aphasia, it is going to be difficult to fully assess his memory. However, the patient is having some problems with directions with driving which is new over the last 6 weeks or so, he may have some worsening of his aphasia, but his aphasia is already fairly severe.  I will get him worked in sometime in the next 3 weeks or so.

## 2017-02-09 NOTE — Telephone Encounter (Signed)
Patients wife called office in reference to patient needing to be seen with changes with memory and behavior.  Patient has been having trouble remembering things and getting upset over things easily for the past 4-6 weeks per wife.  Wife states patient is willing to get help.  Please call

## 2017-02-09 NOTE — Telephone Encounter (Signed)
Dr Jannifer Franklin- you last saw patient 10/23/16. Do you want to fit him into a work in slot or is he ok to see NP?

## 2017-02-10 NOTE — Telephone Encounter (Signed)
Called and spoke to pt wife. Scheduled f/u 03/01/17 at 12pm, check in 1130am. She verbalized understanding.

## 2017-02-17 DIAGNOSIS — H18453 Nodular corneal degeneration, bilateral: Secondary | ICD-10-CM | POA: Diagnosis not present

## 2017-02-17 DIAGNOSIS — H40051 Ocular hypertension, right eye: Secondary | ICD-10-CM | POA: Diagnosis not present

## 2017-02-17 DIAGNOSIS — H4052X1 Glaucoma secondary to other eye disorders, left eye, mild stage: Secondary | ICD-10-CM | POA: Diagnosis not present

## 2017-02-17 DIAGNOSIS — H04123 Dry eye syndrome of bilateral lacrimal glands: Secondary | ICD-10-CM | POA: Diagnosis not present

## 2017-02-23 ENCOUNTER — Ambulatory Visit: Payer: MEDICARE | Admitting: Internal Medicine

## 2017-02-25 ENCOUNTER — Telehealth: Payer: Self-pay | Admitting: Internal Medicine

## 2017-02-25 MED ORDER — FINASTERIDE 5 MG PO TABS: 5.0000 mg | ORAL_TABLET | Freq: Every day | ORAL | 0 refills | Status: DC

## 2017-02-25 NOTE — Telephone Encounter (Signed)
RX has been sent, Pt still plans to est with Tommi Rumps, NP

## 2017-02-25 NOTE — Telephone Encounter (Signed)
Pt needs a refill of finasteride (PROSCAR) 5 MG tablet, they have never been sen by Dr. Quay Burow but would like a refill put in if possible. They just transferred to her. Please advise.    [facility] [address] in [place].

## 2017-03-01 ENCOUNTER — Encounter: Payer: Self-pay | Admitting: Neurology

## 2017-03-01 ENCOUNTER — Ambulatory Visit (INDEPENDENT_AMBULATORY_CARE_PROVIDER_SITE_OTHER): Payer: MEDICARE | Admitting: Neurology

## 2017-03-01 VITALS — BP 145/67 | HR 57 | Ht 65.0 in | Wt 138.5 lb

## 2017-03-01 DIAGNOSIS — R4701 Aphasia: Secondary | ICD-10-CM | POA: Diagnosis not present

## 2017-03-01 NOTE — Progress Notes (Signed)
Reason for visit: Aphasia  Jack Noble is an 83 y.o. male  History of present illness:  Jack Noble is an 83 year old left-handed white male with a history of an aphasia following a febrile illness. The patient has had a static problem with aphasia, he underwent speech therapy about one year ago. The patient returns as his wife is concerned about his speech worsening, having more word finding problems. The patient sometimes will have problems with directions with driving which is new. The patient otherwise has a good energy level, and he sleeps well at night. The patient denies any balance issues or problems falling. The wife helps with the bills, the wife manages his medications and appointments. The patient returns for an evaluation.  Past Medical History:  Diagnosis Date  . Allergy   . Aphasia 12/26/2013   Dr Jannifer Franklin  . Cataracts, bilateral   . Corneal abrasion    Dr. Ned Clines, [facility] Op[facility]  . Dermatophytosis of nail   . Diverticulosis   . Epistaxis   . Glaucoma    Dr. Edilia Bo  . Hearing loss   . History of GI diverticular bleed 710/15  . Hyperlipidemia   . Hyperplasia of prostate without urinary obstruction   . Rosacea    Dr. Marge Duncans  . Sepsis due to Escherichia coli (E. coli) ([place]) 05/2011   Morganella also cultured  . TIA (transient ischemic attack) 9/6-05/2012   BP 221/99    Past Surgical History:  Procedure Laterality Date  . APPENDECTOMY     age 33  . CATARACT EXTRACTION, BILATERAL  2003   Dr Ishmael Holter (now seen @ [place])  . CHOLECYSTECTOMY  1978   stones  . COLONOSCOPY W/ POLYPECTOMY  06/2009   Dr.Jacobs; "miniscule polyp"  . INGUINAL HERNIA REPAIR     age 74  . SKIN BIOPSY  12/2014   done by dermatologist   . TONSILLECTOMY AND ADENOIDECTOMY    . UPPER GI ENDOSCOPY  05/2014   negative    Family History  Problem Relation Age of Onset  . Stroke Mother 62  . Parkinsonism Brother     Brother died at age at age 25  . Pulmonary embolism Father 26    Post  Op  . Diabetes Neg Hx   . Cancer Neg Hx   . COPD Neg Hx   . Heart disease Neg Hx     Social history:  reports that he has never smoked. He has never used smokeless tobacco. He reports that he does not drink alcohol or use drugs.    Allergies  Allergen Reactions  . Amoxicillin     urticaria  . Codeine     Rash Because of a history of documented adverse serious drug reaction;Medi Alert bracelet  is recommended  . Minocycline Hcl     REACTION: nervous/chest pressure (Note: may have affected GE valve)  . Astelin [Azelastine Hcl]     Makes me jittery  . Azelastine Other (See Comments)  . Ether     pneumonia  . Zocor [Simvastatin] Other (See Comments)    Itching w/o, muscle problems and it affected his stomach  . Metoprolol     lightheaded    Medications:  Prior to Admission medications   Medication Sig Start Date End Date Taking? Authorizing Provider  aspirin EC 81 MG tablet Take 81 mg by mouth daily.    Yes Historical Provider, MD  Cholecalciferol (VITAMIN D) 1000 UNITS capsule Take 1,000 Units by mouth every morning.  Yes Historical Provider, MD  Elastic Bandages & Supports (ABDOMINAL BINDER/ELASTIC LARGE) MISC Large or appropriate size Large abdominal hernia 08/10/16  Yes Binnie Rail, MD  finasteride (PROSCAR) 5 MG tablet Take 1 tablet (5 mg total) by mouth daily. 02/25/17  Yes Binnie Rail, MD  montelukast (SINGULAIR) 10 MG tablet Take 1 tablet (10 mg total) by mouth at bedtime. Patient taking differently: Take 10 mg by mouth at bedtime as needed (allergies).  02/06/15  Yes Hendricks Limes, MD  Multiple Vitamin (MULTIVITAMIN WITH MINERALS) TABS Take 1 tablet by mouth every morning.    Yes Historical Provider, MD  nystatin-triamcinolone (MYCOLOG II) cream Apply 1 application topically daily as needed (Applies to red places in groin area.). --- Please establish with new PCP for further refills 11/22/15  Yes Hendricks Limes, MD  simethicone (MYLICON) 80 MG chewable tablet  Chew 1 tablet (80 mg total) by mouth every 6 (six) hours as needed for flatulence. 06/03/14  Yes Kinnie Feil, MD  Timolol Maleate (ISTALOL) 0.5 % (DAILY) SOLN Place 1 drop into the left eye 2 (two) times daily at 8 am and 10 pm.    Yes Historical Provider, MD  valsartan (DIOVAN) 40 MG tablet Take 1 tablet (40 mg total) by mouth daily. 12/14/16  Yes Binnie Rail, MD    ROS:  Out of a complete 14 system review of symptoms, the patient complains only of the following symptoms, and all other reviewed systems are negative.  Memory loss, speech difficulty  Blood pressure (!) 145/67, pulse (!) 57, height 5\' 5"  (1.651 m), weight 138 lb 8 oz (62.8 kg).  Physical Exam  General: The patient is alert and cooperative at the time of the examination.  Skin: No significant peripheral edema is noted.   Neurologic Exam  Mental status: The patient is alert and oriented x 1 at the time of the examination (oriented only to person). The Mini-Mental status examination done today shows a total score of 5/30.   Cranial nerves: Facial symmetry is present. Speech is aphasic, severe word finding problems are noted. Extraocular movements are full. Visual fields are full.  Motor: The patient has good strength in all 4 extremities.  Sensory examination: Soft touch sensation is symmetric on the face, arms, and legs.  Coordination: The patient has good finger-nose-finger and heel-to-shin bilaterally.  Gait and station: The patient has a normal gait. Tandem gait is normal. Romberg is negative. No drift is seen.  Reflexes: Deep tendon reflexes are symmetric.   Assessment/Plan:  1. Aphasia  The patient clinically appears to be at his usual baseline. It is not clear at all that he is having a progressive problem with memory. The patient may have good days and bad days with the speech. He will follow-up in one year, sooner if needed. We discussed possibly starting on medications for memory, we have decided  not to initiate this therapy at this time. The patient scores poorly on the Mini-Mental status examination, primarily secondary to aphasia.   Jill Alexanders MD 03/01/2017 12:46 PM  [facility] [address] [place], [place] [address]  Phone [phone] Fax [phone]

## 2017-03-04 ENCOUNTER — Encounter: Payer: Self-pay | Admitting: Adult Health

## 2017-03-04 ENCOUNTER — Ambulatory Visit (INDEPENDENT_AMBULATORY_CARE_PROVIDER_SITE_OTHER): Payer: MEDICARE | Admitting: Adult Health

## 2017-03-04 VITALS — Ht 65.0 in | Wt 140.0 lb

## 2017-03-04 DIAGNOSIS — I10 Essential (primary) hypertension: Secondary | ICD-10-CM | POA: Diagnosis not present

## 2017-03-04 DIAGNOSIS — K5900 Constipation, unspecified: Secondary | ICD-10-CM

## 2017-03-04 DIAGNOSIS — Z7689 Persons encountering health services in other specified circumstances: Secondary | ICD-10-CM | POA: Diagnosis not present

## 2017-03-04 DIAGNOSIS — R4701 Aphasia: Secondary | ICD-10-CM | POA: Diagnosis not present

## 2017-03-04 MED ORDER — FINASTERIDE 5 MG PO TABS: 5.0000 mg | ORAL_TABLET | Freq: Every day | ORAL | 3 refills | Status: DC

## 2017-03-04 NOTE — Patient Instructions (Signed)
It was great seeing you today!   Try using a pro biotic for the constipation you are experiencing

## 2017-03-04 NOTE — Progress Notes (Signed)
Patient presents to clinic today to establish care. He is a pleasant 83 year old male who  has a past medical history of Allergy; Aphasia (12/26/2013); Cataracts, bilateral; Corneal abrasion; Dermatophytosis of nail; Diverticulosis; Epistaxis; Glaucoma; Hearing loss; History of GI diverticular bleed (710/15); Hyperlipidemia; Hyperplasia of prostate without urinary obstruction; Rosacea; Sepsis due to Escherichia coli (E. coli) ([place]) (05/2011); and TIA (transient ischemic attack) (9/6-05/2012).  He is a former patient of Dr. Quay Burow   Acute Concerns: Establish Care   Chronic Issues: Essential Hypertension - takes valsartan 40 mg daily   Aphasia - following febrile illness. Is followed by Dr. Jannifer Franklin and was last seen earlier in the week. His wife is concerned that the aphasia may be worsening. They report word finding issues. Also feel as though his memory is declining. He is having issues directions while driving. His wife is with him whenever they drive anywhere.   Constipation - Has been an ongoing issues for many years. Currently he is using stool softener and eating prunes. He has good days and bad days when it comes to the constipation. Denies any blood in stool but does report history of hemorrhoids.   Health Maintenance: Dental -- Routine Care  Vision -- Routine Care  Immunizations -- UTD  Colonoscopy -- No longer needs    Is followed by   Opthamology and Optometry at [facility]  Neurology - Dr. Jannifer Franklin - Seen for aphasia - followed annually  Dermatology    Past Medical History:  Diagnosis Date  . Allergy   . Aphasia 12/26/2013   Dr Jannifer Franklin  . Cataracts, bilateral   . Corneal abrasion    Dr. Ned Clines, [facility] Op[facility]  . Dermatophytosis of nail   . Diverticulosis   . Epistaxis   . Glaucoma    Dr. Edilia Bo  . Hearing loss   . History of GI diverticular bleed 710/15  . Hyperlipidemia   . Hyperplasia of prostate without urinary obstruction   . Rosacea    Dr. Marge Duncans  . Sepsis due  to Escherichia coli (E. coli) ([place]) 05/2011   Morganella also cultured  . TIA (transient ischemic attack) 9/6-05/2012   BP 221/99    Past Surgical History:  Procedure Laterality Date  . APPENDECTOMY     age 64  . CATARACT EXTRACTION, BILATERAL  2003   Dr Ishmael Holter (now seen @ [place])  . CHOLECYSTECTOMY  1978   stones  . COLONOSCOPY W/ POLYPECTOMY  06/2009   Dr.Jacobs; "miniscule polyp"  . INGUINAL HERNIA REPAIR     age 42  . SKIN BIOPSY  12/2014   done by dermatologist   . TONSILLECTOMY AND ADENOIDECTOMY    . UPPER GI ENDOSCOPY  05/2014   negative    Current Outpatient Prescriptions on File Prior to Visit  Medication Sig Dispense Refill  . aspirin EC 81 MG tablet Take 81 mg by mouth daily.     . Cholecalciferol (VITAMIN D) 1000 UNITS capsule Take 1,000 Units by mouth every morning.     Water engineer Bandages & Supports (ABDOMINAL BINDER/ELASTIC LARGE) MISC Large or appropriate size Large abdominal hernia 1 each 0  . finasteride (PROSCAR) 5 MG tablet Take 1 tablet (5 mg total) by mouth daily. 30 tablet 0  . montelukast (SINGULAIR) 10 MG tablet Take 1 tablet (10 mg total) by mouth at bedtime. (Patient taking differently: Take 10 mg by mouth at bedtime as needed (allergies). ) 30 tablet 1  . Multiple Vitamin (MULTIVITAMIN WITH MINERALS) TABS Take  1 tablet by mouth every morning.     . nystatin-triamcinolone (MYCOLOG II) cream Apply 1 application topically daily as needed (Applies to red places in groin area.). --- Please establish with new PCP for further refills 30 g 0  . simethicone (MYLICON) 80 MG chewable tablet Chew 1 tablet (80 mg total) by mouth every 6 (six) hours as needed for flatulence. 30 tablet 0  . Timolol Maleate (ISTALOL) 0.5 % (DAILY) SOLN Place 1 drop into the left eye 2 (two) times daily at 8 am and 10 pm.     . valsartan (DIOVAN) 40 MG tablet Take 1 tablet (40 mg total) by mouth daily. 30 tablet 5   No current facility-administered medications on file prior to visit.       Allergies  Allergen Reactions  . Amoxicillin     urticaria  . Codeine     Rash Because of a history of documented adverse serious drug reaction;Medi Alert bracelet  is recommended  . Minocycline Hcl     REACTION: nervous/chest pressure (Note: may have affected GE valve)  . Astelin [Azelastine Hcl]     Makes me jittery  . Azelastine Other (See Comments)  . Ether     pneumonia  . Zocor [Simvastatin] Other (See Comments)    Itching w/o, muscle problems and it affected his stomach  . Metoprolol     lightheaded    Family History  Problem Relation Age of Onset  . Stroke Mother 69  . Parkinsonism Brother     Brother died at age at age 75  . Pulmonary embolism Father 64    Post Op  . Diabetes Neg Hx   . Cancer Neg Hx   . COPD Neg Hx   . Heart disease Neg Hx     Social History   Social History  . Marital status: Married    Spouse name: N/A  . Number of children: 1  . Years of education: college   Occupational History  . [employer] Retired   Social History Main Topics  . Smoking status: Never Smoker  . Smokeless tobacco: Never Used  . Alcohol use No  . Drug use: No  . Sexual activity: Not on file   Other Topics Concern  . Not on file   Social History Narrative   Lives with his wife.     Was in the [employer] for 15 years   Left handed    Review of Systems  Constitutional: Negative.   HENT: Positive for hearing loss.   Eyes: Negative.   Respiratory: Negative.   Cardiovascular: Negative.   Gastrointestinal: Positive for constipation.  Genitourinary: Negative.   Musculoskeletal: Positive for back pain.  Skin: Negative.   Neurological: Positive for speech change.  Psychiatric/Behavioral: Negative.   All other systems reviewed and are negative.   Ht '5\' 5"'  (1.651 m)   Wt 140 lb (63.5 kg)   BMI 23.30 kg/m   Physical Exam  Constitutional: He is oriented to person, place, and time and well-developed, well-nourished, and in no distress. No  distress.  Cardiovascular: Normal rate, regular rhythm, normal heart sounds and intact distal pulses.  Exam reveals no gallop and no friction rub.   No murmur heard. Pulmonary/Chest: Effort normal and breath sounds normal. No respiratory distress. He has no wheezes. He has no rales. He exhibits no tenderness.  Abdominal: Soft. Bowel sounds are normal. He exhibits no mass. There is no tenderness. There is no rebound and no guarding. A hernia  is present. Hernia confirmed positive in the ventral area.  Musculoskeletal: Normal range of motion. He exhibits no edema, tenderness or deformity.  Neurological: He is alert and oriented to person, place, and time. He has normal motor skills. He displays abnormal speech. Gait normal. GCS score is 15.  Skin: Skin is warm and dry. No rash noted. He is not diaphoretic. No erythema. No pallor.  Psychiatric: Mood, memory, affect and judgment normal.  Nursing note and vitals reviewed.   Recent Results (from the past 2160 hour(s))  Basic metabolic panel     Status: None   Collection Time: 02/04/17  4:06 PM  Result Value Ref Range   Sodium 137 135 - 145 mEq/L   Potassium 4.6 3.5 - 5.1 mEq/L   Chloride 102 96 - 112 mEq/L   CO2 30 19 - 32 mEq/L   Glucose, Bld 92 70 - 99 mg/dL   BUN 12 6 - 23 mg/dL   Creatinine, Ser 0.62 0.40 - 1.50 mg/dL   Calcium 9.8 8.4 - 10.5 mg/dL   GFR 130.71 >60.00 mL/min  CBC with Differential/Platelet     Status: Abnormal   Collection Time: 02/04/17  4:06 PM  Result Value Ref Range   WBC 7.2 4.0 - 10.5 K/uL   RBC 4.11 (L) 4.22 - 5.81 Mil/uL   Hemoglobin 13.7 13.0 - 17.0 g/dL   HCT 40.4 39.0 - 52.0 %   MCV 98.3 78.0 - 100.0 fl   MCHC 33.9 30.0 - 36.0 g/dL   RDW 13.1 11.5 - 15.5 %   Platelets 223.0 150.0 - 400.0 K/uL   Neutrophils Relative % 65.7 43.0 - 77.0 %   Lymphocytes Relative 23.8 12.0 - 46.0 %   Monocytes Relative 6.0 3.0 - 12.0 %   Eosinophils Relative 3.6 0.0 - 5.0 %   Basophils Relative 0.9 0.0 - 3.0 %   Neutro Abs  4.7 1.4 - 7.7 K/uL   Lymphs Abs 1.7 0.7 - 4.0 K/uL   Monocytes Absolute 0.4 0.1 - 1.0 K/uL   Eosinophils Absolute 0.3 0.0 - 0.7 K/uL   Basophils Absolute 0.1 0.0 - 0.1 K/uL  POCT Urinalysis Dipstick (Automated)     Status: None   Collection Time: 02/05/17 12:53 PM  Result Value Ref Range   Color, UA yellow    Clarity, UA clear    Glucose, UA n    Bilirubin, UA n    Ketones, UA n    Spec Grav, UA 1.015 1.030 - 1.035   Blood, UA n    pH, UA 6.0 5.0 - 8.0   Protein, UA n    Urobilinogen, UA 0.2 Negative - 2.0   Nitrite, UA n    Leukocytes, UA Negative Negative    Assessment/Plan: 1. Encounter to establish care - Follow up in November for annual exam - Stay active as possible   2. Essential hypertension, benign - Controlled - No change   3. Constipation, unspecified constipation type - Increase foods high in fiber.  - Can add Probiotic - Drink plenty of water  4. Aphasia - Continue with follow up with Neurology     [name], NP

## 2017-03-05 ENCOUNTER — Encounter: Payer: Self-pay | Admitting: Adult Health

## 2017-03-30 ENCOUNTER — Encounter: Payer: Self-pay | Admitting: Adult Health

## 2017-03-30 ENCOUNTER — Ambulatory Visit (INDEPENDENT_AMBULATORY_CARE_PROVIDER_SITE_OTHER): Payer: MEDICARE | Admitting: Adult Health

## 2017-03-30 VITALS — BP 132/76 | Ht 65.0 in | Wt 139.3 lb

## 2017-03-30 DIAGNOSIS — B356 Tinea cruris: Secondary | ICD-10-CM

## 2017-03-30 MED ORDER — NYSTATIN 100000 UNIT/GM EX POWD: Freq: Three times a day (TID) | CUTANEOUS | 2 refills | Status: DC

## 2017-03-30 NOTE — Progress Notes (Signed)
Subjective:    Patient ID: Jack Noble, male    DOB: May 19, 1929, 83 y.o.   MRN: [id number]  HPI  83 year old male who presents to the office for rash in his groin that was first noticed a week ago. His wife has been applying Nystatin cream. She noticed bleeding yesterday    Review of Systems See HPI   Past Medical History:  Diagnosis Date  . Allergy   . Aphasia 12/26/2013   Dr Jannifer Franklin  . Cataracts, bilateral   . Corneal abrasion    Dr. Ned Clines, [facility]  . Dermatophytosis of nail   . Diverticulosis   . Epistaxis   . Glaucoma    Dr. Edilia Bo  . Glaucoma   . Hearing loss   . History of GI diverticular bleed 710/15  . Hyperlipidemia   . Hyperplasia of prostate without urinary obstruction   . Hypertension   . Rosacea    Dr. Marge Duncans  . Sepsis due to Escherichia coli (E. coli) ([place]) 05/2011   Morganella also cultured  . TIA (transient ischemic attack) 9/6-05/2012   BP 221/99    Social History   Social History  . Marital status: Married    Spouse name: N/A  . Number of children: 1  . Years of education: college   Occupational History  . [employer] Retired   Social History Main Topics  . Smoking status: Never Smoker  . Smokeless tobacco: Never Used  . Alcohol use No  . Drug use: No  . Sexual activity: Not on file   Other Topics Concern  . Not on file   Social History Narrative   Lives with his wife.     Was in the [employer] for 15 years   Left handed    Past Surgical History:  Procedure Laterality Date  . APPENDECTOMY     age 66  . CATARACT EXTRACTION, BILATERAL  2003   Dr Ishmael Holter (now seen @ [place])  . CHOLECYSTECTOMY  1978   stones  . COLONOSCOPY W/ POLYPECTOMY  06/2009   Dr.Jacobs; "miniscule polyp"  . INGUINAL HERNIA REPAIR     age 82  . SKIN BIOPSY  12/2014   done by dermatologist   . TONSILLECTOMY AND ADENOIDECTOMY    . UPPER GI ENDOSCOPY  05/2014   negative    Family History  Problem Relation Age of Onset  . Stroke  Mother 37  . Parkinsonism Brother     Brother died at age at age 107  . Pulmonary embolism Father 59    Post Op  . Diabetes Neg Hx   . Cancer Neg Hx   . COPD Neg Hx   . Heart disease Neg Hx     Allergies  Allergen Reactions  . Amoxicillin     urticaria  . Codeine     Rash Because of a history of documented adverse serious drug reaction;Medi Alert bracelet  is recommended  . Minocycline Hcl     REACTION: nervous/chest pressure (Note: may have affected GE valve)  . Astelin [Azelastine Hcl]     Makes me jittery  . Azelastine Other (See Comments)  . Ether     pneumonia  . Zocor [Simvastatin] Other (See Comments)    Itching w/o, muscle problems and it affected his stomach  . Metoprolol     lightheaded    Current Outpatient Prescriptions on File Prior to Visit  Medication Sig Dispense Refill  . aspirin EC 81 MG tablet Take  81 mg by mouth daily.     . Cholecalciferol (VITAMIN D) 1000 UNITS capsule Take 1,000 Units by mouth every morning.     Water engineer Bandages & Supports (ABDOMINAL BINDER/ELASTIC LARGE) MISC Large or appropriate size Large abdominal hernia 1 each 0  . finasteride (PROSCAR) 5 MG tablet Take 1 tablet (5 mg total) by mouth daily. 90 tablet 3  . montelukast (SINGULAIR) 10 MG tablet Take 1 tablet (10 mg total) by mouth at bedtime. (Patient taking differently: Take 10 mg by mouth at bedtime as needed (allergies). ) 30 tablet 1  . Multiple Vitamin (MULTIVITAMIN WITH MINERALS) TABS Take 1 tablet by mouth every morning.     . nystatin-triamcinolone (MYCOLOG II) cream Apply 1 application topically daily as needed (Applies to red places in groin area.). --- Please establish with new PCP for further refills 30 g 0  . simethicone (MYLICON) 80 MG chewable tablet Chew 1 tablet (80 mg total) by mouth every 6 (six) hours as needed for flatulence. 30 tablet 0  . Timolol Maleate (ISTALOL) 0.5 % (DAILY) SOLN Place 1 drop into the left eye 2 (two) times daily at 8 am and 10 pm.     .  valsartan (DIOVAN) 40 MG tablet Take 1 tablet (40 mg total) by mouth daily. 30 tablet 5   No current facility-administered medications on file prior to visit.     BP 132/76 (BP Location: Left Arm, Patient Position: Sitting, Cuff Size: Normal)   Ht _0  (1.651 m)   Wt 139 lb 4.8 oz (63.2 kg)   BMI 23.18 kg/m       Objective:   Physical Exam  Constitutional: He is oriented to person, place, and time.  Cardiovascular: Normal rate, regular rhythm, normal heart sounds and intact distal pulses.  Exam reveals no gallop and no friction rub.   No murmur heard. Pulmonary/Chest: Effort normal and breath sounds normal. No respiratory distress. He has no wheezes. He has no rales. He exhibits no tenderness.  Neurological: He is alert and oriented to person, place, and time.  Skin: Skin is warm and dry. Rash noted.  Fungal rash noted in bilateral groin. No active bleeding noted  Psychiatric: He has a normal mood and affect. His behavior is normal. Judgment and thought content normal.  Nursing note and vitals reviewed.      Assessment & Plan:  1. Tinea cruris - Exam consistent with tine crusis  - nystatin (NYSTATIN) powder; Apply topically 3 (three) times daily.  Dispense: 30 g; Refill: 2 - Keep area clean and dry  - Follow up if no improvement   [name], NP

## 2017-03-30 NOTE — Patient Instructions (Signed)
You a a fungal infection in your groin. I have prescribed Nystain powder. Apply this 2-3 times per day. Keep the area clean and dry

## 2017-03-31 ENCOUNTER — Ambulatory Visit: Payer: MEDICARE | Admitting: Adult Health

## 2017-04-08 ENCOUNTER — Other Ambulatory Visit: Payer: Self-pay

## 2017-04-08 ENCOUNTER — Telehealth: Payer: Self-pay | Admitting: Adult Health

## 2017-04-08 MED ORDER — NYSTATIN-TRIAMCINOLONE 100000-0.1 UNIT/GM-% EX CREA: 1.0000 "application " | TOPICAL_CREAM | Freq: Two times a day (BID) | CUTANEOUS | 0 refills | Status: DC | PRN

## 2017-04-08 NOTE — Telephone Encounter (Signed)
That would be fine. Call in this in to use bid prn, 30 gram tube

## 2017-04-08 NOTE — Telephone Encounter (Signed)
Per Tommi Rumps, separate prescriptions are okay. I contacted pharmacy to send in this prescription, and pharmacist this has been run through and insurance will cover both prescriptions.  I left message notifying patient of new prescriptions being sent into pharmacy.

## 2017-04-08 NOTE — Telephone Encounter (Signed)
Pts wife calling back stating that the one tube of medication is going to cost them $114.00 and they are not able to afford it at this time.  Wife state that the pharmacy said if she could get 2 different kinds of tubes of the medication and mix herself it would be a little less expensive.    I spoke with Jerene Pitch and she state that she will contact the pharmacy and get back with the patient.

## 2017-04-08 NOTE — Telephone Encounter (Signed)
New rx has been sent in to preferred pharmacy. Thanks!

## 2017-04-08 NOTE — Telephone Encounter (Signed)
Dr. Sarajane Jews, would you mind reviewing this while Tommi Rumps is out of the office? Is the Nystatin-Triamcinolone cream okay to send in for patient?

## 2017-04-08 NOTE — Telephone Encounter (Signed)
Pt need would like to have nystatin-triamcinolone cream the nystatin powder is not working and would like to have the cream instead.  Pharm:  [facility] [phone]

## 2017-05-04 DIAGNOSIS — L219 Seborrheic dermatitis, unspecified: Secondary | ICD-10-CM | POA: Diagnosis not present

## 2017-05-04 DIAGNOSIS — L820 Inflamed seborrheic keratosis: Secondary | ICD-10-CM | POA: Diagnosis not present

## 2017-05-04 DIAGNOSIS — L570 Actinic keratosis: Secondary | ICD-10-CM | POA: Diagnosis not present

## 2017-05-04 DIAGNOSIS — D492 Neoplasm of unspecified behavior of bone, soft tissue, and skin: Secondary | ICD-10-CM | POA: Diagnosis not present

## 2017-05-14 DIAGNOSIS — H4052X1 Glaucoma secondary to other eye disorders, left eye, mild stage: Secondary | ICD-10-CM | POA: Diagnosis not present

## 2017-05-14 DIAGNOSIS — H40051 Ocular hypertension, right eye: Secondary | ICD-10-CM | POA: Diagnosis not present

## 2017-05-27 ENCOUNTER — Ambulatory Visit (INDEPENDENT_AMBULATORY_CARE_PROVIDER_SITE_OTHER): Payer: MEDICARE | Admitting: Adult Health

## 2017-05-27 ENCOUNTER — Encounter: Payer: Self-pay | Admitting: Adult Health

## 2017-05-27 VITALS — BP 100/62 | Ht 65.0 in | Wt 140.0 lb

## 2017-05-27 DIAGNOSIS — R1032 Left lower quadrant pain: Secondary | ICD-10-CM | POA: Diagnosis not present

## 2017-05-27 NOTE — Progress Notes (Signed)
Subjective:    Patient ID: Jack Noble, male    DOB: [birth year], 83 y.o.   MRN: [id number]  HPI  83 year old male who  has a past medical history of Aortic heart murmur; Chest pain (2007 ; 2011); Hyperlipidemia; Interstitial cystitis; Osteoporosis; and Postmenopausal. He presents to the office today with the complaint of left lower abdominal pain. He reports that his pain has been present on occasion for the last 3-4 months. He denies any worsening pain. The pain is described as " someone is hitting me in the side."  He has not had any rectal bleeding. He believes that his pain is stemming from his ventral hernia.   He is having normal bowel movements   Review of Systems See HPI   Past Medical History:  Diagnosis Date  . Allergy   . Aphasia 12/26/2013   Dr Jannifer Franklin  . Cataracts, bilateral   . Corneal abrasion    Dr. Ned Clines, [facility] Ophth  . Dermatophytosis of nail   . Diverticulosis   . Epistaxis   . Glaucoma    Dr. Edilia Bo  . Glaucoma   . Hearing loss   . History of GI diverticular bleed 710/15  . Hyperlipidemia   . Hyperplasia of prostate without urinary obstruction   . Hypertension   . Rosacea    Dr. Marge Duncans  . Sepsis due to Escherichia coli (E. coli) ([place]) 05/2011   Morganella also cultured  . TIA (transient ischemic attack) 9/6-05/2012   BP 221/99    Social History   Social History  . Marital status: Married    Spouse name: N/A  . Number of children: 1  . Years of education: college   Occupational History  . [employer] Retired   Social History Main Topics  . Smoking status: Never Smoker  . Smokeless tobacco: Never Used  . Alcohol use No  . Drug use: No  . Sexual activity: Not on file   Other Topics Concern  . Not on file   Social History Narrative   Lives with his wife.     Was in the [employer] for 15 years   Left handed    Past Surgical History:  Procedure Laterality Date  . APPENDECTOMY     age 84  . CATARACT EXTRACTION, BILATERAL   2003   Dr Ishmael Holter (now seen @ [place])  . CHOLECYSTECTOMY  1978   stones  . COLONOSCOPY W/ POLYPECTOMY  06/2009   Dr.Jacobs; "miniscule polyp"  . INGUINAL HERNIA REPAIR     age 41  . SKIN BIOPSY  12/2014   done by dermatologist   . TONSILLECTOMY AND ADENOIDECTOMY    . UPPER GI ENDOSCOPY  05/2014   negative    Family History  Problem Relation Age of Onset  . Stroke Mother 13  . Parkinsonism Brother        Brother died at age at age 61  . Pulmonary embolism Father 15       Post Op  . Diabetes Neg Hx   . Cancer Neg Hx   . COPD Neg Hx   . Heart disease Neg Hx     Allergies  Allergen Reactions  . Amoxicillin     urticaria  . Codeine     Rash Because of a history of documented adverse serious drug reaction;Medi Alert bracelet  is recommended  . Minocycline Hcl     REACTION: nervous/chest pressure (Note: may have affected GE valve)  . Astelin Lennie Muckle  Hcl]     Makes me jittery  . Azelastine Other (See Comments)  . Ether     pneumonia  . Zocor [Simvastatin] Other (See Comments)    Itching w/o, muscle problems and it affected his stomach  . Metoprolol     lightheaded    Current Outpatient Prescriptions on File Prior to Visit  Medication Sig Dispense Refill  . aspirin EC 81 MG tablet Take 81 mg by mouth daily.     . Cholecalciferol (VITAMIN D) 1000 UNITS capsule Take 1,000 Units by mouth every morning.     Water engineer Bandages & Supports (ABDOMINAL BINDER/ELASTIC LARGE) MISC Large or appropriate size Large abdominal hernia 1 each 0  . finasteride (PROSCAR) 5 MG tablet Take 1 tablet (5 mg total) by mouth daily. 90 tablet 3  . montelukast (SINGULAIR) 10 MG tablet Take 1 tablet (10 mg total) by mouth at bedtime. (Patient taking differently: Take 10 mg by mouth at bedtime as needed (allergies). ) 30 tablet 1  . Multiple Vitamin (MULTIVITAMIN WITH MINERALS) TABS Take 1 tablet by mouth every morning.     . nystatin (NYSTATIN) powder Apply topically 3 (three) times daily. 30  g 2  . nystatin-triamcinolone (MYCOLOG II) cream Apply 1 application topically 2 (two) times daily as needed (Applies to red places in groin area.). 30 g 0  . simethicone (MYLICON) 80 MG chewable tablet Chew 1 tablet (80 mg total) by mouth every 6 (six) hours as needed for flatulence. 30 tablet 0  . Timolol Maleate (ISTALOL) 0.5 % (DAILY) SOLN Place 1 drop into the left eye 2 (two) times daily at 8 am and 10 pm.     . valsartan (DIOVAN) 40 MG tablet Take 1 tablet (40 mg total) by mouth daily. 30 tablet 5   No current facility-administered medications on file prior to visit.     BP 100/62 (BP Location: Left Arm, Patient Position: Sitting, Cuff Size: Small)   Ht '5\' 5"'  (1.651 m)   Wt 140 lb (63.5 kg)   BMI 23.30 kg/m       Objective:   Physical Exam  Constitutional: He is oriented to person, place, and time. He appears well-developed and well-nourished. No distress.  Cardiovascular: Normal rate, regular rhythm, normal heart sounds and intact distal pulses.  Exam reveals no gallop and no friction rub.   No murmur heard. Pulmonary/Chest: Effort normal and breath sounds normal. No respiratory distress. He has no wheezes. He has no rales. He exhibits no tenderness.  Abdominal: Bowel sounds are normal. He exhibits no mass. There is tenderness in the periumbilical area. There is no rigidity, no rebound, no guarding, no tenderness at McBurney's point and negative Murphy's sign. A hernia is present. Hernia confirmed positive in the ventral area.  Neurological: He is alert and oriented to person, place, and time.  Skin: Skin is warm and dry. No rash noted. He is not diaphoretic. No erythema. No pallor.  Psychiatric: He has a normal mood and affect. His behavior is normal. Judgment and thought content normal.  Nursing note and vitals reviewed.     Assessment & Plan:  1. Abdominal pain, acute, left lower quadrant - There was no pain with palpation to LLQ quadrant. Pain with palpation over hernia.  No bruising noted. Hernia is not incarcerated. He is a poor surgical candidate. Will get CT of abdomen for further evaluation  - CT ABDOMEN WO CONTRAST; Future  - [name], NP

## 2017-05-27 NOTE — Patient Instructions (Signed)
It was great seeing you today   Someone will call you to schedule your cat scan.

## 2017-05-31 ENCOUNTER — Telehealth: Payer: Self-pay | Admitting: Adult Health

## 2017-05-31 NOTE — Telephone Encounter (Signed)
° ° ° °  Pt did not go for the CT    Brandy a NP who go to chuch with them call to say pt did not have the Ct done. He was not willing to go. He is kind of up and down per Theadora Rama She thinks he has a dementia. She is not on his DPR but does want to expalain to you what is going on     [phone]

## 2017-06-01 ENCOUNTER — Inpatient Hospital Stay: Admission: RE | Admit: 2017-06-01 | Payer: MEDICARE | Source: Ambulatory Visit

## 2017-06-04 ENCOUNTER — Other Ambulatory Visit: Payer: Self-pay | Admitting: Internal Medicine

## 2017-06-04 NOTE — Telephone Encounter (Signed)
Rx done. 

## 2017-06-04 NOTE — Telephone Encounter (Signed)
Ok to refill for one year  

## 2017-06-10 ENCOUNTER — Ambulatory Visit (INDEPENDENT_AMBULATORY_CARE_PROVIDER_SITE_OTHER): Payer: MEDICARE | Admitting: Adult Health

## 2017-06-10 ENCOUNTER — Encounter: Payer: Self-pay | Admitting: Adult Health

## 2017-06-10 VITALS — BP 120/70 | HR 64 | Temp 97.6°F | Wt 139.4 lb

## 2017-06-10 DIAGNOSIS — R4189 Other symptoms and signs involving cognitive functions and awareness: Secondary | ICD-10-CM | POA: Diagnosis not present

## 2017-06-10 DIAGNOSIS — R6883 Chills (without fever): Secondary | ICD-10-CM | POA: Diagnosis not present

## 2017-06-10 LAB — CBC WITH DIFFERENTIAL/PLATELET
BASOS PCT: 0.8 % (ref 0.0–3.0)
Basophils Absolute: 0 10*3/uL (ref 0.0–0.1)
EOS ABS: 0.1 10*3/uL (ref 0.0–0.7)
EOS PCT: 2.3 % (ref 0.0–5.0)
HCT: 39.1 % (ref 39.0–52.0)
HEMOGLOBIN: 13.1 g/dL (ref 13.0–17.0)
Lymphocytes Relative: 29.2 % (ref 12.0–46.0)
Lymphs Abs: 1.7 10*3/uL (ref 0.7–4.0)
MCHC: 33.6 g/dL (ref 30.0–36.0)
MCV: 99.9 fl (ref 78.0–100.0)
MONO ABS: 0.5 10*3/uL (ref 0.1–1.0)
Monocytes Relative: 9.5 % (ref 3.0–12.0)
NEUTROS PCT: 58.2 % (ref 43.0–77.0)
Neutro Abs: 3.3 10*3/uL (ref 1.4–7.7)
Platelets: 200 10*3/uL (ref 150.0–400.0)
RBC: 3.91 Mil/uL — ABNORMAL LOW (ref 4.22–5.81)
RDW: 12.8 % (ref 11.5–15.5)
WBC: 5.7 10*3/uL (ref 4.0–10.5)

## 2017-06-10 LAB — POCT URINALYSIS DIPSTICK
Bilirubin, UA: NEGATIVE
Blood, UA: NEGATIVE
Glucose, UA: NEGATIVE
Ketones, UA: NEGATIVE
Leukocytes, UA: NEGATIVE
Nitrite, UA: NEGATIVE
Protein, UA: NEGATIVE
Spec Grav, UA: 1.01
Urobilinogen, UA: 0.2 U/dL
pH, UA: 6

## 2017-06-10 LAB — BASIC METABOLIC PANEL
BUN: 9 mg/dL (ref 6–23)
CALCIUM: 9.3 mg/dL (ref 8.4–10.5)
CO2: 31 mEq/L (ref 19–32)
Chloride: 105 mEq/L (ref 96–112)
Creatinine, Ser: 0.65 mg/dL (ref 0.40–1.50)
GFR: 123.68 mL/min (ref >60.00)
GLUCOSE: 79 mg/dL (ref 70–99)
Potassium: 4.7 mEq/L (ref 3.5–5.1)
SODIUM: 139 meq/L (ref 135–145)

## 2017-06-10 NOTE — Progress Notes (Signed)
Subjective:    Patient ID: Jack Noble, male    DOB: 1929-03-06, 83 y.o.   MRN: [id number]  HPI  83 year old male who  has a past medical history of Allergy; Aphasia (12/26/2013); Cataracts, bilateral; Corneal abrasion; Dermatophytosis of nail; Diverticulosis; Epistaxis; Glaucoma; Glaucoma; Hearing loss; History of GI diverticular bleed (710/15); Hyperlipidemia; Hyperplasia of prostate without urinary obstruction; Hypertension; Rosacea; Sepsis due to Escherichia coli (E. coli) ([place]) (05/2011); and TIA (transient ischemic attack) (9/6-05/2012). He presents with his wife to the office today for confusion. His wife reports that Jack Noble has had some degree of confusion in the past and I have noticed that during his visits that he tells the same story over and over. This morning when his wife woke up this morning at 6 am and Jack Noble was sitting fully dressed for the day in his chair in the bedroom and he seemed confused  They went downstairs and had breakfast and he started to come back to baseline.   Jack Noble reports feeling nauseated and had chills this morning. Denies any fevers.   He does have an appointment with a dentist today for possible abscessed tooth.   Review of Systems See HPI   Past Medical History:  Diagnosis Date  . Allergy   . Aphasia 12/26/2013   Dr Jannifer Franklin  . Cataracts, bilateral   . Corneal abrasion    Dr. Ned Clines, [facility] Op[facility]  . Dermatophytosis of nail   . Diverticulosis   . Epistaxis   . Glaucoma    Dr. Edilia Bo  . Glaucoma   . Hearing loss   . History of GI diverticular bleed 710/15  . Hyperlipidemia   . Hyperplasia of prostate without urinary obstruction   . Hypertension   . Rosacea    Dr. Marge Duncans  . Sepsis due to Escherichia coli (E. coli) ([place]) 05/2011   Morganella also cultured  . TIA (transient ischemic attack) 9/6-05/2012   BP 221/99    Social History   Social History  . Marital status: Married    Spouse name: N/A  . Number of children: 1  . Years of  education: college   Occupational History  . [employer] Retired   Social History Main Topics  . Smoking status: Never Smoker  . Smokeless tobacco: Never Used  . Alcohol use No  . Drug use: No  . Sexual activity: Not on file   Other Topics Concern  . Not on file   Social History Narrative   Lives with his wife.     Was in the [employer] for 15 years   Left handed    Past Surgical History:  Procedure Laterality Date  . APPENDECTOMY     age 14  . CATARACT EXTRACTION, BILATERAL  2003   Dr Ishmael Holter (now seen @ [place])  . CHOLECYSTECTOMY  1978   stones  . COLONOSCOPY W/ POLYPECTOMY  06/2009   Dr.Jacobs; "miniscule polyp"  . INGUINAL HERNIA REPAIR     age 69  . SKIN BIOPSY  12/2014   done by dermatologist   . TONSILLECTOMY AND ADENOIDECTOMY    . UPPER GI ENDOSCOPY  05/2014   negative    Family History  Problem Relation Age of Onset  . Stroke Mother 29  . Parkinsonism Brother        Brother died at age at age 110  . Pulmonary embolism Father 63       Post Op  . Diabetes Neg Hx   . Cancer  Neg Hx   . COPD Neg Hx   . Heart disease Neg Hx     Allergies  Allergen Reactions  . Amoxicillin     urticaria  . Codeine     Rash Because of a history of documented adverse serious drug reaction;Medi Alert bracelet  is recommended  . Minocycline Hcl     REACTION: nervous/chest pressure (Note: may have affected GE valve)  . Astelin [Azelastine Hcl]     Makes me jittery  . Azelastine Other (See Comments)  . Ether     pneumonia  . Zocor [Simvastatin] Other (See Comments)    Itching w/o, muscle problems and it affected his stomach  . Metoprolol     lightheaded    Current Outpatient Prescriptions on File Prior to Visit  Medication Sig Dispense Refill  . aspirin EC 81 MG tablet Take 81 mg by mouth daily.     . Cholecalciferol (VITAMIN D) 1000 UNITS capsule Take 1,000 Units by mouth every morning.     Water engineer Bandages & Supports (ABDOMINAL BINDER/ELASTIC LARGE)  MISC Large or appropriate size Large abdominal hernia 1 each 0  . finasteride (PROSCAR) 5 MG tablet Take 1 tablet (5 mg total) by mouth daily. 90 tablet 3  . montelukast (SINGULAIR) 10 MG tablet Take 1 tablet (10 mg total) by mouth at bedtime. (Patient taking differently: Take 10 mg by mouth at bedtime as needed (allergies). ) 30 tablet 1  . Multiple Vitamin (MULTIVITAMIN WITH MINERALS) TABS Take 1 tablet by mouth every morning.     . nystatin (NYSTATIN) powder Apply topically 3 (three) times daily. 30 g 2  . nystatin-triamcinolone (MYCOLOG II) cream Apply 1 application topically 2 (two) times daily as needed (Applies to red places in groin area.). 30 g 0  . simethicone (MYLICON) 80 MG chewable tablet Chew 1 tablet (80 mg total) by mouth every 6 (six) hours as needed for flatulence. 30 tablet 0  . Timolol Maleate (ISTALOL) 0.5 % (DAILY) SOLN Place 1 drop into the left eye 2 (two) times daily at 8 am and 10 pm.     . valsartan (DIOVAN) 40 MG tablet TAKE 1 TABLET (40 MG TOTAL) BY MOUTH DAILY. 30 tablet 11   No current facility-administered medications on file prior to visit.     BP 120/70 (BP Location: Left Arm, Patient Position: Sitting, Cuff Size: Normal)   Pulse 64   Temp 97.6 F (36.4 C) (Oral)   Wt 139 lb 6.4 oz (63.2 kg)   SpO2 96%   BMI 23.20 kg/m       Objective:   Physical Exam  Constitutional: He is oriented to person, place, and time. He appears well-developed and well-nourished. No distress.  HENT:  Mouth/Throat: Uvula is midline.  Cardiovascular: Normal rate, regular rhythm, normal heart sounds and intact distal pulses.  Exam reveals no gallop and no friction rub.   No murmur heard. Pulmonary/Chest: Effort normal and breath sounds normal. No respiratory distress. He has no wheezes. He has no rales. He exhibits no tenderness.  Neurological: He is alert and oriented to person, place, and time.  Skin: Skin is warm and dry. No rash noted. He is not diaphoretic. No erythema.  No pallor.  Psychiatric: He has a normal mood and affect. His behavior is normal. Judgment and thought content normal. Cognition and memory are impaired.  Nursing note and vitals reviewed.     Assessment & Plan:  1. Cognitive impairment - Transient confusion possibly from dental infection?  He did repeat stories in the exam room today. I would like him to follow up with his neurologist  - CBC with Differential/Platelet - Basic metabolic panel - POCT urinalysis dipstick- negative  - Follow up as needed  [name], NP

## 2017-06-21 ENCOUNTER — Telehealth: Payer: Self-pay

## 2017-06-21 NOTE — Telephone Encounter (Signed)
Pt's wife called to report that pt has a hemorrhoid and she was not sure how to treat it. She states that pt has been having some issues with constipation. Advised her she can use OTC hemorrhoid cream/wipes as directed. She also has generic Colace and advised her she can give him that following package directions. Advised wife to contact office if stool softener is not working, pt goes 3 days w/o bm or she notices any blood. She voiced understanding to all recommendations. Nothing further needed at this time.

## 2017-07-09 ENCOUNTER — Telehealth: Payer: Self-pay | Admitting: Adult Health

## 2017-07-09 NOTE — Telephone Encounter (Signed)
Pt only has 3 pills of valsartan left and would like to know should he take and will needs a new rx for different bp med. Valsartan has been recalled  sent to [facility] [address]

## 2017-07-12 MED ORDER — LOSARTAN POTASSIUM 50 MG PO TABS: 50.0000 mg | ORAL_TABLET | Freq: Every day | ORAL | 1 refills | Status: DC

## 2017-07-12 NOTE — Telephone Encounter (Signed)
Ok to send in Cozaar 50 mg, take daily x 90 days + 1 refill

## 2017-07-12 NOTE — Telephone Encounter (Signed)
I called the pts wife and informed her the Rx for Cozaar will be sent to the pts pharmacy to replace Valsartan.  She stated someone called her from the pharmacy this weekend and gave him a new Rx for Valsartan from a different company.  I advised [name] I did not see a note in which this was approved or sent by Tommi Rumps and to contact the pharmacy with questions.

## 2017-07-12 NOTE — Telephone Encounter (Signed)
I spoke with Mrs Muzquiz and she stated the pt started the new Rx that the pharmacy gave him yesterday which is a [employer] brand of Valsartan and she questioned what he should take today? Message sent to Dr Elease Hashimoto as Tommi Rumps is out of the office.

## 2017-07-12 NOTE — Telephone Encounter (Signed)
Go ahead with the Losartan one daily

## 2017-07-12 NOTE — Telephone Encounter (Signed)
I called the pt and informed his wife of the message below. 

## 2017-07-12 NOTE — Telephone Encounter (Signed)
Pricilla Holm pt wife would like to know if her husband should take cozaar this morning

## 2017-07-21 ENCOUNTER — Ambulatory Visit (INDEPENDENT_AMBULATORY_CARE_PROVIDER_SITE_OTHER): Payer: MEDICARE | Admitting: Adult Health

## 2017-07-21 VITALS — BP 144/60 | Temp 97.7°F | Ht 65.0 in | Wt 140.5 lb

## 2017-07-21 DIAGNOSIS — Z23 Encounter for immunization: Secondary | ICD-10-CM

## 2017-07-21 DIAGNOSIS — M542 Cervicalgia: Secondary | ICD-10-CM

## 2017-07-21 NOTE — Addendum Note (Signed)
Addended by: Miles Costain T on: 07/21/2017 03:33 PM   Modules accepted: Orders

## 2017-07-21 NOTE — Patient Instructions (Signed)
It is nice seeing you today   I want you to take one tylenol before you go to bed. I would also like you to get a new pillow to use while sleeping   Let me know if your neck pain does not improve

## 2017-07-21 NOTE — Progress Notes (Signed)
Subjective:    Patient ID: Jack Noble, male    DOB: 24-Feb-1929, 83 y.o.   MRN: [id number]  HPI  83 year old male who  has a past medical history of Allergy; Aphasia (12/26/2013); Cataracts, bilateral; Corneal abrasion; Dermatophytosis of nail; Diverticulosis; Epistaxis; Glaucoma; Glaucoma; Hearing loss; History of GI diverticular bleed (710/15); Hyperlipidemia; Hyperplasia of prostate without urinary obstruction; Hypertension; Rosacea; Sepsis due to Escherichia coli (E. coli) ([place]) (05/2011); and TIA (transient ischemic attack) (9/6-05/2012). He presents to the office today for with multiple complaints   1. Neck pain - this has been a chronic issue but reports that over the last 5 days it has become worse. Pain is worse with movement but does not have any loss of ROM. He is unable to describe his pain, except for " it hurts". He does not feel as though the pain is worse at any specific time in the day. His wife reports that the pain is worse in the morning.   Review of Systems See HPI   Past Medical History:  Diagnosis Date  . Allergy   . Aphasia 12/26/2013   Dr Jannifer Franklin  . Cataracts, bilateral   . Corneal abrasion    Dr. Ned Clines, [facility] Ophth  . Dermatophytosis of nail   . Diverticulosis   . Epistaxis   . Glaucoma    Dr. Edilia Bo  . Glaucoma   . Hearing loss   . History of GI diverticular bleed 710/15  . Hyperlipidemia   . Hyperplasia of prostate without urinary obstruction   . Hypertension   . Rosacea    Dr. Marge Duncans  . Sepsis due to Escherichia coli (E. coli) ([place]) 05/2011   Morganella also cultured  . TIA (transient ischemic attack) 9/6-05/2012   BP 221/99    Social History   Social History  . Marital status: Married    Spouse name: N/A  . Number of children: 1  . Years of education: college   Occupational History  . [employer] Retired   Social History Main Topics  . Smoking status: Never Smoker  . Smokeless tobacco: Never Used  . Alcohol use No  . Drug  use: No  . Sexual activity: Not on file   Other Topics Concern  . Not on file   Social History Narrative   Lives with his wife.     Was in the [employer] for 15 years   Left handed    Past Surgical History:  Procedure Laterality Date  . APPENDECTOMY     age 53  . CATARACT EXTRACTION, BILATERAL  2003   Dr Ishmael Holter (now seen @ [place])  . CHOLECYSTECTOMY  1978   stones  . COLONOSCOPY W/ POLYPECTOMY  06/2009   Dr.Jacobs; "miniscule polyp"  . INGUINAL HERNIA REPAIR     age 61  . SKIN BIOPSY  12/2014   done by dermatologist   . TONSILLECTOMY AND ADENOIDECTOMY    . UPPER GI ENDOSCOPY  05/2014   negative    Family History  Problem Relation Age of Onset  . Stroke Mother 24  . Parkinsonism Brother        Brother died at age at age 50  . Pulmonary embolism Father 76       Post Op  . Diabetes Neg Hx   . Cancer Neg Hx   . COPD Neg Hx   . Heart disease Neg Hx     Allergies  Allergen Reactions  . Amoxicillin  urticaria  . Codeine     Rash Because of a history of documented adverse serious drug reaction;Medi Alert bracelet  is recommended  . Minocycline Hcl     REACTION: nervous/chest pressure (Note: may have affected GE valve)  . Astelin [Azelastine Hcl]     Makes me jittery  . Azelastine Other (See Comments)  . Ether     pneumonia  . Zocor [Simvastatin] Other (See Comments)    Itching w/o, muscle problems and it affected his stomach  . Metoprolol     lightheaded    Current Outpatient Prescriptions on File Prior to Visit  Medication Sig Dispense Refill  . aspirin EC 81 MG tablet Take 81 mg by mouth daily.     . Cholecalciferol (VITAMIN D) 1000 UNITS capsule Take 1,000 Units by mouth every morning.     Clinical research associate Bandages & Supports (ABDOMINAL BINDER/ELASTIC LARGE) MISC Large or appropriate size Large abdominal hernia 1 each 0  . finasteride (PROSCAR) 5 MG tablet Take 1 tablet (5 mg total) by mouth daily. 90 tablet 3  . losartan (COZAAR) 50 MG tablet Take 1  tablet (50 mg total) by mouth daily. 90 tablet 1  . montelukast (SINGULAIR) 10 MG tablet Take 1 tablet (10 mg total) by mouth at bedtime. (Patient taking differently: Take 10 mg by mouth at bedtime as needed (allergies). ) 30 tablet 1  . Multiple Vitamin (MULTIVITAMIN WITH MINERALS) TABS Take 1 tablet by mouth every morning.     . nystatin-triamcinolone (MYCOLOG II) cream Apply 1 application topically 2 (two) times daily as needed (Applies to red places in groin area.). 30 g 0  . simethicone (MYLICON) 80 MG chewable tablet Chew 1 tablet (80 mg total) by mouth every 6 (six) hours as needed for flatulence. 30 tablet 0  . Timolol Maleate (ISTALOL) 0.5 % (DAILY) SOLN Place 1 drop into the left eye 2 (two) times daily at 8 am and 10 pm.      No current facility-administered medications on file prior to visit.     BP (!) 144/60 (BP Location: Left Arm)   Temp 97.7 F (36.5 C) (Oral)   Ht 5\' 5"  (1.651 m)   Wt 140 lb 8 oz (63.7 kg)   BMI 23.38 kg/m       Objective:   Physical Exam  Constitutional: He is oriented to person, place, and time. He appears well-developed and well-nourished. No distress.  Cardiovascular: Normal rate, regular rhythm, normal heart sounds and intact distal pulses.  Exam reveals no gallop and no friction rub.   No murmur heard. Pulmonary/Chest: Effort normal and breath sounds normal. No respiratory distress. He has no wheezes. He has no rales. He exhibits no tenderness.  Musculoskeletal: Normal range of motion. He exhibits tenderness. He exhibits no edema or deformity.  Tenderness to lower cervical spine and right scapula.   Neurological: He is alert and oriented to person, place, and time.  Skin: Skin is warm and dry. No rash noted. He is not diaphoretic. No erythema. No pallor.  Psychiatric: He has a normal mood and affect. His behavior is normal.  Nursing note and vitals reviewed.     Assessment & Plan:  1. Neck pain - Appears to be more MSK in nature. Probable  some arthritic pain  - I am going to have him use Apercreme and take tylenol at night  - A new pillow will also probably help  - Follow up if no improvement and consider imaging at that time  [name], NP

## 2017-07-24 ENCOUNTER — Emergency Department (HOSPITAL_COMMUNITY)
Admission: EM | Admit: 2017-07-24 | Discharge: 2017-07-24 | Disposition: A | Discharge status: Home / Self Care | Payer: MEDICARE | Attending: Emergency Medicine | Admitting: Emergency Medicine

## 2017-07-24 ENCOUNTER — Encounter (HOSPITAL_COMMUNITY): Payer: Self-pay | Admitting: Emergency Medicine

## 2017-07-24 DIAGNOSIS — R102 Pelvic and perineal pain: Secondary | ICD-10-CM | POA: Diagnosis not present

## 2017-07-24 DIAGNOSIS — I10 Essential (primary) hypertension: Secondary | ICD-10-CM | POA: Insufficient documentation

## 2017-07-24 DIAGNOSIS — Z7982 Long term (current) use of aspirin: Secondary | ICD-10-CM | POA: Insufficient documentation

## 2017-07-24 DIAGNOSIS — K4090 Unilateral inguinal hernia, without obstruction or gangrene, not specified as recurrent: Secondary | ICD-10-CM

## 2017-07-24 DIAGNOSIS — Z79899 Other long term (current) drug therapy: Secondary | ICD-10-CM | POA: Diagnosis not present

## 2017-07-24 NOTE — ED Triage Notes (Signed)
Pt from home with complaints of pain from a right sided inguinal hernia that he has had for a long time. Patient is able to walk, stand, urinate and move bowels normally. Per EMS, patient vomiting at home but not en route. Patient has hx of expressive aphasia from previous urosepsis.

## 2017-07-24 NOTE — ED Provider Notes (Signed)
[facility] DEPT Provider Note: Georgena Spurling, MD, FACEP  CSN: [id number] MRN: [id number] ARRIVAL: 07/24/17 at [place]: [place]  Inguinal Hernia   HISTORY OF PRESENT ILLNESS  07/24/17 3:07 AM Jack Noble is a 83 y.o. male with a history of a right upper quadrant abdominal wall hernia as well as a right inguinal hernia. He is here with acute exacerbation of his right inguinal hernia which protruded yesterday evening after dinner. The hernia usually reduces itself with lying supine but it did not reduce as easily as it usually does. This is the first significant episode he has had it about a year. The hernia became increasingly painful to the point of being severe. He had a profuse episode of vomiting just prior to EMS arrival. Since lying in the ED the hernia has reduced itself and he is now pain-free.   Past Medical History:  Diagnosis Date  . Allergy   . Aphasia 12/26/2013   Dr Jannifer Franklin  . Cataracts, bilateral   . Corneal abrasion    Dr. Ned Clines, [facility]  . Dermatophytosis of nail   . Diverticulosis   . Epistaxis   . Glaucoma    Dr. Edilia Bo  . Glaucoma   . Hearing loss   . History of GI diverticular bleed 710/15  . Hyperlipidemia   . Hyperplasia of prostate without urinary obstruction   . Hypertension   . Rosacea    Dr. Marge Duncans  . Sepsis due to Escherichia coli (E. coli) ([place]) 05/2011   Morganella also cultured  . TIA (transient ischemic attack) 9/6-05/2012   BP 221/99    Past Surgical History:  Procedure Laterality Date  . APPENDECTOMY     age 36  . CATARACT EXTRACTION, BILATERAL  2003   Dr Ishmael Holter (now seen @ [place])  . CHOLECYSTECTOMY  1978   stones  . COLONOSCOPY W/ POLYPECTOMY  06/2009   Dr.Jacobs; "miniscule polyp"  . HERNIA REPAIR    . INGUINAL HERNIA REPAIR     age 76  . SKIN BIOPSY  12/2014   done by dermatologist   . TONSILLECTOMY AND ADENOIDECTOMY    . UPPER GI ENDOSCOPY  05/2014   negative    Family History    Problem Relation Age of Onset  . Stroke Mother 3  . Parkinsonism Brother        Brother died at age at age 40  . Pulmonary embolism Father 49       Post Op  . Diabetes Neg Hx   . Cancer Neg Hx   . COPD Neg Hx   . Heart disease Neg Hx     Social History  Substance Use Topics  . Smoking status: Never Smoker  . Smokeless tobacco: Never Used  . Alcohol use No    Prior to Admission medications   Medication Sig Start Date End Date Taking? Authorizing Provider  aspirin EC 81 MG tablet Take 81 mg by mouth daily.     [provider]  Cholecalciferol (VITAMIN D) 1000 UNITS capsule Take 1,000 Units by mouth every morning.     [provider]  Elastic Bandages & Supports (ABDOMINAL BINDER/ELASTIC LARGE) MISC Large or appropriate size Large abdominal hernia 08/10/16   Binnie Rail, MD  finasteride (PROSCAR) 5 MG tablet Take 1 tablet (5 mg total) by mouth daily. 03/04/17   Nafziger, Tommi Rumps, NP  losartan (COZAAR) 50 MG tablet Take 1 tablet (50 mg total) by mouth daily. 07/12/17  Nafziger, Tommi Rumps, NP  montelukast (SINGULAIR) 10 MG tablet Take 1 tablet (10 mg total) by mouth at bedtime. Patient taking differently: Take 10 mg by mouth at bedtime as needed (allergies).  02/06/15   Hendricks Limes, MD  Multiple Vitamin (MULTIVITAMIN WITH MINERALS) TABS Take 1 tablet by mouth every morning.     [provider]  nystatin-triamcinolone (MYCOLOG II) cream Apply 1 application topically 2 (two) times daily as needed (Applies to red places in groin area.). 04/08/17   Laurey Morale, MD  simethicone (MYLICON) 80 MG chewable tablet Chew 1 tablet (80 mg total) by mouth every 6 (six) hours as needed for flatulence. 06/03/14   Kinnie Feil, MD  Timolol Maleate (ISTALOL) 0.5 % (DAILY) SOLN Place 1 drop into the left eye 2 (two) times daily at 8 am and 10 pm.     [provider]    Allergies Amoxicillin; Codeine; Minocycline hcl; Astelin [azelastine hcl]; Azelastine; Ether;  Zocor [simvastatin]; and Metoprolol   REVIEW OF SYSTEMS  Negative except as noted here or in the History of Present Illness.   PHYSICAL EXAMINATION  Initial Vital Signs Blood pressure (!) 163/72, pulse 60, temperature 97.6 F (36.4 C), temperature source Oral, resp. rate 20, height 5\' 5"  (1.651 m), weight 68 kg (150 lb).  Examination General: Well-developed, well-nourished male in no acute distress; appearance consistent with age of record HENT: normocephalic; atraumatic Eyes: pupils equal, round and reactive to light; extraocular muscles intact; lens implants Neck: supple Heart: regular rate and rhythm Lungs: clear to auscultation bilaterally Abdomen: soft; nondistended; nontender; nontender right upper quadrant abdominal wall hernia; bowel sounds present; no tenderness or palpable hernia of right inguinal region Extremities: No deformity; full range of motion; pulses normal Neurologic: Awake, alert; partial expressive aphasia; motor function intact in all extremities and symmetric; no facial droop Skin: Warm and dry Psychiatric: Normal mood and affect   RESULTS  Summary of this visit's results, reviewed by myself:   EKG Interpretation  Date/Time:    Ventricular Rate:    PR Interval:    QRS Duration:   QT Interval:    QTC Calculation:   R Axis:     Text Interpretation:        Laboratory Studies: No results found for this or any previous visit (from the past 24 hour(s)). Imaging Studies: No results found.  ED COURSE  Nursing notes and initial vitals signs, including pulse oximetry, reviewed.  Vitals:   07/24/17 0143  BP: (!) 163/72  Pulse: 60  Resp: 20  Temp: 97.6 F (36.4 C)  TempSrc: Oral  Weight: 68 kg (150 lb)  Height: 5\' 5"  (1.651 m)    PROCEDURES    ED DIAGNOSES     ICD-10-CM   1. Right inguinal hernia K40.90        Jaymes Hang, Jenny Reichmann, MD 07/24/17 [phone]

## 2017-07-24 NOTE — ED Notes (Signed)
Bed: GB02 Expected date:  Expected time:  Means of arrival:  Comments: 83 yo hernia/hypotensive

## 2017-07-28 ENCOUNTER — Ambulatory Visit (INDEPENDENT_AMBULATORY_CARE_PROVIDER_SITE_OTHER): Payer: MEDICARE | Admitting: Adult Health

## 2017-07-28 ENCOUNTER — Encounter: Payer: Self-pay | Admitting: Adult Health

## 2017-07-28 VITALS — BP 150/62 | Temp 98.1°F | Ht 65.0 in | Wt 138.0 lb

## 2017-07-28 DIAGNOSIS — K4090 Unilateral inguinal hernia, without obstruction or gangrene, not specified as recurrent: Secondary | ICD-10-CM | POA: Diagnosis not present

## 2017-07-28 NOTE — Progress Notes (Signed)
Subjective:    Patient ID: Jack Noble, male    DOB: 08/27/1929, 83 y.o.   MRN: [id number]  HPI  83 year old male who  has a past medical history of Allergy; Aphasia (12/26/2013); Cataracts, bilateral; Corneal abrasion; Dermatophytosis of nail; Diverticulosis; Epistaxis; Glaucoma; Glaucoma; Hearing loss; History of GI diverticular bleed (710/15); Hyperlipidemia; Hyperplasia of prostate without urinary obstruction; Hypertension; Rosacea; Sepsis due to Escherichia coli (E. coli) ([place]) (05/2011); and TIA (transient ischemic attack) (9/6-05/2012).  He presents to the clinic today after being seen in the ER four days ago for acute exacerbation of right inguinal hernia which he could not reduce at home. Per ER note, the hernia became painful and he had a profuse episode of vomiting prior to EMS arrival. When laying in his bed in the ER the hernia reduced himself.   He has episodic episodes of constipation   Today in the office he reports that he is feeling " back to normal" He has not had any pain in his right inguinal area. He denies any bruising. He is following up with me as he was told to do so by the ER MD  Review of Systems See HPI   Past Medical History:  Diagnosis Date  . Allergy   . Aphasia 12/26/2013   Dr Jannifer Franklin  . Cataracts, bilateral   . Corneal abrasion    Dr. Ned Clines, [facility]  . Dermatophytosis of nail   . Diverticulosis   . Epistaxis   . Glaucoma    Dr. Edilia Bo  . Glaucoma   . Hearing loss   . History of GI diverticular bleed 710/15  . Hyperlipidemia   . Hyperplasia of prostate without urinary obstruction   . Hypertension   . Rosacea    Dr. Marge Duncans  . Sepsis due to Escherichia coli (E. coli) ([place]) 05/2011   Morganella also cultured  . TIA (transient ischemic attack) 9/6-05/2012   BP 221/99    Social History   Social History  . Marital status: Married    Spouse name: N/A  . Number of children: 1  . Years of education: college   Occupational History  .  [employer] Retired   Social History Main Topics  . Smoking status: Never Smoker  . Smokeless tobacco: Never Used  . Alcohol use No  . Drug use: No  . Sexual activity: Not on file   Other Topics Concern  . Not on file   Social History Narrative   Lives with his wife.     Was in the [employer] for 15 years   Left handed    Past Surgical History:  Procedure Laterality Date  . APPENDECTOMY     age 83  . CATARACT EXTRACTION, BILATERAL  2003   Dr Ishmael Holter (now seen @ [place])  . CHOLECYSTECTOMY  1978   stones  . COLONOSCOPY W/ POLYPECTOMY  06/2009   Dr.Jacobs; "miniscule polyp"  . HERNIA REPAIR    . INGUINAL HERNIA REPAIR     age 23  . SKIN BIOPSY  12/2014   done by dermatologist   . TONSILLECTOMY AND ADENOIDECTOMY    . UPPER GI ENDOSCOPY  05/2014   negative    Family History  Problem Relation Age of Onset  . Stroke Mother 8  . Parkinsonism Brother        Brother died at age at age 53  . Pulmonary embolism Father 34       Post Op  . Diabetes Neg  Hx   . Cancer Neg Hx   . COPD Neg Hx   . Heart disease Neg Hx     Allergies  Allergen Reactions  . Amoxicillin     urticaria  . Codeine     Rash Because of a history of documented adverse serious drug reaction;Medi Alert bracelet  is recommended  . Minocycline Hcl     REACTION: nervous/chest pressure (Note: may have affected GE valve)  . Astelin [Azelastine Hcl]     Makes me jittery  . Azelastine Other (See Comments)  . Ether     pneumonia  . Zocor [Simvastatin] Other (See Comments)    Itching w/o, muscle problems and it affected his stomach  . Metoprolol     lightheaded    Current Outpatient Prescriptions on File Prior to Visit  Medication Sig Dispense Refill  . aspirin EC 81 MG tablet Take 81 mg by mouth daily.     . Cholecalciferol (VITAMIN D) 1000 UNITS capsule Take 1,000 Units by mouth every morning.     Water engineer Bandages & Supports (ABDOMINAL BINDER/ELASTIC LARGE) MISC Large or appropriate  size Large abdominal hernia 1 each 0  . finasteride (PROSCAR) 5 MG tablet Take 1 tablet (5 mg total) by mouth daily. 90 tablet 3  . losartan (COZAAR) 50 MG tablet Take 1 tablet (50 mg total) by mouth daily. 90 tablet 1  . montelukast (SINGULAIR) 10 MG tablet Take 1 tablet (10 mg total) by mouth at bedtime. (Patient taking differently: Take 10 mg by mouth at bedtime as needed (allergies). ) 30 tablet 1  . Multiple Vitamin (MULTIVITAMIN WITH MINERALS) TABS Take 1 tablet by mouth every morning.     . nystatin-triamcinolone (MYCOLOG II) cream Apply 1 application topically 2 (two) times daily as needed (Applies to red places in groin area.). 30 g 0  . simethicone (MYLICON) 80 MG chewable tablet Chew 1 tablet (80 mg total) by mouth every 6 (six) hours as needed for flatulence. 30 tablet 0  . Timolol Maleate (ISTALOL) 0.5 % (DAILY) SOLN Place 1 drop into the left eye 2 (two) times daily at 8 am and 10 pm.      No current facility-administered medications on file prior to visit.     BP (!) 150/62 (BP Location: Left Arm)   Temp 98.1 F (36.7 C) (Oral)   Ht '5\' 5"'  (1.651 m)   Wt 138 lb (62.6 kg)   BMI 22.96 kg/m       Objective:   Physical Exam  Constitutional: He is oriented to person, place, and time. He appears well-developed and well-nourished. No distress.  Abdominal: Soft. Bowel sounds are normal. He exhibits no distension and no mass. There is no tenderness. There is no rebound and no guarding.  Musculoskeletal:  Large chronic hiatal hernia noted. No inguinal hernia noted.   Neurological: He is alert and oriented to person, place, and time.  Skin: Skin is warm and dry. No rash noted. He is not diaphoretic. No erythema. No pallor.  Psychiatric: He has a normal mood and affect. His behavior is normal. Thought content normal.  Vitals reviewed.     Assessment & Plan:  1. Unilateral inguinal hernia without obstruction or gangrene, recurrence not specified - Advised that they did the right  thing by calling EMS with as much pain as he was in.  - Can add fiber supplement  - Follow up as needed   [name], NP

## 2017-08-13 ENCOUNTER — Ambulatory Visit (INDEPENDENT_AMBULATORY_CARE_PROVIDER_SITE_OTHER): Payer: MEDICARE

## 2017-08-13 VITALS — BP 146/60 | HR 60 | Ht 65.0 in | Wt 139.0 lb

## 2017-08-13 DIAGNOSIS — Z0000 Encounter for general adult medical examination without abnormal findings: Secondary | ICD-10-CM | POA: Diagnosis not present

## 2017-08-13 NOTE — Patient Instructions (Signed)
Jack Noble , Thank you for taking time to come for your Medicare Wellness Visit. I appreciate your ongoing commitment to your health goals. Please review the following plan we discussed and let me know if I can assist you in the future.   These are the goals we discussed: Goals    . patient          Would like to walk a little and maintain his health        This is a list of the screening recommended for you and due dates:  Health Maintenance  Topic Date Due  . Tetanus Vaccine  06/04/2025  . Flu Shot  Completed  . Pneumonia vaccines  Completed    Health Maintenance, Male A healthy lifestyle and preventive care is important for your health and wellness. Ask your health care provider about what schedule of regular examinations is right for you. What should I know about weight and diet? Eat a Healthy Diet  Eat plenty of vegetables, fruits, whole grains, low-fat dairy products, and lean protein.  Do not eat a lot of foods high in solid fats, added sugars, or salt.  Maintain a Healthy Weight Regular exercise can help you achieve or maintain a healthy weight. You should:  Do at least 150 minutes of exercise each week. The exercise should increase your heart rate and make you sweat (moderate-intensity exercise).  Do strength-training exercises at least twice a week.  Watch Your Levels of Cholesterol and Blood Lipids  Have your blood tested for lipids and cholesterol every 5 years starting at 81 years of age. If you are at high risk for heart disease, you should start having your blood tested when you are 81 years old. You may need to have your cholesterol levels checked more often if: ? Your lipid or cholesterol levels are high. ? You are older than 81 years of age. ? You are at high risk for heart disease.  What should I know about cancer screening? Many types of cancers can be detected early and may often be prevented. Lung Cancer  You should be screened every year for lung  cancer if: ? You are a current smoker who has smoked for at least 30 years. ? You are a former smoker who has quit within the past 15 years.  Talk to your health care provider about your screening options, when you should start screening, and how often you should be screened.  Colorectal Cancer  Routine colorectal cancer screening usually begins at 81 years of age and should be repeated every 5-10 years until you are 81 years old. You may need to be screened more often if early forms of precancerous polyps or small growths are found. Your health care provider may recommend screening at an earlier age if you have risk factors for colon cancer.  Your health care provider may recommend using home test kits to check for hidden blood in the stool.  A small camera at the end of a tube can be used to examine your colon (sigmoidoscopy or colonoscopy). This checks for the earliest forms of colorectal cancer.  Prostate and Testicular Cancer  Depending on your age and overall health, your health care provider may do certain tests to screen for prostate and testicular cancer.  Talk to your health care provider about any symptoms or concerns you have about testicular or prostate cancer.  Skin Cancer  Check your skin from head to toe regularly.  Tell your health care provider about  any new moles or changes in moles, especially if: ? There is a change in a mole's size, shape, or color. ? You have a mole that is larger than a pencil eraser.  Always use sunscreen. Apply sunscreen liberally and repeat throughout the day.  Protect yourself by wearing long sleeves, pants, a wide-brimmed hat, and sunglasses when outside.  What should I know about heart disease, diabetes, and high blood pressure?  If you are 92-41 years of age, have your blood pressure checked every 3-5 years. If you are 6 years of age or older, have your blood pressure checked every year. You should have your blood pressure measured  twice-once when you are at a hospital or clinic, and once when you are not at a hospital or clinic. Record the average of the two measurements. To check your blood pressure when you are not at a hospital or clinic, you can use: ? An automated blood pressure machine at a pharmacy. ? A home blood pressure monitor.  Talk to your health care provider about your target blood pressure.  If you are between 40-52 years old, ask your health care provider if you should take aspirin to prevent heart disease.  Have regular diabetes screenings by checking your fasting blood sugar level. ? If you are at a normal weight and have a low risk for diabetes, have this test once every three years after the age of 44. ? If you are overweight and have a high risk for diabetes, consider being tested at a younger age or more often.  A one-time screening for abdominal aortic aneurysm (AAA) by ultrasound is recommended for men aged 24-75 years who are current or former smokers. What should I know about preventing infection? Hepatitis B If you have a higher risk for hepatitis B, you should be screened for this virus. Talk with your health care provider to find out if you are at risk for hepatitis B infection. Hepatitis C Blood testing is recommended for:  Everyone born from [birth year] through 1963.  Anyone with known risk factors for hepatitis C.  Sexually Transmitted Diseases (STDs)  You should be screened each year for STDs including gonorrhea and chlamydia if: ? You are sexually active and are younger than 81 years of age. ? You are older than 81 years of age and your health care provider tells you that you are at risk for this type of infection. ? Your sexual activity has changed since you were last screened and you are at an increased risk for chlamydia or gonorrhea. Ask your health care provider if you are at risk.  Talk with your health care provider about whether you are at high risk of being infected with HIV.  Your health care provider may recommend a prescription medicine to help prevent HIV infection.  What else can I do?  Schedule regular health, dental, and eye exams.  Stay current with your vaccines (immunizations).  Do not use any tobacco products, such as cigarettes, chewing tobacco, and e-cigarettes. If you need help quitting, ask your health care provider.  Limit alcohol intake to no more than 2 drinks per day. One drink equals 12 ounces of beer, 5 ounces of wine, or 1 ounces of hard liquor.  Do not use street drugs.  Do not share needles.  Ask your health care provider for help if you need support or information about quitting drugs.  Tell your health care provider if you often feel depressed.  Tell your health care provider  if you have ever been abused or do not feel safe at home. This information is not intended to replace advice given to you by your health care provider. Make sure you discuss any questions you have with your health care provider. Document Released: 05/07/2008 Document Revised: 07/08/2016 Document Reviewed: 08/13/2015 Elsevier Interactive Patient Education  2018 [place] in the Home Falls can cause injuries and can affect people from all age groups. There are many simple things that you can do to make your home safe and to help prevent falls. What can I do on the outside of my home?  Regularly repair the edges of walkways and driveways and fix any cracks.  Remove high doorway thresholds.  Trim any shrubbery on the main path into your home.  Use bright outdoor lighting.  Clear walkways of debris and clutter, including tools and rocks.  Regularly check that handrails are securely fastened and in good repair. Both sides of any steps should have handrails.  Install guardrails along the edges of any raised decks or porches.  Have leaves, snow, and ice cleared regularly.  Use sand or salt on walkways during winter months.  In  the garage, clean up any spills right away, including grease or oil spills. What can I do in the bathroom?  Use night lights.  Install grab bars by the toilet and in the tub and shower. Do not use towel bars as grab bars.  Use non-skid mats or decals on the floor of the tub or shower.  If you need to sit down while you are in the shower, use a plastic, non-slip stool.  Keep the floor dry. Immediately clean up any water that spills on the floor.  Remove soap buildup in the tub or shower on a regular basis.  Attach bath mats securely with double-sided non-slip rug tape.  Remove throw rugs and other tripping hazards from the floor. What can I do in the bedroom?  Use night lights.  Make sure that a bedside light is easy to reach.  Do not use oversized bedding that drapes onto the floor.  Have a firm chair that has side arms to use for getting dressed.  Remove throw rugs and other tripping hazards from the floor. What can I do in the kitchen?  Clean up any spills right away.  Avoid walking on wet floors.  Place frequently used items in easy-to-reach places.  If you need to reach for something above you, use a sturdy step stool that has a grab bar.  Keep electrical cables out of the way.  Do not use floor polish or wax that makes floors slippery. If you have to use wax, make sure that it is non-skid floor wax.  Remove throw rugs and other tripping hazards from the floor. What can I do in the stairways?  Do not leave any items on the stairs.  Make sure that there are handrails on both sides of the stairs. Fix handrails that are broken or loose. Make sure that handrails are as long as the stairways.  Check any carpeting to make sure that it is firmly attached to the stairs. Fix any carpet that is loose or worn.  Avoid having throw rugs at the top or bottom of stairways, or secure the rugs with carpet tape to prevent them from moving.  Make sure that you have a light  switch at the top of the stairs and the bottom of the stairs. If you do  not have them, have them installed. What are some other fall prevention tips?  Wear closed-toe shoes that fit well and support your feet. Wear shoes that have rubber soles or low heels.  When you use a stepladder, make sure that it is completely opened and that the sides are firmly locked. Have someone hold the ladder while you are using it. Do not climb a closed stepladder.  Add color or contrast paint or tape to grab bars and handrails in your home. Place contrasting color strips on the first and last steps.  Use mobility aids as needed, such as canes, walkers, scooters, and crutches.  Turn on lights if it is dark. Replace any light bulbs that burn out.  Set up furniture so that there are clear paths. Keep the furniture in the same spot.  Fix any uneven floor surfaces.  Choose a carpet design that does not hide the edge of steps of a stairway.  Be aware of any and all pets.  Review your medicines with your healthcare provider. Some medicines can cause dizziness or changes in blood pressure, which increase your risk of falling. Talk with your health care provider about other ways that you can decrease your risk of falls. This may include working with a physical therapist or trainer to improve your strength, balance, and endurance. This information is not intended to replace advice given to you by your health care provider. Make sure you discuss any questions you have with your health care provider. Document Released: 10/30/2002 Document Revised: 04/07/2016 Document Reviewed: 12/14/2014 Elsevier Interactive Patient Education  2017 [employer].

## 2017-08-13 NOTE — Progress Notes (Addendum)
Subjective:   Jack Noble is a 83 y.o. male who presents for Medicare Annual/Subsequent preventive examination.  The Patient was informed that the wellness visit is to identify future health risk and educate and initiate measures that can reduce risk for increased disease through the lifespan.    Annual Wellness Assessment  Reports health as fair Spouse talks about lost son at 11;  Was going to the [employer] He would have been 43 this sept; born in 79  No other children  Church family adopted them as grand parents  Prevent/ive Screening -Counseling & Management  Medicare Annual Preventive Care Visit - Subsequent Last OV 09/05 Has an inoperable hernia per the wife Hx of stroke 2dary to high fever and resultant  aphasia; wife speaks for Jack Noble  On occasion he has pain in groin area due to irreparable herina; it generally reduces on it's on.  Sees Dr. Jannifer Noble for neurology    PSA 04/2011 Colonoscopy 06/2009  VS reviewed;   Diet  Weight fluctuating 140 and 138;  Takes natural alternatives probiotic  Wife cooks Basic nutrients;  Oatmeal and toast for breakfast [place] sandwich and fruit Fish; and vegetable or other full meal for supper    BMI- 23   Exercise  They walk a lot together Has a church family   Dr. Andria Noble  Open angle glaucoma OS ; scratched cornina od Dr. Darlys Gales  No hearing issues      Cardiac Risk Factors Addressed Hyperlipidemia -chol 134; hdl 52; ldl 64 and trig 89   Advanced Directives completed   Patient Care Team: Jack Peng, NP as PCP - General (Family Medicine)    Cardiac Risk Factors include: advanced age (>24men, >54 women);hypertension;male gender     Objective:    Vitals: BP (!) 146/60   Pulse 60   Ht 5\' 5"  (1.651 m)   Wt 139 lb (63 kg)   SpO2 93%   BMI 23.13 kg/m   Body mass index is 23.13 kg/m.  Tobacco History  Smoking Status  . Never Smoker  Smokeless Tobacco  . Never Used     Counseling  given: Yes   Past Medical History:  Diagnosis Date  . Allergy   . Aphasia 12/26/2013   Dr Jack Noble  . Cataracts, bilateral   . Corneal abrasion    Dr. Ned Clines, [facility]  . Dermatophytosis of nail   . Diverticulosis   . Epistaxis   . Glaucoma    Dr. Edilia Bo  . Glaucoma   . Hearing loss   . History of GI diverticular bleed 710/15  . Hyperlipidemia   . Hyperplasia of prostate without urinary obstruction   . Hypertension   . Rosacea    Dr. Marge Duncans  . Sepsis due to Escherichia coli (E. coli) ([place]) 05/2011   Morganella also cultured  . TIA (transient ischemic attack) 9/6-05/2012   BP 221/99   Past Surgical History:  Procedure Laterality Date  . APPENDECTOMY     age 50  . CATARACT EXTRACTION, BILATERAL  2003   Dr Ishmael Holter (now seen @ [place])  . CHOLECYSTECTOMY  1978   stones  . COLONOSCOPY W/ POLYPECTOMY  06/2009   Dr.Jacobs; "miniscule polyp"  . HERNIA REPAIR    . INGUINAL HERNIA REPAIR     age 21  . SKIN BIOPSY  12/2014   done by dermatologist   . TONSILLECTOMY AND ADENOIDECTOMY    . UPPER GI ENDOSCOPY  05/2014   negative   Family  History  Problem Relation Age of Onset  . Stroke Mother 24  . Parkinsonism Brother        Brother died at age at age 101  . Pulmonary embolism Father 38       Post Op  . Diabetes Neg Hx   . Cancer Neg Hx   . COPD Neg Hx   . Heart disease Neg Hx    History  Sexual Activity  . Sexual activity: Not on file    Outpatient Encounter Prescriptions as of 08/13/2017  Medication Sig  . aspirin EC 81 MG tablet Take 81 mg by mouth daily.   . Cholecalciferol (VITAMIN D) 1000 UNITS capsule Take 1,000 Units by mouth every morning.   Water engineer Bandages & Supports (ABDOMINAL BINDER/ELASTIC LARGE) MISC Large or appropriate size Large abdominal hernia  . finasteride (PROSCAR) 5 MG tablet Take 1 tablet (5 mg total) by mouth daily.  Marland Kitchen losartan (COZAAR) 50 MG tablet Take 1 tablet (50 mg total) by mouth daily.  . montelukast (SINGULAIR) 10 MG tablet Take  1 tablet (10 mg total) by mouth at bedtime. (Patient taking differently: Take 10 mg by mouth at bedtime as needed (allergies). )  . Multiple Vitamin (MULTIVITAMIN WITH MINERALS) TABS Take 1 tablet by mouth every morning.   . nystatin-triamcinolone (MYCOLOG II) cream Apply 1 application topically 2 (two) times daily as needed (Applies to red places in groin area.).  Marland Kitchen simethicone (MYLICON) 80 MG chewable tablet Chew 1 tablet (80 mg total) by mouth every 6 (six) hours as needed for flatulence.  . Timolol Maleate (ISTALOL) 0.5 % (DAILY) SOLN Place 1 drop into the left eye 2 (two) times daily at 8 am and 10 pm.    No facility-administered encounter medications on file as of 08/13/2017.     Activities of Daily Living In your present state of health, do you have any difficulty performing the following activities: 08/13/2017  Hearing? N  Vision? Y  Difficulty concentrating or making decisions? Y  Comment difficult to assess; defer to neurology   Walking or climbing stairs? N  Dressing or bathing? N  Doing errands, shopping? N  Preparing Food and eating ? N  Using the Toilet? N  In the past six months, have you accidently leaked urine? N  Do you have problems with loss of bowel control? N  Managing your Medications? N  Managing your Finances? N  Housekeeping or managing your Housekeeping? N  Some recent data might be hidden    Patient Care Team: Jack Peng, NP as PCP - General (Family Medicine)   Assessment:    Exercise Activities and Dietary recommendations Walks with wife if hernia is not bothering him  Current Exercise Habits: Home exercise routine, Type of exercise: walking, Intensity: Mild  Goals    . patient          Would like to walk a little and maintain his health       Fall Risk Fall Risk  08/13/2017 07/17/2016 07/14/2016 07/08/2016 02/06/2015  Falls in the past year? No No Yes Yes No  Comment - Emmi Telephone Survey: data to providers prior to load - - -  Number falls  in past yr: - - - 1 -  Injury with Fall? - - - No -  Risk for fall due to : - - - History of fall(s) -   Depression Screen PHQ 2/9 Scores 08/13/2017 07/14/2016 07/08/2016 02/06/2015  PHQ - 2 Score 0 0 0 0  Cognitive Function MMSE - Mini Mental State Exam 08/13/2017 03/01/2017 09/30/2015  Not completed: Unable to complete - -  Orientation to time - 1 5  Orientation to Place - 0 2  Registration - 2 3  Attention/ Calculation - 0 2  Recall - 0 0  Language- name 2 objects - 2 2  Language- repeat - 0 0  Language- follow 3 step command - 0 1  Language- read & follow direction - 0 1  Write a sentence - 0 1  Copy design - 0 0  Total score - 5 17    Difficulty testing due to aphasia; Deferred to neurology     Immunization History  Administered Date(s) Administered  . Influenza Split 11/03/2011, 08/23/2012  . Influenza, High Dose Seasonal PF 08/15/2014, 07/21/2016, 07/21/2017  . Influenza,inj,Quad PF,6+ Mos 09/01/2013, 08/06/2015  . Pneumococcal Conjugate-13 07/16/2015  . Pneumococcal Polysaccharide-23 01/26/2008  . Tdap 02/15/2004, 06/05/2015   Screening Tests Health Maintenance  Topic Date Due  . TETANUS/TDAP  06/04/2025  . INFLUENZA VACCINE  Completed  . PNA vac Low Risk Adult  Completed      Plan:     PCP Notes   Health Maintenance All health maintenance up to date   Abnormal Screens  BP slightly elevated but wife states he gets nervous when coming to the doctor   Mental status difficult to evaluate. Verbalizes some of his thoughts and most of the time, his wife knew what he was trying to say. Discussed long term care and stated "i have a hard time understanding things anymore". Discussed LTC options in lieu of wife becoming ill etc. Referred to the [place] to increase their options.   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 [facility]  Which may assist with a long term plan; also provides home care as needed for spouse and wife. Wife given point of  entry contact number for the [place] office   Patient concerns; No issues noted today   Nurse Concerns; As noted   Next PCP apt Tbs as needed  Was in to see Talbert Forest 9/5        I have personally reviewed and noted the following in the patient's chart:   . Medical and social history . Use of alcohol, tobacco or illicit drugs  . Current medications and supplements . Functional ability and status . Nutritional status . Physical activity . Advanced directives . List of other physicians . Hospitalizations, surgeries, and ER visits in previous 12 months . Vitals . Screenings to include cognitive, depression, and falls . Referrals and appointments  In addition, I have reviewed and discussed with patient certain preventive protocols, quality metrics, and best practice recommendations. A written personalized care plan for preventive services as well as general preventive health recommendations were provided to patient.     QIONG,EXBMW, RN  08/13/2017    I have reviewed this note and agree with its contents.  Alysia Penna, MD

## 2017-08-24 ENCOUNTER — Telehealth: Payer: Self-pay | Admitting: Neurology

## 2017-08-24 NOTE — Telephone Encounter (Signed)
Patient's wife is calling regarding the patient. He is having spells of anger, confusion and speech problems. She is very concerned and upset.Jack Noble She would like to discuss with Dr. Jannifer Franklin.

## 2017-08-24 NOTE — Telephone Encounter (Signed)
Spoke to patient's wife - he has been added to Dr. Jannifer Franklin' schedule on 09/10/17.

## 2017-08-24 NOTE — Telephone Encounter (Signed)
I called the patient, talk with the wife.The patient has a baseline aphasia,but more recently  He has had some problems with forgetting directions with driving, he has had some problems with irritability.   He likely is developing a memory disorder. Testing his memory may be difficult given his severe aphasia.   The patient will be set up for a revisit, he may require medications for memory and to help with periods of agitation.

## 2017-08-27 ENCOUNTER — Encounter: Payer: Self-pay | Admitting: Adult Health

## 2017-08-27 ENCOUNTER — Ambulatory Visit (INDEPENDENT_AMBULATORY_CARE_PROVIDER_SITE_OTHER): Payer: MEDICARE | Admitting: Adult Health

## 2017-08-27 VITALS — BP 154/70 | Temp 97.9°F | Ht 65.0 in | Wt 137.0 lb

## 2017-08-27 DIAGNOSIS — K4091 Unilateral inguinal hernia, without obstruction or gangrene, recurrent: Secondary | ICD-10-CM | POA: Diagnosis not present

## 2017-08-27 NOTE — Progress Notes (Signed)
Subjective:    Patient ID: Jack Noble, male    DOB: [birth year], 83 y.o.   MRN: [id number]  HPI  83 year old male who presents to the office the office today for  has a past medical history of Allergy; Aphasia (12/26/2013); Cataracts, bilateral; Corneal abrasion; Dermatophytosis of nail; Diverticulosis; Epistaxis; Glaucoma; Glaucoma; Hearing loss; History of GI diverticular bleed (710/15); Hyperlipidemia; Hyperplasia of prostate without urinary obstruction; Hypertension; Rosacea; Sepsis due to Escherichia coli (E. coli) ([place]) (05/2011); and TIA (transient ischemic attack) (9/6-05/2012).  He presents to the office today with his wife for concern of right inguinal hernia. She reports that Jack Noble has bene experiencing episodes of discomfort from his hernia. She reports that pain is before he has a bowel movement. When Jack Noble has a BM the pain goes away and the hernia reduces. Denies any bruising. He has been having bowel movements daily.   No nausea, vomiting, or fevers   Review of Systems See HPI   Past Medical History:  Diagnosis Date  . Allergy   . Aphasia 12/26/2013   Dr Jannifer Franklin  . Cataracts, bilateral   . Corneal abrasion    Dr. Ned Clines, [facility]  . Dermatophytosis of nail   . Diverticulosis   . Epistaxis   . Glaucoma    Dr. Edilia Bo  . Glaucoma   . Hearing loss   . History of GI diverticular bleed 710/15  . Hyperlipidemia   . Hyperplasia of prostate without urinary obstruction   . Hypertension   . Rosacea    Dr. Marge Duncans  . Sepsis due to Escherichia coli (E. coli) ([place]) 05/2011   Morganella also cultured  . TIA (transient ischemic attack) 9/6-05/2012   BP 221/99    Social History   Social History  . Marital status: Married    Spouse name: N/A  . Number of children: 1  . Years of education: college   Occupational History  . [employer] Retired   Social History Main Topics  . Smoking status: Never Smoker  . Smokeless tobacco: Never Used  . Alcohol use No    . Drug use: No  . Sexual activity: Not on file   Other Topics Concern  . Not on file   Social History Narrative   Lives with his wife.     Was in the [employer] for 15 years   Left handed    Past Surgical History:  Procedure Laterality Date  . APPENDECTOMY     age 73  . CATARACT EXTRACTION, BILATERAL  2003   Dr Ishmael Holter (now seen @ [place])  . CHOLECYSTECTOMY  1978   stones  . COLONOSCOPY W/ POLYPECTOMY  06/2009   Dr.Jacobs; "miniscule polyp"  . HERNIA REPAIR    . INGUINAL HERNIA REPAIR     age 21  . SKIN BIOPSY  12/2014   done by dermatologist   . TONSILLECTOMY AND ADENOIDECTOMY    . UPPER GI ENDOSCOPY  05/2014   negative    Family History  Problem Relation Age of Onset  . Stroke Mother 2  . Parkinsonism Brother        Brother died at age at age 28  . Pulmonary embolism Father 60       Post Op  . Diabetes Neg Hx   . Cancer Neg Hx   . COPD Neg Hx   . Heart disease Neg Hx     Allergies  Allergen Reactions  . Amoxicillin     urticaria  .  Codeine     Rash Because of a history of documented adverse serious drug reaction;Medi Alert bracelet  is recommended  . Minocycline Hcl     REACTION: nervous/chest pressure (Note: may have affected GE valve)  . Astelin [Azelastine Hcl]     Makes me jittery  . Azelastine Other (See Comments)  . Ether     pneumonia  . Zocor [Simvastatin] Other (See Comments)    Itching w/o, muscle problems and it affected his stomach  . Metoprolol     lightheaded    Current Outpatient Prescriptions on File Prior to Visit  Medication Sig Dispense Refill  . aspirin EC 81 MG tablet Take 81 mg by mouth daily.     . Cholecalciferol (VITAMIN D) 1000 UNITS capsule Take 1,000 Units by mouth every morning.     Water engineer Bandages & Supports (ABDOMINAL BINDER/ELASTIC LARGE) MISC Large or appropriate size Large abdominal hernia 1 each 0  . finasteride (PROSCAR) 5 MG tablet Take 1 tablet (5 mg total) by mouth daily. 90 tablet 3  . losartan  (COZAAR) 50 MG tablet Take 1 tablet (50 mg total) by mouth daily. 90 tablet 1  . montelukast (SINGULAIR) 10 MG tablet Take 1 tablet (10 mg total) by mouth at bedtime. (Patient taking differently: Take 10 mg by mouth at bedtime as needed (allergies). ) 30 tablet 1  . Multiple Vitamin (MULTIVITAMIN WITH MINERALS) TABS Take 1 tablet by mouth every morning.     . nystatin-triamcinolone (MYCOLOG II) cream Apply 1 application topically 2 (two) times daily as needed (Applies to red places in groin area.). 30 g 0  . simethicone (MYLICON) 80 MG chewable tablet Chew 1 tablet (80 mg total) by mouth every 6 (six) hours as needed for flatulence. 30 tablet 0  . Timolol Maleate (ISTALOL) 0.5 % (DAILY) SOLN Place 1 drop into the left eye 2 (two) times daily at 8 am and 10 pm.      No current facility-administered medications on file prior to visit.     BP (!) 154/70   Temp 97.9 F (36.6 C) (Oral)   Ht '5\' 5"'  (1.651 m)   Wt 137 lb (62.1 kg)   BMI 22.80 kg/m       Objective:   Physical Exam  Constitutional: He appears well-developed and well-nourished. No distress.  Cardiovascular: Normal rate, regular rhythm, normal heart sounds and intact distal pulses.  Exam reveals no gallop.   No murmur heard. Pulmonary/Chest: Effort normal and breath sounds normal. No respiratory distress. He has no wheezes. He has no rales. He exhibits no tenderness.  Abdominal: Bowel sounds are normal. He exhibits no distension and no mass. There is no tenderness. There is no rebound and no guarding. A hernia is present. Hernia confirmed positive in the ventral area and confirmed positive in the right inguinal area.  Neurological: He is alert.  Skin: Skin is warm and dry. No rash noted. He is not diaphoretic. No erythema. No pallor.  Nursing note and vitals reviewed.     Assessment & Plan:  1. Unilateral recurrent inguinal hernia without obstruction or gangrene - Prescription for inguinal hernia belt given  - He is a very  poor surgical candidate  - Advised to follow up in local ER if hernia cannot be reduced, he has bruising to the abdomen, or fever and vomiting   [name], NP

## 2017-09-10 ENCOUNTER — Encounter: Payer: Self-pay | Admitting: Neurology

## 2017-09-10 ENCOUNTER — Ambulatory Visit (INDEPENDENT_AMBULATORY_CARE_PROVIDER_SITE_OTHER): Payer: MEDICARE | Admitting: Neurology

## 2017-09-10 VITALS — BP 161/72 | HR 50 | Ht 65.0 in | Wt 138.5 lb

## 2017-09-10 DIAGNOSIS — R4701 Aphasia: Secondary | ICD-10-CM | POA: Diagnosis not present

## 2017-09-10 MED ORDER — DONEPEZIL HCL 5 MG PO TABS: 5.0000 mg | ORAL_TABLET | Freq: Every day | ORAL | 1 refills | Status: DC

## 2017-09-10 NOTE — Patient Instructions (Signed)
   We will start aricept for the memory.  Begin Aricept (donepezil) at 5 mg at night for one month. If this medication is well-tolerated, please call our office and we will call in a prescription for the 10 mg tablets. Look out for side effects that may include nausea, diarrhea, weight loss, or stomach cramps. This medication will also cause a runny nose, therefore there is no need for allergy medications for this purpose.  

## 2017-09-10 NOTE — Progress Notes (Signed)
Reason for visit: Aphasia  Jack Noble is an 83 y.o. male  History of present illness:  Jack Noble is an 83 year old left-handed white male with a history of a chronic aphasia following an encephalitis event. The patient comes in today as his wife is concerned that he may have some change in memory. He is having some troubles with directions with driving. He requires assistance with keeping up with medications, this is not a new event. He is having some troubles writing checks to pay the bills. The patient will have good days and bad days with cognitive functioning. He comes to the office today for further evaluation. In general, the patient sleeps fairly well at night, he has good energy levels during the day.  Past Medical History:  Diagnosis Date  . Allergy   . Aphasia 12/26/2013   Dr Jannifer Franklin  . Cataracts, bilateral   . Corneal abrasion    Dr. Ned Clines, [facility]  . Dermatophytosis of nail   . Diverticulosis   . Epistaxis   . Glaucoma    Dr. Edilia Bo  . Glaucoma   . Hearing loss   . History of GI diverticular bleed 710/15  . Hyperlipidemia   . Hyperplasia of prostate without urinary obstruction   . Hypertension   . Rosacea    Dr. Marge Duncans  . Sepsis due to Escherichia coli (E. coli) ([place]) 05/2011   Morganella also cultured  . TIA (transient ischemic attack) 9/6-05/2012   BP 221/99    Past Surgical History:  Procedure Laterality Date  . APPENDECTOMY     age 59  . CATARACT EXTRACTION, BILATERAL  2003   Dr Ishmael Holter (now seen @ [place])  . CHOLECYSTECTOMY  1978   stones  . COLONOSCOPY W/ POLYPECTOMY  06/2009   Dr.Jacobs; "miniscule polyp"  . HERNIA REPAIR    . INGUINAL HERNIA REPAIR     age 20  . SKIN BIOPSY  12/2014   done by dermatologist   . TONSILLECTOMY AND ADENOIDECTOMY    . UPPER GI ENDOSCOPY  05/2014   negative    Family History  Problem Relation Age of Onset  . Stroke Mother 53  . Parkinsonism Brother        Brother died at age at age 68  .  Pulmonary embolism Father 41       Post Op  . Diabetes Neg Hx   . Cancer Neg Hx   . COPD Neg Hx   . Heart disease Neg Hx     Social history:  reports that he has never smoked. He has never used smokeless tobacco. He reports that he does not drink alcohol or use drugs.    Allergies  Allergen Reactions  . Amoxicillin     urticaria  . Codeine     Rash Because of a history of documented adverse serious drug reaction;Medi Alert bracelet  is recommended  . Minocycline Hcl     REACTION: nervous/chest pressure (Note: may have affected GE valve)  . Astelin [Azelastine Hcl]     Makes me jittery  . Azelastine Other (See Comments)  . Ether     pneumonia  . Zocor [Simvastatin] Other (See Comments)    Itching w/o, muscle problems and it affected his stomach  . Metoprolol     lightheaded    Medications:  Prior to Admission medications   Medication Sig Start Date End Date Taking? Authorizing Provider  aspirin EC 81 MG tablet Take 81 mg by mouth daily.  Yes [provider]  Cholecalciferol (VITAMIN D) 1000 UNITS capsule Take 1,000 Units by mouth every morning.    Yes [provider]  Elastic Bandages & Supports (ABDOMINAL BINDER/ELASTIC LARGE) MISC Large or appropriate size Large abdominal hernia 08/10/16  Yes Burns, Claudina Lick, MD  finasteride (PROSCAR) 5 MG tablet Take 1 tablet (5 mg total) by mouth daily. 03/04/17  Yes Nafziger, Tommi Rumps, NP  losartan (COZAAR) 50 MG tablet Take 1 tablet (50 mg total) by mouth daily. 07/12/17  Yes Nafziger, Tommi Rumps, NP  montelukast (SINGULAIR) 10 MG tablet Take 1 tablet (10 mg total) by mouth at bedtime. Patient taking differently: Take 10 mg by mouth at bedtime as needed (allergies).  02/06/15  Yes Hendricks Limes, MD  Multiple Vitamin (MULTIVITAMIN WITH MINERALS) TABS Take 1 tablet by mouth every morning.    Yes [provider]  nystatin-triamcinolone (MYCOLOG II) cream Apply 1 application topically 2 (two) times daily as needed  (Applies to red places in groin area.). 04/08/17  Yes Laurey Morale, MD  simethicone (MYLICON) 80 MG chewable tablet Chew 1 tablet (80 mg total) by mouth every 6 (six) hours as needed for flatulence. 06/03/14  Yes Buriev, Arie Sabina, MD  Timolol Maleate (ISTALOL) 0.5 % (DAILY) SOLN Place 1 drop into the left eye 2 (two) times daily at 8 am and 10 pm.    Yes [provider]    ROS:  Out of a complete 14 system review of symptoms, the patient complains only of the following symptoms, and all other reviewed systems are negative.  Eye itching, light sensitivity Cold intolerance Urinary urgency Memory loss, speech difficulty Back pain, neck pain Confusion Moles  Blood pressure (!) 161/72, pulse (!) 50, height 5\' 5"  (1.651 m), weight 138 lb 8 oz (62.8 kg).  Physical Exam  General: The patient is alert and cooperative at the time of the examination.  Skin: No significant peripheral edema is noted.   Neurologic Exam  Mental status: The patient is alert and oriented x 3 at the time of the examination. The patient has apparent normal recent and remote memory, with an apparently normal attention span and concentration ability.   Cranial nerves: Facial symmetry is present. Speech is associated with a significant nonfluent aphasia. Extraocular movements are full. Visual fields are full.  Motor: The patient has good strength in all 4 extremities.  Sensory examination: Soft touch sensation is symmetric on the face, arms, and legs.  Coordination: The patient has good finger-nose-finger and heel-to-shin bilaterally, but the patient does have apraxia with the use of the extremities, particularly with the lower extremities.  Gait and station: The patient has a normal gait. Tandem gait is normal. Romberg is negative. No drift is seen.  Reflexes: Deep tendon reflexes are symmetric.   Assessment/Plan:  1. Chronic aphasia  2. Possible memory disturbance  Following memory function on  this patient is quite difficult as the aphasia is severe. The patient will be placed on low-dose Aricept taking 5 mg daily. They will call if he is having tolerance issues. He will follow-up otherwise in 6 months.  Jill Alexanders MD 09/10/2017 12:06 PM  [facility] [address] [place], [place] [address]  Phone [phone] Fax [phone]

## 2017-09-13 DIAGNOSIS — H40051 Ocular hypertension, right eye: Secondary | ICD-10-CM | POA: Diagnosis not present

## 2017-09-13 DIAGNOSIS — H18453 Nodular corneal degeneration, bilateral: Secondary | ICD-10-CM | POA: Diagnosis not present

## 2017-09-13 DIAGNOSIS — H04123 Dry eye syndrome of bilateral lacrimal glands: Secondary | ICD-10-CM | POA: Diagnosis not present

## 2017-09-13 DIAGNOSIS — H4052X1 Glaucoma secondary to other eye disorders, left eye, mild stage: Secondary | ICD-10-CM | POA: Diagnosis not present

## 2017-09-29 ENCOUNTER — Telehealth: Payer: Self-pay | Admitting: Adult Health

## 2017-09-29 NOTE — Telephone Encounter (Signed)
Error

## 2017-09-30 ENCOUNTER — Encounter: Payer: Self-pay | Admitting: Adult Health

## 2017-09-30 ENCOUNTER — Ambulatory Visit (INDEPENDENT_AMBULATORY_CARE_PROVIDER_SITE_OTHER): Payer: MEDICARE | Admitting: Adult Health

## 2017-09-30 VITALS — BP 146/60 | Temp 97.6°F | Ht 65.0 in | Wt 139.0 lb

## 2017-09-30 DIAGNOSIS — R350 Frequency of micturition: Secondary | ICD-10-CM

## 2017-09-30 LAB — POC URINALSYSI DIPSTICK (AUTOMATED)
Bilirubin, UA: NEGATIVE
Blood, UA: NEGATIVE
GLUCOSE UA: NEGATIVE
Ketones, UA: NEGATIVE
LEUKOCYTES UA: NEGATIVE
NITRITE UA: NEGATIVE
PROTEIN UA: NEGATIVE
Spec Grav, UA: 1.02 (ref 1.010–1.025)
UROBILINOGEN UA: 0.2 U/dL
pH, UA: 6 (ref 5.0–8.0)

## 2017-09-30 NOTE — Progress Notes (Signed)
Subjective:    Patient ID: Jack Noble, male    DOB: January 03, 1929, 83 y.o.   MRN: [id number]  HPI  83 year old male who  has a past medical history of Allergy, Aphasia (12/26/2013), Cataracts, bilateral, Corneal abrasion, Dermatophytosis of nail, Diverticulosis, Epistaxis, Glaucoma, Glaucoma, Hearing loss, History of GI diverticular bleed (710/15), Hyperlipidemia, Hyperplasia of prostate without urinary obstruction, Hypertension, Rosacea, Sepsis due to Escherichia coli (E. coli) ([place]) (05/2011), and TIA (transient ischemic attack) (9/6-05/2012).  He presents to the office today with his wife for the complaint of urinary frequency. This has been a long standing issue for him. His wife brought him into the office today for because he was complaining of it. He denies any pain and his wife has not noticed any blood in his urine.   His wife reports that his he often drinks a lot of fluid when he first wakes up in the morning and his frequently has to go to the bathroom throughout the afternoon. He does not wake often at night to urinate.     Review of Systems See HPI   Past Medical History:  Diagnosis Date  . Allergy   . Aphasia 12/26/2013   Dr Jannifer Franklin  . Cataracts, bilateral   . Corneal abrasion    Dr. Ned Clines, [facility]  . Dermatophytosis of nail   . Diverticulosis   . Epistaxis   . Glaucoma    Dr. Edilia Bo  . Glaucoma   . Hearing loss   . History of GI diverticular bleed 710/15  . Hyperlipidemia   . Hyperplasia of prostate without urinary obstruction   . Hypertension   . Rosacea    Dr. Marge Duncans  . Sepsis due to Escherichia coli (E. coli) ([place]) 05/2011   Morganella also cultured  . TIA (transient ischemic attack) 9/6-05/2012   BP 221/99    Social History   Socioeconomic History  . Marital status: Married    Spouse name: Not on file  . Number of children: 1  . Years of education: college  . Highest education level: Not on file  Social Needs  . Financial resource strain: Not  on file  . Food insecurity - worry: Not on file  . Food insecurity - inability: Not on file  . Transportation needs - medical: Not on file  . Transportation needs - non-medical: Not on file  Occupational History  . Occupation: [employer]    Employer: RETIRED  Tobacco Use  . Smoking status: Never Smoker  . Smokeless tobacco: Never Used  Substance and Sexual Activity  . Alcohol use: No  . Drug use: No  . Sexual activity: Not on file  Other Topics Concern  . Not on file  Social History Narrative   Lives with his wife.     Was in the [employer] for 15 years   Left handed    Past Surgical History:  Procedure Laterality Date  . APPENDECTOMY     age 43  . CATARACT EXTRACTION, BILATERAL  2003   Dr Ishmael Holter (now seen @ [place])  . CHOLECYSTECTOMY  1978   stones  . COLONOSCOPY W/ POLYPECTOMY  06/2009   Dr.Jacobs; "miniscule polyp"  . HERNIA REPAIR    . INGUINAL HERNIA REPAIR     age 50  . SKIN BIOPSY  12/2014   done by dermatologist   . TONSILLECTOMY AND ADENOIDECTOMY    . UPPER GI ENDOSCOPY  05/2014   negative    Family History  Problem  Relation Age of Onset  . Stroke Mother 16  . Parkinsonism Brother        Brother died at age at age 39  . Pulmonary embolism Father 42       Post Op  . Diabetes Neg Hx   . Cancer Neg Hx   . COPD Neg Hx   . Heart disease Neg Hx     Allergies  Allergen Reactions  . Amoxicillin     urticaria  . Codeine     Rash Because of a history of documented adverse serious drug reaction;Medi Alert bracelet  is recommended  . Minocycline Hcl     REACTION: nervous/chest pressure (Note: may have affected GE valve)  . Astelin [Azelastine Hcl]     Makes me jittery  . Azelastine Other (See Comments)  . Ether     pneumonia  . Zocor [Simvastatin] Other (See Comments)    Itching w/o, muscle problems and it affected his stomach  . Metoprolol     lightheaded    Current Outpatient Medications on File Prior to Visit  Medication Sig  Dispense Refill  . aspirin EC 81 MG tablet Take 81 mg by mouth daily.     . Cholecalciferol (VITAMIN D) 1000 UNITS capsule Take 1,000 Units by mouth every morning.     . donepezil (ARICEPT) 5 MG tablet Take 1 tablet (5 mg total) by mouth at bedtime. 30 tablet 1  . Elastic Bandages & Supports (ABDOMINAL BINDER/ELASTIC LARGE) MISC Large or appropriate size Large abdominal hernia 1 each 0  . finasteride (PROSCAR) 5 MG tablet Take 1 tablet (5 mg total) by mouth daily. 90 tablet 3  . losartan (COZAAR) 50 MG tablet Take 1 tablet (50 mg total) by mouth daily. 90 tablet 1  . montelukast (SINGULAIR) 10 MG tablet Take 1 tablet (10 mg total) by mouth at bedtime. (Patient taking differently: Take 10 mg by mouth at bedtime as needed (allergies). ) 30 tablet 1  . Multiple Vitamin (MULTIVITAMIN WITH MINERALS) TABS Take 1 tablet by mouth every morning.     . nystatin-triamcinolone (MYCOLOG II) cream Apply 1 application topically 2 (two) times daily as needed (Applies to red places in groin area.). 30 g 0  . simethicone (MYLICON) 80 MG chewable tablet Chew 1 tablet (80 mg total) by mouth every 6 (six) hours as needed for flatulence. 30 tablet 0  . Timolol Maleate (ISTALOL) 0.5 % (DAILY) SOLN Place 1 drop into the left eye 2 (two) times daily at 8 am and 10 pm.      No current facility-administered medications on file prior to visit.     BP (!) 146/60   Temp 97.6 F (36.4 C) (Oral)   Ht _0  (1.651 m)   Wt 139 lb (63 kg)   BMI 23.13 kg/m       Objective:   Physical Exam  Constitutional: He is oriented to person, place, and time. He appears well-developed and well-nourished. No distress.  Cardiovascular: Normal rate, regular rhythm, normal heart sounds and intact distal pulses. Exam reveals no gallop.  No murmur heard. Pulmonary/Chest: Effort normal and breath sounds normal. No respiratory distress. He has no wheezes. He has no rales. He exhibits no tenderness.  Abdominal: Soft. Bowel sounds are  normal. He exhibits no distension and no mass. There is no tenderness. There is no rebound and no guarding.  Neurological: He is alert and oriented to person, place, and time.  Skin: Skin is warm and dry. No rash  noted. He is not diaphoretic. No erythema. No pallor.  Psychiatric: He has a normal mood and affect. His behavior is normal. Judgment and thought content normal.  Nursing note and vitals reviewed.     Assessment & Plan:  1. Urinary frequency - POCT Urinalysis Dipstick (Automated)- negative  - Space out fluids throughout the morning and afternoon  - Follow up as needed  [name], NP

## 2017-09-30 NOTE — Patient Instructions (Signed)
It was great seeing you today   I am not going to change any of your medications today   Try spacing out your fluids throughout the morning   Follow up as needed

## 2017-10-19 ENCOUNTER — Encounter: Payer: Self-pay | Admitting: Adult Health

## 2017-10-19 ENCOUNTER — Ambulatory Visit (INDEPENDENT_AMBULATORY_CARE_PROVIDER_SITE_OTHER): Payer: MEDICARE | Admitting: Adult Health

## 2017-10-19 VITALS — BP 138/60 | Temp 97.5°F | Wt 140.0 lb

## 2017-10-19 DIAGNOSIS — R410 Disorientation, unspecified: Secondary | ICD-10-CM | POA: Diagnosis not present

## 2017-10-19 LAB — POCT URINALYSIS DIPSTICK
BILIRUBIN UA: NEGATIVE
Blood, UA: NEGATIVE
GLUCOSE UA: NEGATIVE
KETONES UA: NEGATIVE
LEUKOCYTES UA: NEGATIVE
Nitrite, UA: NEGATIVE
PROTEIN UA: NEGATIVE
SPEC GRAV UA: 1.02 (ref 1.010–1.025)
Urobilinogen, UA: 1 E.U./dL
pH, UA: 6 (ref 5.0–8.0)

## 2017-10-19 NOTE — Progress Notes (Signed)
Subjective:    Patient ID: Jack Noble, male    DOB: Feb 16, 1929, 83 y.o.   MRN: [id number]  HPI 83 year old male who  has a past medical history of Allergy, Aphasia (12/26/2013), Cataracts, bilateral, Corneal abrasion, Dermatophytosis of nail, Diverticulosis, Epistaxis, Glaucoma, Glaucoma, Hearing loss, History of GI diverticular bleed (710/15), Hyperlipidemia, Hyperplasia of prostate without urinary obstruction, Hypertension, Rosacea, Sepsis due to Escherichia coli (E. coli) ([place]) (05/2011), and TIA (transient ischemic attack) (9/6-05/2012).  He presents to the office today for an acute issue. His wife reports that this morning after he ate breakfast, Jack Noble was complaining of " feeling warm" and then became slightly confused and had blurred vision. His wife reports that she did not notice a fever or sweating. This episode lasted about 20 minutes and then resolved.   Denies complete loss of vision , slurred speech or facial droop.   Review of Systems See HPI   Past Medical History:  Diagnosis Date  . Allergy   . Aphasia 12/26/2013   Dr Jannifer Franklin  . Cataracts, bilateral   . Corneal abrasion    Dr. Ned Clines, [facility]  . Dermatophytosis of nail   . Diverticulosis   . Epistaxis   . Glaucoma    Dr. Edilia Bo  . Glaucoma   . Hearing loss   . History of GI diverticular bleed 710/15  . Hyperlipidemia   . Hyperplasia of prostate without urinary obstruction   . Hypertension   . Rosacea    Dr. Marge Duncans  . Sepsis due to Escherichia coli (E. coli) ([place]) 05/2011   Morganella also cultured  . TIA (transient ischemic attack) 9/6-05/2012   BP 221/99    Social History   Socioeconomic History  . Marital status: Married    Spouse name: Not on file  . Number of children: 1  . Years of education: college  . Highest education level: Not on file  Social Needs  . Financial resource strain: Not on file  . Food insecurity - worry: Not on file  . Food insecurity - inability: Not on file  .  Transportation needs - medical: Not on file  . Transportation needs - non-medical: Not on file  Occupational History  . Occupation: [employer]    Employer: RETIRED  Tobacco Use  . Smoking status: Never Smoker  . Smokeless tobacco: Never Used  Substance and Sexual Activity  . Alcohol use: No  . Drug use: No  . Sexual activity: Not on file  Other Topics Concern  . Not on file  Social History Narrative   Lives with his wife.     Was in the [employer] for 15 years   Left handed    Past Surgical History:  Procedure Laterality Date  . APPENDECTOMY     age 61  . CATARACT EXTRACTION, BILATERAL  2003   Dr Ishmael Holter (now seen @ [place])  . CHOLECYSTECTOMY  1978   stones  . COLONOSCOPY W/ POLYPECTOMY  06/2009   Dr.Jacobs; "miniscule polyp"  . HERNIA REPAIR    . INGUINAL HERNIA REPAIR     age 75  . SKIN BIOPSY  12/2014   done by dermatologist   . TONSILLECTOMY AND ADENOIDECTOMY    . UPPER GI ENDOSCOPY  05/2014   negative    Family History  Problem Relation Age of Onset  . Stroke Mother 30  . Parkinsonism Brother        Brother died at age at age 70  . Pulmonary  embolism Father 47       Post Op  . Diabetes Neg Hx   . Cancer Neg Hx   . COPD Neg Hx   . Heart disease Neg Hx     Allergies  Allergen Reactions  . Amoxicillin     urticaria  . Codeine     Rash Because of a history of documented adverse serious drug reaction;Medi Alert bracelet  is recommended  . Minocycline Hcl     REACTION: nervous/chest pressure (Note: may have affected GE valve)  . Astelin [Azelastine Hcl]     Makes me jittery  . Azelastine Other (See Comments)  . Ether     pneumonia  . Zocor [Simvastatin] Other (See Comments)    Itching w/o, muscle problems and it affected his stomach  . Metoprolol     lightheaded    Current Outpatient Medications on File Prior to Visit  Medication Sig Dispense Refill  . aspirin EC 81 MG tablet Take 81 mg by mouth daily.     . Cholecalciferol (VITAMIN  D) 1000 UNITS capsule Take 1,000 Units by mouth every morning.     Water engineer Bandages & Supports (ABDOMINAL BINDER/ELASTIC LARGE) MISC Large or appropriate size Large abdominal hernia 1 each 0  . finasteride (PROSCAR) 5 MG tablet Take 1 tablet (5 mg total) by mouth daily. 90 tablet 3  . losartan (COZAAR) 50 MG tablet Take 1 tablet (50 mg total) by mouth daily. 90 tablet 1  . montelukast (SINGULAIR) 10 MG tablet Take 1 tablet (10 mg total) by mouth at bedtime. (Patient taking differently: Take 10 mg by mouth at bedtime as needed (allergies). ) 30 tablet 1  . Multiple Vitamin (MULTIVITAMIN WITH MINERALS) TABS Take 1 tablet by mouth every morning.     . nystatin-triamcinolone (MYCOLOG II) cream Apply 1 application topically 2 (two) times daily as needed (Applies to red places in groin area.). 30 g 0  . simethicone (MYLICON) 80 MG chewable tablet Chew 1 tablet (80 mg total) by mouth every 6 (six) hours as needed for flatulence. 30 tablet 0  . Timolol Maleate (ISTALOL) 0.5 % (DAILY) SOLN Place 1 drop into the left eye 2 (two) times daily at 8 am and 10 pm.     . donepezil (ARICEPT) 5 MG tablet Take 1 tablet (5 mg total) by mouth at bedtime. (Patient not taking: Reported on 10/19/2017) 30 tablet 1   No current facility-administered medications on file prior to visit.     BP 138/60 (BP Location: Left Arm)   Temp (!) 97.5 F (36.4 C) (Oral)   Wt 140 lb (63.5 kg)   BMI 23.30 kg/m       Objective:   Physical Exam  Constitutional: He appears well-developed and well-nourished. No distress.  Eyes: Conjunctivae and EOM are normal. Pupils are equal, round, and reactive to light. Right eye exhibits no discharge. Left eye exhibits no discharge. No scleral icterus.  Neck: No JVD present. Carotid bruit is not present.  Cardiovascular: Normal rate, regular rhythm, normal heart sounds and intact distal pulses. Exam reveals no gallop.  No murmur heard. Pulmonary/Chest: Effort normal and breath sounds  normal. No respiratory distress. He has no wheezes. He has no rales. He exhibits no tenderness.  Neurological: He is alert. No cranial nerve deficit or sensory deficit.  Skin: Skin is warm and dry. No rash noted. He is not diaphoretic. No erythema. No pallor.  Psychiatric: He has a normal mood and affect. His behavior is normal.  Nursing note and vitals reviewed.     Assessment & Plan:  1. Confusion - unsure of cause of confusion. Does not appear as CVA. Will check labs and UA  - POC Urinalysis Dipstick - Basic Metabolic Panel - CBC with Differential/Platelet  [name], NP

## 2017-10-19 NOTE — Patient Instructions (Signed)
I am unsure of what caused your symptoms.   I am going to get routine work and a check your urine for infection

## 2017-10-20 ENCOUNTER — Encounter: Payer: Self-pay | Admitting: Family Medicine

## 2017-10-20 LAB — CBC WITH DIFFERENTIAL/PLATELET
BASOS ABS: 0.1 10*3/uL (ref 0.0–0.1)
BASOS PCT: 2 % (ref 0.0–3.0)
EOS ABS: 0.2 10*3/uL (ref 0.0–0.7)
Eosinophils Relative: 2.3 % (ref 0.0–5.0)
HEMATOCRIT: 39.4 % (ref 39.0–52.0)
HEMOGLOBIN: 13 g/dL (ref 13.0–17.0)
LYMPHS PCT: 27.7 % (ref 12.0–46.0)
Lymphs Abs: 1.9 10*3/uL (ref 0.7–4.0)
MCHC: 33 g/dL (ref 30.0–36.0)
MCV: 99.8 fl (ref 78.0–100.0)
MONOS PCT: 7.9 % (ref 3.0–12.0)
Monocytes Absolute: 0.6 10*3/uL (ref 0.1–1.0)
NEUTROS PCT: 60.1 % (ref 43.0–77.0)
Neutro Abs: 4.2 10*3/uL (ref 1.4–7.7)
Platelets: 218 10*3/uL (ref 150.0–400.0)
RBC: 3.94 Mil/uL — ABNORMAL LOW (ref 4.22–5.81)
RDW: 13.1 % (ref 11.5–15.5)
WBC: 7 10*3/uL (ref 4.0–10.5)

## 2017-10-20 LAB — BASIC METABOLIC PANEL
BUN: 12 mg/dL (ref 6–23)
CALCIUM: 9.7 mg/dL (ref 8.4–10.5)
CHLORIDE: 102 meq/L (ref 96–112)
CO2: 31 mEq/L (ref 19–32)
CREATININE: 0.7 mg/dL (ref 0.40–1.50)
GFR: 113.44 mL/min (ref >60.00)
Glucose, Bld: 102 mg/dL — ABNORMAL HIGH (ref 70–99)
Potassium: 4.2 mEq/L (ref 3.5–5.1)
SODIUM: 138 meq/L (ref 135–145)

## 2017-10-21 ENCOUNTER — Telehealth: Payer: Self-pay | Admitting: Adult Health

## 2017-10-21 NOTE — Telephone Encounter (Signed)
I spoke with patient and went over results.  

## 2017-10-21 NOTE — Telephone Encounter (Signed)
Copied from [place]. Topic: Quick Communication - See Telephone Encounter >> Oct 21, 2017 11:36 AM Bea Graff, NT wrote: CRM for notification. See Telephone encounter for: Pts wife calling to get lab results. Pt will be at the dentist at 3pm.   10/21/17.

## 2017-10-28 ENCOUNTER — Ambulatory Visit: Payer: Self-pay | Admitting: *Deleted

## 2017-10-28 NOTE — Telephone Encounter (Signed)
  Reason for Disposition . Treating constipation with Over-The-Counter (OTC) medicines, questions about . Mild constipation  Answer Assessment - Initial Assessment Questions 1. STOOL PATTERN OR FREQUENCY: "How often do you pass bowel movements (BMs)?"  (Normal range: tid to q 3 days)  "When was the last BM passed?"      Normally goes every day, normal BM on Monday 2. STRAINING: "Do you have to strain to have a BM?"      Yes 3. RECTAL PAIN: "Does your rectum hurt when the stool comes out?" If so, ask: "Do you have hemorrhoids? How bad is the pain?"  (Scale 1-10; or mild, moderate, severe)     Hemorrhoids-March of last year,  4. STOOL COMPOSITION: "Are the stools hard?"      Yes 5. BLOOD ON STOOLS: "Has there been any blood on the toilet tissue or on the surface of the BM?" If so, ask: "When was the last time?"      No 6. CHRONIC CONSTIPATION: "Is this a new problem for you?"  If no, ask: How long have you had this problem?" (days, weeks, months)      Hx of Diverticulosis 7. CHANGES IN DIET: "Have there been any recent changes in your diet?"      Tooth pulled on Monday, so pt has been eating soft foods 8. MEDICATIONS: "Have you been taking any new medications?"     No 9. LAXATIVES: "Have you been using any laxatives or enemas?"  If yes, ask "What, how often, and when was the last time?"     No, just [facility] brand of Colace 10. CAUSE: "What do you think is causing the constipation?"        Not eating properly 11. OTHER SYMPTOMS: "Do you have any other symptoms?" (e.g., abdominal pain, fever, vomiting)       No  Protocols used: CONSTIPATION-A-AH

## 2017-10-29 ENCOUNTER — Ambulatory Visit: Payer: Self-pay | Admitting: *Deleted

## 2017-10-29 NOTE — Telephone Encounter (Signed)
Pt's wife, Joycelyn Schmid, states his "BM has been long and end pieces"; pt's wife states that pt has the urge to go but can't; he has had his stool softeners and prune juice; pt says that she gave the pt 3 stool softeners and prune juice yesterday with no result; she states that she bought dulcolax yesterday but has not given them to him yet and would like to know what to do; pt's wife instructed to give pt 1(one dulcolax) as per nurse triage yesterday 10/28/17; She verbalizes understanding and will call back if the pt's condition worsens or if there is no result from intervention.  Reason for Disposition . Unable to have a bowel movement (BM) without laxative or enema  Answer Assessment - Initial Assessment Questions 1. STOOL PATTERN OR FREQUENCY: "How often do you pass bowel movements (BMs)?"  (Normal range: tid to q 3 days)  "When was the last BM passed?"       Last BM Mon 10/25/17; pt's wife states he normally has a BM daily 2. STRAINING: "Do you have to strain to have a BM?"      sometimes 3. RECTAL PAIN: "Does your rectum hurt when the stool comes out?" If so, ask: "Do you have hemorrhoids? How bad is the pain?"  (Scale 1-10; or mild, moderate, severe)     no 4. STOOL COMPOSITION: "Are the stools hard?"      "in between" 5. BLOOD ON STOOLS: "Has there been any blood on the toilet tissue or on the surface of the BM?" If so, ask: "When was the last time?"      no 6. CHRONIC CONSTIPATION: "Is this a new problem for you?"  If no, ask: How long have you had this problem?" (days, weeks, months)      yes 7. CHANGES IN DIET: "Have there been any recent changes in your diet?"      Yes; soft diet because tooth was pulled  8. MEDICATIONS: "Have you been taking any new medications?"     no 9. LAXATIVES: "Have you been using any laxatives or enemas?"  If yes, ask "What, how often, and when was the last time?"     No; pt has stool softner 10. CAUSE: "What do you think is causing the constipation?"   unknown 11. OTHER SYMPTOMS: "Do you have any other symptoms?" (e.g., abdominal pain, fever, vomiting)       no 12. PREGNANCY: "Is there any chance you are pregnant?" "When was your last menstrual period?"       n/a  Protocols used: CONSTIPATION-A-AH

## 2017-11-07 ENCOUNTER — Emergency Department (HOSPITAL_COMMUNITY)
Admission: EM | Admit: 2017-11-07 | Discharge: 2017-11-07 | Disposition: A | Discharge status: Home / Self Care | Payer: MEDICARE | Attending: Emergency Medicine | Admitting: Emergency Medicine

## 2017-11-07 ENCOUNTER — Emergency Department (HOSPITAL_COMMUNITY): Payer: MEDICARE

## 2017-11-07 ENCOUNTER — Encounter (HOSPITAL_COMMUNITY): Payer: Self-pay | Admitting: Radiology

## 2017-11-07 DIAGNOSIS — Z79899 Other long term (current) drug therapy: Secondary | ICD-10-CM | POA: Diagnosis not present

## 2017-11-07 DIAGNOSIS — Z8673 Personal history of transient ischemic attack (TIA), and cerebral infarction without residual deficits: Secondary | ICD-10-CM | POA: Diagnosis not present

## 2017-11-07 DIAGNOSIS — K432 Incisional hernia without obstruction or gangrene: Secondary | ICD-10-CM | POA: Diagnosis not present

## 2017-11-07 DIAGNOSIS — Z7982 Long term (current) use of aspirin: Secondary | ICD-10-CM | POA: Insufficient documentation

## 2017-11-07 DIAGNOSIS — I10 Essential (primary) hypertension: Secondary | ICD-10-CM | POA: Diagnosis not present

## 2017-11-07 DIAGNOSIS — R1084 Generalized abdominal pain: Secondary | ICD-10-CM | POA: Diagnosis not present

## 2017-11-07 DIAGNOSIS — K469 Unspecified abdominal hernia without obstruction or gangrene: Secondary | ICD-10-CM | POA: Diagnosis present

## 2017-11-07 DIAGNOSIS — R109 Unspecified abdominal pain: Secondary | ICD-10-CM | POA: Diagnosis not present

## 2017-11-07 LAB — COMPREHENSIVE METABOLIC PANEL
ALBUMIN: 3.7 g/dL (ref 3.5–5.0)
ALK PHOS: 54 U/L (ref 38–126)
ALT: 15 U/L — AB (ref 17–63)
AST: 24 U/L (ref 15–41)
Anion gap: 5 (ref 5–15)
BILIRUBIN TOTAL: 0.7 mg/dL (ref 0.3–1.2)
BUN: 12 mg/dL (ref 6–20)
CALCIUM: 9.1 mg/dL (ref 8.9–10.3)
CO2: 26 mmol/L (ref 22–32)
CREATININE: 0.61 mg/dL (ref 0.61–1.24)
Chloride: 105 mmol/L (ref 101–111)
GFR calc Af Amer: >60 mL/min (ref >60)
GLUCOSE: 111 mg/dL — AB (ref 65–99)
Potassium: 3.8 mmol/L (ref 3.5–5.1)
Sodium: 136 mmol/L (ref 135–145)
TOTAL PROTEIN: 6.6 g/dL (ref 6.5–8.1)

## 2017-11-07 LAB — CBC WITH DIFFERENTIAL/PLATELET
BASOS ABS: 0 10*3/uL (ref 0.0–0.1)
BASOS PCT: 0 %
Eosinophils Absolute: 0.1 10*3/uL (ref 0.0–0.7)
Eosinophils Relative: 1 %
HEMATOCRIT: 38.8 % — AB (ref 39.0–52.0)
HEMOGLOBIN: 13.1 g/dL (ref 13.0–17.0)
LYMPHS PCT: 11 %
Lymphs Abs: 1.4 10*3/uL (ref 0.7–4.0)
MCH: 32.7 pg (ref 26.0–34.0)
MCHC: 33.8 g/dL (ref 30.0–36.0)
MCV: 96.8 fL (ref 78.0–100.0)
MONO ABS: 0.5 10*3/uL (ref 0.1–1.0)
Monocytes Relative: 4 %
NEUTROS ABS: 9.9 10*3/uL — AB (ref 1.7–7.7)
NEUTROS PCT: 84 %
Platelets: 186 10*3/uL (ref 150–400)
RBC: 4.01 MIL/uL — ABNORMAL LOW (ref 4.22–5.81)
RDW: 12.8 % (ref 11.5–15.5)
WBC: 11.9 10*3/uL — ABNORMAL HIGH (ref 4.0–10.5)

## 2017-11-07 LAB — URINALYSIS, ROUTINE W REFLEX MICROSCOPIC
Bilirubin Urine: NEGATIVE
GLUCOSE, UA: NEGATIVE mg/dL
HGB URINE DIPSTICK: NEGATIVE
KETONES UR: NEGATIVE mg/dL
Leukocytes, UA: NEGATIVE
Nitrite: NEGATIVE
PH: 8 (ref 5.0–8.0)
PROTEIN: NEGATIVE mg/dL
Specific Gravity, Urine: 1.011 (ref 1.005–1.030)

## 2017-11-07 LAB — LIPASE, BLOOD: LIPASE: 29 U/L (ref 11–51)

## 2017-11-07 MED ORDER — IOPAMIDOL (ISOVUE-300) INJECTION 61%
INTRAVENOUS | Status: AC
  Administered 2017-11-07: 100 mL via INTRAVENOUS
  Filled 2017-11-07: qty 100

## 2017-11-07 MED ORDER — IOPAMIDOL (ISOVUE-300) INJECTION 61%
100.0000 mL | Freq: Once | INTRAVENOUS | Status: AC | PRN
  Administered 2017-11-07: 100 mL via INTRAVENOUS

## 2017-11-07 NOTE — ED Triage Notes (Signed)
Per EMS, pt has hernia.  Pt has been told that hernia cannot be repaired.  Pt pain started this morning.  Pt was removing dishes from dishwasher.  Pt has aphasia at baseline. Some dementia.   Vitals: 128/78, hr 82, resp 16-18,

## 2017-11-07 NOTE — Discharge Instructions (Signed)

## 2017-11-07 NOTE — ED Notes (Signed)
Bed: WA06 Expected date:  Expected time:  Means of arrival:  Comments: abd pain 

## 2017-11-07 NOTE — ED Provider Notes (Signed)
  Physical Exam  BP (!) 122/58   Pulse (!) 57   Temp 97.6 F (36.4 C) (Oral)   Resp 20   SpO2 94%   Physical Exam  Abdominal: There is no tenderness. There is no rigidity, no rebound and no guarding. A hernia is present. Hernia confirmed positive in the ventral area.    ED Course/Procedures     Procedures  MDM  I assumed care of this patient from Dr. Billy Fischer at [phone].  Please see their note for further details of Hx, PE.  Briefly patient is a 83 y.o. male who presented with 2 hours of severe abdominal pain that has now resolved.  Patient has a history of ventral hernia.  Abdomen is benign but is currently pending labs and imaging to assess for possible obstruction.   Labs do reveal mild leukocytosis otherwise grossly reassuring.  CT scan revealed a large ventral hernia without evidence of obstruction.  No other evidence to suggest serious intra-abdominal inflammatory/infectious process.  On reexamination patient had a benign abdomen.  He was able to tolerate oral hydration.  The patient appears reasonably screened and/or stabilized for discharge and I doubt any other medical condition or other [facility] requiring further screening, evaluation, or treatment in the ED at this time prior to discharge.  The patient is safe for discharge with strict return precautions.  Final diagnoses:  Generalized abdominal pain  Ventral hernia, recurrent    Disposition: Discharge  Condition: Good  I have discussed the results, Dx and Tx plan with the patient and family who expressed understanding and agree(s) with the plan. Discharge instructions discussed at great length. The patient and family were given strict return precautions who verbalized understanding of the instructions. No further questions at time of discharge.    ED Discharge Orders    None       Follow Up: [name], NP [place] [address] [phone]  Schedule an appointment as soon as  possible for a visit  As needed          Mariha Sleeper, Grayce Sessions, MD 11/07/17 [phone]

## 2017-11-07 NOTE — ED Provider Notes (Signed)
[facility] DEPT Provider Note   CSN: [id number] Arrival date & time: 11/07/17  1401     History   Chief Complaint Chief Complaint  Patient presents with  . Hernia    HPI Jack Noble is a 83 y.o. male.  HPI   83yo male presents with concern for abdominal pain.  Has had history of hernias, with intermittent episodes of mild pain that resolve spontaneously or when he has bowel movement, and are thought to be due to constipation. Today, after breakfast, he suddenly developed severe abdominal pain. He was doubled over in pain, however was unable to localize it.  His wife reports she has never seen him with pain that severe. She at first thought it was gas, and gave him gas-ex. He had a normal bowel movement, however after this continued to have severe pain. Wife noted that his right inguinal hernia was protruding and was not going back in.  He did not have nausea, vomiting, fever, or urinary symptoms.  On arrival to the ED, reported significant improvement in pain, and now appears comfortable.  History taken from wife. Patient has significant aphasia limiting history.    Past Medical History:  Diagnosis Date  . Allergy   . Aphasia 12/26/2013   Dr Jannifer Franklin  . Cataracts, bilateral   . Corneal abrasion    Dr. Ned Clines, [facility]  . Dermatophytosis of nail   . Diverticulosis   . Epistaxis   . Glaucoma    Dr. Edilia Bo  . Glaucoma   . Hearing loss   . History of GI diverticular bleed 710/15  . Hyperlipidemia   . Hyperplasia of prostate without urinary obstruction   . Hypertension   . Rosacea    Dr. Marge Duncans  . Sepsis due to Escherichia coli (E. coli) ([place]) 05/2011   Morganella also cultured  . TIA (transient ischemic attack) 9/6-05/2012   BP 221/99    Patient Active Problem List   Diagnosis Date Noted  . Constipation 11/09/2016  . Transient confusion 09/24/2016  . Ventral hernia without obstruction or gangrene 08/10/2016  . Bilateral inguinal  hernia 04/06/2016  . External hemorrhoid 02/25/2016  . Confusion 02/25/2016  . Poor balance 02/05/2016  . Right knee pain 02/05/2016  . Essential hypertension, benign 12/26/2015  . Fatigue 09/19/2015  . GERD (gastroesophageal reflux disease) 06/06/2015  . Skin cancer 09/14/2014  . History of GI diverticular bleed 06/08/2014  . Aphasia 12/26/2013  . History of short term memory loss 09/01/2013  . Hypoalbuminemia 08/04/2013  . Severe sepsis ([place]) 07/24/2013  . BPH (benign prostatic hyperplasia) 07/24/2013  . Glaucoma   . DIVERTICULOSIS, COLON 07/17/2009  . COLONIC POLYPS, HX OF 07/17/2009  . ALLERGIC RHINITIS 10/16/2008  . LIPOMAS, MULTIPLE 11/04/2007  . Rosacea 05/12/2007    Past Surgical History:  Procedure Laterality Date  . APPENDECTOMY     age 89  . CATARACT EXTRACTION, BILATERAL  2003   Dr Ishmael Holter (now seen @ [place])  . CHOLECYSTECTOMY  1978   stones  . COLONOSCOPY W/ POLYPECTOMY  06/2009   Dr.Jacobs; "miniscule polyp"  . HERNIA REPAIR    . INGUINAL HERNIA REPAIR     age 57  . SKIN BIOPSY  12/2014   done by dermatologist   . TONSILLECTOMY AND ADENOIDECTOMY    . UPPER GI ENDOSCOPY  05/2014   negative       Home Medications    Prior to Admission medications   Medication Sig Start Date End Date Taking?  Authorizing Provider  aspirin EC 81 MG tablet Take 81 mg by mouth daily.    Yes [provider]  Cholecalciferol (VITAMIN D) 1000 UNITS capsule Take 1,000 Units by mouth every morning.    Yes [provider]  finasteride (PROSCAR) 5 MG tablet Take 1 tablet (5 mg total) by mouth daily. 03/04/17  Yes Nafziger, Tommi Rumps, NP  losartan (COZAAR) 50 MG tablet Take 1 tablet (50 mg total) by mouth daily. 07/12/17  Yes Nafziger, Tommi Rumps, NP  Multiple Vitamin (MULTIVITAMIN WITH MINERALS) TABS Take 1 tablet by mouth every morning.    Yes [provider]  nystatin-triamcinolone (MYCOLOG II) cream Apply 1 application topically 2 (two) times daily as needed  (Applies to red places in groin area.). 04/08/17  Yes Laurey Morale, MD  simethicone (MYLICON) 80 MG chewable tablet Chew 1 tablet (80 mg total) by mouth every 6 (six) hours as needed for flatulence. 06/03/14  Yes Buriev, Arie Sabina, MD  Timolol Maleate (ISTALOL) 0.5 % (DAILY) SOLN Place 1 drop into both eyes 2 (two) times daily at 8 am and 10 pm.    Yes [provider]  donepezil (ARICEPT) 5 MG tablet Take 1 tablet (5 mg total) by mouth at bedtime. Patient not taking: Reported on 10/19/2017 09/10/17   Kathrynn Ducking, MD  Elastic Bandages & Supports (ABDOMINAL BINDER/ELASTIC LARGE) MISC Large or appropriate size Large abdominal hernia 08/10/16   Binnie Rail, MD    Family History Family History  Problem Relation Age of Onset  . Stroke Mother 49  . Parkinsonism Brother        Brother died at age at age 64  . Pulmonary embolism Father 76       Post Op  . Diabetes Neg Hx   . Cancer Neg Hx   . COPD Neg Hx   . Heart disease Neg Hx     Social History Social History   Tobacco Use  . Smoking status: Never Smoker  . Smokeless tobacco: Never Used  Substance Use Topics  . Alcohol use: No  . Drug use: No     Allergies   Amoxicillin; Codeine; Minocycline hcl; Astelin [azelastine hcl]; Azelastine; Ether; Zocor [simvastatin]; and Metoprolol   Review of Systems Review of Systems  Constitutional: Negative for fever.  HENT: Negative for sore throat.   Eyes: Negative for visual disturbance.  Respiratory: Negative for shortness of breath.   Cardiovascular: Negative for chest pain.  Gastrointestinal: Positive for abdominal pain and constipation. Negative for diarrhea and nausea.  Genitourinary: Negative for difficulty urinating and dysuria.  Musculoskeletal: Negative for back pain and neck stiffness.  Skin: Negative for rash.  Neurological: Negative for syncope and headaches.     Physical Exam Updated Vital Signs BP (!) 122/58   Pulse (!) 57   Temp 97.6 F (36.4 C)  (Oral)   Resp 20   SpO2 94%   Physical Exam  Constitutional: He appears well-developed and well-nourished. No distress.  HENT:  Head: Normocephalic and atraumatic.  Eyes: Conjunctivae and EOM are normal.  Neck: Normal range of motion.  Cardiovascular: Normal rate, regular rhythm, normal heart sounds and intact distal pulses. Exam reveals no gallop and no friction rub.  No murmur heard. Pulmonary/Chest: Effort normal and breath sounds normal. No respiratory distress. He has no wheezes. He has no rales.  Abdominal: Soft. He exhibits no distension. There is no tenderness. There is no guarding.  Large ventral hernia, reducible No protrusion of right inguinal hernia Denies significant  tenderness at this time  Musculoskeletal: He exhibits no edema.  Neurological: He is alert.  aphasia  Skin: Skin is warm and dry. He is not diaphoretic.  Nursing note and vitals reviewed.    ED Treatments / Results  Labs (all labs ordered are listed, but only abnormal results are displayed) Labs Reviewed  CBC WITH DIFFERENTIAL/PLATELET - Abnormal; Notable for the following components:      Result Value   WBC 11.9 (*)    RBC 4.01 (*)    HCT 38.8 (*)    Neutro Abs 9.9 (*)    All other components within normal limits  COMPREHENSIVE METABOLIC PANEL - Abnormal; Notable for the following components:   Glucose, Bld 111 (*)    ALT 15 (*)    All other components within normal limits  URINALYSIS, ROUTINE W REFLEX MICROSCOPIC - Abnormal; Notable for the following components:   APPearance HAZY (*)    All other components within normal limits  URINE CULTURE  LIPASE, BLOOD    EKG  EKG Interpretation None       Radiology Ct Abdomen Pelvis W Contrast  Result Date: 11/07/2017 CLINICAL DATA:  Abdominal pain.  Suspect diverticulitis. EXAM: CT ABDOMEN AND PELVIS WITH CONTRAST TECHNIQUE: Multidetector CT imaging of the abdomen and pelvis was performed using the standard protocol following bolus  administration of intravenous contrast. CONTRAST:  175mL ISOVUE-300 IOPAMIDOL (ISOVUE-300) INJECTION 61% COMPARISON:  05/13/2015 FINDINGS: Lower chest: No acute abnormality. Hepatobiliary: No focal liver abnormality is seen. Status post cholecystectomy. No biliary dilatation. Pancreas: Unremarkable. No pancreatic ductal dilatation or surrounding inflammatory changes. Spleen: Normal in size without focal abnormality. Adrenals/Urinary Tract: The adrenal glands appear normal. Unremarkable appearance of the kidneys. Urinary bladder appears normal. Stomach/Bowel: Stomach is normal. The small bowel loops are normal in caliber. No bowel wall thickening or inflammation. Numerous colonic diverticula are identified. There is a large ventral abdominal wall hernia which contains a nonobstructed loop of transverse colon and omental fat. No findings to suggest acute diverticulitis. Vascular/Lymphatic: Aortic atherosclerosis. No aneurysm. No upper abdominal adenopathy. There is no pelvic or inguinal adenopathy. Reproductive: Prostate is unremarkable. Other: There is a small volume of fluid identified overlying the dome of the liver, image 18 of series 5. There is a right inguinal hernia which also contains a small volume of fluid, image 26 of series 5. Musculoskeletal: No acute or significant osseous findings. IMPRESSION: 1. Extensive colonic diverticulosis without evidence for acute diverticulitis. 2. Large ventral abdominal wall hernia which contains a nonobstructed loop of transverse colon and omental fat. 3. Small amount of free fluid is identified overlying the dome of liver and in right inguinal hernia. 4.  Aortic Atherosclerosis (ICD10-I70.0). Electronically Signed   By: Kerby Moors M.D.   On: 11/07/2017 17:45    Procedures Procedures (including critical care time)  Medications Ordered in ED Medications  iopamidol (ISOVUE-300) 61 % injection 100 mL (100 mLs Intravenous Contrast Given 11/07/17 1718)      Initial Impression / Assessment and Plan / ED Course  I have reviewed the triage vital signs and the nursing notes.  Pertinent labs & imaging results that were available during my care of the patient were reviewed by me and considered in my medical decision making (see chart for details).     83yo male with history above including aphasia, htn, hlpd, diverticuli, hernias, presents with concern for abdominal pain.  No sign of incarcerated hernia on exam. Patient denies pain at this time, however given age, severity of  prior pain, limited history by aphasia, will obtain labs and CT abdomen pelvis for further evaluation, to evaluate for signs of obstruction, diverticulitis or other.   Labs, urine and CT pending at time of transfer of care. If no acute findings, plan to discharge with outpatient follow up.  Final Clinical Impressions(s) / ED Diagnoses   Final diagnoses:  Hernia of abdominal cavity  Generalized abdominal pain    ED Discharge Orders    None       Gareth Morgan, MD 11/07/17 [phone]

## 2017-11-08 LAB — URINE CULTURE

## 2017-11-10 ENCOUNTER — Encounter: Payer: Self-pay | Admitting: Adult Health

## 2017-11-10 ENCOUNTER — Ambulatory Visit (INDEPENDENT_AMBULATORY_CARE_PROVIDER_SITE_OTHER): Payer: MEDICARE | Admitting: Adult Health

## 2017-11-10 VITALS — BP 126/58 | Temp 97.5°F | Wt 139.0 lb

## 2017-11-10 DIAGNOSIS — D72829 Elevated white blood cell count, unspecified: Secondary | ICD-10-CM | POA: Diagnosis not present

## 2017-11-10 DIAGNOSIS — K5900 Constipation, unspecified: Secondary | ICD-10-CM

## 2017-11-10 DIAGNOSIS — R1084 Generalized abdominal pain: Secondary | ICD-10-CM | POA: Diagnosis not present

## 2017-11-10 LAB — CBC WITH DIFFERENTIAL/PLATELET
Basophils Absolute: 0 10*3/uL (ref 0.0–0.1)
Basophils Relative: 0.7 % (ref 0.0–3.0)
EOS PCT: 3.2 % (ref 0.0–5.0)
Eosinophils Absolute: 0.2 10*3/uL (ref 0.0–0.7)
HCT: 39 % (ref 39.0–52.0)
HEMOGLOBIN: 13 g/dL (ref 13.0–17.0)
LYMPHS PCT: 33.5 % (ref 12.0–46.0)
Lymphs Abs: 2 10*3/uL (ref 0.7–4.0)
MCHC: 33.3 g/dL (ref 30.0–36.0)
MCV: 99 fl (ref 78.0–100.0)
MONOS PCT: 7.4 % (ref 3.0–12.0)
Monocytes Absolute: 0.4 10*3/uL (ref 0.1–1.0)
Neutro Abs: 3.3 10*3/uL (ref 1.4–7.7)
Neutrophils Relative %: 55.2 % (ref 43.0–77.0)
Platelets: 203 10*3/uL (ref 150.0–400.0)
RBC: 3.94 Mil/uL — AB (ref 4.22–5.81)
RDW: 12.8 % (ref 11.5–15.5)
WBC: 6.1 10*3/uL (ref 4.0–10.5)

## 2017-11-10 MED ORDER — LACTULOSE 10 GM/15ML PO SOLN: 10.0000 g | Freq: Every day | ORAL | 3 refills | Status: DC

## 2017-11-10 NOTE — Progress Notes (Signed)
Subjective:    Patient ID: Jack Noble, male    DOB: 07/03/1929, 83 y.o.   MRN: [id number]  HPI 83 year old male who  has a past medical history of Allergy, Aphasia (12/26/2013), Cataracts, bilateral, Corneal abrasion, Dermatophytosis of nail, Diverticulosis, Epistaxis, Glaucoma, Glaucoma, Hearing loss, History of GI diverticular bleed (710/15), Hyperlipidemia, Hyperplasia of prostate without urinary obstruction, Hypertension, Rosacea, Sepsis due to Escherichia coli (E. coli) ([place]) (05/2011), and TIA (transient ischemic attack) (9/6-05/2012).  He was seen in the ER two nights ago for abdominal pain. Per ER note:    Has had history of hernias, with intermittent episodes of mild pain that resolve spontaneously or when he has bowel movement, and are thought to be due to constipation. Today, after breakfast, he suddenly developed severe abdominal pain. He was doubled over in pain, however was unable to localize it.  His wife reports she has never seen him with pain that severe. She at first thought it was gas, and gave him gas-ex. He had a normal bowel movement, however after this continued to have severe pain. Wife noted that his right inguinal hernia was protruding and was not going back in.  He did not have nausea, vomiting, fever, or urinary symptoms.  On arrival to the ED, reported significant improvement in pain, and now appears comfortable.  History taken from wife. Patient has significant aphasia limiting history.    CT abdomen was done which showed:  IMPRESSION: 1. Extensive colonic diverticulosis without evidence for acute diverticulitis. 2. Large ventral abdominal wall hernia which contains a nonobstructed loop of transverse colon and omental fat. 3. Small amount of free fluid is identified overlying the dome of liver and in right inguinal hernia. 4.  Aortic Atherosclerosis (ICD10-I70.0).  Labs were normal except for mild leukocytosis.   He returns today with his wife who  provides most of the history. She reports that Jack Noble has not complained of any abdominal pain since being discharged from the hospital. He continues to have constipation for which he is been using prune juice and a stool softener, they have not noticed significant difference in bowel movements. He denies any fevers, chest pain, SOB, or abdominal pain    Review of Systems See HPI   Past Medical History:  Diagnosis Date  . Allergy   . Aphasia 12/26/2013   Dr Jannifer Franklin  . Cataracts, bilateral   . Corneal abrasion    Dr. Ned Clines, [facility]  . Dermatophytosis of nail   . Diverticulosis   . Epistaxis   . Glaucoma    Dr. Edilia Bo  . Glaucoma   . Hearing loss   . History of GI diverticular bleed 710/15  . Hyperlipidemia   . Hyperplasia of prostate without urinary obstruction   . Hypertension   . Rosacea    Dr. Marge Duncans  . Sepsis due to Escherichia coli (E. coli) ([place]) 05/2011   Morganella also cultured  . TIA (transient ischemic attack) 9/6-05/2012   BP 221/99    Social History   Socioeconomic History  . Marital status: Married    Spouse name: Not on file  . Number of children: 1  . Years of education: college  . Highest education level: Not on file  Social Needs  . Financial resource strain: Not on file  . Food insecurity - worry: Not on file  . Food insecurity - inability: Not on file  . Transportation needs - medical: Not on file  . Transportation needs - non-medical: Not on file  Occupational History  . Occupation: [employer]    Employer: RETIRED  Tobacco Use  . Smoking status: Never Smoker  . Smokeless tobacco: Never Used  Substance and Sexual Activity  . Alcohol use: No  . Drug use: No  . Sexual activity: Not on file  Other Topics Concern  . Not on file  Social History Narrative   Lives with his wife.     Was in the [employer] for 15 years   Left handed    Past Surgical History:  Procedure Laterality Date  . APPENDECTOMY     age 67  . CATARACT  EXTRACTION, BILATERAL  2003   Dr Ishmael Holter (now seen @ [place])  . CHOLECYSTECTOMY  1978   stones  . COLONOSCOPY W/ POLYPECTOMY  06/2009   Dr.Jacobs; "miniscule polyp"  . HERNIA REPAIR    . INGUINAL HERNIA REPAIR     age 41  . SKIN BIOPSY  12/2014   done by dermatologist   . TONSILLECTOMY AND ADENOIDECTOMY    . UPPER GI ENDOSCOPY  05/2014   negative    Family History  Problem Relation Age of Onset  . Stroke Mother 66  . Parkinsonism Brother        Brother died at age at age 14  . Pulmonary embolism Father 48       Post Op  . Diabetes Neg Hx   . Cancer Neg Hx   . COPD Neg Hx   . Heart disease Neg Hx     Allergies  Allergen Reactions  . Amoxicillin     urticaria  . Codeine     Rash Because of a history of documented adverse serious drug reaction;Medi Alert bracelet  is recommended  . Minocycline Hcl     REACTION: nervous/chest pressure (Note: may have affected GE valve)  . Astelin [Azelastine Hcl]     Makes me jittery  . Azelastine Other (See Comments)  . Ether     pneumonia  . Zocor [Simvastatin] Other (See Comments)    Itching w/o, muscle problems and it affected his stomach  . Metoprolol     lightheaded    Current Outpatient Medications on File Prior to Visit  Medication Sig Dispense Refill  . aspirin EC 81 MG tablet Take 81 mg by mouth daily.     . Cholecalciferol (VITAMIN D) 1000 UNITS capsule Take 1,000 Units by mouth every morning.     . donepezil (ARICEPT) 5 MG tablet Take 1 tablet (5 mg total) by mouth at bedtime. 30 tablet 1  . Elastic Bandages & Supports (ABDOMINAL BINDER/ELASTIC LARGE) MISC Large or appropriate size Large abdominal hernia 1 each 0  . finasteride (PROSCAR) 5 MG tablet Take 1 tablet (5 mg total) by mouth daily. 90 tablet 3  . losartan (COZAAR) 50 MG tablet Take 1 tablet (50 mg total) by mouth daily. 90 tablet 1  . Multiple Vitamin (MULTIVITAMIN WITH MINERALS) TABS Take 1 tablet by mouth every morning.     . nystatin-triamcinolone  (MYCOLOG II) cream Apply 1 application topically 2 (two) times daily as needed (Applies to red places in groin area.). 30 g 0  . simethicone (MYLICON) 80 MG chewable tablet Chew 1 tablet (80 mg total) by mouth every 6 (six) hours as needed for flatulence. 30 tablet 0  . Timolol Maleate (ISTALOL) 0.5 % (DAILY) SOLN Place 1 drop into both eyes 2 (two) times daily at 8 am and 10 pm.      No current facility-administered  medications on file prior to visit.     BP (!) 126/58 (BP Location: Left Arm)   Temp (!) 97.5 F (36.4 C) (Oral)   Wt 139 lb (63 kg)   BMI 23.13 kg/m       Objective:   Physical Exam  Constitutional: He is oriented to person, place, and time. He appears well-developed and well-nourished. No distress.  Cardiovascular: Normal rate, regular rhythm, normal heart sounds and intact distal pulses. Exam reveals no gallop and no friction rub.  No murmur heard. Pulmonary/Chest: Effort normal and breath sounds normal. No respiratory distress. He has no wheezes. He has no rales. He exhibits no tenderness.  Abdominal: Soft. Bowel sounds are normal. He exhibits no distension and no mass. There is no tenderness. There is no rebound and no guarding.  Large ventral hernia, reducible No protrusion of right inguinal hernia     Neurological: He is alert and oriented to person, place, and time.  Skin: Skin is warm and dry. No rash noted. He is not diaphoretic. No erythema. No pallor.  Psychiatric: He has a normal mood and affect. His behavior is normal. Judgment and thought content normal.  Nursing note and vitals reviewed.     Assessment & Plan:  - Will prescribe Lactulose daily to help with constipation. Can stop stool softeners at home. Will check CBC in office due to leukocytosis in the hospital.  Follow up as needed  [name], NP

## 2017-11-10 NOTE — Patient Instructions (Signed)
It was great seeing you today   I am going to get some blood work to make sure everything is ok   For the constipation, I have sent in Lactulose, you will take this daily. You can stop the stool softener.   Increase your water.   Please let me know if you need anything

## 2017-11-11 ENCOUNTER — Telehealth: Payer: Self-pay | Admitting: Adult Health

## 2017-11-11 NOTE — Telephone Encounter (Signed)
[place]  About  Jack Noble  By his provider  For  Constipation .   His  Wife  Has  Not  Started  The  Lactulose  Yet   Unclear  As  Why not  She  Has  Not  Started  The  meds  Yet . She  Denies  Any new   Symptoms   Plan of  Care   Discussed   With    Wife   . Wife  adised  To  Take  The  Lactulose  As  Directed    Stop  The  Stool  softners  As  Directed . Drink  Lots  Of  Fluids  And   followup  If  Any  Problems.   Wife  Verbalized knowledge of plan of  Care

## 2017-12-24 IMAGING — DX DG LUMBAR SPINE 2-3V
2 series · 2 of 2 positions shown · non-contrast
Comparison: 05/13/2015 CT abdomen and pelvis.

CLINICAL DATA: 87-year-old male with lower back pain mainly on the
right for the past 3 days. No known injury. Initial encounter.

EXAM:
LUMBAR SPINE - 2-3 VIEW

[l-spine ap]
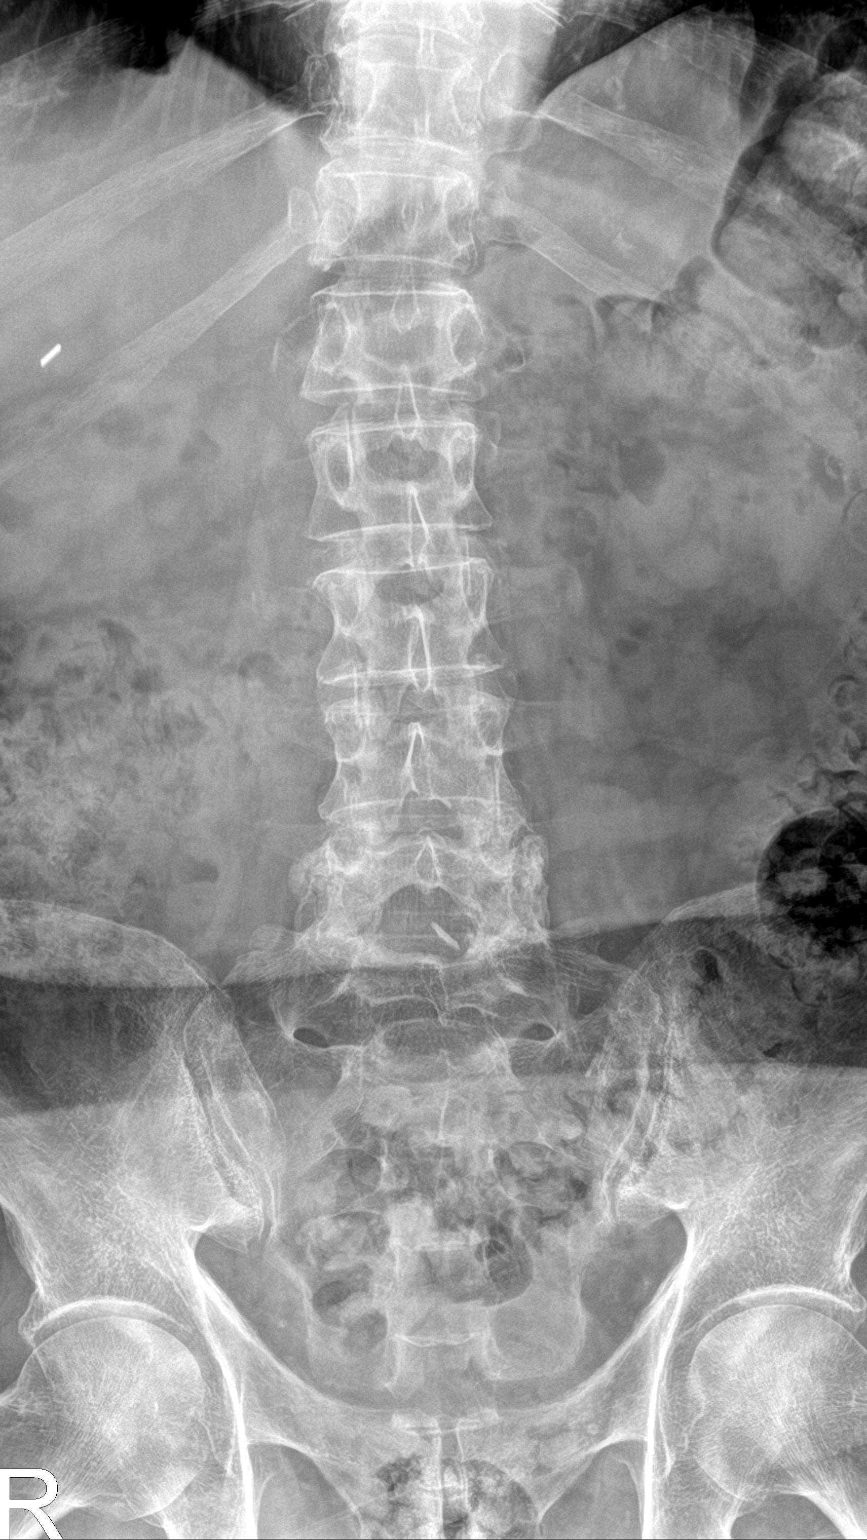

[l-spine lat]
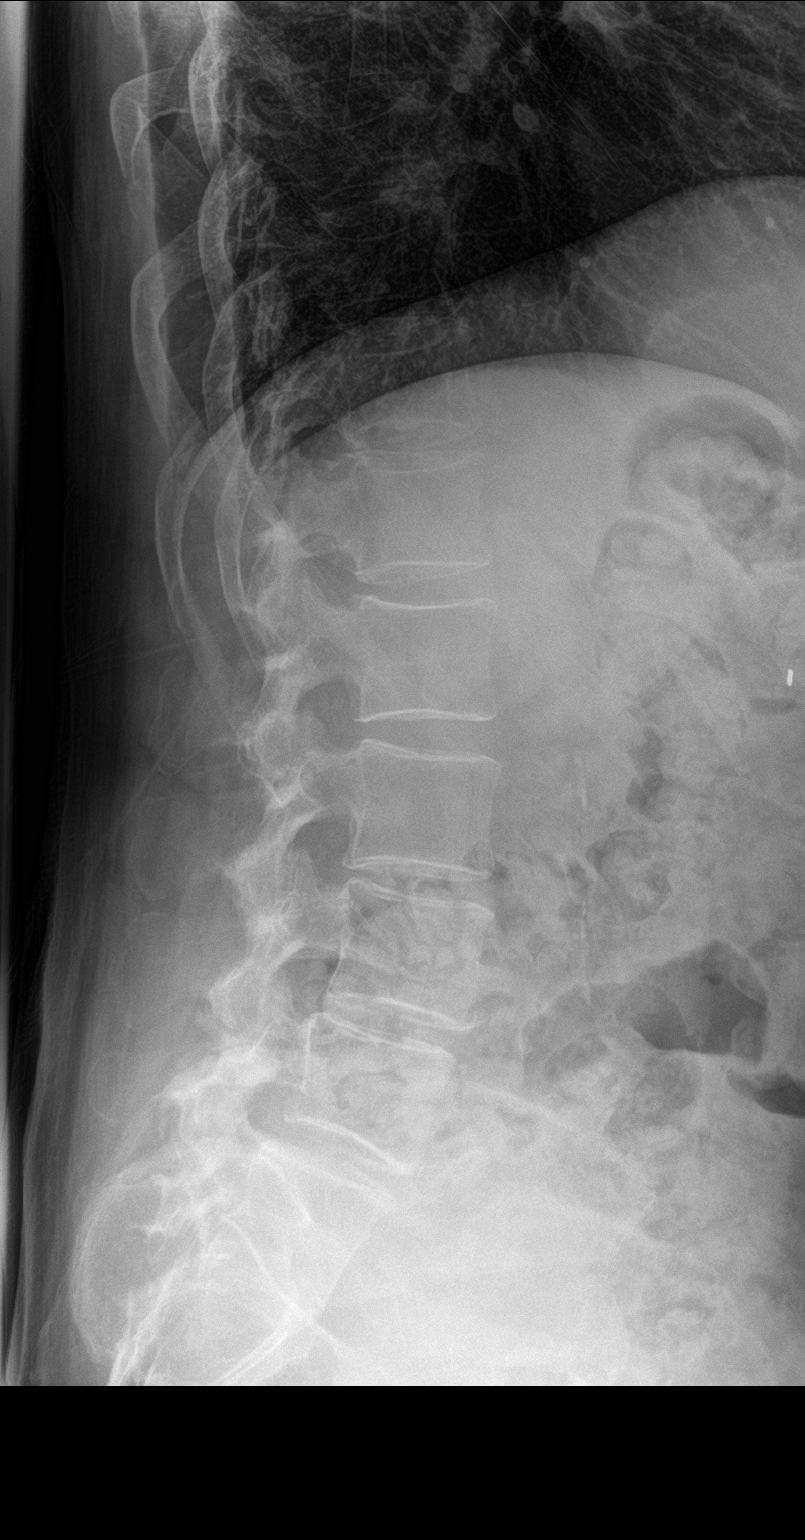

[2 of 2 positions shown; findings below may reference images not displayed]

FINDINGS: Transitional S1 vertebral body.

Minimal curvature lumbar spine convex to the right.

Minimal anterior slip L5 with minimal L5-S1 disc space narrowing.

Mild L3-4 and L4-5 disc space narrowing.

Minimal hip joint degenerative changes.

Vascular calcifications.
IMPRESSION: Minimal anterior slip L5 with minimal L5-S1 disc space narrowing.

Mild L3-4 and L4-5 disc space narrowing.

## 2017-12-28 ENCOUNTER — Other Ambulatory Visit: Payer: Self-pay | Admitting: Adult Health

## 2017-12-28 NOTE — Telephone Encounter (Signed)
Pt has not scheduled appt for cpx/yearly visit.  Please advise.

## 2017-12-28 NOTE — Telephone Encounter (Signed)
Ok to refill for 365 days  

## 2017-12-28 NOTE — Telephone Encounter (Signed)
Sent to the pharmacy by e-scribe. 

## 2018-01-03 DIAGNOSIS — H18453 Nodular corneal degeneration, bilateral: Secondary | ICD-10-CM | POA: Diagnosis not present

## 2018-01-03 DIAGNOSIS — H04123 Dry eye syndrome of bilateral lacrimal glands: Secondary | ICD-10-CM | POA: Diagnosis not present

## 2018-01-05 ENCOUNTER — Ambulatory Visit (INDEPENDENT_AMBULATORY_CARE_PROVIDER_SITE_OTHER): Payer: MEDICARE | Admitting: Adult Health

## 2018-01-05 ENCOUNTER — Encounter: Payer: Self-pay | Admitting: Adult Health

## 2018-01-05 VITALS — BP 134/66 | Temp 98.1°F | Wt 139.0 lb

## 2018-01-05 DIAGNOSIS — R4189 Other symptoms and signs involving cognitive functions and awareness: Secondary | ICD-10-CM | POA: Diagnosis not present

## 2018-01-05 LAB — POC URINALSYSI DIPSTICK (AUTOMATED)
BILIRUBIN UA: NEGATIVE
Blood, UA: NEGATIVE
GLUCOSE UA: NEGATIVE
KETONES UA: NEGATIVE
Leukocytes, UA: NEGATIVE
Nitrite, UA: NEGATIVE
Protein, UA: NEGATIVE
Urobilinogen, UA: 0.2 E.U./dL
pH, UA: 6 (ref 5.0–8.0)

## 2018-01-05 NOTE — Patient Instructions (Signed)
Home health will call you to make an appointment to come and evaluate you   Please start taking Aricept   Follow up as needed

## 2018-01-05 NOTE — Progress Notes (Signed)
Subjective:    Patient ID: Jack Noble, male    DOB: September 09, 1929, 84 y.o.   MRN: [id number]  HPI 84 year old male who  has a past medical history of Allergy, Aphasia (12/26/2013), Cataracts, bilateral, Corneal abrasion, Dermatophytosis of nail, Diverticulosis, Epistaxis, Glaucoma, Glaucoma, Hearing loss, History of GI diverticular bleed (710/15), Hyperlipidemia, Hyperplasia of prostate without urinary obstruction, Hypertension, Rosacea, Sepsis due to Escherichia coli (E. coli) ([place]) (05/2011), and TIA (transient ischemic attack) (9/6-05/2012).  He presents to the office today with his wife who helps provide history. His wife reports that " Jack Noble has not been himself during the last week". When asked to elaborate she has a hard time pin pointing her complaint. She reports that he does not cooperate like he used to. His wife reports that he has not had a fever or complained of feeling acutely ill.   His wife has not started his Aricept yet, as she does not like the side effect profile.  Jack Noble does not complain of anything.   His wife is tearful during this exam and feels as though she finally needs help with Jack Noble at home.   Review of Systems See HPI   Past Medical History:  Diagnosis Date  . Allergy   . Aphasia 12/26/2013   Dr Jannifer Franklin  . Cataracts, bilateral   . Corneal abrasion    Dr. Ned Clines, [facility] Ophth  . Dermatophytosis of nail   . Diverticulosis   . Epistaxis   . Glaucoma    Dr. Edilia Bo  . Glaucoma   . Hearing loss   . History of GI diverticular bleed 710/15  . Hyperlipidemia   . Hyperplasia of prostate without urinary obstruction   . Hypertension   . Rosacea    Dr. Marge Duncans  . Sepsis due to Escherichia coli (E. coli) ([place]) 05/2011   Morganella also cultured  . TIA (transient ischemic attack) 9/6-05/2012   BP 221/99    Social History   Socioeconomic History  . Marital status: Married    Spouse name: Not on file  . Number of children: 1  . Years of education: college    . Highest education level: Not on file  Social Needs  . Financial resource strain: Not on file  . Food insecurity - worry: Not on file  . Food insecurity - inability: Not on file  . Transportation needs - medical: Not on file  . Transportation needs - non-medical: Not on file  Occupational History  . Occupation: [employer]    Employer: RETIRED  Tobacco Use  . Smoking status: Never Smoker  . Smokeless tobacco: Never Used  Substance and Sexual Activity  . Alcohol use: No  . Drug use: No  . Sexual activity: Not on file  Other Topics Concern  . Not on file  Social History Narrative   Lives with his wife.     Was in the [employer] for 15 years   Left handed    Past Surgical History:  Procedure Laterality Date  . APPENDECTOMY     age 40  . CATARACT EXTRACTION, BILATERAL  2003   Dr Ishmael Holter (now seen @ [place])  . CHOLECYSTECTOMY  1978   stones  . COLONOSCOPY W/ POLYPECTOMY  06/2009   Dr.Jacobs; "miniscule polyp"  . HERNIA REPAIR    . INGUINAL HERNIA REPAIR     age 53  . SKIN BIOPSY  12/2014   done by dermatologist   . TONSILLECTOMY AND ADENOIDECTOMY    .  UPPER GI ENDOSCOPY  05/2014   negative    Family History  Problem Relation Age of Onset  . Stroke Mother 52  . Parkinsonism Brother        Brother died at age at age 57  . Pulmonary embolism Father 49       Post Op  . Diabetes Neg Hx   . Cancer Neg Hx   . COPD Neg Hx   . Heart disease Neg Hx     Allergies  Allergen Reactions  . Amoxicillin     urticaria  . Codeine     Rash Because of a history of documented adverse serious drug reaction;Medi Alert bracelet  is recommended  . Minocycline Hcl     REACTION: nervous/chest pressure (Note: may have affected GE valve)  . Astelin [Azelastine Hcl]     Makes me jittery  . Azelastine Other (See Comments)  . Ether     pneumonia  . Zocor [Simvastatin] Other (See Comments)    Itching w/o, muscle problems and it affected his stomach  . Metoprolol      lightheaded    Current Outpatient Medications on File Prior to Visit  Medication Sig Dispense Refill  . aspirin EC 81 MG tablet Take 81 mg by mouth daily.     . Cholecalciferol (VITAMIN D) 1000 UNITS capsule Take 1,000 Units by mouth every morning.     Water engineer Bandages & Supports (ABDOMINAL BINDER/ELASTIC LARGE) MISC Large or appropriate size Large abdominal hernia 1 each 0  . finasteride (PROSCAR) 5 MG tablet Take 1 tablet (5 mg total) by mouth daily. 90 tablet 3  . lactulose (CHRONULAC) 10 GM/15ML solution Take 15 mLs (10 g total) by mouth daily. 473 mL 3  . losartan (COZAAR) 50 MG tablet TAKE 1 TABLET BY MOUTH EVERY DAY 90 tablet 3  . Multiple Vitamin (MULTIVITAMIN WITH MINERALS) TABS Take 1 tablet by mouth every morning.     . nystatin-triamcinolone (MYCOLOG II) cream Apply 1 application topically 2 (two) times daily as needed (Applies to red places in groin area.). 30 g 0  . simethicone (MYLICON) 80 MG chewable tablet Chew 1 tablet (80 mg total) by mouth every 6 (six) hours as needed for flatulence. 30 tablet 0  . Timolol Maleate (ISTALOL) 0.5 % (DAILY) SOLN Place 1 drop into both eyes 2 (two) times daily at 8 am and 10 pm.     . donepezil (ARICEPT) 5 MG tablet Take 1 tablet (5 mg total) by mouth at bedtime. (Patient not taking: Reported on 01/05/2018) 30 tablet 1   No current facility-administered medications on file prior to visit.     BP 134/66 (BP Location: Left Arm)   Temp 98.1 F (36.7 C) (Oral)   Wt 139 lb (63 kg)   BMI 23.13 kg/m       Objective:   Physical Exam  Constitutional: He is oriented to person, place, and time. He appears well-developed and well-nourished. No distress.  Cardiovascular: Normal rate, regular rhythm, normal heart sounds and intact distal pulses. Exam reveals no gallop and no friction rub.  No murmur heard. Pulmonary/Chest: Effort normal and breath sounds normal. No respiratory distress. He has no wheezes. He has no rales. He exhibits no  tenderness.  Neurological: He is alert and oriented to person, place, and time.  Skin: Skin is warm and dry. No rash noted. He is not diaphoretic. No erythema. No pallor.  Psychiatric: He has a normal mood and affect. His behavior is normal.  Judgment and thought content normal.  Nursing note and vitals reviewed.     Assessment & Plan:  1. Cognitive decline  - POCT Urinalysis Dipstick (Automated)- Negative  - Ambulatory referral to [place]

## 2018-01-08 DIAGNOSIS — G3184 Mild cognitive impairment, so stated: Secondary | ICD-10-CM | POA: Diagnosis not present

## 2018-01-08 DIAGNOSIS — I10 Essential (primary) hypertension: Secondary | ICD-10-CM | POA: Diagnosis not present

## 2018-01-08 DIAGNOSIS — I69320 Aphasia following cerebral infarction: Secondary | ICD-10-CM | POA: Diagnosis not present

## 2018-01-09 ENCOUNTER — Other Ambulatory Visit: Payer: Self-pay | Admitting: Family Medicine

## 2018-01-10 ENCOUNTER — Ambulatory Visit: Payer: Self-pay | Admitting: *Deleted

## 2018-01-10 NOTE — Telephone Encounter (Signed)
Pt  Caregiver   Verbalized   She   Started  aricept  sev  Days  Ago  And  Had  Episode  Over the  Weekend    Of  bried  Confusion  As  Well  As  Visual  Difficulty   12  Hours   Ago   Pt  Wife  Reports  Now  He  Is  At  Baseline. He  also had  Some  Diarrhea  As  Well  X  1  Day  After  Starting the med  It has  Subsided  At this  Time .   Reason for Disposition . [1] Longstanding confusion (e.g., dementia, stroke) AND [2] NO worsening or change  Answer Assessment - Initial Assessment Questions 1. LEVEL OF CONSCIOUSNESS: "How is he (she, the patient) acting right now?" (e.g., alert-oriented, confused, lethargic, stuporous, comatose)    Baseline    At this  Time     Appropriate  Behavior      2. ONSET: "When did the confusion start?"  (minutes, hours, days)      Had  Episode  Of  Confusion  And   Visual difficulties  Which  Lasted  About  10  mins   Symptoms  12  Hours   3. PATTERN "Does this come and go, or has it been constant since it started?"  "Is it present now?"     No 4. ALCOHOL or DRUGS: "Has he been drinking alcohol or taking any drugs?"      No 5. NARCOTIC MEDICATIONS: "Has he been receiving any narcotic medications?" (e.g., morphine, Vicodin)      No 6. CAUSE: "What do you think is causing the confusion?"        Possibly  A  Bad  Dream    7. OTHER SYMPTOMS: "Are there any other symptoms?" (e.g., difficulty breathing, headache, fever, weakness)  Diarrhea    sev  Days  Protocols used: CONFUSION - DELIRIUM-A-AH

## 2018-01-11 ENCOUNTER — Ambulatory Visit: Payer: MEDICARE | Admitting: Adult Health

## 2018-01-12 ENCOUNTER — Encounter: Payer: Self-pay | Admitting: Adult Health

## 2018-01-12 ENCOUNTER — Ambulatory Visit (INDEPENDENT_AMBULATORY_CARE_PROVIDER_SITE_OTHER): Payer: MEDICARE | Admitting: Adult Health

## 2018-01-12 VITALS — BP 140/64 | Temp 97.6°F | Wt 140.0 lb

## 2018-01-12 DIAGNOSIS — T50905A Adverse effect of unspecified drugs, medicaments and biological substances, initial encounter: Secondary | ICD-10-CM

## 2018-01-12 NOTE — Progress Notes (Signed)
Subjective:    Patient ID: Jack Noble, male    DOB: 30-Sep-1929, 84 y.o.   MRN: [id number]  HPI  84 year old male who  has a past medical history of Allergy, Aphasia (12/26/2013), Cataracts, bilateral, Corneal abrasion, Dermatophytosis of nail, Diverticulosis, Epistaxis, Glaucoma, Glaucoma, Hearing loss, History of GI diverticular bleed (710/15), Hyperlipidemia, Hyperplasia of prostate without urinary obstruction, Hypertension, Rosacea, Sepsis due to Escherichia coli (E. coli) ([place]) (05/2011), and TIA (transient ischemic attack) (9/6-05/2012).  She presents to the office today for follow up starting Aricept.  His wife reports that they started Aricept 4 night ago, second night [name] had woken up early in the morning and did not know where he was told that he cannot see.  His wife also reports 20 less than 24 hours of diarrhea which has since resolved.  After that incident in the early morning his wife stopped the medication and followed up here.  Since stopping the medication his symptoms have resolved  Review of Systems See HPI   Past Medical History:  Diagnosis Date  . Allergy   . Aphasia 12/26/2013   Dr Jack Noble  . Cataracts, bilateral   . Corneal abrasion    Dr. Ned Noble, [facility] Ophth  . Dermatophytosis of nail   . Diverticulosis   . Epistaxis   . Glaucoma    Dr. Edilia Noble  . Glaucoma   . Hearing loss   . History of GI diverticular bleed 710/15  . Hyperlipidemia   . Hyperplasia of prostate without urinary obstruction   . Hypertension   . Rosacea    Dr. Marge Noble  . Sepsis due to Escherichia coli (E. coli) ([place]) 05/2011   Morganella also cultured  . TIA (transient ischemic attack) 9/6-05/2012   BP 221/99    Social History   Socioeconomic History  . Marital status: Married    Spouse name: Not on file  . Number of children: 1  . Years of education: college  . Highest education level: Not on file  Social Needs  . Financial resource strain: Not on file  . Food insecurity -  worry: Not on file  . Food insecurity - inability: Not on file  . Transportation needs - medical: Not on file  . Transportation needs - non-medical: Not on file  Occupational History  . Occupation: [employer]    Employer: RETIRED  Tobacco Use  . Smoking status: Never Smoker  . Smokeless tobacco: Never Used  Substance and Sexual Activity  . Alcohol use: No  . Drug use: No  . Sexual activity: Not on file  Other Topics Concern  . Not on file  Social History Narrative   Lives with his wife.     Was in the [employer] for 15 years   Left handed    Past Surgical History:  Procedure Laterality Date  . APPENDECTOMY     age 64  . CATARACT EXTRACTION, BILATERAL  2003   Dr Ishmael Noble (now seen @ [place])  . CHOLECYSTECTOMY  1978   stones  . COLONOSCOPY W/ POLYPECTOMY  06/2009   Dr.Jacobs; "miniscule polyp"  . HERNIA REPAIR    . INGUINAL HERNIA REPAIR     age 75  . SKIN BIOPSY  12/2014   done by dermatologist   . TONSILLECTOMY AND ADENOIDECTOMY    . UPPER GI ENDOSCOPY  05/2014   negative    Family History  Problem Relation Age of Onset  . Stroke Mother 83  . Parkinsonism Brother  Brother died at age at age 53  . Pulmonary embolism Father 13       Post Op  . Diabetes Neg Hx   . Cancer Neg Hx   . COPD Neg Hx   . Heart disease Neg Hx     Allergies  Allergen Reactions  . Amoxicillin     urticaria  . Codeine     Rash Because of a history of documented adverse serious drug reaction;Medi Alert bracelet  is recommended  . Minocycline Hcl     REACTION: nervous/chest pressure (Note: may have affected GE valve)  . Astelin [Azelastine Hcl]     Makes me jittery  . Azelastine Other (See Comments)  . Ether     pneumonia  . Zocor [Simvastatin] Other (See Comments)    Itching w/o, muscle problems and it affected his stomach  . Metoprolol     lightheaded    Current Outpatient Medications on File Prior to Visit  Medication Sig Dispense Refill  . aspirin EC 81  MG tablet Take 81 mg by mouth daily.     . Cholecalciferol (VITAMIN D) 1000 UNITS capsule Take 1,000 Units by mouth every morning.     Water engineer Bandages & Supports (ABDOMINAL BINDER/ELASTIC LARGE) MISC Large or appropriate size Large abdominal hernia 1 each 0  . finasteride (PROSCAR) 5 MG tablet Take 1 tablet (5 mg total) by mouth daily. 90 tablet 3  . losartan (COZAAR) 50 MG tablet TAKE 1 TABLET BY MOUTH EVERY DAY 90 tablet 3  . Multiple Vitamin (MULTIVITAMIN WITH MINERALS) TABS Take 1 tablet by mouth every morning.     . nystatin cream (MYCOSTATIN) APPLY TO AFFECTED AREA TWICE A DAY 30 g 0  . simethicone (MYLICON) 80 MG chewable tablet Chew 1 tablet (80 mg total) by mouth every 6 (six) hours as needed for flatulence. 30 tablet 0  . timolol (TIMOPTIC) 0.5 % ophthalmic solution     . triamcinolone cream (KENALOG) 0.1 % APPLY TO AFFECTED AREA TWICE A DAY 30 g 0  . donepezil (ARICEPT) 5 MG tablet Take 1 tablet (5 mg total) by mouth at bedtime. (Patient not taking: Reported on 01/05/2018) 30 tablet 1   No current facility-administered medications on file prior to visit.     BP 140/64 (BP Location: Left Arm)   Temp 97.6 F (36.4 C) (Oral)   Wt 140 lb (63.5 kg)   BMI 23.30 kg/m       Objective:   Physical Exam  Constitutional: He is oriented to person, place, and time.  Cardiovascular: Normal rate, regular rhythm, normal heart sounds and intact distal pulses. Exam reveals no gallop and no friction rub.  No murmur heard. Pulmonary/Chest: Effort normal and breath sounds normal. No respiratory distress. He has no wheezes. He has no rales. He exhibits no tenderness.  Neurological: He is alert and oriented to person, place, and time.  Skin: Skin is warm and dry. No rash noted. No erythema. No pallor.  Psychiatric: He has a normal mood and affect. Judgment normal.  Nursing note and vitals reviewed.     Assessment & Plan:  1. Adverse effect of drug, initial encounter -Questionable  medication reaction.  Less than 1% of patients will have some type of delusions.  Diarrhea is a side effect.  We will have the retry Aricept and follow-up next week if any adverse reactions happen\  [name], NP

## 2018-01-13 ENCOUNTER — Telehealth: Payer: Self-pay | Admitting: Family Medicine

## 2018-01-13 DIAGNOSIS — I10 Essential (primary) hypertension: Secondary | ICD-10-CM | POA: Diagnosis not present

## 2018-01-13 DIAGNOSIS — G3184 Mild cognitive impairment, so stated: Secondary | ICD-10-CM | POA: Diagnosis not present

## 2018-01-13 DIAGNOSIS — I69320 Aphasia following cerebral infarction: Secondary | ICD-10-CM | POA: Diagnosis not present

## 2018-01-13 NOTE — Telephone Encounter (Signed)
Copied from [place]. Topic: Inquiry >> Jan 13, 2018  2:43 PM Oliver Pila B wrote: Reason for CRM: [facility] needing verbals for speech therapy for once a week for 4 weeks contact Tammy @ [phone]

## 2018-01-13 NOTE — Telephone Encounter (Signed)
Per Tommi Rumps, verbal orders authorized.

## 2018-01-14 NOTE — Telephone Encounter (Signed)
Spoke to [place] and informed her to proceed with orders.  No further action required.  Will close note.

## 2018-01-15 DIAGNOSIS — G3184 Mild cognitive impairment, so stated: Secondary | ICD-10-CM | POA: Diagnosis not present

## 2018-01-15 DIAGNOSIS — I69320 Aphasia following cerebral infarction: Secondary | ICD-10-CM | POA: Diagnosis not present

## 2018-01-15 DIAGNOSIS — I10 Essential (primary) hypertension: Secondary | ICD-10-CM | POA: Diagnosis not present

## 2018-01-18 DIAGNOSIS — G3184 Mild cognitive impairment, so stated: Secondary | ICD-10-CM | POA: Diagnosis not present

## 2018-01-18 DIAGNOSIS — I10 Essential (primary) hypertension: Secondary | ICD-10-CM | POA: Diagnosis not present

## 2018-01-18 DIAGNOSIS — I69320 Aphasia following cerebral infarction: Secondary | ICD-10-CM | POA: Diagnosis not present

## 2018-01-26 DIAGNOSIS — I10 Essential (primary) hypertension: Secondary | ICD-10-CM | POA: Diagnosis not present

## 2018-01-26 DIAGNOSIS — G3184 Mild cognitive impairment, so stated: Secondary | ICD-10-CM | POA: Diagnosis not present

## 2018-01-26 DIAGNOSIS — I69320 Aphasia following cerebral infarction: Secondary | ICD-10-CM | POA: Diagnosis not present

## 2018-02-02 DIAGNOSIS — I10 Essential (primary) hypertension: Secondary | ICD-10-CM | POA: Diagnosis not present

## 2018-02-02 DIAGNOSIS — G3184 Mild cognitive impairment, so stated: Secondary | ICD-10-CM | POA: Diagnosis not present

## 2018-02-02 DIAGNOSIS — I69320 Aphasia following cerebral infarction: Secondary | ICD-10-CM | POA: Diagnosis not present

## 2018-02-04 DIAGNOSIS — G3184 Mild cognitive impairment, so stated: Secondary | ICD-10-CM | POA: Diagnosis not present

## 2018-02-04 DIAGNOSIS — I69320 Aphasia following cerebral infarction: Secondary | ICD-10-CM | POA: Diagnosis not present

## 2018-02-04 DIAGNOSIS — I10 Essential (primary) hypertension: Secondary | ICD-10-CM | POA: Diagnosis not present

## 2018-02-12 DIAGNOSIS — I10 Essential (primary) hypertension: Secondary | ICD-10-CM | POA: Diagnosis not present

## 2018-02-12 DIAGNOSIS — G3184 Mild cognitive impairment, so stated: Secondary | ICD-10-CM | POA: Diagnosis not present

## 2018-02-12 DIAGNOSIS — I69320 Aphasia following cerebral infarction: Secondary | ICD-10-CM | POA: Diagnosis not present

## 2018-02-17 ENCOUNTER — Telehealth: Payer: Self-pay | Admitting: Neurology

## 2018-02-17 NOTE — Telephone Encounter (Signed)
Called wife back. Advised per Dr. Jannifer Franklin that she should hold off on giving Aricept since he already stopped medication. Dr. Jannifer Franklin is ok with this and will speak to them when he comes for follow up on 03/03/18. She verbalized understanding and appreciation for call.

## 2018-02-17 NOTE — Telephone Encounter (Signed)
Called wife back. LVM returning call. Gave [facility] phone number for return call.

## 2018-02-17 NOTE — Telephone Encounter (Signed)
Pt wife(on DPR) is wanting to discuss her inability to give pt his donepezil (ARICEPT) 5 MG tablet everyday due to not being 100% herself.  Please call

## 2018-02-17 NOTE — Telephone Encounter (Signed)
Called wife back. She states she has not been giving Aricept on a consistent basis and was very apologetic. She has a lot of own health problems (recently hurt her knee) that she has been dealing with. She cannot remember to give Aricept at night. She wanted to let Dr. Jannifer Franklin know. She verbalized she should have contacted Dr. Jannifer Franklin sooner. I asked if they have friends/family to help with medication administration. She did not give exact answer. States they have some church friends that are Microbiologist.   They did see PCP back on 01/05/18 and they placed home health referral. She states home health came out to check on husband but she still cannot remember to give medication. He stopped Aricept 3-4 weeks ago per wife. She was wondering if he should keep upcoming appt on 03/03/18. I advised they should keep appt. Dr. Jannifer Franklin can address this at appt. She wanted to know if she should restart husband on Aricept and have him take it until appt. I recommended she hold off until I spoke with Dr. Jannifer Franklin.   Per Dr. Jannifer Franklin- Pt can stay off Aricept until he is seen in the office. He will address this at office visit.

## 2018-03-01 DIAGNOSIS — L821 Other seborrheic keratosis: Secondary | ICD-10-CM | POA: Diagnosis not present

## 2018-03-01 DIAGNOSIS — L719 Rosacea, unspecified: Secondary | ICD-10-CM | POA: Diagnosis not present

## 2018-03-01 DIAGNOSIS — D229 Melanocytic nevi, unspecified: Secondary | ICD-10-CM | POA: Diagnosis not present

## 2018-03-03 ENCOUNTER — Encounter: Payer: Self-pay | Admitting: Neurology

## 2018-03-03 ENCOUNTER — Ambulatory Visit (INDEPENDENT_AMBULATORY_CARE_PROVIDER_SITE_OTHER): Payer: MEDICARE | Admitting: Neurology

## 2018-03-03 VITALS — BP 133/71 | HR 59 | Ht 65.0 in | Wt 139.0 lb

## 2018-03-03 DIAGNOSIS — R4701 Aphasia: Secondary | ICD-10-CM | POA: Diagnosis not present

## 2018-03-03 NOTE — Patient Instructions (Signed)
   Re-try the aricept (donepezil) 5 mg in the morning.  Begin Aricept (donepezil) at 5 mg at night for one month. If this medication is well-tolerated, please call our office and we will call in a prescription for the 10 mg tablets. Look out for side effects that may include nausea, diarrhea, weight loss, or stomach cramps. This medication will also cause a runny nose, therefore there is no need for allergy medications for this purpose.

## 2018-03-03 NOTE — Progress Notes (Signed)
Reason for visit: Memory problems, aphasia  Jack Noble is an 84 y.o. 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  The patient has had some increasing problems with memory according to the wife.  The patient is not able to effectively communicate secondary to the aphasia, he is still able to operate a motor vehicle and does well with this.  The patient was placed on Aricept but apparently had some vivid dreams on the medication and he did not wish to take the drug at night.  The patient has not had any falls, he is getting around fairly well.  He does some housework, but he is unable to communicate effectively.  He returns to this office for an evaluation.  Past Medical History:  Diagnosis Date  . Allergy   . Aphasia 12/26/2013   Dr Jannifer Franklin  . Cataracts, bilateral   . Corneal abrasion    Dr. Ned Clines, [facility] Ophth  . Dermatophytosis of nail   . Diverticulosis   . Epistaxis   . Glaucoma    Dr. Edilia Bo  . Glaucoma   . Hearing loss   . History of GI diverticular bleed 710/15  . Hyperlipidemia   . Hyperplasia of prostate without urinary obstruction   . Hypertension   . Rosacea    Dr. Marge Duncans  . Sepsis due to Escherichia coli (E. coli) ([place]) 05/2011   Morganella also cultured  . TIA (transient ischemic attack) 9/6-05/2012   BP 221/99    Past Surgical History:  Procedure Laterality Date  . APPENDECTOMY     age 68  . CATARACT EXTRACTION, BILATERAL  2003   Dr Ishmael Holter (now seen @ [place])  . CHOLECYSTECTOMY  1978   stones  . COLONOSCOPY W/ POLYPECTOMY  06/2009   Dr.Jacobs; "miniscule polyp"  . HERNIA REPAIR    . INGUINAL HERNIA REPAIR     age 49  . SKIN BIOPSY  12/2014   done by dermatologist   . TONSILLECTOMY AND ADENOIDECTOMY    . UPPER GI ENDOSCOPY  05/2014   negative    Family History  Problem Relation Age of Onset  . Stroke Mother 65  .  Parkinsonism Brother        Brother died at age at age 40  . Pulmonary embolism Father 62       Post Op  . Diabetes Neg Hx   . Cancer Neg Hx   . COPD Neg Hx   . Heart disease Neg Hx     Social history:  reports that he has never smoked. He has never used smokeless tobacco. He reports that he does not drink alcohol or use drugs.    Allergies  Allergen Reactions  . Amoxicillin     urticaria  . Codeine     Rash Because of a history of documented adverse serious drug reaction;Medi Alert bracelet  is recommended  . Minocycline Hcl     REACTION: nervous/chest pressure (Note: may have affected GE valve)  . Astelin [Azelastine Hcl]     Makes me jittery  . Azelastine Other (See Comments)  . Ether     pneumonia  . Zocor [Simvastatin] Other (See Comments)    Itching w/o, muscle problems and it affected his stomach  . Metoprolol     lightheaded    Medications:  Prior to Admission medications   Medication Sig Start Date End Date  Taking? Authorizing Provider  aspirin EC 81 MG tablet Take 81 mg by mouth daily.    Yes [provider]  Cholecalciferol (VITAMIN D) 1000 UNITS capsule Take 1,000 Units by mouth every morning.    Yes [provider]  donepezil (ARICEPT) 5 MG tablet Take 1 tablet (5 mg total) by mouth at bedtime. 09/10/17  Yes Kathrynn Ducking, MD  Elastic Bandages & Supports (ABDOMINAL BINDER/ELASTIC LARGE) MISC Large or appropriate size Large abdominal hernia 08/10/16  Yes Burns, Claudina Lick, MD  finasteride (PROSCAR) 5 MG tablet Take 1 tablet (5 mg total) by mouth daily. 03/04/17  Yes Nafziger, Tommi Rumps, NP  losartan (COZAAR) 50 MG tablet TAKE 1 TABLET BY MOUTH EVERY DAY 12/28/17  Yes Nafziger, Tommi Rumps, NP  metroNIDAZOLE (METROCREAM) 0.75 % cream Apply 1 application topically as needed. Apply to face prn 03/01/18  Yes [provider]  Multiple Vitamin (MULTIVITAMIN WITH MINERALS) TABS Take 1 tablet by mouth every morning.    Yes [provider]  nystatin  cream (MYCOSTATIN) APPLY TO AFFECTED AREA TWICE A DAY 01/10/18  Yes Nafziger, Tommi Rumps, NP  simethicone (MYLICON) 80 MG chewable tablet Chew 1 tablet (80 mg total) by mouth every 6 (six) hours as needed for flatulence. 06/03/14  Yes Kinnie Feil, MD  timolol (TIMOPTIC) 0.5 % ophthalmic solution  01/10/18  Yes [provider]  triamcinolone cream (KENALOG) 0.1 % APPLY TO AFFECTED AREA TWICE A DAY 01/10/18  Yes Nafziger, Tommi Rumps, NP    ROS:  Out of a complete 14 system review of symptoms, the patient complains only of the following symptoms, and all other reviewed systems are negative.  Constipation Frequency of urination Memory loss, speech difficulty Agitation, confusion  Blood pressure 133/71, pulse (!) 59, height 5\' 5"  (1.651 m), weight 139 lb (63 kg).  Physical Exam  General: The patient is alert and cooperative at the time of the examination.  Skin: No significant peripheral edema is noted.   Neurologic Exam  Mental status: The patient is alert and cooperative the time of examination, the patient is very aphasic, has difficulty with understanding and generating speech.  He is having difficulty following verbal commands.   Cranial nerves: Facial symmetry is present. Speech is severely aphasic. Extraocular movements are full. Visual fields are full.  Motor: The patient has good strength in all 4 extremities.  Sensory examination: Soft touch sensation is symmetric on the face, arms, and legs.  Coordination: The patient has good finger-nose-finger bilaterally.  There appears to be some degree of apraxia with use of the lower extremities.  Gait and station: The patient has a slightly wide-based gait, the tandem gait was not tested.  Romberg is negative.  Reflexes: Deep tendon reflexes are symmetric.   Assessment/Plan:  1.  Aphasia following encephalitis  2.  Memory disorder  The patient will retry the Aricept in the morning, if it causes side effects they are to  contact me, we will consider the use of Namenda.  The patient will follow-up in 6 months.  Jill Alexanders MD 03/03/2018 2:56 PM  [facility] [address] [place], [place] [address]  Phone [phone] Fax [phone]

## 2018-03-14 ENCOUNTER — Telehealth: Payer: Self-pay | Admitting: Neurology

## 2018-03-14 DIAGNOSIS — R404 Transient alteration of awareness: Secondary | ICD-10-CM

## 2018-03-14 NOTE — Telephone Encounter (Signed)
The EMS called me regarding the patient.  He appeared to have some transient confusion today, did not want to come downstairs the breakfast, now at his baseline.  He continues to have his usual aphasia.  I will check an EEG study, get a revisit for him.

## 2018-03-14 NOTE — Telephone Encounter (Signed)
Called pt wife, Joycelyn Schmid. I offered appt tomorrow at 1030am but she declined stating this was too early of an appt for pt. Scheduled appt for 03/25/18 at 12pm, check in no later than 1145am. She verbalized understanding and appreciation for call.

## 2018-03-15 ENCOUNTER — Encounter: Payer: Self-pay | Admitting: Adult Health

## 2018-03-15 ENCOUNTER — Ambulatory Visit (INDEPENDENT_AMBULATORY_CARE_PROVIDER_SITE_OTHER): Payer: MEDICARE | Admitting: Adult Health

## 2018-03-15 VITALS — BP 118/72 | HR 68 | Temp 98.5°F | Resp 16 | Wt 143.2 lb

## 2018-03-15 DIAGNOSIS — M545 Low back pain, unspecified: Secondary | ICD-10-CM

## 2018-03-15 DIAGNOSIS — H938X1 Other specified disorders of right ear: Secondary | ICD-10-CM | POA: Diagnosis not present

## 2018-03-15 NOTE — Progress Notes (Signed)
Subjective:    Patient ID: Jack Noble, male    DOB: 1929-02-03, 84 y.o.   MRN: [id number]  HPI 84 year old male who  has a past medical history of Allergy, Aphasia (12/26/2013), Cataracts, bilateral, Corneal abrasion, Dermatophytosis of nail, Diverticulosis, Epistaxis, Glaucoma, Glaucoma, Hearing loss, History of GI diverticular bleed (710/15), Hyperlipidemia, Hyperplasia of prostate without urinary obstruction, Hypertension, Rosacea, Sepsis due to Escherichia coli (E. coli) ([place]) (05/2011), and TIA (transient ischemic attack) (9/6-05/2012).  Presents with his wife today for an acute care visit.  His wife reports that she believes his right ear is clogged with wax Jack Noble has been complaining of sensation of ear fullness and he has had some right sided lower back pain recently.  Wife denies any trauma   Review of Systems See HPI   Past Medical History:  Diagnosis Date  . Allergy   . Aphasia 12/26/2013   Dr Jannifer Franklin  . Cataracts, bilateral   . Corneal abrasion    Dr. Ned Clines, [facility] Op[facility]  . Dermatophytosis of nail   . Diverticulosis   . Epistaxis   . Glaucoma    Dr. Edilia Bo  . Glaucoma   . Hearing loss   . History of GI diverticular bleed 710/15  . Hyperlipidemia   . Hyperplasia of prostate without urinary obstruction   . Hypertension   . Rosacea    Dr. Marge Duncans  . Sepsis due to Escherichia coli (E. coli) ([place]) 05/2011   Morganella also cultured  . TIA (transient ischemic attack) 9/6-05/2012   BP 221/99    Social History   Socioeconomic History  . Marital status: Married    Spouse name: Not on file  . Number of children: 1  . Years of education: college  . Highest education level: Not on file  Occupational History  . Occupation: [employer]    Employer: RETIRED  Social Needs  . Financial resource strain: Not on file  . Food insecurity:    Worry: Not on file    Inability: Not on file  . Transportation needs:    Medical: Not on file    Non-medical: Not on file    Tobacco Use  . Smoking status: Never Smoker  . Smokeless tobacco: Never Used  Substance and Sexual Activity  . Alcohol use: No  . Drug use: No  . Sexual activity: Not on file  Lifestyle  . Physical activity:    Days per week: Not on file    Minutes per session: Not on file  . Stress: Not on file  Relationships  . Social connections:    Talks on phone: Not on file    Gets together: Not on file    Attends religious service: Not on file    Active member of club or organization: Not on file    Attends meetings of clubs or organizations: Not on file    Relationship status: Not on file  . Intimate partner violence:    Fear of current or ex partner: Not on file    Emotionally abused: Not on file    Physically abused: Not on file    Forced sexual activity: Not on file  Other Topics Concern  . Not on file  Social History Narrative   Lives with his wife.     Was in the [employer] for 15 years   Left handed    Past Surgical History:  Procedure Laterality Date  . APPENDECTOMY     age 66  . CATARACT  EXTRACTION, BILATERAL  2003   Dr Ishmael Holter (now seen @ [place])  . CHOLECYSTECTOMY  1978   stones  . COLONOSCOPY W/ POLYPECTOMY  06/2009   Dr.Jacobs; "miniscule polyp"  . HERNIA REPAIR    . INGUINAL HERNIA REPAIR     age 58  . SKIN BIOPSY  12/2014   done by dermatologist   . TONSILLECTOMY AND ADENOIDECTOMY    . UPPER GI ENDOSCOPY  05/2014   negative    Family History  Problem Relation Age of Onset  . Stroke Mother 29  . Parkinsonism Brother        Brother died at age at age 39  . Pulmonary embolism Father 104       Post Op  . Diabetes Neg Hx   . Cancer Neg Hx   . COPD Neg Hx   . Heart disease Neg Hx     Allergies  Allergen Reactions  . Amoxicillin     urticaria  . Codeine     Rash Because of a history of documented adverse serious drug reaction;Medi Alert bracelet  is recommended  . Minocycline Hcl     REACTION: nervous/chest pressure (Note: may have affected GE  valve)  . Astelin [Azelastine Hcl]     Makes me jittery  . Azelastine Other (See Comments)  . Ether     pneumonia  . Zocor [Simvastatin] Other (See Comments)    Itching w/o, muscle problems and it affected his stomach  . Metoprolol     lightheaded    Current Outpatient Medications on File Prior to Visit  Medication Sig Dispense Refill  . aspirin EC 81 MG tablet Take 81 mg by mouth daily.     . Cholecalciferol (VITAMIN D) 1000 UNITS capsule Take 1,000 Units by mouth every morning.     Water engineer Bandages & Supports (ABDOMINAL BINDER/ELASTIC LARGE) MISC Large or appropriate size Large abdominal hernia 1 each 0  . finasteride (PROSCAR) 5 MG tablet Take 1 tablet (5 mg total) by mouth daily. 90 tablet 3  . losartan (COZAAR) 50 MG tablet TAKE 1 TABLET BY MOUTH EVERY DAY 90 tablet 3  . metroNIDAZOLE (METROCREAM) 0.75 % cream Apply 1 application topically as needed. Apply to face prn    . Multiple Vitamin (MULTIVITAMIN WITH MINERALS) TABS Take 1 tablet by mouth every morning.     . nystatin cream (MYCOSTATIN) APPLY TO AFFECTED AREA TWICE A DAY 30 g 0  . simethicone (MYLICON) 80 MG chewable tablet Chew 1 tablet (80 mg total) by mouth every 6 (six) hours as needed for flatulence. 30 tablet 0  . timolol (TIMOPTIC) 0.5 % ophthalmic solution     . triamcinolone cream (KENALOG) 0.1 % APPLY TO AFFECTED AREA TWICE A DAY 30 g 0  . donepezil (ARICEPT) 5 MG tablet Take 1 tablet (5 mg total) by mouth at bedtime. (Patient not taking: Reported on 03/15/2018) 30 tablet 1   No current facility-administered medications on file prior to visit.     BP 118/72 (BP Location: Left Arm, Patient Position: Sitting, Cuff Size: Normal)   Pulse 68   Temp 98.5 F (36.9 C) (Oral)   Resp 16   Wt 143 lb 3.2 oz (65 kg)   SpO2 98%   BMI 23.83 kg/m       Objective:   Physical Exam  Constitutional: He is oriented to person, place, and time. He appears well-developed and well-nourished. No distress.  HENT:  Head:  Normocephalic and atraumatic.  Right Ear:  External ear normal.  Left Ear: External ear normal.  Nose: Nose normal.  Mouth/Throat: Oropharynx is clear and moist. No oropharyngeal exudate.  No cerumen impaction noted to bilateral ears.  No signs of infection noted  Cardiovascular: Normal rate, regular rhythm, normal heart sounds and intact distal pulses. Exam reveals no gallop and no friction rub.  No murmur heard. Pulmonary/Chest: Effort normal and breath sounds normal. No respiratory distress. He has no wheezes. He has no rales.  Musculoskeletal: Normal range of motion. He exhibits no edema, tenderness or deformity.  No pain with palpation to entire lower back  Neurological: He is alert and oriented to person, place, and time.  Skin: Skin is warm and dry. No rash noted. He is not diaphoretic. No erythema. No pallor.  Psychiatric: He has a normal mood and affect. His behavior is normal.  Nursing note and vitals reviewed.     Assessment & Plan:  1. Ear fullness, right -Completely normal right ear  2. Acute right-sided low back pain without sciatica -No pain with palpation.  Patient able to move and change positions easily without discomfort.  Probably muscular pain that has since resolved  [name], NP

## 2018-03-16 ENCOUNTER — Encounter: Payer: Self-pay | Admitting: Adult Health

## 2018-03-16 ENCOUNTER — Telehealth: Payer: Self-pay | Admitting: Neurology

## 2018-03-16 NOTE — Telephone Encounter (Signed)
If she does not answer her house phone she may be out of the house and she would like you to try her on her mobile phone, that number is [phone].

## 2018-03-16 NOTE — Telephone Encounter (Signed)
Tried calling wife back on both numbers (home/mobile). LVM on both for her to call back.

## 2018-03-16 NOTE — Telephone Encounter (Signed)
Called patient's wife. She stated that the patient is very apprehensive about the EEG, so he would prefer to wait and discuss this test and what it entails at his appointment on 5/3 prior to scheduling the EEG.

## 2018-03-16 NOTE — Telephone Encounter (Signed)
Patients wife called and requested to speak with the nurse regarding the EEG that has been ordered. She is worried her husband may not be able to complete it and has questions for the nurse. It has not been scheduled yet. Please call and advise. [phone].

## 2018-03-16 NOTE — Telephone Encounter (Signed)
Pt wife called back. She was under impression test was imaging and IV dye was being used. I educated her that test Dr. Jannifer Franklin ordered was an EEG, it uses no IV dye, no IV needles. This test looks for abnormal electrical activity of the brain. Electrodes are placed on the head to do so. About an hour long test. She verbalized understanding. Advised someone tried to call and schedule this yesterday, they LVM. I will send another message back to have them call to schedule again.

## 2018-03-21 DIAGNOSIS — H04123 Dry eye syndrome of bilateral lacrimal glands: Secondary | ICD-10-CM | POA: Diagnosis not present

## 2018-03-21 DIAGNOSIS — H52203 Unspecified astigmatism, bilateral: Secondary | ICD-10-CM | POA: Diagnosis not present

## 2018-03-21 DIAGNOSIS — H40051 Ocular hypertension, right eye: Secondary | ICD-10-CM | POA: Diagnosis not present

## 2018-03-21 DIAGNOSIS — H4052X1 Glaucoma secondary to other eye disorders, left eye, mild stage: Secondary | ICD-10-CM | POA: Diagnosis not present

## 2018-03-21 DIAGNOSIS — H18453 Nodular corneal degeneration, bilateral: Secondary | ICD-10-CM | POA: Diagnosis not present

## 2018-03-21 DIAGNOSIS — H524 Presbyopia: Secondary | ICD-10-CM | POA: Diagnosis not present

## 2018-03-23 ENCOUNTER — Telehealth: Payer: Self-pay | Admitting: Neurology

## 2018-03-23 NOTE — Telephone Encounter (Signed)
Pt wife(on DPR) is asking for a call to discuss her concerns about pt having EEG.  Pt has developed a fear/dislike for doctors and wife feels it would be extremely difficult to get pt to cooperate in laying still for 75 mins. with  Equipment attached to his head.  Wife is asking to be called after 3 pm today please

## 2018-03-23 NOTE — Telephone Encounter (Signed)
I called the wife, she is concerned that the patient would not tolerate the EEG study, I explained that the EEG is a relatively nontraumatic test, the patient relaxes back on the bed with wires glued to his head.  We will discuss the test when he is seen Friday.

## 2018-03-25 ENCOUNTER — Ambulatory Visit (INDEPENDENT_AMBULATORY_CARE_PROVIDER_SITE_OTHER): Payer: MEDICARE | Admitting: Neurology

## 2018-03-25 ENCOUNTER — Other Ambulatory Visit: Payer: Self-pay

## 2018-03-25 ENCOUNTER — Encounter: Payer: Self-pay | Admitting: Neurology

## 2018-03-25 ENCOUNTER — Other Ambulatory Visit: Payer: Self-pay | Admitting: Neurology

## 2018-03-25 VITALS — BP 154/76 | HR 55 | Ht 64.0 in | Wt 142.0 lb

## 2018-03-25 DIAGNOSIS — R4701 Aphasia: Secondary | ICD-10-CM | POA: Diagnosis not present

## 2018-03-25 NOTE — Progress Notes (Signed)
Reason for visit: Aphasia  Jack Noble is an 84 y.o. male  History of present illness:  Mr. Jack Noble is an 84 year old left-handed white male with a history of encephalitis and resultant aphasia.  The patient comes in today for evaluation of an event that occurred around 14 March 2018.  It was my understanding that there was some transient confusion or alteration in behavior, but upon questioning of the wife she indicates that he just did not feel well that day.  There was no alteration in sensorium, he was not confused, he had no alteration in his strength or balance.  He was not running fevers.  He did not feel like eating that morning.  EMS was called, they evaluated him and felt that he was doing well, he did not go to the hospital.  The patient has been doing well since that time.  He returns to this office for an evaluation.  Past Medical History:  Diagnosis Date  . Allergy   . Aphasia 12/26/2013   Dr Jannifer Franklin  . Cataracts, bilateral   . Corneal abrasion    Dr. Ned Clines, [facility] Op[facility]  . Dermatophytosis of nail   . Diverticulosis   . Epistaxis   . Glaucoma    Dr. Edilia Bo  . Glaucoma   . Hearing loss   . History of GI diverticular bleed 710/15  . Hyperlipidemia   . Hyperplasia of prostate without urinary obstruction   . Hypertension   . Rosacea    Dr. Marge Duncans  . Sepsis due to Escherichia coli (E. coli) ([place]) 05/2011   Morganella also cultured  . TIA (transient ischemic attack) 9/6-05/2012   BP 221/99    Past Surgical History:  Procedure Laterality Date  . APPENDECTOMY     age 35  . CATARACT EXTRACTION, BILATERAL  2003   Dr Ishmael Holter (now seen @ [place])  . CHOLECYSTECTOMY  1978   stones  . COLONOSCOPY W/ POLYPECTOMY  06/2009   Dr.Jacobs; "miniscule polyp"  . HERNIA REPAIR    . INGUINAL HERNIA REPAIR     age 82  . SKIN BIOPSY  12/2014   done by dermatologist   . TONSILLECTOMY AND ADENOIDECTOMY    . UPPER GI ENDOSCOPY  05/2014   negative    Family History    Problem Relation Age of Onset  . Stroke Mother 75  . Parkinsonism Brother        Brother died at age at age 57  . Pulmonary embolism Father 1       Post Op  . Diabetes Neg Hx   . Cancer Neg Hx   . COPD Neg Hx   . Heart disease Neg Hx     Social history:  reports that he has never smoked. He has never used smokeless tobacco. He reports that he does not drink alcohol or use drugs.    Allergies  Allergen Reactions  . Amoxicillin     urticaria  . Codeine     Rash Because of a history of documented adverse serious drug reaction;Medi Alert bracelet  is recommended  . Minocycline Hcl     REACTION: nervous/chest pressure (Note: may have affected GE valve)  . Astelin [Azelastine Hcl]     Makes me jittery  . Azelastine Other (See Comments)  . Ether     pneumonia  . Zocor [Simvastatin] Other (See Comments)    Itching w/o, muscle problems and it affected his stomach  . Metoprolol     lightheaded  Medications:  Prior to Admission medications   Medication Sig Start Date End Date Taking? Authorizing Provider  aspirin EC 81 MG tablet Take 81 mg by mouth daily.    Yes [provider]  Cholecalciferol (VITAMIN D) 1000 UNITS capsule Take 1,000 Units by mouth every morning.    Yes [provider]  donepezil (ARICEPT) 5 MG tablet Take 1 tablet (5 mg total) by mouth at bedtime. 09/10/17  Yes Kathrynn Ducking, MD  Elastic Bandages & Supports (ABDOMINAL BINDER/ELASTIC LARGE) MISC Large or appropriate size Large abdominal hernia 08/10/16  Yes Burns, Claudina Lick, MD  finasteride (PROSCAR) 5 MG tablet Take 1 tablet (5 mg total) by mouth daily. 03/04/17  Yes Nafziger, Tommi Rumps, NP  losartan (COZAAR) 50 MG tablet TAKE 1 TABLET BY MOUTH EVERY DAY 12/28/17  Yes Nafziger, Tommi Rumps, NP  metroNIDAZOLE (METROCREAM) 0.75 % cream Apply 1 application topically as needed. Apply to face prn 03/01/18  Yes [provider]  Multiple Vitamin (MULTIVITAMIN WITH MINERALS) TABS Take 1 tablet by mouth  every morning.    Yes [provider]  nystatin cream (MYCOSTATIN) APPLY TO AFFECTED AREA TWICE A DAY 01/10/18  Yes Nafziger, Tommi Rumps, NP  simethicone (MYLICON) 80 MG chewable tablet Chew 1 tablet (80 mg total) by mouth every 6 (six) hours as needed for flatulence. 06/03/14  Yes Kinnie Feil, MD  timolol (TIMOPTIC) 0.5 % ophthalmic solution  01/10/18  Yes [provider]  triamcinolone cream (KENALOG) 0.1 % APPLY TO AFFECTED AREA TWICE A DAY 01/10/18  Yes Nafziger, Tommi Rumps, NP    ROS:  Out of a complete 14 system review of symptoms, the patient complains only of the following symptoms, and all other reviewed systems are negative.  Frequency of urination  Blood pressure (!) 154/76, pulse (!) 55, height 5\' 4"  (1.626 m), weight 142 lb (64.4 kg).  Physical Exam  General: The patient is alert and cooperative at the time of the examination.  Skin: No significant peripheral edema is noted.   Neurologic Exam  Mental status: The patient is alert and cooperative at the time of the examination. The patient is quite a phasic, unable to generate centrical speech or understand verbal commands..   Cranial nerves: Facial symmetry is present. Speech is aphasic. Extraocular movements are full. Visual fields are full.  Motor: The patient has good strength in all 4 extremities.  Sensory examination: Soft touch sensation is symmetric on the face, arms, and legs.  Coordination: The patient has good finger-nose-finger and heel-to-shin bilaterally.  Gait and station: The patient has a normal gait. Romberg is negative. No drift is seen.  Reflexes: Deep tendon reflexes are symmetric.   Assessment/Plan:  1.  Aphasia  The patient is at his usual mental baseline, he remains quite aphasic, there have been no changes in mental status, strength, or balance.  He will follow-up for his next scheduled revisit.  We will discontinue the order for the EEG study.  Jill Alexanders MD 03/25/2018  11:41 AM  [facility] [address] [place], [place] [address]  Phone [phone] Fax [phone]

## 2018-03-27 ENCOUNTER — Other Ambulatory Visit: Payer: Self-pay | Admitting: Adult Health

## 2018-03-29 NOTE — Telephone Encounter (Signed)
Ok to send in for one year  

## 2018-03-29 NOTE — Telephone Encounter (Signed)
Does pt need yearly visit?

## 2018-03-29 NOTE — Telephone Encounter (Signed)
Sent to the pharmacy by e-scribe. 

## 2018-04-05 ENCOUNTER — Ambulatory Visit (INDEPENDENT_AMBULATORY_CARE_PROVIDER_SITE_OTHER): Payer: MEDICARE | Admitting: Family Medicine

## 2018-04-05 ENCOUNTER — Encounter: Payer: Self-pay | Admitting: Family Medicine

## 2018-04-05 VITALS — BP 134/66 | HR 62 | Temp 98.2°F | Ht 64.0 in | Wt 140.5 lb

## 2018-04-05 DIAGNOSIS — R4701 Aphasia: Secondary | ICD-10-CM | POA: Diagnosis not present

## 2018-04-05 DIAGNOSIS — W19XXXA Unspecified fall, initial encounter: Secondary | ICD-10-CM | POA: Diagnosis not present

## 2018-04-05 NOTE — Progress Notes (Addendum)
Subjective:    Patient ID: Jack Noble, male    DOB: 06-Jun-1929, 84 y.o.   MRN: [id number]  Chief Complaint  Patient presents with  . Fall    Patient's wife stated that, patient fell on his left side today at 3pm  Pt accompanied by his wife.  HPI Patient was seen today for acute concern.  Pt fell today at 3 pm while at the [employer].  He fell onto his L side and hands after a door hit his foot tripping him up.  Pt landed on the concrete floor that was covered by carpet.  He was helped up by ppl in the store.  Pt denies any pain and was able to weight bear/walk on his own after the incident.  At baseline pt has aphasia.  Pt's wife does not note any changes in pt since the fall but wanted to have him checked to make sure he was ok.    Past Medical History:  Diagnosis Date  . Allergy   . Aphasia 12/26/2013   Dr Jannifer Franklin  . Cataracts, bilateral   . Corneal abrasion    Dr. Ned Clines, [facility] Op[facility]  . Dermatophytosis of nail   . Diverticulosis   . Epistaxis   . Glaucoma    Dr. Edilia Bo  . Glaucoma   . Hearing loss   . History of GI diverticular bleed 710/15  . Hyperlipidemia   . Hyperplasia of prostate without urinary obstruction   . Hypertension   . Rosacea    Dr. Marge Duncans  . Sepsis due to Escherichia coli (E. coli) ([place]) 05/2011   Morganella also cultured  . TIA (transient ischemic attack) 9/6-05/2012   BP 221/99    Allergies  Allergen Reactions  . Amoxicillin     urticaria  . Codeine     Rash Because of a history of documented adverse serious drug reaction;Medi Alert bracelet  is recommended  . Minocycline Hcl     REACTION: nervous/chest pressure (Note: may have affected GE valve)  . Astelin [Azelastine Hcl]     Makes me jittery  . Azelastine Other (See Comments)  . Ether     pneumonia  . Zocor [Simvastatin] Other (See Comments)    Itching w/o, muscle problems and it affected his stomach  . Metoprolol     lightheaded    ROS General: Denies fever, chills, night  sweats, changes in weight, changes in appetite HEENT: Denies headaches, ear pain, changes in vision, rhinorrhea, sore throat CV: Denies CP, palpitations, SOB, orthopnea Pulm: Denies SOB, cough, wheezing GI: Denies abdominal pain, nausea, vomiting, diarrhea, constipation GU: Denies dysuria, hematuria, frequency, vaginal discharge Msk: Denies muscle cramps, joint pains Neuro: Denies weakness, numbness, tingling Skin: Denies rashes, bruising Psych: Denies depression, anxiety, hallucinations     Objective:    Blood pressure 134/66, pulse 62, temperature 98.2 F (36.8 C), temperature source Oral, height 5\' 4"  (1.626 m), weight 140 lb 8 oz (63.7 kg), SpO2 96 %.   Gen. Pleasant, well-nourished, in no distress, normal affect HEENT: Aphasia, [place]/AT, face symmetric, conjunctiva clear, no scleral icterus, PERRLA, nares patent without drainage. Lungs: no accessory muscle use Cardiovascular: RRR, no m/r/g, no peripheral edema Musculoskeletal: No deformities, no cyanosis or clubbing, normal tone.  No TTP of b/l knees, Spine, or hips.  Normal ROM of hips, knees b/l. Neuro:  A&Ox3, CN II-XII intact, normal gait Skin:  Warm, no lesions/ rash, no ecchymosis, erythema, edema, or lacerations of knees or hips b/l.   Wt Readings from  Last 3 Encounters:  04/05/18 140 lb 8 oz (63.7 kg)  03/25/18 142 lb (64.4 kg)  03/15/18 143 lb 3.2 oz (65 kg)    Lab Results  Component Value Date   WBC 6.1 11/10/2017   HGB 13.0 11/10/2017   HCT 39.0 11/10/2017   PLT 203.0 11/10/2017   GLUCOSE 111 (H) 11/07/2017   CHOL 134 05/18/2013   TRIG 89.0 05/18/2013   HDL 52.50 05/18/2013   LDLCALC 64 05/18/2013   ALT 15 (L) 11/07/2017   AST 24 11/07/2017   NA 136 11/07/2017   K 3.8 11/07/2017   CL 105 11/07/2017   CREATININE 0.61 11/07/2017   BUN 12 11/07/2017   CO2 26 11/07/2017   TSH 1.21 08/14/2015   PSA 1.02 05/07/2011   INR 0.99 06/01/2014   HGBA1C 5.5 12/11/2013    Assessment/Plan:  Fall, initial  encounter  -No injury noted on exam -will proceed with xray given pt's aphasia. -supportive care - Plan: XR HIP UNILAT W OR W/O PELVIS 2-3 VIEWS LEFT -will call pt's with results -given RTC or ED precautions.    Grier Mitts, MD

## 2018-04-05 NOTE — Patient Instructions (Signed)
[place] Office [place]  [phone]

## 2018-04-07 ENCOUNTER — Ambulatory Visit (INDEPENDENT_AMBULATORY_CARE_PROVIDER_SITE_OTHER): Payer: MEDICARE

## 2018-04-07 ENCOUNTER — Telehealth: Payer: Self-pay | Admitting: Family Medicine

## 2018-04-07 DIAGNOSIS — R4701 Aphasia: Secondary | ICD-10-CM

## 2018-04-07 DIAGNOSIS — W19XXXA Unspecified fall, initial encounter: Secondary | ICD-10-CM

## 2018-04-07 DIAGNOSIS — M25552 Pain in left hip: Secondary | ICD-10-CM | POA: Diagnosis not present

## 2018-04-07 DIAGNOSIS — S79912A Unspecified injury of left hip, initial encounter: Secondary | ICD-10-CM | POA: Diagnosis not present

## 2018-04-07 NOTE — Telephone Encounter (Signed)
Copied from [place] [id number]. Topic: General - Other >> Apr 06, 2018  4:28 PM Vernona Rieger wrote: Reason for CRM: Patient's wife called to let the office know that she will bring him by tomorrow for his xray. She said she got lost today. She said she is sorry. FYI! >> Apr 07, 2018 10:52 AM Ahmed Prima L wrote: Patient's wife called and said that he is refusing to go for his xray. She said she doesn't know what to do. She said he is not cooperating today. Can the nurse call her and talk with her? She seems upset, margaret [phone].

## 2018-04-08 NOTE — Telephone Encounter (Signed)
X Ray complete.

## 2018-05-23 ENCOUNTER — Ambulatory Visit: Payer: Self-pay | Admitting: *Deleted

## 2018-05-23 NOTE — Telephone Encounter (Signed)
Patient is having abdominal pain- wife has fed him and given him gas medication. Patient had large BM and passed gas- patient was relieved some- enough to rest. Patient has had lunch and is still having the pain.Wife is concerned that her husband may be having hernia pain. She has felt his abdomen/groin and she does not feel any hard areas. She is concerned that she is missing something and request an appointment to check. PCP is out of office- appointment made at wife request for afternoon as they are elderly.  Reason for Disposition . Age > 60 years  Answer Assessment - Initial Assessment Questions 1. LOCATION: "Where does it hurt?"      At waistline- where hernia is 2. RADIATION: "Does the pain shoot anywhere else?" (e.g., chest, back)     No complaints of pain anywhere else 3. ONSET: "When did the pain begin?" (Minutes, hours or days ago)      This morning 4. SUDDEN: "Gradual or sudden onset?"     Sudden onset 5. PATTERN "Does the pain come and go, or is it constant?"    - If constant: "Is it getting better, staying the same, or worsening?"      (Note: Constant means the pain never goes away completely; most serious pain is constant and it progresses)     - If intermittent: "How long does it last?" "Do you have pain now?"     (Note: Intermittent means the pain goes away completely between bouts)     Comes and goes 6. SEVERITY: "How bad is the pain?"  (e.g., Scale 1-10; mild, moderate, or severe)    - MILD (1-3): doesn't interfere with normal activities, abdomen soft and not tender to touch     - MODERATE (4-7): interferes with normal activities or awakens from sleep, tender to touch     - SEVERE (8-10): excruciating pain, doubled over, unable to do any normal activities       Does not hurt as bad- mild 7. RECURRENT SYMPTOM: "Have you ever had this type of abdominal pain before?" If so, ask: "When was the last time?" and "What happened that time?"      Patient's wife is trying to decide  if gas pain or hernia 8. CAUSE: "What do you think is causing the abdominal pain?"     Possible hernia  9. RELIEVING/AGGRAVATING FACTORS: "What makes it better or worse?" (e.g., movement, antacids, bowel movement)     Today gas medication helped the severe pain- but they request appointment to check 10. OTHER SYMPTOMS: "Has there been any vomiting, diarrhea, constipation, or urine problems?"       No  Protocols used: ABDOMINAL PAIN - MALE-A-AH

## 2018-05-23 NOTE — Telephone Encounter (Signed)
Noted  

## 2018-05-24 ENCOUNTER — Ambulatory Visit: Payer: MEDICARE | Admitting: Family Medicine

## 2018-06-01 ENCOUNTER — Encounter: Payer: Self-pay | Admitting: Adult Health

## 2018-06-01 ENCOUNTER — Ambulatory Visit (INDEPENDENT_AMBULATORY_CARE_PROVIDER_SITE_OTHER): Payer: MEDICARE | Admitting: Adult Health

## 2018-06-01 VITALS — BP 134/64 | Temp 98.4°F | Wt 140.0 lb

## 2018-06-01 DIAGNOSIS — R350 Frequency of micturition: Secondary | ICD-10-CM

## 2018-06-01 DIAGNOSIS — R4701 Aphasia: Secondary | ICD-10-CM

## 2018-06-01 LAB — POC URINALSYSI DIPSTICK (AUTOMATED)
BILIRUBIN UA: NEGATIVE
GLUCOSE UA: NEGATIVE
KETONES UA: NEGATIVE
LEUKOCYTES UA: NEGATIVE
Nitrite, UA: NEGATIVE
Protein, UA: NEGATIVE
RBC UA: NEGATIVE
SPEC GRAV UA: 1.02 (ref 1.010–1.025)
UROBILINOGEN UA: 0.2 U/dL
pH, UA: 6 (ref 5.0–8.0)

## 2018-06-01 NOTE — Progress Notes (Addendum)
Subjective:    Patient ID: Jack Noble, male    DOB: Jul 11, 1929, 84 y.o.   MRN: [id number]  HPI 84 year old male who  has a past medical history of Allergy, Aphasia (12/26/2013), Cataracts, bilateral, Corneal abrasion, Dermatophytosis of nail, Diverticulosis, Epistaxis, Glaucoma, Glaucoma, Hearing loss, History of GI diverticular bleed (710/15), Hyperlipidemia, Hyperplasia of prostate without urinary obstruction, Hypertension, Rosacea, Sepsis due to Escherichia coli (E. coli) ([place]) (05/2011), and TIA (transient ischemic attack) (9/6-05/2012).  He presents with his wife today for the concern of transient episodes of confusion and intermittent issues with urinary frequency.  Upon further questioning his wife reports that she thought that "Jack Noble just was not feeling well that day" and she became concerned.  Reports that he did not seem any more confused than he normally is.  Had no fevers, nausea, vomiting, or diarrhea.   She reports that sometimes he will be out doing errands with her and will have to get to the restroom very quickly or there is a chance that he is going to have incontinence.  She denies him complaining of any burning with urination abnormal urine odor.   Review of Systems See HPI   Past Medical History:  Diagnosis Date  . Allergy   . Aphasia 12/26/2013   Dr Jannifer Franklin  . Cataracts, bilateral   . Corneal abrasion    Dr. Ned Clines, [facility]  . Dermatophytosis of nail   . Diverticulosis   . Epistaxis   . Glaucoma    Dr. Edilia Bo  . Glaucoma   . Hearing loss   . History of GI diverticular bleed 710/15  . Hyperlipidemia   . Hyperplasia of prostate without urinary obstruction   . Hypertension   . Rosacea    Dr. Marge Duncans  . Sepsis due to Escherichia coli (E. coli) ([place]) 05/2011   Morganella also cultured  . TIA (transient ischemic attack) 9/6-05/2012   BP 221/99    Social History   Socioeconomic History  . Marital status: Married    Spouse name: Not on file  . Number  of children: 1  . Years of education: college  . Highest education level: Not on file  Occupational History  . Occupation: [employer]    Employer: RETIRED  Social Needs  . Financial resource strain: Not on file  . Food insecurity:    Worry: Not on file    Inability: Not on file  . Transportation needs:    Medical: Not on file    Non-medical: Not on file  Tobacco Use  . Smoking status: Never Smoker  . Smokeless tobacco: Never Used  Substance and Sexual Activity  . Alcohol use: No  . Drug use: No  . Sexual activity: Not on file  Lifestyle  . Physical activity:    Days per week: Not on file    Minutes per session: Not on file  . Stress: Not on file  Relationships  . Social connections:    Talks on phone: Not on file    Gets together: Not on file    Attends religious service: Not on file    Active member of club or organization: Not on file    Attends meetings of clubs or organizations: Not on file    Relationship status: Not on file  . Intimate partner violence:    Fear of current or ex partner: Not on file    Emotionally abused: Not on file    Physically abused: Not on file  Forced sexual activity: Not on file  Other Topics Concern  . Not on file  Social History Narrative   Lives with his wife.     Was in the [employer] for 15 years   Left handed    Past Surgical History:  Procedure Laterality Date  . APPENDECTOMY     age 8  . CATARACT EXTRACTION, BILATERAL  2003   Dr Ishmael Holter (now seen @ [place])  . CHOLECYSTECTOMY  1978   stones  . COLONOSCOPY W/ POLYPECTOMY  06/2009   Dr.Jacobs; "miniscule polyp"  . HERNIA REPAIR    . INGUINAL HERNIA REPAIR     age 34  . SKIN BIOPSY  12/2014   done by dermatologist   . TONSILLECTOMY AND ADENOIDECTOMY    . UPPER GI ENDOSCOPY  05/2014   negative    Family History  Problem Relation Age of Onset  . Stroke Mother 40  . Parkinsonism Brother        Brother died at age at age 57  . Pulmonary embolism Father  93       Post Op  . Diabetes Neg Hx   . Cancer Neg Hx   . COPD Neg Hx   . Heart disease Neg Hx     Allergies  Allergen Reactions  . Amoxicillin     urticaria  . Codeine     Rash Because of a history of documented adverse serious drug reaction;Medi Alert bracelet  is recommended  . Minocycline Hcl     REACTION: nervous/chest pressure (Note: may have affected GE valve)  . Astelin [Azelastine Hcl]     Makes me jittery  . Azelastine Other (See Comments)  . Ether     pneumonia  . Zocor [Simvastatin] Other (See Comments)    Itching w/o, muscle problems and it affected his stomach  . Metoprolol     lightheaded    Current Outpatient Medications on File Prior to Visit  Medication Sig Dispense Refill  . aspirin EC 81 MG tablet Take 81 mg by mouth daily.     . Cholecalciferol (VITAMIN D) 1000 UNITS capsule Take 1,000 Units by mouth every morning.     . donepezil (ARICEPT) 5 MG tablet TAKE 1 TABLET BY MOUTH EVERYDAY AT BEDTIME 30 tablet 6  . Elastic Bandages & Supports (ABDOMINAL BINDER/ELASTIC LARGE) MISC Large or appropriate size Large abdominal hernia 1 each 0  . finasteride (PROSCAR) 5 MG tablet TAKE 1 TABLET BY MOUTH DAILY 90 tablet 3  . losartan (COZAAR) 50 MG tablet TAKE 1 TABLET BY MOUTH EVERY DAY 90 tablet 3  . metroNIDAZOLE (METROCREAM) 0.75 % cream Apply 1 application topically as needed. Apply to face prn    . Multiple Vitamin (MULTIVITAMIN WITH MINERALS) TABS Take 1 tablet by mouth every morning.     . nystatin cream (MYCOSTATIN) APPLY TO AFFECTED AREA TWICE A DAY 30 g 0  . simethicone (MYLICON) 80 MG chewable tablet Chew 1 tablet (80 mg total) by mouth every 6 (six) hours as needed for flatulence. 30 tablet 0  . timolol (TIMOPTIC) 0.5 % ophthalmic solution     . triamcinolone cream (KENALOG) 0.1 % APPLY TO AFFECTED AREA TWICE A DAY 30 g 0   No current facility-administered medications on file prior to visit.     BP 134/64   Temp 98.4 F (36.9 C) (Oral)   Wt 140 lb  (63.5 kg)   BMI 24.03 kg/m       Objective:   Physical  Exam  Constitutional: He appears well-developed and well-nourished. No distress.  Eyes: Pupils are equal, round, and reactive to light. EOM are normal.  Cardiovascular: Normal rate, regular rhythm, normal heart sounds and intact distal pulses. Exam reveals no gallop and no friction rub.  No murmur heard. Pulmonary/Chest: Effort normal and breath sounds normal. No stridor. No respiratory distress. He has no wheezes. He has no rales. He exhibits no tenderness.  Abdominal: A hernia is present. Hernia confirmed positive in the ventral area.  Neurological: He is alert. He has normal strength. Gait normal.   aphasic but at baseline. Is able to follow commands   Skin: Skin is warm and dry. He is not diaphoretic.  Psychiatric: He has a normal mood and affect. His behavior is normal. Judgment and thought content normal.  Nursing note and vitals reviewed.     Assessment & Plan:  He presents today at his usual baseline, quite aphasic.  There does not appear to be any changes in his mental status, strength, or gait.  Urinalysis was negative for UTI.  Reassured patient and his wife of findings.  [name], NP

## 2018-06-06 ENCOUNTER — Encounter: Payer: Self-pay | Admitting: Family Medicine

## 2018-06-06 ENCOUNTER — Ambulatory Visit (INDEPENDENT_AMBULATORY_CARE_PROVIDER_SITE_OTHER): Payer: MEDICARE | Admitting: Family Medicine

## 2018-06-06 VITALS — BP 130/66 | HR 66 | Temp 98.8°F | Resp 16 | Ht 64.0 in | Wt 139.5 lb

## 2018-06-06 DIAGNOSIS — R1033 Periumbilical pain: Secondary | ICD-10-CM

## 2018-06-06 DIAGNOSIS — K432 Incisional hernia without obstruction or gangrene: Secondary | ICD-10-CM

## 2018-06-06 NOTE — Progress Notes (Signed)
ACUTE VISIT   HPI:  Chief Complaint  Patient presents with  . Hernia pain    c/o pain this morning    Jack Noble is a 84 y.o. male, who is here today with his wife, who provides history today. He has history of aphasia and has difficulty describing symptoms but he can answer Y/N questions.  According to his wife, this morning when he got up he started with abdominal pain around incisional hernia, pain seemed to be severe. Negative for fever, chills, changes in bowel habits, blood in the stool, nausea, vomiting, or urinary symptoms. No MS changes. Last bowel movement yesterday. Last night he placed a bowl in cabinet, he had to reach up to it.  He did not have any problem at this time but she thinks this might have caused pain.  Pain has improved, now it is mild.  According to wife, he has had an incisional hernia since early 1980s, he is not a surgical candidate.  In the past she has had similar episodes of abdominal pain, usually it is alleviated by lying on his back and passing gas, which did not seem to help this morning. He also has tried X gas in the past. He has not tried OTC medication today.  He has not identified abdominal pain exacerbating factors. Hernia becomes more evident when he is upright and reduces when lying in bed,supine.  His wife has tried to get a special" belt" that was recommended by surgeon in the past but she has not been able to find one in that fits well.   Review of Systems  Constitutional: Negative for activity change, appetite change, fatigue and fever.  Eyes: Negative for redness and visual disturbance.  Respiratory: Negative for cough, shortness of breath and wheezing.   Cardiovascular: Negative for chest pain, palpitations and leg swelling.  Gastrointestinal: Positive for abdominal pain. Negative for blood in stool, constipation, nausea and vomiting.  Genitourinary: Negative for decreased urine volume, dysuria and  hematuria.  Musculoskeletal: Positive for gait problem. Negative for back pain and myalgias.  Skin: Negative for pallor and rash.  Neurological: Negative for syncope, weakness and headaches.  Psychiatric/Behavioral: Negative for agitation and confusion.      Current Outpatient Medications on File Prior to Visit  Medication Sig Dispense Refill  . aspirin EC 81 MG tablet Take 81 mg by mouth daily.     . Cholecalciferol (VITAMIN D) 1000 UNITS capsule Take 1,000 Units by mouth every morning.     . donepezil (ARICEPT) 5 MG tablet TAKE 1 TABLET BY MOUTH EVERYDAY AT BEDTIME 30 tablet 6  . Elastic Bandages & Supports (ABDOMINAL BINDER/ELASTIC LARGE) MISC Large or appropriate size Large abdominal hernia 1 each 0  . finasteride (PROSCAR) 5 MG tablet TAKE 1 TABLET BY MOUTH DAILY 90 tablet 3  . losartan (COZAAR) 50 MG tablet TAKE 1 TABLET BY MOUTH EVERY DAY 90 tablet 3  . metroNIDAZOLE (METROCREAM) 0.75 % cream Apply 1 application topically as needed. Apply to face prn    . Multiple Vitamin (MULTIVITAMIN WITH MINERALS) TABS Take 1 tablet by mouth every morning.     . nystatin cream (MYCOSTATIN) APPLY TO AFFECTED AREA TWICE A DAY 30 g 0  . simethicone (MYLICON) 80 MG chewable tablet Chew 1 tablet (80 mg total) by mouth every 6 (six) hours as needed for flatulence. 30 tablet 0  . timolol (TIMOPTIC) 0.5 % ophthalmic solution     . triamcinolone cream (KENALOG) 0.1 %  APPLY TO AFFECTED AREA TWICE A DAY 30 g 0   No current facility-administered medications on file prior to visit.      Past Medical History:  Diagnosis Date  . Allergy   . Aphasia 12/26/2013   Dr Jannifer Franklin  . Cataracts, bilateral   . Corneal abrasion    Dr. Ned Clines, [facility] Op[facility]  . Dermatophytosis of nail   . Diverticulosis   . Epistaxis   . Glaucoma    Dr. Edilia Bo  . Glaucoma   . Hearing loss   . History of GI diverticular bleed 710/15  . Hyperlipidemia   . Hyperplasia of prostate without urinary obstruction   . Hypertension   .  Rosacea    Dr. Marge Duncans  . Sepsis due to Escherichia coli (E. coli) ([place]) 05/2011   Morganella also cultured  . TIA (transient ischemic attack) 9/6-05/2012   BP 221/99   Allergies  Allergen Reactions  . Amoxicillin     urticaria  . Codeine     Rash Because of a history of documented adverse serious drug reaction;Medi Alert bracelet  is recommended  . Minocycline Hcl     REACTION: nervous/chest pressure (Note: may have affected GE valve)  . Astelin [Azelastine Hcl]     Makes me jittery  . Azelastine Other (See Comments)  . Ether     pneumonia  . Zocor [Simvastatin] Other (See Comments)    Itching w/o, muscle problems and it affected his stomach  . Metoprolol     lightheaded    Social History   Socioeconomic History  . Marital status: Married    Spouse name: Not on file  . Number of children: 1  . Years of education: college  . Highest education level: Not on file  Occupational History  . Occupation: [employer]    Employer: RETIRED  Social Needs  . Financial resource strain: Not on file  . Food insecurity:    Worry: Not on file    Inability: Not on file  . Transportation needs:    Medical: Not on file    Non-medical: Not on file  Tobacco Use  . Smoking status: Never Smoker  . Smokeless tobacco: Never Used  Substance and Sexual Activity  . Alcohol use: No  . Drug use: No  . Sexual activity: Not on file  Lifestyle  . Physical activity:    Days per week: Not on file    Minutes per session: Not on file  . Stress: Not on file  Relationships  . Social connections:    Talks on phone: Not on file    Gets together: Not on file    Attends religious service: Not on file    Active member of club or organization: Not on file    Attends meetings of clubs or organizations: Not on file    Relationship status: Not on file  Other Topics Concern  . Not on file  Social History Narrative   Lives with his wife.     Was in the [employer] for 15 years   Left handed      Vitals:   06/06/18 1454  BP: 130/66  Pulse: 66  Resp: 16  Temp: 98.8 F (37.1 C)  SpO2: 96%   Body mass index is 23.95 kg/m.   Physical Exam  Nursing note and vitals reviewed. Constitutional: He appears well-developed. He is cooperative. No distress.  He seems comfortable, he is reading a book during part of the visit.  HENT:  Head:  Normocephalic and atraumatic.  Mouth/Throat: Oropharynx is clear and moist and mucous membranes are normal. He has dentures.  Eyes: Conjunctivae are normal.  Cardiovascular: Normal rate and regular rhythm.  No murmur heard. Respiratory: Effort normal and breath sounds normal. No respiratory distress.  GI: Soft. Bowel sounds are normal. He exhibits no mass. There is no tenderness. A hernia is present.    Reducible but not completely due to size and protrudes again with minimal valsalva maneuver.   Musculoskeletal: He exhibits no edema.  Lymphadenopathy:    He has no cervical adenopathy.  Neurological: He is alert.  No focal deficit appreciated. Mildly unstable gait without assistance.  Skin: Skin is warm. No erythema.  Psychiatric: He has a normal mood and affect.  Well-groomed, good eye contact.      ASSESSMENT AND PLAN:   Jack Noble was seen today for hernia pain.  Diagnoses and all orders for this visit:  Periumbilical abdominal pain  Obtaining history is difficult because he cannot provide details in regard to type and level of pain. He feels comfortable, not in acute distress, and abdominal exam is otherwise negative. I do not think work-up is needed today.  Incisional hernia, without obstruction or gangrene  No tenderness upon reducing abdominal hernia.  He could try a girdle to keep the hernia content in place. Avoid heavy lifting or activities that may worsen problem.  Clearly instructed about warning signs,wife voices understanding.   25 min face to face OV. > 50% was dedicated to discussion of differential Dx  and educations about signs of complications.      Return if symptoms worsen or fail to improve.       Shaya Altamura G. [place], MD  [facility] [place] office.

## 2018-06-06 NOTE — Patient Instructions (Signed)
A few things to remember from today's visit:   Periumbilical abdominal pain  Incisional hernia, without obstruction or gangrene    Hernia A hernia happens when an organ or tissue inside your body pushes out through a weak spot in the belly (abdomen). Follow these instructions at home:  Avoid stretching or overusing (straining) the muscles near the hernia.  Do not lift anything heavier than 10 lb (4.5 kg).  Use the muscles in your leg when you lift something up. Do not use the muscles in your back.  When you cough, try to cough gently.  Eat a diet that has a lot of fiber. Eat lots of fruits and vegetables.  Drink enough fluids to keep your pee (urine) clear or pale yellow. Try to drink 6-8 glasses of water a day.  Take medicines to make your poop soft (stool softeners) as told by your doctor.  Lose weight, if you are overweight.  Do not use any tobacco products, including cigarettes, chewing tobacco, or electronic cigarettes. If you need help quitting, ask your doctor.  Keep all follow-up visits as told by your doctor. This is important. Contact a doctor if:  The skin by the hernia gets puffy (swollen) or red.  The hernia is painful. Get help right away if:  You have a fever.  You have belly pain that is getting worse.  You feel sick to your stomach (nauseous) or you throw up (vomit).  You cannot push the hernia back in place by gently pressing on it while you are lying down.  The hernia: ? Changes in shape or size. ? Is stuck outside your belly. ? Changes color. ? Feels hard or tender. This information is not intended to replace advice given to you by your health care provider. Make sure you discuss any questions you have with your health care provider. Document Released: 04/29/2010 Document Revised: 04/16/2016 Document Reviewed: 09/19/2014 Elsevier Interactive Patient Education  [employer].

## 2018-06-08 ENCOUNTER — Ambulatory Visit: Payer: MEDICARE | Admitting: Adult Health

## 2018-06-08 ENCOUNTER — Encounter

## 2018-06-16 ENCOUNTER — Emergency Department (HOSPITAL_COMMUNITY)
Admission: EM | Admit: 2018-06-16 | Discharge: 2018-06-16 | Disposition: A | Discharge status: Home / Self Care | Payer: MEDICARE | Attending: Emergency Medicine | Admitting: Emergency Medicine

## 2018-06-16 ENCOUNTER — Ambulatory Visit: Payer: Self-pay

## 2018-06-16 ENCOUNTER — Emergency Department (HOSPITAL_COMMUNITY): Payer: MEDICARE

## 2018-06-16 ENCOUNTER — Telehealth: Payer: Self-pay | Admitting: Neurology

## 2018-06-16 ENCOUNTER — Encounter (HOSPITAL_COMMUNITY): Payer: Self-pay | Admitting: Emergency Medicine

## 2018-06-16 DIAGNOSIS — R109 Unspecified abdominal pain: Secondary | ICD-10-CM | POA: Diagnosis not present

## 2018-06-16 DIAGNOSIS — E785 Hyperlipidemia, unspecified: Secondary | ICD-10-CM | POA: Diagnosis not present

## 2018-06-16 DIAGNOSIS — K4091 Unilateral inguinal hernia, without obstruction or gangrene, recurrent: Secondary | ICD-10-CM | POA: Insufficient documentation

## 2018-06-16 DIAGNOSIS — I10 Essential (primary) hypertension: Secondary | ICD-10-CM | POA: Insufficient documentation

## 2018-06-16 DIAGNOSIS — Z8673 Personal history of transient ischemic attack (TIA), and cerebral infarction without residual deficits: Secondary | ICD-10-CM | POA: Insufficient documentation

## 2018-06-16 DIAGNOSIS — Z7982 Long term (current) use of aspirin: Secondary | ICD-10-CM | POA: Insufficient documentation

## 2018-06-16 DIAGNOSIS — Z79899 Other long term (current) drug therapy: Secondary | ICD-10-CM | POA: Diagnosis not present

## 2018-06-16 DIAGNOSIS — K4031 Unilateral inguinal hernia, with obstruction, without gangrene, recurrent: Secondary | ICD-10-CM | POA: Diagnosis not present

## 2018-06-16 LAB — CBC
HCT: 40.8 % (ref 39.0–52.0)
Hemoglobin: 14.2 g/dL (ref 13.0–17.0)
MCH: 33.6 pg (ref 26.0–34.0)
MCHC: 34.8 g/dL (ref 30.0–36.0)
MCV: 96.5 fL (ref 78.0–100.0)
Platelets: 244 10*3/uL (ref 150–400)
RBC: 4.23 MIL/uL (ref 4.22–5.81)
RDW: 13 % (ref 11.5–15.5)
WBC: 7.9 10*3/uL (ref 4.0–10.5)

## 2018-06-16 LAB — URINALYSIS, ROUTINE W REFLEX MICROSCOPIC
Bilirubin Urine: NEGATIVE
GLUCOSE, UA: NEGATIVE mg/dL
HGB URINE DIPSTICK: NEGATIVE
Ketones, ur: 5 mg/dL — AB
LEUKOCYTES UA: NEGATIVE
Nitrite: NEGATIVE
Protein, ur: NEGATIVE mg/dL
Specific Gravity, Urine: 1.013 (ref 1.005–1.030)
pH: 7 (ref 5.0–8.0)

## 2018-06-16 LAB — COMPREHENSIVE METABOLIC PANEL
ALBUMIN: 4 g/dL (ref 3.5–5.0)
ALT: 13 U/L (ref 0–44)
ANION GAP: 8 (ref 5–15)
AST: 21 U/L (ref 15–41)
Alkaline Phosphatase: 49 U/L (ref 38–126)
BILIRUBIN TOTAL: 0.8 mg/dL (ref 0.3–1.2)
BUN: 11 mg/dL (ref 8–23)
CHLORIDE: 104 mmol/L (ref 98–111)
CO2: 27 mmol/L (ref 22–32)
Calcium: 9.6 mg/dL (ref 8.9–10.3)
Creatinine, Ser: 0.63 mg/dL (ref 0.61–1.24)
GFR calc Af Amer: >60 mL/min (ref >60)
GFR calc non Af Amer: >60 mL/min (ref >60)
Glucose, Bld: 128 mg/dL — ABNORMAL HIGH (ref 70–99)
POTASSIUM: 3.5 mmol/L (ref 3.5–5.1)
Sodium: 139 mmol/L (ref 135–145)
Total Protein: 6.9 g/dL (ref 6.5–8.1)

## 2018-06-16 LAB — I-STAT CG4 LACTIC ACID, ED: LACTIC ACID, VENOUS: 1.41 mmol/L (ref 0.5–1.9)

## 2018-06-16 LAB — LIPASE, BLOOD: LIPASE: 77 U/L — AB (ref 11–51)

## 2018-06-16 MED ORDER — HYDROMORPHONE HCL 1 MG/ML IJ SOLN
0.5000 mg | Freq: Once | INTRAMUSCULAR | Status: AC
  Administered 2018-06-16: 0.5 mg via INTRAVENOUS
  Filled 2018-06-16: qty 1

## 2018-06-16 MED ORDER — FENTANYL CITRATE (PF) 100 MCG/2ML IJ SOLN
50.0000 ug | INTRAMUSCULAR | Status: DC | PRN
  Administered 2018-06-16: 50 ug via INTRAVENOUS
  Filled 2018-06-16: qty 2

## 2018-06-16 MED ORDER — IOPAMIDOL (ISOVUE-300) INJECTION 61%
100.0000 mL | Freq: Once | INTRAVENOUS | Status: AC | PRN
  Administered 2018-06-16: 100 mL via INTRAVENOUS

## 2018-06-16 MED ORDER — IOPAMIDOL (ISOVUE-300) INJECTION 61%
INTRAVENOUS | Status: AC
  Filled 2018-06-16: qty 100

## 2018-06-16 MED ORDER — ONDANSETRON HCL 4 MG/2ML IJ SOLN
4.0000 mg | Freq: Once | INTRAMUSCULAR | Status: AC
  Administered 2018-06-16: 4 mg via INTRAVENOUS
  Filled 2018-06-16: qty 2

## 2018-06-16 MED ORDER — SODIUM CHLORIDE 0.9 % IV SOLN
Freq: Once | INTRAVENOUS | Status: AC
  Administered 2018-06-16: 18:00:00 via INTRAVENOUS

## 2018-06-16 MED ORDER — MEMANTINE HCL 5 MG PO TABS: ORAL_TABLET | ORAL | 0 refills | Status: DC

## 2018-06-16 NOTE — ED Notes (Signed)
In Ct  

## 2018-06-16 NOTE — ED Provider Notes (Signed)
  Face-to-face evaluation   History: He presents for evaluation of abdominal pain, which started abruptly today after he was lifting some groceries.  There is been no nausea, vomiting or weakness.  Patient has a aphasia but is usually able to communicate his needs.  Physical exam: Alert elderly man who is cooperative and comfortable.  No respiratory distress.  Abdomen soft and nontender.  He was examined after his right inguinal hernia was reduced.  Medical screening examination/treatment/procedure(s) were conducted as a shared visit with non-physician practitioner(s) and myself.  I personally evaluated the patient during the encounter    Daleen Bo, MD 06/17/18 [phone]

## 2018-06-16 NOTE — Telephone Encounter (Signed)
Wife called to report the pt. has been in extreme pain in the mid and upper abdomen, since he woke up this morning.  Stated that the abdomen is distended, and pt. c/o tenderness in area of  An inoperable hernia.  Stated she has attempted to make him more comfortable by doing everything she has been told; "having him lay flat on back, take "anti-gas" medications, drink fluids to hydrate, and make sure he is urinating adequately."  Stated he has not had a BM today, but did have one yesterday.  Stated he is passing gas.  Denied c/o nausea or vomiting.  Stated the patient has aphasia, and so he is not able to communicate his symptoms.  Reported he has not really eaten anything today; only ate part of a muffin; has been sipping on bottled H20.  Temp. 97.5 at present time.  Wife very indecisive on how to take next step.  Advised with his level of pain, would recommend he go to the ER; wife unsure if she can manage taking him to the hospital by herself.  Does not want to call EMS; feels this would not be appropriate.  Stated she will contact a friend that can assist her in taking pt. to the hospital.  Care advice given per protocol.  Wife verb. Understanding.   Reason for Disposition . [1] SEVERE pain AND [2] age > 52  Answer Assessment - Initial Assessment Questions 1. LOCATION: "Where does it hurt?"      Upper mid abdomen 2. RADIATION: "Does the pain shoot anywhere else?" (e.g., chest, back)     Radiates down into lower abdomen 3. ONSET: "When did the pain begin?" (Minutes, hours or days ago)      This morning when he got up today 4. SUDDEN: "Gradual or sudden onset?"     Sudden onset this morning 5. PATTERN "Does the pain come and go, or is it constant?"    - If constant: "Is it getting better, staying the same, or worsening?"      (Note: Constant means the pain never goes away completely; most serious pain is constant and it progresses)     - If intermittent: "How long does it last?" "Do you have pain  now?"     (Note: Intermittent means the pain goes away completely between bouts)     Constant; has been able to nap, but when he awakens the pain reoccurs 6. SEVERITY: "How bad is the pain?"  (e.g., Scale 1-10; mild, moderate, or severe)    - MILD (1-3): doesn't interfere with normal activities, abdomen soft and not tender to touch     - MODERATE (4-7): interferes with normal activities or awakens from sleep, tender to touch     - SEVERE (8-10): excruciating pain, doubled over, unable to do any normal activities       Wife feels he is in extreme pain; stated pt has aphasia, and is not able to communicate. 7. RECURRENT SYMPTOM: "Have you ever had this type of abdominal pain before?" If so, ask: "When was the last time?" and "What happened that time?"      Yes; has known inoperable umbilical hernia 8. CAUSE: "What do you think is causing the abdominal pain?"    "Has an inoperable hernia" 9. RELIEVING/AGGRAVATING FACTORS: "What makes it better or worse?" (e.g., movement, antacids, bowel movement)     Falls asleep for short intervals; when wakes up c/o pain again  10. OTHER SYMPTOMS: "Has there been any vomiting, diarrhea,  constipation, or urine problems?"       Abdomen is distended and tender;  Temp. 97.5; denied any nausea or vomiting.  Protocols used: ABDOMINAL PAIN - MALE-A-AH

## 2018-06-16 NOTE — Telephone Encounter (Signed)
I called the patient, talk with the wife.  The patient has had some increasing problems with episodic confusion, today he came downstairs unable to find his pants but had 4 shirts on.  The patient at times is confused in the morning, but this seems to clear once he is up and about for a while.  The patient may be developing a dementing illness.  He could not tolerate Aricept previously, we will start Namenda at this time.  They will call for a maintenance dose prescription if he is able to tolerate the drug.

## 2018-06-16 NOTE — Telephone Encounter (Signed)
Monitor for ER arrival 

## 2018-06-16 NOTE — Addendum Note (Signed)
Addended by: Kathrynn Ducking on: 06/16/2018 12:52 PM   Modules accepted: Orders

## 2018-06-16 NOTE — Discharge Instructions (Signed)
High fiber diet, plenty of fluids. Take stool softner daily such as miralax, you can purchase it at any pharmacy. Follow up with [facility] [place] [facility]. Return if worsening pain.

## 2018-06-16 NOTE — ED Triage Notes (Signed)
Pt wife reports pt has in-operable hernia and today pt had a lot of abd pains. PCP advised over phone that could be gas, so pt took pills without any relief. Wife denies n/v/d or urinary problems. Pt LBM yesterday and normal.

## 2018-06-16 NOTE — ED Notes (Signed)
Pt has been notified that urine collection is needed. Pt attempted but was unable to produce at this time

## 2018-06-16 NOTE — ED Provider Notes (Signed)
[facility] DEPT Provider Note   CSN: [id number] Arrival date & time: 06/16/18  1724     History   Chief Complaint Chief Complaint  Patient presents with  . Abdominal Pain    HPI Jack Noble is a 84 y.o. male.  HPI Jack Noble is a 84 y.o. male presents to emergency department with complaint of abdominal pain.  Patient states he has had an incisional hernia from prior gallbladder surgery for years.  It was determined that surgical repair of the hernia was not necessary.  He has had prior pain to the area, but states this morning the pain has gotten much worse than normal.  Patient rates pain as 10 out of 10.  He had a bowel movement yesterday, none today.  He denies any nausea or vomiting.  He took his regular medications which have not helped.  Past Medical History:  Diagnosis Date  . Allergy   . Aphasia 12/26/2013   Dr Jannifer Franklin  . Cataracts, bilateral   . Corneal abrasion    Dr. Ned Clines, [facility]  . Dermatophytosis of nail   . Diverticulosis   . Epistaxis   . Glaucoma    Dr. Edilia Bo  . Glaucoma   . Hearing loss   . History of GI diverticular bleed 710/15  . Hyperlipidemia   . Hyperplasia of prostate without urinary obstruction   . Hypertension   . Rosacea    Dr. Marge Duncans  . Sepsis due to Escherichia coli (E. coli) ([place]) 05/2011   Morganella also cultured  . TIA (transient ischemic attack) 9/6-05/2012   BP 221/99    Patient Active Problem List   Diagnosis Date Noted  . Hernia of abdominal cavity 11/07/2017  . Constipation 11/09/2016  . Transient confusion 09/24/2016  . Ventral hernia without obstruction or gangrene 08/10/2016  . Bilateral inguinal hernia 04/06/2016  . External hemorrhoid 02/25/2016  . Confusion 02/25/2016  . Poor balance 02/05/2016  . Right knee pain 02/05/2016  . Essential hypertension, benign 12/26/2015  . Fatigue 09/19/2015  . GERD (gastroesophageal reflux disease) 06/06/2015  . Skin cancer  09/14/2014  . History of GI diverticular bleed 06/08/2014  . Aphasia 12/26/2013  . History of short term memory loss 09/01/2013  . Hypoalbuminemia 08/04/2013  . Severe sepsis ([place]) 07/24/2013  . BPH (benign prostatic hyperplasia) 07/24/2013  . Glaucoma   . DIVERTICULOSIS, COLON 07/17/2009  . COLONIC POLYPS, HX OF 07/17/2009  . ALLERGIC RHINITIS 10/16/2008  . LIPOMAS, MULTIPLE 11/04/2007  . Rosacea 05/12/2007    Past Surgical History:  Procedure Laterality Date  . APPENDECTOMY     age 16  . CATARACT EXTRACTION, BILATERAL  2003   Dr Ishmael Holter (now seen @ [facility])  . CHOLECYSTECTOMY  1978   stones  . COLONOSCOPY W/ POLYPECTOMY  06/2009   Dr.Jacobs; "miniscule polyp"  . HERNIA REPAIR    . INGUINAL HERNIA REPAIR     age 66  . SKIN BIOPSY  12/2014   done by dermatologist   . TONSILLECTOMY AND ADENOIDECTOMY    . UPPER GI ENDOSCOPY  05/2014   negative        Home Medications    Prior to Admission medications   Medication Sig Start Date End Date Taking? Authorizing Provider  aspirin EC 81 MG tablet Take 81 mg by mouth daily.     [provider]  Cholecalciferol (VITAMIN D) 1000 UNITS capsule Take 1,000 Units by mouth every morning.     [provider]  Elastic Bandages & Supports (ABDOMINAL BINDER/ELASTIC LARGE) MISC Large or appropriate size Large abdominal hernia 08/10/16   Binnie Rail, MD  finasteride (PROSCAR) 5 MG tablet TAKE 1 TABLET BY MOUTH DAILY 03/29/18   Nafziger, Tommi Rumps, NP  losartan (COZAAR) 50 MG tablet TAKE 1 TABLET BY MOUTH EVERY DAY 12/28/17   Nafziger, Tommi Rumps, NP  memantine (NAMENDA) 5 MG tablet Take 1 tablet daily for one week, then take 1 tablet twice daily for one week, then take 1 tablet in the morning and 2 in the evening for one week, then take 2 tablets twice daily 06/16/18   Kathrynn Ducking, MD  metroNIDAZOLE (METROCREAM) 0.75 % cream Apply 1 application topically as needed. Apply to face prn 03/01/18   [provider]  Multiple  Vitamin (MULTIVITAMIN WITH MINERALS) TABS Take 1 tablet by mouth every morning.     [provider]  nystatin cream (MYCOSTATIN) APPLY TO AFFECTED AREA TWICE A DAY 01/10/18   Nafziger, Tommi Rumps, NP  simethicone (MYLICON) 80 MG chewable tablet Chew 1 tablet (80 mg total) by mouth every 6 (six) hours as needed for flatulence. 06/03/14   Kinnie Feil, MD  timolol (TIMOPTIC) 0.5 % ophthalmic solution  01/10/18   [provider]  triamcinolone cream (KENALOG) 0.1 % APPLY TO AFFECTED AREA TWICE A DAY 01/10/18   [name], NP    Family History Family History  Problem Relation Age of Onset  . Stroke Mother 40  . Parkinsonism Brother        Brother died at age at age 67  . Pulmonary embolism Father 47       Post Op  . Diabetes Neg Hx   . Cancer Neg Hx   . COPD Neg Hx   . Heart disease Neg Hx     Social History Social History   Tobacco Use  . Smoking status: Never Smoker  . Smokeless tobacco: Never Used  Substance Use Topics  . Alcohol use: No  . Drug use: No     Allergies   Amoxicillin; Codeine; Minocycline hcl; Astelin [azelastine hcl]; Azelastine; Ether; Zocor [simvastatin]; and Metoprolol   Review of Systems Review of Systems  Constitutional: Negative for chills and fever.  Respiratory: Negative for cough, chest tightness and shortness of breath.   Cardiovascular: Negative for chest pain, palpitations and leg swelling.  Gastrointestinal: Positive for abdominal pain. Negative for abdominal distention, diarrhea, nausea and vomiting.  Genitourinary: Negative for dysuria, frequency, hematuria and urgency.  Musculoskeletal: Negative for arthralgias, myalgias, neck pain and neck stiffness.  Skin: Negative for rash.  Allergic/Immunologic: Negative for immunocompromised state.  Neurological: Negative for dizziness, weakness, light-headedness, numbness and headaches.  All other systems reviewed and are negative.    Physical Exam Updated Vital Signs BP (!)  181/70 (BP Location: Left Arm)   Pulse 65   Temp 97.7 F (36.5 C) (Oral)   Resp 19   SpO2 98%   Physical Exam  Constitutional: He appears well-developed and well-nourished. No distress.  HENT:  Head: Normocephalic and atraumatic.  Eyes: Conjunctivae are normal.  Neck: Neck supple.  Cardiovascular: Normal rate, regular rhythm and normal heart sounds.  Pulmonary/Chest: Effort normal. No respiratory distress. He has no wheezes. He has no rales.  Abdominal: Soft. Bowel sounds are normal. He exhibits no distension. There is tenderness. There is no rebound.  Large incisional hernia in the right upper abdomen.  It is tender to palpation.  Bowel sounds present.  Unable to reduce, family states that always  out like that.  Musculoskeletal: He exhibits no edema.  Neurological: He is alert.  Skin: Skin is warm and dry.  Nursing note and vitals reviewed.    ED Treatments / Results  Labs (all labs ordered are listed, but only abnormal results are displayed) Labs Reviewed  LIPASE, BLOOD - Abnormal; Notable for the following components:      Result Value   Lipase 77 (*)    All other components within normal limits  COMPREHENSIVE METABOLIC PANEL - Abnormal; Notable for the following components:   Glucose, Bld 128 (*)    All other components within normal limits  URINALYSIS, ROUTINE W REFLEX MICROSCOPIC - Abnormal; Notable for the following components:   Color, Urine STRAW (*)    Ketones, ur 5 (*)    All other components within normal limits  CBC  I-STAT CG4 LACTIC ACID, ED    EKG None  Radiology Ct Abdomen Pelvis W Contrast  Result Date: 06/16/2018 CLINICAL DATA:  Abdominal pain today.  Inoperable hernia. EXAM: CT ABDOMEN AND PELVIS WITH CONTRAST TECHNIQUE: Multidetector CT imaging of the abdomen and pelvis was performed using the standard protocol following bolus administration of intravenous contrast. CONTRAST:  145mL ISOVUE-300 IOPAMIDOL (ISOVUE-300) INJECTION 61% COMPARISON:   11/07/2017. FINDINGS: Lower chest: Small hiatal hernia. Atheromatous coronary artery calcifications. Minimal bilateral dependent atelectasis. Minimal right lower lobe bullous change. Hepatobiliary: Cholecystectomy clips.  Unremarkable liver. Pancreas: Unremarkable. No pancreatic ductal dilatation or surrounding inflammatory changes. Spleen: Normal in size without focal abnormality. Adrenals/Urinary Tract: Adrenal glands are unremarkable. Kidneys are normal, without renal calculi, focal lesion, or hydronephrosis. Bladder is unremarkable. Stomach/Bowel: Multiple mildly dilated loops of jejunum. Normal caliber distal small bowel and colon. Large number of colonic diverticula without evidence of diverticulitis. Herniated transverse colon within a moderately large ventral hernia without colon dilatation or wall thickening. Herniated small bowel in a large right inguinal hernia. The small bowel is dilated leading into the hernia and not dilated after exiting the hernia. There is fluid in the small bowel loops within the hernia. No bowel wall thickening or pneumatosis. Small hiatal hernia. Surgically absent appendix. Vascular/Lymphatic: Atheromatous arterial calcifications without aneurysm. No enlarged lymph nodes. Reproductive: Moderate prostatic enlargement with coarse calcifications. Other: Large right inguinal hernia containing herniated small bowel with obstruction, as described above. Moderately large ventral hernia containing herniated transverse colon without obstruction. Second moderately large ventral hernia containing fat, just above the ventral hernia containing herniated colon. Musculoskeletal: Mild lumbar and lower thoracic spine degenerative changes. IMPRESSION: 1. Small-bowel obstruction caused by a large right inguinal hernia containing herniated small bowel. 2. Moderately large ventral hernia containing herniated transverse colon without obstruction. 3. Second moderately large ventral hernia containing  herniated fat. 4. Small hiatal hernia. 5. Atheromatous coronary artery calcifications. 6. Moderate prostatic hypertrophy. Electronically Signed   By: Claudie Revering M.D.   On: 06/16/2018 19:36    Procedures Procedures (including critical care time)  Medications Ordered in ED Medications  fentaNYL (SUBLIMAZE) injection 50 mcg (50 mcg Intravenous Given 06/16/18 1823)  iopamidol (ISOVUE-300) 61 % injection (has no administration in time range)  ondansetron (ZOFRAN) injection 4 mg (4 mg Intravenous Given 06/16/18 1823)  0.9 %  sodium chloride infusion ( Intravenous Stopped 06/16/18 2215)  iopamidol (ISOVUE-300) 61 % injection 100 mL (100 mLs Intravenous Contrast Given 06/16/18 1912)  HYDROmorphone (DILAUDID) injection 0.5 mg (0.5 mg Intravenous Given 06/16/18 1929)     Initial Impression / Assessment and Plan / ED Course  I have reviewed the triage vital  signs and the nursing notes.  Pertinent labs & imaging results that were available during my care of the patient were reviewed by me and considered in my medical decision making (see chart for details).     Shunt with pain over the incisional hernia.  Concerning for strangulate hernia at this time, I am unable to reduce it at this time, however patient and family state that hernia has always been out and nobody has ever been able to push it in but is normally not as tender.  I will get labs, will give him pain medications, will get CT abdomen pelvis for further evaluation.  CT is concerning for small bowel obstruction due to a right inguinal hernia.  I was able to reduce the hernia with gentle pressure.  Patient felt much better.  Discussed with Dr. Eulis Foster, we will feed patient and reassess him.  His lactate and white blood cell count is normal.  Do not think he is got ischemic bowel at this time.     Patient was able to eat and drink without difficulty.  He feels much better.  Patient was also assessed by Dr. Eulis Foster, comfortable with discharge  home.  We will have him follow-up with general surgery.  Increased fiber diet. Daily laxatives.  Return precautions discussed  Vitals:   06/16/18 1740 06/16/18 2144  BP: (!) 181/70 (!) 122/57  Pulse: 65 (!) 54  Resp: 19 16  Temp: 97.7 F (36.5 C) 97.7 F (36.5 C)  TempSrc: Oral Oral  SpO2: 98% 99%     Final Clinical Impressions(s) / ED Diagnoses   Final diagnoses:  Recurrent unilateral inguinal hernia with obstruction and without gangrene    ED Discharge Orders    None       Jeannett Senior, PA-C 06/16/18 2316    Daleen Bo, MD 06/17/18 [phone]

## 2018-06-16 NOTE — Telephone Encounter (Signed)
Pt wife, on DPR(Penza,Margaret [phone]) has called to inform there have been changes in pt that concern her.  Wife states what most concerning is a dressing issue. Example: Pt came downstairs to breakfast with 4 different types of shirts on and his boxer shorts telling his wife he couldn't find his pants.  Wife is not sure is this is to be considered normal or what could be suggested to help pt.  Wife is asking for a call to address her concerns.  Wife is aware of October f/u apointment.  Please call

## 2018-06-17 NOTE — Telephone Encounter (Signed)
I confirmed pt did arrive in the ER yesterday 06/16/2018. 

## 2018-06-21 ENCOUNTER — Emergency Department (HOSPITAL_COMMUNITY): Payer: MEDICARE

## 2018-06-21 ENCOUNTER — Emergency Department (HOSPITAL_COMMUNITY)
Admission: EM | Admit: 2018-06-21 | Discharge: 2018-06-22 | Disposition: A | Discharge status: Home / Self Care | Payer: MEDICARE | Attending: Emergency Medicine | Admitting: Emergency Medicine

## 2018-06-21 ENCOUNTER — Encounter: Payer: Self-pay | Admitting: Adult Health

## 2018-06-21 ENCOUNTER — Other Ambulatory Visit: Payer: Self-pay

## 2018-06-21 ENCOUNTER — Ambulatory Visit (INDEPENDENT_AMBULATORY_CARE_PROVIDER_SITE_OTHER): Payer: MEDICARE | Admitting: Adult Health

## 2018-06-21 ENCOUNTER — Encounter (HOSPITAL_COMMUNITY): Payer: Self-pay | Admitting: Emergency Medicine

## 2018-06-21 VITALS — BP 132/70 | Temp 97.8°F | Wt 141.0 lb

## 2018-06-21 DIAGNOSIS — Z8673 Personal history of transient ischemic attack (TIA), and cerebral infarction without residual deficits: Secondary | ICD-10-CM | POA: Diagnosis not present

## 2018-06-21 DIAGNOSIS — R9431 Abnormal electrocardiogram [ECG] [EKG]: Secondary | ICD-10-CM | POA: Diagnosis not present

## 2018-06-21 DIAGNOSIS — K208 Other esophagitis without bleeding: Secondary | ICD-10-CM

## 2018-06-21 DIAGNOSIS — Z79899 Other long term (current) drug therapy: Secondary | ICD-10-CM | POA: Diagnosis not present

## 2018-06-21 DIAGNOSIS — J984 Other disorders of lung: Secondary | ICD-10-CM | POA: Diagnosis not present

## 2018-06-21 DIAGNOSIS — K209 Esophagitis, unspecified: Secondary | ICD-10-CM | POA: Diagnosis not present

## 2018-06-21 DIAGNOSIS — Z7982 Long term (current) use of aspirin: Secondary | ICD-10-CM | POA: Insufficient documentation

## 2018-06-21 DIAGNOSIS — K432 Incisional hernia without obstruction or gangrene: Secondary | ICD-10-CM | POA: Diagnosis not present

## 2018-06-21 DIAGNOSIS — R4701 Aphasia: Secondary | ICD-10-CM | POA: Diagnosis present

## 2018-06-21 DIAGNOSIS — Z85828 Personal history of other malignant neoplasm of skin: Secondary | ICD-10-CM | POA: Insufficient documentation

## 2018-06-21 DIAGNOSIS — I10 Essential (primary) hypertension: Secondary | ICD-10-CM | POA: Diagnosis not present

## 2018-06-21 DIAGNOSIS — R0689 Other abnormalities of breathing: Secondary | ICD-10-CM | POA: Diagnosis not present

## 2018-06-21 MED ORDER — ONDANSETRON 4 MG PO TBDP
4.0000 mg | ORAL_TABLET | Freq: Once | ORAL | Status: AC
  Administered 2018-06-22: 4 mg via ORAL
  Filled 2018-06-21: qty 1

## 2018-06-21 MED ORDER — NITROGLYCERIN 0.4 MG SL SUBL
0.4000 mg | SUBLINGUAL_TABLET | Freq: Once | SUBLINGUAL | Status: AC
Start: 2018-06-21 — End: 2018-06-22
  Administered 2018-06-22: 0.4 mg via SUBLINGUAL
  Filled 2018-06-21: qty 1

## 2018-06-21 MED ORDER — GI COCKTAIL ~~LOC~~
30.0000 mL | Freq: Once | ORAL | Status: AC
  Administered 2018-06-22: 30 mL via ORAL
  Filled 2018-06-21: qty 30

## 2018-06-21 MED ORDER — GLUCAGON HCL RDNA (DIAGNOSTIC) 1 MG IJ SOLR
1.0000 mg | Freq: Once | INTRAMUSCULAR | Status: AC
  Administered 2018-06-22: 1 mg via INTRAMUSCULAR
  Filled 2018-06-21: qty 1

## 2018-06-21 NOTE — ED Notes (Signed)
Bed: RESA Expected date:  Expected time:  Means of arrival:  Comments: 17M choking on a pill

## 2018-06-21 NOTE — ED Triage Notes (Signed)
Pt states he took a colace pill and now feels like it is lodged in his esophagus. Patient took the pill x2 hours PTA. Patient is NAD, breathing normally, 97% RA

## 2018-06-21 NOTE — Progress Notes (Signed)
Subjective:    Patient ID: Jack Noble, male    DOB: 30-Nov-1928, 84 y.o.   MRN: [id number]  HPI 84 year old male who  has a past medical history of Allergy, Aphasia (12/26/2013), Cataracts, bilateral, Corneal abrasion, Dermatophytosis of nail, Diverticulosis, Epistaxis, Glaucoma, Glaucoma, Hearing loss, History of GI diverticular bleed (710/15), Hyperlipidemia, Hyperplasia of prostate without urinary obstruction, Hypertension, Rosacea, Sepsis due to Escherichia coli (E. coli) ([place]) (05/2011), and TIA (transient ischemic attack) (9/6-05/2012).  He presents to the office today for follow up after being seen in the ER 5 days ago for abdominal pain. In the ER his large incisional hernia was not easily reduced. CT scan on abdomen was done which was concerning for small bowel obstruction caused by large right inguinal hernia. The PA was able to reduce the hernia with gentle pressure and patient felt much better. Latate and WBC was normal. There was not concern for ischemic bowel at this time. He was able to eat and drink without difficulty and was discharged home. He was to follow up with General Surgery   He was discharged from the ER and advised to follow up with General Surgery.    Review of Systems See HPI   Past Medical History:  Diagnosis Date  . Allergy   . Aphasia 12/26/2013   Dr Jannifer Franklin  . Cataracts, bilateral   . Corneal abrasion    Dr. Ned Clines, [facility]  . Dermatophytosis of nail   . Diverticulosis   . Epistaxis   . Glaucoma    Dr. Edilia Bo  . Glaucoma   . Hearing loss   . History of GI diverticular bleed 710/15  . Hyperlipidemia   . Hyperplasia of prostate without urinary obstruction   . Hypertension   . Rosacea    Dr. Marge Duncans  . Sepsis due to Escherichia coli (E. coli) ([place]) 05/2011   Morganella also cultured  . TIA (transient ischemic attack) 9/6-05/2012   BP 221/99    Social History   Socioeconomic History  . Marital status: Married    Spouse name: Not on file    . Number of children: 1  . Years of education: college  . Highest education level: Not on file  Occupational History  . Occupation: [employer]    Employer: RETIRED  Social Needs  . Financial resource strain: Not on file  . Food insecurity:    Worry: Not on file    Inability: Not on file  . Transportation needs:    Medical: Not on file    Non-medical: Not on file  Tobacco Use  . Smoking status: Never Smoker  . Smokeless tobacco: Never Used  Substance and Sexual Activity  . Alcohol use: No  . Drug use: No  . Sexual activity: Not on file  Lifestyle  . Physical activity:    Days per week: Not on file    Minutes per session: Not on file  . Stress: Not on file  Relationships  . Social connections:    Talks on phone: Not on file    Gets together: Not on file    Attends religious service: Not on file    Active member of club or organization: Not on file    Attends meetings of clubs or organizations: Not on file    Relationship status: Not on file  . Intimate partner violence:    Fear of current or ex partner: Not on file    Emotionally abused: Not on file  Physically abused: Not on file    Forced sexual activity: Not on file  Other Topics Concern  . Not on file  Social History Narrative   Lives with his wife.     Was in the [employer] for 15 years   Left handed    Past Surgical History:  Procedure Laterality Date  . APPENDECTOMY     age 26  . CATARACT EXTRACTION, BILATERAL  2003   Dr Ishmael Holter (now seen @ [facility])  . CHOLECYSTECTOMY  1978   stones  . COLONOSCOPY W/ POLYPECTOMY  06/2009   Dr.Jacobs; "miniscule polyp"  . HERNIA REPAIR    . INGUINAL HERNIA REPAIR     age 65  . SKIN BIOPSY  12/2014   done by dermatologist   . TONSILLECTOMY AND ADENOIDECTOMY    . UPPER GI ENDOSCOPY  05/2014   negative    Family History  Problem Relation Age of Onset  . Stroke Mother 58  . Parkinsonism Brother        Brother died at age at age 64  . Pulmonary  embolism Father 90       Post Op  . Diabetes Neg Hx   . Cancer Neg Hx   . COPD Neg Hx   . Heart disease Neg Hx     Allergies  Allergen Reactions  . Amoxicillin     urticaria  . Codeine     Rash Because of a history of documented adverse serious drug reaction;Medi Alert bracelet  is recommended  . Minocycline Hcl     REACTION: nervous/chest pressure (Note: may have affected GE valve)  . Astelin [Azelastine Hcl]     Makes me jittery  . Azelastine Other (See Comments)  . Ether     pneumonia  . Zocor [Simvastatin] Other (See Comments)    Itching w/o, muscle problems and it affected his stomach  . Metoprolol     lightheaded    Current Outpatient Medications on File Prior to Visit  Medication Sig Dispense Refill  . aspirin EC 81 MG tablet Take 81 mg by mouth daily.     . Cholecalciferol (VITAMIN D) 1000 UNITS capsule Take 1,000 Units by mouth every morning.     . docusate sodium (COLACE) 100 MG capsule Take 100 mg by mouth 2 (two) times daily.    Water engineer Bandages & Supports (ABDOMINAL BINDER/ELASTIC LARGE) MISC Large or appropriate size Large abdominal hernia 1 each 0  . finasteride (PROSCAR) 5 MG tablet TAKE 1 TABLET BY MOUTH DAILY 90 tablet 3  . losartan (COZAAR) 50 MG tablet TAKE 1 TABLET BY MOUTH EVERY DAY 90 tablet 3  . memantine (NAMENDA) 5 MG tablet Take 1 tablet daily for one week, then take 1 tablet twice daily for one week, then take 1 tablet in the morning and 2 in the evening for one week, then take 2 tablets twice daily 70 tablet 0  . metroNIDAZOLE (METROCREAM) 0.75 % cream Apply 1 application topically as needed. Apply to face prn    . nystatin cream (MYCOSTATIN) APPLY TO AFFECTED AREA TWICE A DAY 30 g 0  . simethicone (MYLICON) 80 MG chewable tablet Chew 1 tablet (80 mg total) by mouth every 6 (six) hours as needed for flatulence. 30 tablet 0  . timolol (TIMOPTIC) 0.5 % ophthalmic solution Place 1 drop into both eyes 2 (two) times daily.     Marland Kitchen triamcinolone cream  (KENALOG) 0.1 % APPLY TO AFFECTED AREA TWICE A DAY 30 g  0   No current facility-administered medications on file prior to visit.     BP 132/70   Temp 97.8 F (36.6 C) (Oral)   Wt 141 lb (64 kg)   BMI 24.20 kg/m       Objective:   Physical Exam  Constitutional: He appears well-developed and well-nourished. No distress.  Cardiovascular: Normal rate and regular rhythm.  Pulmonary/Chest: Effort normal and breath sounds normal.  Abdominal: Soft. Bowel sounds are normal. There is no tenderness. A hernia (large inguinal hernia and right incision hernia ) is present.  Neurological: He is alert.  Aphasic   Skin: He is not diaphoretic.  Nursing note and vitals reviewed.     Assessment & Plan:  1. Incisional hernia, without obstruction or gangrene - even though he is a poor surgical candidate. I do think it would be a good idea to see a general surgeon about his hernia since it caused a bowel obstruction  - Ambulatory referral to [place], NP

## 2018-06-22 ENCOUNTER — Telehealth: Payer: Self-pay | Admitting: Adult Health

## 2018-06-22 DIAGNOSIS — K208 Other esophagitis: Secondary | ICD-10-CM | POA: Diagnosis not present

## 2018-06-22 MED ORDER — SUCRALFATE 1 GM/10ML PO SUSP: 1.0000 g | Freq: Three times a day (TID) | ORAL | 0 refills | Status: AC

## 2018-06-22 NOTE — ED Provider Notes (Addendum)
[facility] DEPT Provider Note   CSN: [id number] Arrival date & time: 06/21/18  2247     History   Chief Complaint Chief Complaint  Patient presents with  . Pill Stuck in Throat    HPI Jack Noble is a 84 y.o. male.  HPI 84 year old male with history of a aphasia comes in with chief complaint of foreign body stuck in the throat.  Patient states that he took a Colace feel after dinner and since then he has been feeling that something is stuck in his throat.  Patient has not had similar symptoms in the past.  Patient has been able to keep applesauce down, but it was difficult and painful.  Patient denies any regurgitation or vomiting, although he has felt nauseated.  There is no change in voice according to family.  Patient denies any chest pain, shortness of breath.  Past Medical History:  Diagnosis Date  . Allergy   . Aphasia 12/26/2013   Dr Jannifer Franklin  . Cataracts, bilateral   . Corneal abrasion    Dr. Ned Clines, [facility]  . Dermatophytosis of nail   . Diverticulosis   . Epistaxis   . Glaucoma    Dr. Edilia Bo  . Glaucoma   . Hearing loss   . History of GI diverticular bleed 710/15  . Hyperlipidemia   . Hyperplasia of prostate without urinary obstruction   . Hypertension   . Rosacea    Dr. Marge Duncans  . Sepsis due to Escherichia coli (E. coli) ([place]) 05/2011   Morganella also cultured  . TIA (transient ischemic attack) 9/6-05/2012   BP 221/99    Patient Active Problem List   Diagnosis Date Noted  . Hernia of abdominal cavity 11/07/2017  . Constipation 11/09/2016  . Transient confusion 09/24/2016  . Ventral hernia without obstruction or gangrene 08/10/2016  . Bilateral inguinal hernia 04/06/2016  . External hemorrhoid 02/25/2016  . Confusion 02/25/2016  . Poor balance 02/05/2016  . Right knee pain 02/05/2016  . Essential hypertension, benign 12/26/2015  . Fatigue 09/19/2015  . GERD (gastroesophageal reflux disease) 06/06/2015  .  Skin cancer 09/14/2014  . History of GI diverticular bleed 06/08/2014  . Aphasia 12/26/2013  . History of short term memory loss 09/01/2013  . Hypoalbuminemia 08/04/2013  . Severe sepsis ([place]) 07/24/2013  . BPH (benign prostatic hyperplasia) 07/24/2013  . Glaucoma   . DIVERTICULOSIS, COLON 07/17/2009  . COLONIC POLYPS, HX OF 07/17/2009  . ALLERGIC RHINITIS 10/16/2008  . LIPOMAS, MULTIPLE 11/04/2007  . Rosacea 05/12/2007    Past Surgical History:  Procedure Laterality Date  . APPENDECTOMY     age 89  . CATARACT EXTRACTION, BILATERAL  2003   Dr Ishmael Holter (now seen @ [place])  . CHOLECYSTECTOMY  1978   stones  . COLONOSCOPY W/ POLYPECTOMY  06/2009   Dr.Jacobs; "miniscule polyp"  . HERNIA REPAIR    . INGUINAL HERNIA REPAIR     age 35  . SKIN BIOPSY  12/2014   done by dermatologist   . TONSILLECTOMY AND ADENOIDECTOMY    . UPPER GI ENDOSCOPY  05/2014   negative        Home Medications    Prior to Admission medications   Medication Sig Start Date End Date Taking? Authorizing Provider  aspirin EC 81 MG tablet Take 81 mg by mouth daily.     [provider]  Cholecalciferol (VITAMIN D) 1000 UNITS capsule Take 1,000 Units by mouth every morning.     [provider]  docusate sodium (COLACE) 100 MG capsule Take 100 mg by mouth 2 (two) times daily.    [provider]  Elastic Bandages & Supports (ABDOMINAL BINDER/ELASTIC LARGE) MISC Large or appropriate size Large abdominal hernia 08/10/16   Binnie Rail, MD  finasteride (PROSCAR) 5 MG tablet TAKE 1 TABLET BY MOUTH DAILY 03/29/18   Nafziger, Tommi Rumps, NP  losartan (COZAAR) 50 MG tablet TAKE 1 TABLET BY MOUTH EVERY DAY 12/28/17   Nafziger, Tommi Rumps, NP  memantine (NAMENDA) 5 MG tablet Take 1 tablet daily for one week, then take 1 tablet twice daily for one week, then take 1 tablet in the morning and 2 in the evening for one week, then take 2 tablets twice daily 06/16/18   Kathrynn Ducking, MD  metroNIDAZOLE  (METROCREAM) 0.75 % cream Apply 1 application topically as needed. Apply to face prn 03/01/18   [provider]  nystatin cream (MYCOSTATIN) APPLY TO AFFECTED AREA TWICE A DAY 01/10/18   Nafziger, Tommi Rumps, NP  simethicone (MYLICON) 80 MG chewable tablet Chew 1 tablet (80 mg total) by mouth every 6 (six) hours as needed for flatulence. 06/03/14   Kinnie Feil, MD  sucralfate (CARAFATE) 1 GM/10ML suspension Take 10 mLs (1 g total) by mouth 4 (four) times daily -  with meals and at bedtime. 06/22/18   Varney Biles, MD  timolol (TIMOPTIC) 0.5 % ophthalmic solution Place 1 drop into both eyes 2 (two) times daily.  01/10/18   [provider]  triamcinolone cream (KENALOG) 0.1 % APPLY TO AFFECTED AREA TWICE A DAY 01/10/18   Nafziger, Tommi Rumps, NP    Family History Family History  Problem Relation Age of Onset  . Stroke Mother 60  . Parkinsonism Brother        Brother died at age at age 64  . Pulmonary embolism Father 53       Post Op  . Diabetes Neg Hx   . Cancer Neg Hx   . COPD Neg Hx   . Heart disease Neg Hx     Social History Social History   Tobacco Use  . Smoking status: Never Smoker  . Smokeless tobacco: Never Used  Substance Use Topics  . Alcohol use: No  . Drug use: No     Allergies   Amoxicillin; Codeine; Minocycline hcl; Astelin [azelastine hcl]; Azelastine; Ether; Zocor [simvastatin]; and Metoprolol   Review of Systems Review of Systems  Constitutional: Positive for activity change.  HENT: Positive for trouble swallowing. Negative for voice change.   Respiratory: Negative for shortness of breath.   Cardiovascular: Negative for chest pain.  Allergic/Immunologic: Negative for immunocompromised state.  Hematological: Does not bruise/bleed easily.     Physical Exam Updated Vital Signs BP (!) 168/70   Pulse 73   Temp 98.1 F (36.7 C) (Oral)   Resp 16   Ht 5\' 7"  (1.702 m)   Wt 64 kg (141 lb)   SpO2 100%   BMI 22.08 kg/m   Physical Exam    Constitutional: He is oriented to person, place, and time. He appears well-developed.  HENT:  Head: Atraumatic.  Neck: Neck supple. No tracheal deviation present. No thyromegaly present.  Cardiovascular: Normal rate.  Pulmonary/Chest: Effort normal. No stridor.  Lymphadenopathy:    He has no cervical adenopathy.  Neurological: He is alert and oriented to person, place, and time.  Skin: Skin is warm.  Nursing note and vitals reviewed.    ED Treatments / Results  Labs (all labs  ordered are listed, but only abnormal results are displayed) Labs Reviewed - No data to display  EKG EKG Interpretation  Date/Time:  Wednesday June 22 2018 01:59:42 EDT Ventricular Rate:  52 PR Interval:    QRS Duration: 105 QT Interval:  442 QTC Calculation: 411 R Axis:   42 Text Interpretation:  Sinus rhythm Abnormal R-wave progression, early transition No acute changes Nonspecific ST and T wave abnormality Confirmed by Varney Biles [phone]) on 06/22/2018 2:03:49 AM   Radiology Dg Chest 2 View  Result Date: 06/22/2018 CLINICAL DATA:  Foreign body sensation in mid esophagus (pill). EXAM: CHEST - 2 VIEW COMPARISON:  Chest radiograph 06/01/2014 FINDINGS: Lung volumes are low. The cardiomediastinal contours are normal. Aortic atherosclerosis. Pulmonary vasculature is normal. No consolidation, pleural effusion, or pneumothorax. No acute osseous abnormalities are seen. IMPRESSION: Low lung volumes without acute chest finding. No radiopaque foreign body. Electronically Signed   By: Jeb Levering M.D.   On: 06/22/2018 00:13    Procedures Procedures (including critical care time)  Medications Ordered in ED Medications  gi cocktail (Maalox,Lidocaine,Donnatal) (30 mLs Oral Given 06/22/18 0029)  nitroGLYCERIN (NITROSTAT) SL tablet 0.4 mg (0.4 mg Sublingual Given 06/22/18 0030)  glucagon (human recombinant) (GLUCAGEN) injection 1 mg (1 mg Intramuscular Given 06/22/18 0033)  ondansetron (ZOFRAN-ODT)  disintegrating tablet 4 mg (4 mg Oral Given 06/22/18 0029)     Initial Impression / Assessment and Plan / ED Course  I have reviewed the triage vital signs and the nursing notes.  Pertinent labs & imaging results that were available during my care of the patient were reviewed by me and considered in my medical decision making (see chart for details).  Clinical Course as of Jun 23 203  Wed Jun 22, 2018  0142 Pt reassessed. Pt's VSS and WNL. Pt's cap refill < 3 seconds. Pt has been hydrated in the ER and now passed po challenge. We will discharge with antiemetic. Strict ER return precautions have been discussed and pt will return if he is unable to tolerate fluids and symptoms are getting worse.    [AN]  0145 Clear liquid diet has been recommended for at least one day, family is okay with the plan.   [AN]    Clinical Course User Index [AN] Varney Biles, MD    84 year old male with history of TIA comes in with chief complaint of difficulty in swallowing.  He had swallowed some pills, few minutes later he started having discomfort with swallowing.  He eventually led his wife know, who decided to bring him to the ER.  Patient is not having any stridor, there is no bruits on eye exam.  On the respiratory front, patient has no hypoxia.  We will give him IM glucagon, GI cocktail and nitroglycerin and reassess.  It is possible that he might have had pill esophagitis.  EKG will be ordered to ensure there is no ACS.  Patient has no chest pain, shortness of breath.  Final Clinical Impressions(s) / ED Diagnoses   Final diagnoses:  Pill esophagitis    ED Discharge Orders        Ordered    sucralfate (CARAFATE) 1 GM/10ML suspension  3 times daily with meals & bedtime     06/22/18 0143       Varney Biles, MD 06/22/18 0145    Varney Biles, MD 06/22/18 [phone]

## 2018-06-22 NOTE — Telephone Encounter (Signed)
He can restart his medications today. Ok to advance diet as tolerated. Might be a good idea to take pills with applesauce

## 2018-06-22 NOTE — Discharge Instructions (Addendum)
We saw in the ER for difficulty in swallowing. X-rays do not show any foreign body. You have been successful in keeping water down in the ER. We suspect that you had pill esophagitis -we recommend that you follow-up with your doctor in 1 week. Call the GI doctors if your symptoms continue. Return to the ER if you start having worsening in your symptoms -specifically if you are unable to swallow, start choking or have difficulty in breathing.

## 2018-06-22 NOTE — Telephone Encounter (Signed)
Spoke to Mrs. Jack Noble and informed her to resume medication today.  Advance diet as tolerated and can try taking medication with applesauce.  She agreed to plan.  Will call back if needed.  No further action required.

## 2018-06-22 NOTE — Telephone Encounter (Signed)
Copied from [place] [phone]. Topic: Quick Communication - See Telephone Encounter >> Jun 22, 2018  3:18 PM Bea Graff, NT wrote: CRM for notification. See Telephone encounter for: 06/22/18. Pt was seen in the Er for a pill getting stuck in his throat. Er instructed for today to eat a soft diet. Wife is wanting to know when he can start back taking his pills and increase his diet? Please advise.

## 2018-06-22 NOTE — ED Notes (Signed)
Patient able to tolerate water without issue. States it does not hurt to swallow. EDP at bedside.

## 2018-06-27 ENCOUNTER — Ambulatory Visit (INDEPENDENT_AMBULATORY_CARE_PROVIDER_SITE_OTHER): Payer: MEDICARE | Admitting: Family Medicine

## 2018-06-27 VITALS — BP 130/64 | HR 65 | Temp 98.2°F | Wt 139.4 lb

## 2018-06-27 DIAGNOSIS — K5900 Constipation, unspecified: Secondary | ICD-10-CM | POA: Diagnosis not present

## 2018-06-27 NOTE — Patient Instructions (Signed)
Constipation, Adult Constipation is when a person has fewer bowel movements in a week than normal, has difficulty having a bowel movement, or has stools that are dry, hard, or larger than normal. Constipation may be caused by an underlying condition. It may become worse with age if a person takes certain medicines and does not take in enough fluids. Follow these instructions at home: Eating and drinking   Eat foods that have a lot of fiber, such as fresh fruits and vegetables, whole grains, and beans.  Limit foods that are high in fat, low in fiber, or overly processed, such as french fries, hamburgers, cookies, candies, and soda.  Drink enough fluid to keep your urine clear or pale yellow. General instructions  Exercise regularly or as told by your health care provider.  Go to the restroom when you have the urge to go. Do not hold it in.  Take over-the-counter and prescription medicines only as told by your health care provider. These include any fiber supplements.  Practice pelvic floor retraining exercises, such as deep breathing while relaxing the lower abdomen and pelvic floor relaxation during bowel movements.  Watch your condition for any changes.  Keep all follow-up visits as told by your health care provider. This is important. Contact a health care provider if:  You have pain that gets worse.  You have a fever.  You do not have a bowel movement after 4 days.  You vomit.  You are not hungry.  You lose weight.  You are bleeding from the anus.  You have thin, pencil-like stools. Get help right away if:  You have a fever and your symptoms suddenly get worse.  You leak stool or have blood in your stool.  Your abdomen is bloated.  You have severe pain in your abdomen.  You feel dizzy or you faint. This information is not intended to replace advice given to you by your health care provider. Make sure you discuss any questions you have with your health care  provider. Document Released: 08/07/2004 Document Revised: 05/29/2016 Document Reviewed: 04/29/2016 Elsevier Interactive Patient Education  2018 [employer].  Drink more fluids  Consider Senokot -S take two tablets at night  Miralax take once daily as needed.    Consider glycerin suppository.

## 2018-06-27 NOTE — Progress Notes (Signed)
Subjective:     Patient ID: Jack Noble, male   DOB: 1929-09-30, 84 y.o.   MRN: [id number]  HPI Patient seen with some constipation issues. Wife states he has not a bowel movement in 5-7 days. Patient has significant dementia and unable to provide reliable history. He was seen recently in the ER on July 25 with symptomatic right inguinal hernia which was reduced in the ER with symptom improvement. He received pain medication through IV at that time. He's not taking any opioids or other constipating medications at this time. Has not complained of abdominal pain. No nausea or vomiting. Eating fairly well.  Wife has tried some Colace without much improvement. She states in general he does not drink a lot of fluids  Past Medical History:  Diagnosis Date  . Allergy   . Aphasia 12/26/2013   Dr Jannifer Franklin  . Cataracts, bilateral   . Corneal abrasion    Dr. Ned Clines, [facility]  . Dermatophytosis of nail   . Diverticulosis   . Epistaxis   . Glaucoma    Dr. Edilia Bo  . Glaucoma   . Hearing loss   . History of GI diverticular bleed 710/15  . Hyperlipidemia   . Hyperplasia of prostate without urinary obstruction   . Hypertension   . Rosacea    Dr. Marge Duncans  . Sepsis due to Escherichia coli (E. coli) ([place]) 05/2011   Morganella also cultured  . TIA (transient ischemic attack) 9/6-05/2012   BP 221/99   Past Surgical History:  Procedure Laterality Date  . APPENDECTOMY     age 56  . CATARACT EXTRACTION, BILATERAL  2003   Dr Ishmael Holter (now seen @ [place])  . CHOLECYSTECTOMY  1978   stones  . COLONOSCOPY W/ POLYPECTOMY  06/2009   Dr.Jacobs; "miniscule polyp"  . HERNIA REPAIR    . INGUINAL HERNIA REPAIR     age 6  . SKIN BIOPSY  12/2014   done by dermatologist   . TONSILLECTOMY AND ADENOIDECTOMY    . UPPER GI ENDOSCOPY  05/2014   negative    reports that he has never smoked. He has never used smokeless tobacco. He reports that he does not drink alcohol or use drugs. family history  includes Parkinsonism in his brother; Pulmonary embolism (age of onset: 75) in his father; Stroke (age of onset: 56) in his mother. Allergies  Allergen Reactions  . Amoxicillin     urticaria  . Codeine     Rash Because of a history of documented adverse serious drug reaction;Medi Alert bracelet  is recommended  . Minocycline Hcl     REACTION: nervous/chest pressure (Note: may have affected GE valve)  . Astelin [Azelastine Hcl]     Makes me jittery  . Azelastine Other (See Comments)  . Ether     pneumonia  . Zocor [Simvastatin] Other (See Comments)    Itching w/o, muscle problems and it affected his stomach  . Metoprolol     lightheaded     Review of Systems  Respiratory: Negative for shortness of breath.   Cardiovascular: Negative for chest pain.  Gastrointestinal: Positive for constipation. Negative for abdominal pain, nausea and vomiting.       Objective:   Physical Exam  Constitutional: He appears well-developed and well-nourished.  Cardiovascular: Normal rate.  Pulmonary/Chest: Effort normal and breath sounds normal.  Abdominal:  Patient has normal bowel sounds. Has large abdominal wall hernia which is soft and nontender.  Rectal exam reveals some firm hard stool  in the rectal vault but no impaction.       Assessment:     Constipation. Wife relates 5-7 day history of no bowel movement. No impaction    Plan:     -Increase fluid intake -Consider MiraLAX 17 g once daily -Consider Senokot-S 2 tablets daily if no relief with the above -They will also consider glycerin suppository -Touch base if no relief in 2 days  Eulas Post MD [facility]

## 2018-07-14 ENCOUNTER — Emergency Department (HOSPITAL_COMMUNITY)
Admission: EM | Admit: 2018-07-14 | Discharge: 2018-07-14 | Disposition: A | Discharge status: Home / Self Care | Payer: MEDICARE | Attending: Emergency Medicine | Admitting: Emergency Medicine

## 2018-07-14 ENCOUNTER — Emergency Department (HOSPITAL_COMMUNITY): Payer: MEDICARE

## 2018-07-14 DIAGNOSIS — W1830XA Fall on same level, unspecified, initial encounter: Secondary | ICD-10-CM | POA: Diagnosis not present

## 2018-07-14 DIAGNOSIS — R4701 Aphasia: Secondary | ICD-10-CM | POA: Diagnosis not present

## 2018-07-14 DIAGNOSIS — M25531 Pain in right wrist: Secondary | ICD-10-CM | POA: Diagnosis not present

## 2018-07-14 DIAGNOSIS — M545 Low back pain: Secondary | ICD-10-CM | POA: Insufficient documentation

## 2018-07-14 DIAGNOSIS — Z043 Encounter for examination and observation following other accident: Secondary | ICD-10-CM | POA: Diagnosis present

## 2018-07-14 DIAGNOSIS — R4781 Slurred speech: Secondary | ICD-10-CM | POA: Diagnosis not present

## 2018-07-14 DIAGNOSIS — S79911A Unspecified injury of right hip, initial encounter: Secondary | ICD-10-CM | POA: Diagnosis not present

## 2018-07-14 DIAGNOSIS — R410 Disorientation, unspecified: Secondary | ICD-10-CM | POA: Diagnosis not present

## 2018-07-14 DIAGNOSIS — M25532 Pain in left wrist: Secondary | ICD-10-CM | POA: Diagnosis not present

## 2018-07-14 DIAGNOSIS — Z79899 Other long term (current) drug therapy: Secondary | ICD-10-CM | POA: Diagnosis not present

## 2018-07-14 DIAGNOSIS — F0390 Unspecified dementia without behavioral disturbance: Secondary | ICD-10-CM | POA: Diagnosis not present

## 2018-07-14 DIAGNOSIS — W19XXXA Unspecified fall, initial encounter: Secondary | ICD-10-CM

## 2018-07-14 DIAGNOSIS — R52 Pain, unspecified: Secondary | ICD-10-CM | POA: Diagnosis not present

## 2018-07-14 DIAGNOSIS — S3992XA Unspecified injury of lower back, initial encounter: Secondary | ICD-10-CM | POA: Diagnosis not present

## 2018-07-14 DIAGNOSIS — Z7982 Long term (current) use of aspirin: Secondary | ICD-10-CM | POA: Diagnosis not present

## 2018-07-14 DIAGNOSIS — I10 Essential (primary) hypertension: Secondary | ICD-10-CM | POA: Diagnosis not present

## 2018-07-14 DIAGNOSIS — S6992XA Unspecified injury of left wrist, hand and finger(s), initial encounter: Secondary | ICD-10-CM | POA: Diagnosis not present

## 2018-07-14 DIAGNOSIS — M549 Dorsalgia, unspecified: Secondary | ICD-10-CM | POA: Diagnosis not present

## 2018-07-14 NOTE — ED Triage Notes (Signed)
Pt BIB [facility]. PT has hx of expressive, receptive aphasia. Currently at baseline. Witnessed fall by wife, no head inj, no LOC, tripped and fell backwards. No blood thinners. C/o pain in back and on palpation. No signs of trauma. Baseline pt is ambulatory.

## 2018-07-14 NOTE — Discharge Instructions (Addendum)
Take Tylenol for pain and follow-up with your doctor if any problems °

## 2018-07-14 NOTE — ED Provider Notes (Signed)
[facility] DEPT Provider Note   CSN: [id number] Arrival date & time: 07/14/18  [place]     History   Chief Complaint Chief Complaint  Patient presents with  . Fall    HPI Jack Noble is a 84 y.o. male.  Patient fell today.  His wife said that he got his arm and leg caught in a chair and she gently laid him down to get them out.  Patient did not hit his head.  The history is provided by the patient and a relative. No language interpreter was used.  Fall  This is a new problem. The current episode started less than 1 hour ago. The problem occurs every several days. The problem has been resolved. Pertinent negatives include no chest pain. Nothing aggravates the symptoms. Nothing relieves the symptoms. He has tried nothing for the symptoms. The treatment provided no relief.    Past Medical History:  Diagnosis Date  . Allergy   . Aphasia 12/26/2013   Dr Jannifer Franklin  . Cataracts, bilateral   . Corneal abrasion    Dr. Ned Clines, [facility]  . Dermatophytosis of nail   . Diverticulosis   . Epistaxis   . Glaucoma    Dr. Edilia Bo  . Glaucoma   . Hearing loss   . History of GI diverticular bleed 710/15  . Hyperlipidemia   . Hyperplasia of prostate without urinary obstruction   . Hypertension   . Rosacea    Dr. Marge Duncans  . Sepsis due to Escherichia coli (E. coli) ([place]) 05/2011   Morganella also cultured  . TIA (transient ischemic attack) 9/6-05/2012   BP 221/99    Patient Active Problem List   Diagnosis Date Noted  . Hernia of abdominal cavity 11/07/2017  . Constipation 11/09/2016  . Transient confusion 09/24/2016  . Ventral hernia without obstruction or gangrene 08/10/2016  . Bilateral inguinal hernia 04/06/2016  . External hemorrhoid 02/25/2016  . Confusion 02/25/2016  . Poor balance 02/05/2016  . Right knee pain 02/05/2016  . Essential hypertension, benign 12/26/2015  . Fatigue 09/19/2015  . GERD (gastroesophageal reflux disease) 06/06/2015   . Skin cancer 09/14/2014  . History of GI diverticular bleed 06/08/2014  . Aphasia 12/26/2013  . History of short term memory loss 09/01/2013  . Hypoalbuminemia 08/04/2013  . Severe sepsis ([place]) 07/24/2013  . BPH (benign prostatic hyperplasia) 07/24/2013  . Glaucoma   . DIVERTICULOSIS, COLON 07/17/2009  . COLONIC POLYPS, HX OF 07/17/2009  . ALLERGIC RHINITIS 10/16/2008  . LIPOMAS, MULTIPLE 11/04/2007  . Rosacea 05/12/2007    Past Surgical History:  Procedure Laterality Date  . APPENDECTOMY     age 81  . CATARACT EXTRACTION, BILATERAL  2003   Dr Ishmael Holter (now seen @ [facility])  . CHOLECYSTECTOMY  1978   stones  . COLONOSCOPY W/ POLYPECTOMY  06/2009   Dr.Jacobs; "miniscule polyp"  . HERNIA REPAIR    . INGUINAL HERNIA REPAIR     age 77  . SKIN BIOPSY  12/2014   done by dermatologist   . TONSILLECTOMY AND ADENOIDECTOMY    . UPPER GI ENDOSCOPY  05/2014   negative        Home Medications    Prior to Admission medications   Medication Sig Start Date End Date Taking? Authorizing Provider  aspirin EC 81 MG tablet Take 81 mg by mouth daily.    Yes [provider]  Cholecalciferol (VITAMIN D) 1000 UNITS capsule Take 1,000 Units by mouth every morning.  Yes [provider]  finasteride (PROSCAR) 5 MG tablet TAKE 1 TABLET BY MOUTH DAILY 03/29/18  Yes Nafziger, Tommi Rumps, NP  hydrocortisone cream 0.5 % Apply 1 application topically daily as needed for itching.   Yes [provider]  losartan (COZAAR) 50 MG tablet TAKE 1 TABLET BY MOUTH EVERY DAY 12/28/17  Yes Nafziger, Tommi Rumps, NP  polyethylene glycol (MIRALAX / GLYCOLAX) packet Take 17 g by mouth daily as needed for moderate constipation.   Yes [provider]  simethicone (MYLICON) 80 MG chewable tablet Chew 1 tablet (80 mg total) by mouth every 6 (six) hours as needed for flatulence. 06/03/14  Yes Buriev, Arie Sabina, MD  sucralfate (CARAFATE) 1 GM/10ML suspension Take 10 mLs (1 g total) by mouth 4 (four)  times daily -  with meals and at bedtime. 06/22/18  Yes Nanavati, Ankit, MD  timolol (TIMOPTIC) 0.5 % ophthalmic solution Place 1 drop into both eyes 2 (two) times daily.  01/10/18  Yes [provider]  Elastic Bandages & Supports (ABDOMINAL BINDER/ELASTIC LARGE) MISC Large or appropriate size Large abdominal hernia 08/10/16   Binnie Rail, MD  memantine (NAMENDA) 5 MG tablet Take 1 tablet daily for one week, then take 1 tablet twice daily for one week, then take 1 tablet in the morning and 2 in the evening for one week, then take 2 tablets twice daily Patient not taking: Reported on 07/14/2018 06/16/18   Kathrynn Ducking, MD  nystatin cream (MYCOSTATIN) APPLY TO AFFECTED AREA TWICE A DAY Patient taking differently: Apply 1 application topically 2 (two) times daily as needed for dry skin (rash).  01/10/18   Nafziger, Tommi Rumps, NP  triamcinolone cream (KENALOG) 0.1 % APPLY TO AFFECTED AREA TWICE A DAY Patient taking differently: Apply 1 application topically 2 (two) times daily as needed (dry skin).  01/10/18   [name], NP    Family History Family History  Problem Relation Age of Onset  . Stroke Mother 47  . Parkinsonism Brother        Brother died at age at age 47  . Pulmonary embolism Father 60       Post Op  . Diabetes Neg Hx   . Cancer Neg Hx   . COPD Neg Hx   . Heart disease Neg Hx     Social History Social History   Tobacco Use  . Smoking status: Never Smoker  . Smokeless tobacco: Never Used  Substance Use Topics  . Alcohol use: No  . Drug use: No     Allergies   Amoxicillin; Codeine; Minocycline hcl; Astelin [azelastine hcl]; Azelastine; Ether; Zocor [simvastatin]; and Metoprolol   Review of Systems Review of Systems  Unable to perform ROS: Dementia  Cardiovascular: Negative for chest pain.     Physical Exam Updated Vital Signs BP (!) 142/68   Pulse 64   Temp 97.6 F (36.4 C) (Oral)   Resp (!) 24   SpO2 100%   Physical Exam  Constitutional: He  is oriented to person, place, and time. He appears well-developed.  HENT:  Head: Normocephalic.  Eyes: Conjunctivae and EOM are normal. No scleral icterus.  Neck: Neck supple. No thyromegaly present.  Cardiovascular: Normal rate and regular rhythm. Exam reveals no gallop and no friction rub.  No murmur heard. Pulmonary/Chest: No stridor. He has no wheezes. He has no rales. He exhibits no tenderness.  Abdominal: He exhibits no distension. There is no tenderness. There is no rebound.  Musculoskeletal: Normal range of motion. He  exhibits no edema.  Minimal tenderness to left wrist with full range of motion.  Also minimal tenderness lumbar spine  Lymphadenopathy:    He has no cervical adenopathy.  Neurological: He is oriented to person, place, and time. He exhibits normal muscle tone. Coordination normal.  Skin: No rash noted. No erythema.  Psychiatric: He has a normal mood and affect. His behavior is normal.     ED Treatments / Results  Labs (all labs ordered are listed, but only abnormal results are displayed) Labs Reviewed - No data to display  EKG None  Radiology Dg Lumbar Spine Complete  Result Date: 07/14/2018 CLINICAL DATA:  Fall with back pain EXAM: LUMBAR SPINE - COMPLETE 4+ VIEW COMPARISON:  CT 06/16/2018 FINDINGS: Lumbar alignment within normal limits. Vertebral body heights are maintained. Lumbarized S1 segment. Mild degenerative changes at L3-L4 and L4-L5. IMPRESSION: Mild degenerative changes.  No acute osseous abnormality. Electronically Signed   By: Donavan Foil M.D.   On: 07/14/2018 19:48   Dg Wrist Complete Left  Result Date: 07/14/2018 CLINICAL DATA:  Fall EXAM: LEFT WRIST - COMPLETE 3+ VIEW COMPARISON:  None. FINDINGS: No fracture or malalignment. Moderate-to-marked arthritis at the first [facility] joint. IMPRESSION: No acute osseous abnormality.  Arthritis at the first [facility] joint Electronically Signed   By: Donavan Foil M.D.   On: 07/14/2018 19:45   Dg Hip Unilat W Or  Wo Pelvis 2-3 Views Right  Result Date: 07/14/2018 CLINICAL DATA:  Fall with back pain EXAM: DG HIP (WITH OR WITHOUT PELVIS) 2-3V RIGHT COMPARISON:  04/07/2018 FINDINGS: SI joints are non widened. Pubic symphysis and rami are intact. No fracture or dislocation. Joint space is maintained. Vascular calcifications. IMPRESSION: No acute osseous abnormality Electronically Signed   By: Donavan Foil M.D.   On: 07/14/2018 19:44    Procedures Procedures (including critical care time)  Medications Ordered in ED Medications - No data to display   Initial Impression / Assessment and Plan / ED Course  I have reviewed the triage vital signs and the nursing notes.  Pertinent labs & imaging results that were available during my care of the patient were reviewed by me and considered in my medical decision making (see chart for details).     X-rays show no fractures or dislocations.  Patient with fall and no significant injury he will follow-up with PCP  Final Clinical Impressions(s) / ED Diagnoses   Final diagnoses:  Fall, initial encounter    ED Discharge Orders    None       Milton Ferguson, MD 07/14/18 2145

## 2018-07-14 NOTE — ED Notes (Signed)
Unable to assess pt fully due to cognitive changes due to a stroke. Pt palpated and did not appear to have any pain. No signs of trauma on pt.

## 2018-07-15 ENCOUNTER — Ambulatory Visit (INDEPENDENT_AMBULATORY_CARE_PROVIDER_SITE_OTHER): Payer: MEDICARE | Admitting: Adult Health

## 2018-07-15 ENCOUNTER — Encounter: Payer: Self-pay | Admitting: Adult Health

## 2018-07-15 VITALS — BP 128/68 | HR 62 | Temp 97.8°F | Wt 139.0 lb

## 2018-07-15 DIAGNOSIS — R269 Unspecified abnormalities of gait and mobility: Secondary | ICD-10-CM

## 2018-07-15 NOTE — Progress Notes (Signed)
Subjective:    Patient ID: Jack Noble, male    DOB: Jun 19, 1929, 84 y.o.   MRN: [id number]  HPI  84 year old male who  has a past medical history of Allergy, Aphasia (12/26/2013), Cataracts, bilateral, Corneal abrasion, Dermatophytosis of nail, Diverticulosis, Epistaxis, Glaucoma, Glaucoma, Hearing loss, History of GI diverticular bleed (710/15), Hyperlipidemia, Hyperplasia of prostate without urinary obstruction, Hypertension, Rosacea, Sepsis due to Escherichia coli (E. coli) ([place]) (05/2011), and TIA (transient ischemic attack) (9/6-05/2012).  He presents to the office today after being seen in the ER last night s/p fall. His wife reports that she had to call 911 after he lost his balance pulling out the kitchen chair. Some how his left arm became entangled between the slats of the back of the chair and EMS had to help him get his arm free. He was taken to the ER, xrays of lumbar spine, left wrist, and right hip were negative. His wife reports that he did not hit is head.   He has no complaint of pain today.   Review of Systems  Unable to perform ROS: Dementia   See HPI   Past Medical History:  Diagnosis Date  . Allergy   . Aphasia 12/26/2013   Dr Jannifer Franklin  . Cataracts, bilateral   . Corneal abrasion    Dr. Ned Clines, [facility]  . Dermatophytosis of nail   . Diverticulosis   . Epistaxis   . Glaucoma    Dr. Edilia Bo  . Glaucoma   . Hearing loss   . History of GI diverticular bleed 710/15  . Hyperlipidemia   . Hyperplasia of prostate without urinary obstruction   . Hypertension   . Rosacea    Dr. Marge Duncans  . Sepsis due to Escherichia coli (E. coli) ([place]) 05/2011   Morganella also cultured  . TIA (transient ischemic attack) 9/6-05/2012   BP 221/99    Social History   Socioeconomic History  . Marital status: Married    Spouse name: Not on file  . Number of children: 1  . Years of education: college  . Highest education level: Not on file  Occupational History  .  Occupation: [employer]    Employer: RETIRED  Social Needs  . Financial resource strain: Not on file  . Food insecurity:    Worry: Not on file    Inability: Not on file  . Transportation needs:    Medical: Not on file    Non-medical: Not on file  Tobacco Use  . Smoking status: Never Smoker  . Smokeless tobacco: Never Used  Substance and Sexual Activity  . Alcohol use: No  . Drug use: No  . Sexual activity: Not on file  Lifestyle  . Physical activity:    Days per week: Not on file    Minutes per session: Not on file  . Stress: Not on file  Relationships  . Social connections:    Talks on phone: Not on file    Gets together: Not on file    Attends religious service: Not on file    Active member of club or organization: Not on file    Attends meetings of clubs or organizations: Not on file    Relationship status: Not on file  . Intimate partner violence:    Fear of current or ex partner: Not on file    Emotionally abused: Not on file    Physically abused: Not on file    Forced sexual activity: Not on file  Other Topics Concern  . Not on file  Social History Narrative   Lives with his wife.     Was in the [employer] for 15 years   Left handed    Past Surgical History:  Procedure Laterality Date  . APPENDECTOMY     age 89  . CATARACT EXTRACTION, BILATERAL  2003   Dr Ishmael Holter (now seen @ [facility])  . CHOLECYSTECTOMY  1978   stones  . COLONOSCOPY W/ POLYPECTOMY  06/2009   Dr.Jacobs; "miniscule polyp"  . HERNIA REPAIR    . INGUINAL HERNIA REPAIR     age 62  . SKIN BIOPSY  12/2014   done by dermatologist   . TONSILLECTOMY AND ADENOIDECTOMY    . UPPER GI ENDOSCOPY  05/2014   negative    Family History  Problem Relation Age of Onset  . Stroke Mother 26  . Parkinsonism Brother        Brother died at age at age 23  . Pulmonary embolism Father 19       Post Op  . Diabetes Neg Hx   . Cancer Neg Hx   . COPD Neg Hx   . Heart disease Neg Hx     Allergies   Allergen Reactions  . Amoxicillin     urticaria  . Codeine     Rash Because of a history of documented adverse serious drug reaction;Medi Alert bracelet  is recommended  . Minocycline Hcl     REACTION: nervous/chest pressure (Note: may have affected GE valve)  . Astelin [Azelastine Hcl]     Makes me jittery  . Azelastine Other (See Comments)  . Ether     pneumonia  . Zocor [Simvastatin] Other (See Comments)    Itching w/o, muscle problems and it affected his stomach  . Metoprolol     lightheaded    Current Outpatient Medications on File Prior to Visit  Medication Sig Dispense Refill  . aspirin EC 81 MG tablet Take 81 mg by mouth daily.     . Cholecalciferol (VITAMIN D) 1000 UNITS capsule Take 1,000 Units by mouth every morning.     Water engineer Bandages & Supports (ABDOMINAL BINDER/ELASTIC LARGE) MISC Large or appropriate size Large abdominal hernia 1 each 0  . finasteride (PROSCAR) 5 MG tablet TAKE 1 TABLET BY MOUTH DAILY 90 tablet 3  . hydrocortisone cream 0.5 % Apply 1 application topically daily as needed for itching.    . losartan (COZAAR) 50 MG tablet TAKE 1 TABLET BY MOUTH EVERY DAY 90 tablet 3  . memantine (NAMENDA) 5 MG tablet Take 1 tablet daily for one week, then take 1 tablet twice daily for one week, then take 1 tablet in the morning and 2 in the evening for one week, then take 2 tablets twice daily 70 tablet 0  . nystatin cream (MYCOSTATIN) APPLY TO AFFECTED AREA TWICE A DAY (Patient taking differently: Apply 1 application topically 2 (two) times daily as needed for dry skin (rash). ) 30 g 0  . polyethylene glycol (MIRALAX / GLYCOLAX) packet Take 17 g by mouth daily as needed for moderate constipation.    . simethicone (MYLICON) 80 MG chewable tablet Chew 1 tablet (80 mg total) by mouth every 6 (six) hours as needed for flatulence. 30 tablet 0  . sucralfate (CARAFATE) 1 GM/10ML suspension Take 10 mLs (1 g total) by mouth 4 (four) times daily -  with meals and at bedtime.  420 mL 0  . timolol (TIMOPTIC) 0.5 %  ophthalmic solution Place 1 drop into both eyes 2 (two) times daily.     Marland Kitchen triamcinolone cream (KENALOG) 0.1 % APPLY TO AFFECTED AREA TWICE A DAY (Patient taking differently: Apply 1 application topically 2 (two) times daily as needed (dry skin). ) 30 g 0   No current facility-administered medications on file prior to visit.     BP 128/68 (BP Location: Left Arm, Patient Position: Sitting, Cuff Size: Normal)   Pulse 62   Temp 97.8 F (36.6 C) (Oral)   Wt 139 lb (63 kg)   SpO2 97%   BMI 21.77 kg/m       Objective:   Physical Exam  Constitutional: He appears well-developed and well-nourished.  Cardiovascular: Normal rate and regular rhythm.  Pulmonary/Chest: Effort normal and breath sounds normal.  Musculoskeletal: Normal range of motion. He exhibits no edema, tenderness or deformity.  Full ROM of all extremities. No pain to left wrist/bilateral hips, shoulders, and lumbar spine with palpation. No bruising noted   Walks with slow steady gait   Nursing note and vitals reviewed.     Assessment & Plan:  1. Impaired gait - Reassured patient and wife that xrays were negative for fracture or dislocation.  - Fall precautions reviewed  - Advised Tylenol and heating pad if needed; he may have some discomfort over the next few days  - Follow up as needed  [name], NP

## 2018-08-05 ENCOUNTER — Ambulatory Visit (INDEPENDENT_AMBULATORY_CARE_PROVIDER_SITE_OTHER): Payer: MEDICARE | Admitting: Adult Health

## 2018-08-05 ENCOUNTER — Telehealth: Payer: Self-pay | Admitting: Adult Health

## 2018-08-05 ENCOUNTER — Encounter: Payer: Self-pay | Admitting: Adult Health

## 2018-08-05 VITALS — BP 120/70 | Temp 97.9°F | Wt 140.2 lb

## 2018-08-05 DIAGNOSIS — Z79899 Other long term (current) drug therapy: Secondary | ICD-10-CM

## 2018-08-05 NOTE — Telephone Encounter (Signed)
Copied from [place] [id number]. Topic: General - Other >> Aug 05, 2018 10:08 AM Synthia Innocent wrote: Reason for CRM: Requesting to speak with nurse regarding patient before his appt today. States its very important she speak with nurse prior to appt

## 2018-08-05 NOTE — Progress Notes (Signed)
Subjective:    Patient ID: Jack Noble, male    DOB: [birth year], 84 y.o.   MRN: [id number]  HPI 84 year old male who  has a past medical history of Allergy, Aphasia (12/26/2013), Cataracts, bilateral, Corneal abrasion, Dermatophytosis of nail, Diverticulosis, Epistaxis, Glaucoma, Glaucoma, Hearing loss, History of GI diverticular bleed (710/15), Hyperlipidemia, Hyperplasia of prostate without urinary obstruction, Hypertension, Rosacea, Sepsis due to Escherichia coli (E. coli) ([place]) (05/2011), and TIA (transient ischemic attack) (9/6-05/2012).  Patient presents to the office today with his wife.  His wife reports that over the last few days [name] has refused to take Cozaar and Finasteride. She reports that he eventually took the medication, except for today.    Review of Systems See HPI   Past Medical History:  Diagnosis Date  . Allergy   . Aphasia 12/26/2013   Dr Jannifer Franklin  . Cataracts, bilateral   . Corneal abrasion    Dr. Ned Clines, [facility]  . Dermatophytosis of nail   . Diverticulosis   . Epistaxis   . Glaucoma    Dr. Edilia Bo  . Glaucoma   . Hearing loss   . History of GI diverticular bleed 710/15  . Hyperlipidemia   . Hyperplasia of prostate without urinary obstruction   . Hypertension   . Rosacea    Dr. Marge Duncans  . Sepsis due to Escherichia coli (E. coli) ([place]) 05/2011   Morganella also cultured  . TIA (transient ischemic attack) 9/6-05/2012   BP 221/99    Social History   Socioeconomic History  . Marital status: Married    Spouse name: Not on file  . Number of children: 1  . Years of education: college  . Highest education level: Not on file  Occupational History  . Occupation: [employer]    Employer: RETIRED  Social Needs  . Financial resource strain: Not on file  . Food insecurity:    Worry: Not on file    Inability: Not on file  . Transportation needs:    Medical: Not on file    Non-medical: Not on file  Tobacco Use  . Smoking status: Never  Smoker  . Smokeless tobacco: Never Used  Substance and Sexual Activity  . Alcohol use: No  . Drug use: No  . Sexual activity: Not on file  Lifestyle  . Physical activity:    Days per week: Not on file    Minutes per session: Not on file  . Stress: Not on file  Relationships  . Social connections:    Talks on phone: Not on file    Gets together: Not on file    Attends religious service: Not on file    Active member of club or organization: Not on file    Attends meetings of clubs or organizations: Not on file    Relationship status: Not on file  . Intimate partner violence:    Fear of current or ex partner: Not on file    Emotionally abused: Not on file    Physically abused: Not on file    Forced sexual activity: Not on file  Other Topics Concern  . Not on file  Social History Narrative   Lives with his wife.     Was in the [employer] for 15 years   Left handed    Past Surgical History:  Procedure Laterality Date  . APPENDECTOMY     age 30  . CATARACT EXTRACTION, BILATERAL  2003   Dr Ishmael Holter (now seen @  [facility])  . CHOLECYSTECTOMY  1978   stones  . COLONOSCOPY W/ POLYPECTOMY  06/2009   Dr.Jacobs; "miniscule polyp"  . HERNIA REPAIR    . INGUINAL HERNIA REPAIR     age 26  . SKIN BIOPSY  12/2014   done by dermatologist   . TONSILLECTOMY AND ADENOIDECTOMY    . UPPER GI ENDOSCOPY  05/2014   negative    Family History  Problem Relation Age of Onset  . Stroke Mother 73  . Parkinsonism Brother        Brother died at age at age 72  . Pulmonary embolism Father 48       Post Op  . Diabetes Neg Hx   . Cancer Neg Hx   . COPD Neg Hx   . Heart disease Neg Hx     Allergies  Allergen Reactions  . Amoxicillin     urticaria  . Codeine     Rash Because of a history of documented adverse serious drug reaction;Medi Alert bracelet  is recommended  . Minocycline Hcl     REACTION: nervous/chest pressure (Note: may have affected GE valve)  . Astelin [Azelastine Hcl]      Makes me jittery  . Azelastine Other (See Comments)  . Ether     pneumonia  . Zocor [Simvastatin] Other (See Comments)    Itching w/o, muscle problems and it affected his stomach  . Metoprolol     lightheaded    Current Outpatient Medications on File Prior to Visit  Medication Sig Dispense Refill  . aspirin EC 81 MG tablet Take 81 mg by mouth daily.     . Cholecalciferol (VITAMIN D) 1000 UNITS capsule Take 1,000 Units by mouth every morning.     Water engineer Bandages & Supports (ABDOMINAL BINDER/ELASTIC LARGE) MISC Large or appropriate size Large abdominal hernia 1 each 0  . finasteride (PROSCAR) 5 MG tablet TAKE 1 TABLET BY MOUTH DAILY 90 tablet 3  . hydrocortisone cream 0.5 % Apply 1 application topically daily as needed for itching.    . losartan (COZAAR) 50 MG tablet TAKE 1 TABLET BY MOUTH EVERY DAY 90 tablet 3  . memantine (NAMENDA) 5 MG tablet Take 1 tablet daily for one week, then take 1 tablet twice daily for one week, then take 1 tablet in the morning and 2 in the evening for one week, then take 2 tablets twice daily 70 tablet 0  . nystatin cream (MYCOSTATIN) APPLY TO AFFECTED AREA TWICE A DAY (Patient taking differently: Apply 1 application topically 2 (two) times daily as needed for dry skin (rash). ) 30 g 0  . polyethylene glycol (MIRALAX / GLYCOLAX) packet Take 17 g by mouth daily as needed for moderate constipation.    . simethicone (MYLICON) 80 MG chewable tablet Chew 1 tablet (80 mg total) by mouth every 6 (six) hours as needed for flatulence. 30 tablet 0  . sucralfate (CARAFATE) 1 GM/10ML suspension Take 10 mLs (1 g total) by mouth 4 (four) times daily -  with meals and at bedtime. 420 mL 0  . timolol (TIMOPTIC) 0.5 % ophthalmic solution Place 1 drop into both eyes 2 (two) times daily.     Marland Kitchen triamcinolone cream (KENALOG) 0.1 % APPLY TO AFFECTED AREA TWICE A DAY (Patient taking differently: Apply 1 application topically 2 (two) times daily as needed (dry skin). ) 30 g 0   No  current facility-administered medications on file prior to visit.     BP 120/70 (BP  Location: Left Arm, Patient Position: Sitting, Cuff Size: Normal)   Temp 97.9 F (36.6 C) (Oral)   Wt 140 lb 3.2 oz (63.6 kg)   BMI 21.96 kg/m       Objective:   Physical Exam  Constitutional: He appears well-developed and well-nourished. No distress.  Cardiovascular: Normal rate, regular rhythm, normal heart sounds and intact distal pulses.  Pulmonary/Chest: Effort normal and breath sounds normal.  Neurological: He is alert.  Aphasic    Skin: Skin is dry. He is not diaphoretic.  Psychiatric: He has a normal mood and affect. His behavior is normal. Judgment and thought content normal.  Nursing note and vitals reviewed.     Assessment & Plan:  1. Medication management - Explained to Burnie why he was taking the medication and why it was important to take on a daily basis.  - Explained to his wife that if Rudolph refused medications she can try to give it to him at a later time  - Follow up as needed  [name], NP

## 2018-08-09 NOTE — Telephone Encounter (Signed)
Pt seen in [place] on 08/05/18.

## 2018-08-16 NOTE — Progress Notes (Signed)
Subjective:   Jack Noble is a 84 y.o. male who presents for Medicare Annual/Subsequent preventive examination.  Reports health as fair.  Wife interprets for spouse. Getting a little exasperated with a few changes.  Referred to the [facility] last year for long term planning    Sees Dr. Jannifer Franklin for neurology  States he tried aricept and he could not take it  Namenda 72m - will see Dr. WJannifer Franklinin October 2 Will discuss at that time. Noted to titrate up but Mr. BAdelsbergerrefusing meds some days. Will try to give him one per day if he will take it.   He is not getting up at the same time every day  Now sleeping later till 11 At times, refuses to take his meals Eats breakfast and then lunch Still few BM issues; mira lax   Had to push hernia back in ER no 1 Has had a fall in august no2 Choked on a pill - no 3  Dr. WJannifer FranklinOct 29th  Bathing and dressing himself    Diet  Weight fluctuating 140 and 138;  Takes natural alternatives probiotic  Wife cooks Basic nutrients;  Oatmeal and toast for breakfast TKuwaitsandwich and fruit Fish; and vegetable or other full meal for supper    BMI- 23   Exercise  They walk a lot together Does not do this as much as they would like to They live in a circle and used to walk but not so much anymore  Has a church family   Dr. HAndria Frames Open angle glaucoma OS ; scratched cornina od Dr. GDarlys Gales No hearing issues      There are no preventive care reminders to display for this patient.    Cardiac Risk Factors include: advanced age (>557m, >6>74omen);family history of premature cardiovascular disease;hypertension;male gender     Objective:    Vitals: BP 122/70   Pulse (!) 56   Ht '5\' 5"'  (1.651 m)   Wt 141 lb (64 kg)   SpO2 97%   BMI 23.46 kg/m   Body mass index is 23.46 kg/m.  Advanced Directives 06/21/2018 06/16/2018 11/07/2017 08/13/2017 07/24/2017 07/14/2016 06/05/2016  Does Patient Have a Medical Advance Directive? No Yes No  Yes No Yes Yes  Type of Advance Directive - HeBeaverdamiving will - - - - HePress photographeriving will  Does patient want to make changes to medical advance directive? - - - - - - -  Copy of [place]? - No - copy requested - - - - -  Pre-existing out of facility DNR order (yellow form or pink MOST form) - - - - - - -    Tobacco Social History   Tobacco Use  Smoking Status Never Smoker  Smokeless Tobacco Never Used     Counseling given: Yes   Clinical Intake:     Past Medical History:  Diagnosis Date  . Allergy   . Aphasia 12/26/2013   Dr WiJannifer Franklin. Cataracts, bilateral   . Corneal abrasion    [facility]  . Dermatophytosis of nail   . Diverticulosis   . Epistaxis   . Glaucoma    Dr. BoEdilia Bo. Glaucoma   . Hearing loss   . History of GI diverticular bleed 710/15  . Hyperlipidemia   . Hyperplasia of prostate without urinary obstruction   . Hypertension   . Rosacea    Dr. TaMarge Duncans.  Sepsis due to Escherichia coli (E. coli) ([place]) 05/2011   Morganella also cultured  . TIA (transient ischemic attack) 9/6-05/2012   BP 221/99   Past Surgical History:  Procedure Laterality Date  . APPENDECTOMY     age 85  . CATARACT EXTRACTION, BILATERAL  2003   Dr Ishmael Holter (now seen @ [facility])  . CHOLECYSTECTOMY  1978   stones  . COLONOSCOPY W/ POLYPECTOMY  06/2009   Dr.Jacobs; "miniscule polyp"  . HERNIA REPAIR    . INGUINAL HERNIA REPAIR     age 21  . SKIN BIOPSY  12/2014   done by dermatologist   . TONSILLECTOMY AND ADENOIDECTOMY    . UPPER GI ENDOSCOPY  05/2014   negative   Family History  Problem Relation Age of Onset  . Stroke Mother 78  . Parkinsonism Brother        Brother died at age at age 21  . Pulmonary embolism Father 32       Post Op  . Diabetes Neg Hx   . Cancer Neg Hx   . COPD Neg Hx   . Heart disease Neg Hx    Social History   Socioeconomic History  . Marital status: Married     Spouse name: Not on file  . Number of children: 1  . Years of education: college  . Highest education level: Not on file  Occupational History  . Occupation: [employer]    Employer: RETIRED  Social Needs  . Financial resource strain: Not on file  . Food insecurity:    Worry: Not on file    Inability: Not on file  . Transportation needs:    Medical: Not on file    Non-medical: Not on file  Tobacco Use  . Smoking status: Never Smoker  . Smokeless tobacco: Never Used  Substance and Sexual Activity  . Alcohol use: No  . Drug use: No  . Sexual activity: Not on file  Lifestyle  . Physical activity:    Days per week: Not on file    Minutes per session: Not on file  . Stress: Not on file  Relationships  . Social connections:    Talks on phone: Not on file    Gets together: Not on file    Attends religious service: Not on file    Active member of club or organization: Not on file    Attends meetings of clubs or organizations: Not on file    Relationship status: Not on file  Other Topics Concern  . Not on file  Social History Narrative   Lives with his wife.     Was in the [employer] for 15 years   Left handed    Outpatient Encounter Medications as of 08/17/2018  Medication Sig  . aspirin EC 81 MG tablet Take 81 mg by mouth daily.   . Cholecalciferol (VITAMIN D) 1000 UNITS capsule Take 1,000 Units by mouth every morning.   Water engineer Bandages & Supports (ABDOMINAL BINDER/ELASTIC LARGE) MISC Large or appropriate size Large abdominal hernia  . finasteride (PROSCAR) 5 MG tablet TAKE 1 TABLET BY MOUTH DAILY  . hydrocortisone cream 0.5 % Apply 1 application topically daily as needed for itching.  . losartan (COZAAR) 50 MG tablet TAKE 1 TABLET BY MOUTH EVERY DAY  . memantine (NAMENDA) 5 MG tablet Take 1 tablet daily for one week, then take 1 tablet twice daily for one week, then take 1 tablet in the morning and 2 in the  evening for one week, then take 2 tablets twice daily    . nystatin cream (MYCOSTATIN) APPLY TO AFFECTED AREA TWICE A DAY (Patient taking differently: Apply 1 application topically 2 (two) times daily as needed for dry skin (rash). )  . polyethylene glycol (MIRALAX / GLYCOLAX) packet Take 17 g by mouth daily as needed for moderate constipation.  . simethicone (MYLICON) 80 MG chewable tablet Chew 1 tablet (80 mg total) by mouth every 6 (six) hours as needed for flatulence.  . sucralfate (CARAFATE) 1 GM/10ML suspension Take 10 mLs (1 g total) by mouth 4 (four) times daily -  with meals and at bedtime.  . timolol (TIMOPTIC) 0.5 % ophthalmic solution Place 1 drop into both eyes 2 (two) times daily.   Marland Kitchen triamcinolone cream (KENALOG) 0.1 % APPLY TO AFFECTED AREA TWICE A DAY (Patient taking differently: Apply 1 application topically 2 (two) times daily as needed (dry skin). )   No facility-administered encounter medications on file as of 08/17/2018.     Activities of Daily Living In your present state of health, do you have any difficulty performing the following activities: 08/17/2018  Hearing? N  Vision? N  Difficulty concentrating or making decisions? Y  Comment difficult to evaluate due to aphasia   Walking or climbing stairs? N  Dressing or bathing? N  Doing errands, shopping? N  Preparing Food and eating ? N  Using the Toilet? N  In the past six months, have you accidently leaked urine? N  Do you have problems with loss of bowel control? N  Managing your Medications? N  Managing your Finances? N  Housekeeping or managing your Housekeeping? N  Some recent data might be hidden    Patient Care Team: [name], NP as PCP - General (Family Medicine)   Assessment:   This is a routine wellness examination for Darick.  Exercise Activities and Dietary recommendations Current Exercise Habits: Home exercise routine, Intensity: Mild  Goals    . patient     Would like to walk a little and maintain his health     . Patient Stated     Ride  with the tide, one day at a time.        Fall Risk Fall Risk  08/17/2018 08/13/2017 07/17/2016 07/14/2016 07/08/2016  Falls in the past year? Yes No No Yes Yes  Comment - - Emmi Telephone Survey: data to providers prior to load - -  Number falls in past yr: 1 - - - 1  Injury with Fall? Yes - - - No  Comment bruise on his hip  - - - -  Risk for fall due to : - - - - History of fall(s)  Follow up (No Data) - - - -  Comment fell at table, getting feet mixed up  - - - -     Depression Screen PHQ 2/9 Scores 08/17/2018 08/13/2017 07/14/2016 07/08/2016  PHQ - 2 Score 0 0 0 0    Cognitive Function MMSE - Mini Mental State Exam 08/17/2018 08/13/2017 03/01/2017 09/30/2015  Not completed: (No Data) Unable to complete - -  Orientation to time - - 1 5  Orientation to Place - - 0 2  Registration - - 2 3  Attention/ Calculation - - 0 2  Recall - - 0 0  Language- name 2 objects - - 2 2  Language- repeat - - 0 0  Language- follow 3 step command - - 0 1  Language- read & follow  direction - - 0 1  Write a sentence - - 0 1  Copy design - - 0 0  Total score - - 5 17    Appears to be a little more agitated. Declining meds some days. Does not drive anymore. Still functioning at home.  Sometimes sleeping in and wife has to cook breakfast x 2     Immunization History  Administered Date(s) Administered  . Influenza Split 11/03/2011, 08/23/2012  . Influenza, High Dose Seasonal PF 08/15/2014, 07/21/2016, 07/21/2017, 08/17/2018  . Influenza,inj,Quad PF,6+ Mos 09/01/2013, 08/06/2015  . Pneumococcal Conjugate-13 07/16/2015  . Pneumococcal Polysaccharide-23 01/26/2008  . Tdap 02/15/2004, 06/05/2015    Screening Tests Health Maintenance  Topic Date Due  . TETANUS/TDAP  06/04/2025  . INFLUENZA VACCINE  Completed  . PNA vac Low Risk Adult  Completed       Plan:      PCP Notes   Health Maintenance Was able to get his high dose flu vaccine today. Wife providing more care and oversight;  Refusing  meds Recommended diversion and if he declines meds, try again later.    Abnormal Screens  MMSE waived to neurology    Referrals  none  Patient concerns; Sees Dr. Jannifer Franklin for neurology  States he tried aricept and he could not take it  Namenda 83m - will see Dr. WJannifer Franklinin October 2 Will discuss at that time. Noted to titrate up but Mr. BWencerefusing meds some days. Will try to give him one per day if he will take it.   Nurse Concerns; As noted   Next PCP apt Just seen CDorothyann PengNP        I have personally reviewed and noted the following in the patient's chart:   . Medical and social history . Use of alcohol, tobacco or illicit drugs  . Current medications and supplements . Functional ability and status . Nutritional status . Physical activity . Advanced directives . List of other physicians . Hospitalizations, surgeries, and ER visits in previous 12 months . Vitals . Screenings to include cognitive, depression, and falls . Referrals and appointments  In addition, I have reviewed and discussed with patient certain preventive protocols, quality metrics, and best practice recommendations. A written personalized care plan for preventive services as well as general preventive health recommendations were provided to patient.     HWynetta Fines RN  08/17/2018

## 2018-08-17 ENCOUNTER — Ambulatory Visit (INDEPENDENT_AMBULATORY_CARE_PROVIDER_SITE_OTHER): Payer: MEDICARE

## 2018-08-17 VITALS — BP 122/70 | HR 56 | Ht 65.0 in | Wt 141.0 lb

## 2018-08-17 DIAGNOSIS — Z23 Encounter for immunization: Secondary | ICD-10-CM

## 2018-08-17 DIAGNOSIS — Z0000 Encounter for general adult medical examination without abnormal findings: Secondary | ICD-10-CM

## 2018-08-17 NOTE — Patient Instructions (Addendum)
Jack Noble , Thank you for taking time to come for your Medicare Wellness Visit. I appreciate your ongoing commitment to your health goals. Please review the following plan we discussed and let me know if I can assist you in the future.   Will come back in to the get the high dose as we are out today  Please take your medicine   Please to walk some   You have to take at the pharmacy as it is under Part D Shingrix is a vaccine for the prevention of Shingles in Adults 50 and older.  If you are on Medicare, the shingrix is covered under your Part D plan, so you will take both of the vaccines in the series at your pharmacy. Please check with your benefits regarding applicable copays or out of pocket expenses.  The Shingrix is given in 2 vaccines approx 8 weeks apart. You must receive the 2nd dose prior to 6 months from receipt of the first. Please have the pharmacist print out you Immunization  dates for our office records    These are the goals we discussed: Goals    . patient     Would like to walk a little and maintain his health     . Patient Stated     Ride with the tide, one day at a time.        This is a list of the screening recommended for you and due dates:  Health Maintenance  Topic Date Due  . Flu Shot  06/23/2018  . Tetanus Vaccine  06/04/2025  . Pneumonia vaccines  Completed      Fall Prevention in the Home Falls can cause injuries. They can happen to people of all ages. There are many things you can do to make your home safe and to help prevent falls. What can I do on the outside of my home?  Regularly fix the edges of walkways and driveways and fix any cracks.  Remove anything that might make you trip as you walk through a door, such as a raised step or threshold.  Trim any bushes or trees on the path to your home.  Use bright outdoor lighting.  Clear any walking paths of anything that might make someone trip, such as rocks or tools.  Regularly check  to see if handrails are loose or broken. Make sure that both sides of any steps have handrails.  Any raised decks and porches should have guardrails on the edges.  Have any leaves, snow, or ice cleared regularly.  Use sand or salt on walking paths during winter.  Clean up any spills in your garage right away. This includes oil or grease spills. What can I do in the bathroom?  Use night lights.  Install grab bars by the toilet and in the tub and shower. Do not use towel bars as grab bars.  Use non-skid mats or decals in the tub or shower.  If you need to sit down in the shower, use a plastic, non-slip stool.  Keep the floor dry. Clean up any water that spills on the floor as soon as it happens.  Remove soap buildup in the tub or shower regularly.  Attach bath mats securely with double-sided non-slip rug tape.  Do not have throw rugs and other things on the floor that can make you trip. What can I do in the bedroom?  Use night lights.  Make sure that you have a light by your bed that  is easy to reach.  Do not use any sheets or blankets that are too big for your bed. They should not hang down onto the floor.  Have a firm chair that has side arms. You can use this for support while you get dressed.  Do not have throw rugs and other things on the floor that can make you trip. What can I do in the kitchen?  Clean up any spills right away.  Avoid walking on wet floors.  Keep items that you use a lot in easy-to-reach places.  If you need to reach something above you, use a strong step stool that has a grab bar.  Keep electrical cords out of the way.  Do not use floor polish or wax that makes floors slippery. If you must use wax, use non-skid floor wax.  Do not have throw rugs and other things on the floor that can make you trip. What can I do with my stairs?  Do not leave any items on the stairs.  Make sure that there are handrails on both sides of the stairs and use  them. Fix handrails that are broken or loose. Make sure that handrails are as long as the stairways.  Check any carpeting to make sure that it is firmly attached to the stairs. Fix any carpet that is loose or worn.  Avoid having throw rugs at the top or bottom of the stairs. If you do have throw rugs, attach them to the floor with carpet tape.  Make sure that you have a light switch at the top of the stairs and the bottom of the stairs. If you do not have them, ask someone to add them for you. What else can I do to help prevent falls?  Wear shoes that: ? Do not have high heels. ? Have rubber bottoms. ? Are comfortable and fit you well. ? Are closed at the toe. Do not wear sandals.  If you use a stepladder: ? Make sure that it is fully opened. Do not climb a closed stepladder. ? Make sure that both sides of the stepladder are locked into place. ? Ask someone to hold it for you, if possible.  Clearly mark and make sure that you can see: ? Any grab bars or handrails. ? First and last steps. ? Where the edge of each step is.  Use tools that help you move around (mobility aids) if they are needed. These include: ? Canes. ? Walkers. ? Scooters. ? Crutches.  Turn on the lights when you go into a dark area. Replace any light bulbs as soon as they burn out.  Set up your furniture so you have a clear path. Avoid moving your furniture around.  If any of your floors are uneven, fix them.  If there are any pets around you, be aware of where they are.  Review your medicines with your doctor. Some medicines can make you feel dizzy. This can increase your chance of falling. Ask your doctor what other things that you can do to help prevent falls. This information is not intended to replace advice given to you by your health care provider. Make sure you discuss any questions you have with your health care provider. Document Released: 09/05/2009 Document Revised: 04/16/2016 Document Reviewed:  12/14/2014 Elsevier Interactive Patient Education  2018 [place] Maintenance, Male A healthy lifestyle and preventive care is important for your health and wellness. Ask your health care provider about what schedule of  regular examinations is right for you. What should I know about weight and diet? Eat a Healthy Diet  Eat plenty of vegetables, fruits, whole grains, low-fat dairy products, and lean protein.  Do not eat a lot of foods high in solid fats, added sugars, or salt.  Maintain a Healthy Weight Regular exercise can help you achieve or maintain a healthy weight. You should:  Do at least 150 minutes of exercise each week. The exercise should increase your heart rate and make you sweat (moderate-intensity exercise).  Do strength-training exercises at least twice a week.  Watch Your Levels of Cholesterol and Blood Lipids  Have your blood tested for lipids and cholesterol every 5 years starting at 82 years of age. If you are at high risk for heart disease, you should start having your blood tested when you are 82 years old. You may need to have your cholesterol levels checked more often if: ? Your lipid or cholesterol levels are high. ? You are older than 82 years of age. ? You are at high risk for heart disease.  What should I know about cancer screening? Many types of cancers can be detected early and may often be prevented. Lung Cancer  You should be screened every year for lung cancer if: ? You are a current smoker who has smoked for at least 30 years. ? You are a former smoker who has quit within the past 15 years.  Talk to your health care provider about your screening options, when you should start screening, and how often you should be screened.  Colorectal Cancer  Routine colorectal cancer screening usually begins at 82 years of age and should be repeated every 5-10 years until you are 82 years old. You may need to be screened more often if early forms  of precancerous polyps or small growths are found. Your health care provider may recommend screening at an earlier age if you have risk factors for colon cancer.  Your health care provider may recommend using home test kits to check for hidden blood in the stool.  A small camera at the end of a tube can be used to examine your colon (sigmoidoscopy or colonoscopy). This checks for the earliest forms of colorectal cancer.  Prostate and Testicular Cancer  Depending on your age and overall health, your health care provider may do certain tests to screen for prostate and testicular cancer.  Talk to your health care provider about any symptoms or concerns you have about testicular or prostate cancer.  Skin Cancer  Check your skin from head to toe regularly.  Tell your health care provider about any new moles or changes in moles, especially if: ? There is a change in a mole's size, shape, or color. ? You have a mole that is larger than a pencil eraser.  Always use sunscreen. Apply sunscreen liberally and repeat throughout the day.  Protect yourself by wearing long sleeves, pants, a wide-brimmed hat, and sunglasses when outside.  What should I know about heart disease, diabetes, and high blood pressure?  If you are 67-64 years of age, have your blood pressure checked every 3-5 years. If you are 25 years of age or older, have your blood pressure checked every year. You should have your blood pressure measured twice-once when you are at a hospital or clinic, and once when you are not at a hospital or clinic. Record the average of the two measurements. To check your blood pressure when you are not at  a hospital or clinic, you can use: ? An automated blood pressure machine at a pharmacy. ? A home blood pressure monitor.  Talk to your health care provider about your target blood pressure.  If you are between 47-88 years old, ask your health care provider if you should take aspirin to prevent heart  disease.  Have regular diabetes screenings by checking your fasting blood sugar level. ? If you are at a normal weight and have a low risk for diabetes, have this test once every three years after the age of 42. ? If you are overweight and have a high risk for diabetes, consider being tested at a younger age or more often.  A one-time screening for abdominal aortic aneurysm (AAA) by ultrasound is recommended for men aged 47-75 years who are current or former smokers. What should I know about preventing infection? Hepatitis B If you have a higher risk for hepatitis B, you should be screened for this virus. Talk with your health care provider to find out if you are at risk for hepatitis B infection. Hepatitis C Blood testing is recommended for:  Everyone born from [birth year] through 1963.  Anyone with known risk factors for hepatitis C.  Sexually Transmitted Diseases (STDs)  You should be screened each year for STDs including gonorrhea and chlamydia if: ? You are sexually active and are younger than 82 years of age. ? You are older than 82 years of age and your health care provider tells you that you are at risk for this type of infection. ? Your sexual activity has changed since you were last screened and you are at an increased risk for chlamydia or gonorrhea. Ask your health care provider if you are at risk.  Talk with your health care provider about whether you are at high risk of being infected with HIV. Your health care provider may recommend a prescription medicine to help prevent HIV infection.  What else can I do?  Schedule regular health, dental, and eye exams.  Stay current with your vaccines (immunizations).  Do not use any tobacco products, such as cigarettes, chewing tobacco, and e-cigarettes. If you need help quitting, ask your health care provider.  Limit alcohol intake to no more than 2 drinks per day. One drink equals 12 ounces of beer, 5 ounces of wine, or 1 ounces of  hard liquor.  Do not use street drugs.  Do not share needles.  Ask your health care provider for help if you need support or information about quitting drugs.  Tell your health care provider if you often feel depressed.  Tell your health care provider if you have ever been abused or do not feel safe at home. This information is not intended to replace advice given to you by your health care provider. Make sure you discuss any questions you have with your health care provider. Document Released: 05/07/2008 Document Revised: 07/08/2016 Document Reviewed: 08/13/2015 Elsevier Interactive Patient Education  [employer].

## 2018-08-17 NOTE — Progress Notes (Signed)
I have reviewed documentation for AWV and Advance Care Planning provided by the health coach and agree with documentation. I was immediately available for questions.  

## 2018-09-05 ENCOUNTER — Telehealth: Payer: Self-pay | Admitting: Neurology

## 2018-09-05 ENCOUNTER — Encounter: Payer: Self-pay | Admitting: Family Medicine

## 2018-09-05 ENCOUNTER — Ambulatory Visit (INDEPENDENT_AMBULATORY_CARE_PROVIDER_SITE_OTHER): Payer: MEDICARE

## 2018-09-05 ENCOUNTER — Ambulatory Visit (INDEPENDENT_AMBULATORY_CARE_PROVIDER_SITE_OTHER): Payer: MEDICARE | Admitting: Family Medicine

## 2018-09-05 VITALS — BP 130/60 | HR 76 | Temp 98.1°F | Wt 139.6 lb

## 2018-09-05 DIAGNOSIS — R410 Disorientation, unspecified: Secondary | ICD-10-CM

## 2018-09-05 DIAGNOSIS — J984 Other disorders of lung: Secondary | ICD-10-CM | POA: Diagnosis not present

## 2018-09-05 DIAGNOSIS — R350 Frequency of micturition: Secondary | ICD-10-CM | POA: Diagnosis not present

## 2018-09-05 LAB — COMPREHENSIVE METABOLIC PANEL
ALT: 9 U/L (ref 0–53)
AST: 13 U/L (ref 0–37)
Albumin: 3.9 g/dL (ref 3.5–5.2)
Alkaline Phosphatase: 50 U/L (ref 39–117)
BUN: 14 mg/dL (ref 6–23)
CHLORIDE: 102 meq/L (ref 96–112)
CO2: 31 meq/L (ref 19–32)
Calcium: 8.9 mg/dL (ref 8.4–10.5)
Creatinine, Ser: 0.66 mg/dL (ref 0.40–1.50)
GFR: 121.17 mL/min (ref >60.00)
GLUCOSE: 111 mg/dL — AB (ref 70–99)
Potassium: 3.7 mEq/L (ref 3.5–5.1)
SODIUM: 137 meq/L (ref 135–145)
Total Bilirubin: 0.7 mg/dL (ref 0.2–1.2)
Total Protein: 6.4 g/dL (ref 6.0–8.3)

## 2018-09-05 LAB — CBC WITH DIFFERENTIAL/PLATELET
BASOS PCT: 0.5 % (ref 0.0–3.0)
Basophils Absolute: 0 10*3/uL (ref 0.0–0.1)
EOS PCT: 0.1 % (ref 0.0–5.0)
Eosinophils Absolute: 0 10*3/uL (ref 0.0–0.7)
HCT: 39 % (ref 39.0–52.0)
HEMOGLOBIN: 13.1 g/dL (ref 13.0–17.0)
LYMPHS PCT: 8.2 % — AB (ref 12.0–46.0)
Lymphs Abs: 0.6 10*3/uL — ABNORMAL LOW (ref 0.7–4.0)
MCHC: 33.5 g/dL (ref 30.0–36.0)
MCV: 97.7 fl (ref 78.0–100.0)
Monocytes Absolute: 0.5 10*3/uL (ref 0.1–1.0)
Monocytes Relative: 6.9 % (ref 3.0–12.0)
Neutro Abs: 6.2 10*3/uL (ref 1.4–7.7)
Neutrophils Relative %: 84.3 % — ABNORMAL HIGH (ref 43.0–77.0)
Platelets: 166 10*3/uL (ref 150.0–400.0)
RBC: 3.99 Mil/uL — ABNORMAL LOW (ref 4.22–5.81)
RDW: 13.1 % (ref 11.5–15.5)
WBC: 7.3 10*3/uL (ref 4.0–10.5)

## 2018-09-05 LAB — URINALYSIS
BILIRUBIN URINE: NEGATIVE
HGB URINE DIPSTICK: NEGATIVE
Ketones, ur: NEGATIVE
LEUKOCYTES UA: NEGATIVE
NITRITE: NEGATIVE
Specific Gravity, Urine: 1.025 (ref 1.000–1.030)
Total Protein, Urine: NEGATIVE
Urine Glucose: NEGATIVE
Urobilinogen, UA: 0.2 (ref 0.0–1.0)
pH: 6 (ref 5.0–8.0)

## 2018-09-05 LAB — TSH: TSH: 0.61 u[IU]/mL (ref 0.35–4.50)

## 2018-09-05 MED ORDER — SULFAMETHOXAZOLE-TRIMETHOPRIM 800-160 MG PO TABS: 1.0000 | ORAL_TABLET | Freq: Two times a day (BID) | ORAL | 0 refills | Status: AC

## 2018-09-05 NOTE — Patient Instructions (Signed)
Labwork evaluation now. I will call you later this afternoon with results. If worsening of symptoms go to ER.   Consider Sketcher shoes

## 2018-09-05 NOTE — Telephone Encounter (Signed)
Talk with Dr. Ethlyn Gallery.  The patient has had some possible confusion, talking about water contamination last evening.  The patient has a chronic issue with aphasia, he has difficulty understanding and generating language, his language usually is nonsensical.  It is difficult to determine what he is talking about most of the time.  The patient is being evaluated for possible urinary tract infection, he has reported some urinary frequency.  The patient will be evaluated through this office in the near future.  He has a revisit scheduled on 20 September 2018.

## 2018-09-05 NOTE — Progress Notes (Signed)
Jack Noble DOB: 05-03-1929 Encounter date: 09/05/2018  This is a 84 y.o. male who presents with Chief Complaint  Patient presents with  . urinary symptoms    able to use the restroom but volume is decreased, would not drink water, pt did not sleep much last night, wife states that he talked all night but wasn't making since,    History of present illness: Hasn't been able able to take Namenda. Not great about taking pills.   Having some muscle cramps.   Wife up all night with him. He was saying things like he was looking for book looking for "raises"; then found book and wanted him to call friend who works on these things. Then thought bottle of water was contaminated. Wanted to call water company, but wife was able to get him to redirect. Still more issues all night; moaning/groining like in pain.   Face flushed; but no fever.   Has some urgency and frequency at baseline. Takes finasteride for prostate. Wife states that this has possibly been worse in last couple of days; notes that it interrupts activities (like grocery store). Wife has noted this for a few days.   Food intake has been ok; just water intake hasn't been great.     Allergies  Allergen Reactions  . Amoxicillin     urticaria  . Codeine     Rash Because of a history of documented adverse serious drug reaction;Medi Alert bracelet  is recommended  . Minocycline Hcl     REACTION: nervous/chest pressure (Note: may have affected GE valve)  . Astelin [Azelastine Hcl]     Makes me jittery  . Azelastine Other (See Comments)  . Ether     pneumonia  . Zocor [Simvastatin] Other (See Comments)    Itching w/o, muscle problems and it affected his stomach  . Metoprolol     lightheaded   Current Meds  Medication Sig  . aspirin EC 81 MG tablet Take 81 mg by mouth daily.   . Cholecalciferol (VITAMIN D) 1000 UNITS capsule Take 1,000 Units by mouth every morning.   Water engineer Bandages & Supports (ABDOMINAL  BINDER/ELASTIC LARGE) MISC Large or appropriate size Large abdominal hernia  . finasteride (PROSCAR) 5 MG tablet TAKE 1 TABLET BY MOUTH DAILY  . hydrocortisone cream 0.5 % Apply 1 application topically daily as needed for itching.  . losartan (COZAAR) 50 MG tablet TAKE 1 TABLET BY MOUTH EVERY DAY  . nystatin cream (MYCOSTATIN) APPLY TO AFFECTED AREA TWICE A DAY (Patient taking differently: Apply 1 application topically 2 (two) times daily as needed for dry skin (rash). )  . polyethylene glycol (MIRALAX / GLYCOLAX) packet Take 17 g by mouth daily as needed for moderate constipation.  . simethicone (MYLICON) 80 MG chewable tablet Chew 1 tablet (80 mg total) by mouth every 6 (six) hours as needed for flatulence.  . sucralfate (CARAFATE) 1 GM/10ML suspension Take 10 mLs (1 g total) by mouth 4 (four) times daily -  with meals and at bedtime.  . timolol (TIMOPTIC) 0.5 % ophthalmic solution Place 1 drop into both eyes 2 (two) times daily.   Marland Kitchen triamcinolone cream (KENALOG) 0.1 % APPLY TO AFFECTED AREA TWICE A DAY (Patient taking differently: Apply 1 application topically 2 (two) times daily as needed (dry skin). )    Review of Systems  Constitutional: Negative for activity change, appetite change, fatigue and fever.  HENT: Negative for congestion.   Respiratory: Negative for cough, shortness of breath and  wheezing.   Cardiovascular: Negative for chest pain and leg swelling.  Gastrointestinal: Negative for abdominal pain, constipation, diarrhea and vomiting.  Genitourinary: Positive for difficulty urinating and frequency.  Neurological: Positive for speech difficulty (chronic aphasia). Negative for facial asymmetry and weakness.  Psychiatric/Behavioral: Positive for sleep disturbance (see HPI). Agitation: mild, intermittent but easily re-directed.    Objective:  BP 130/60 (BP Location: Left Arm, Patient Position: Sitting, Cuff Size: Normal)   Pulse 76   Temp 98.1 F (36.7 C) (Oral)   Wt 139 lb  9.6 oz (63.3 kg)   SpO2 94%   BMI 23.23 kg/m   Weight: 139 lb 9.6 oz (63.3 kg)   BP Readings from Last 3 Encounters:  09/05/18 130/60  08/17/18 122/70  08/05/18 120/70   Wt Readings from Last 3 Encounters:  09/05/18 139 lb 9.6 oz (63.3 kg)  08/17/18 141 lb (64 kg)  08/05/18 140 lb 3.2 oz (63.6 kg)    Physical Exam  Constitutional: He appears well-developed and well-nourished. No distress.  HENT:  Right Ear: Hearing, tympanic membrane and ear canal normal.  Left Ear: Hearing, tympanic membrane and ear canal normal.  Mouth/Throat: Uvula is midline, oropharynx is clear and moist and mucous membranes are normal.  Cardiovascular: Normal rate, regular rhythm and normal heart sounds. Exam reveals no friction rub.  No murmur heard. No lower extremity edema  Pulmonary/Chest: Effort normal. No respiratory distress. He has decreased breath sounds (poor effort due to lack of following commands on exam). He has no wheezes. He has no rhonchi. He has no rales.  Abdominal: Soft. Bowel sounds are normal. There is no tenderness. There is no CVA tenderness.  Ventral hernia; nontender, reducible.   Abdomen nontender/not eliciting pain response.  Neurological: He is alert. He has normal strength. Gait normal.  Neuro exam more difficult due to him not following commands, but overall strength is symmetric, gait is slower but without difficulty or walking assistance. No deficits with ROM of extremities.   Psychiatric: He has a normal mood and affect. His behavior is normal. Cognition and memory are normal.  Speech response is slightly slowed and then not appropriate to question. References events from past/historical phrases as response.    Assessment/Plan  1. Confusion Acute in nature more representative of delirium although there is baseline dementia. Prior to completing note I was able to touch base with neurologist dr. Jannifer Franklin to discuss [place] and from our discussion, it seems that in the office  today [name] is not too far from his baseline in terms of speech and conversation, which is reassuring, although his wife did note more abnormal behavior at home last evening. Since leaving office this morning he has been helping with errands and food prep for lunch. Bloodwork is stable and CXR does not show infection although there is pulmonary nodule which we can discuss at future visit. My recommendation is to treat to cover UTI based on symptoms of increased frequency and decreased output until urine culture returns. He has tolerated bactrim in past so I have chosen this medication for treatment. Advised that if any worsening of symptoms/confusion he should proceed to ER. I will forward message to check in on weds with him and set followup appointment. - DG Chest 2 View; Future - TSH; Future - TSH - DG Chest 2 View  2. Urinary frequency See above - CBC with Differential/Platelet; Future - Comprehensive metabolic panel; Future - Culture, Urine - Urinalysis - Comprehensive metabolic panel - CBC with Differential/Platelet  Return pending work up.    Micheline Rough, MD

## 2018-09-06 LAB — URINE CULTURE
MICRO NUMBER:: 91232374
RESULT: NO GROWTH
SPECIMEN QUALITY:: ADEQUATE

## 2018-09-06 NOTE — Telephone Encounter (Signed)
Called, LVM for wife returning her call. It appears Dr. Jannifer Franklin spoke with Dr. Ethlyn Gallery yesterday about this pt as well.

## 2018-09-06 NOTE — Telephone Encounter (Signed)
Wife called back. I relayed that Dr. Jannifer Franklin spoke with Dr. Ethlyn Gallery yesterday. Wife states her husband will not take his medication. She cannot get him to take memantine.  She was notified that bactrim called into their pharmacy for UTI. She is going to go pick this up.  He has a f/u with Dr. Jannifer Franklin on 09/20/18. I offered an earlier 76am appt but this was too early for them. Advised I will send FYI to Dr. Jannifer Franklin. She verbalized understanding and appreciation and will call with any further questions/concerns.

## 2018-09-06 NOTE — Telephone Encounter (Signed)
Pts wife Margaret(on DPR) requesting a call before pts upcoming appt, stating that the pt has recently fighting her to take his medication. Also has an increase of confusion and frustration. Would like to discuss prior(stating its difficult to discuss in front of the pt)

## 2018-09-07 ENCOUNTER — Telehealth: Payer: Self-pay | Admitting: Adult Health

## 2018-09-07 NOTE — Telephone Encounter (Signed)
Copied from [place] [id number]. Topic: Quick Communication - Rx Refill/Question >> Sep 07, 2018  9:14 AM Scherrie Gerlach wrote: Medication: sulfamethoxazole-trimethoprim (BACTRIM DS,SEPTRA DS) 800-160 MG tablet Wife states pt has aphasia and cannot take this pill because it is so large.  And it cannot be cut in half, and she does not know what to do now. She states he needs something very small. She states he had a good day yesterday, and urinating fine.  Pt slept through the night.  No more episodes like the other night. Wife would like a call back to advise on what she should do.

## 2018-09-07 NOTE — Telephone Encounter (Signed)
Wife is aware and patient will not continue the abx

## 2018-09-07 NOTE — Telephone Encounter (Signed)
His urine culture did not show a UTI. He can stop taking the abx

## 2018-09-15 NOTE — Telephone Encounter (Signed)
LMOM. Dr. Jannifer Franklin had a cancellation for 1430 this afternoon, and I called to offer them this appt. If wife calls back and appt. is still available, they are welcome to take it/fim

## 2018-09-15 NOTE — Telephone Encounter (Signed)
Noted/fim 

## 2018-09-15 NOTE — Telephone Encounter (Signed)
Pts wife called stating she is unable to get the pt here today but appreciated the call

## 2018-09-20 ENCOUNTER — Encounter: Payer: Self-pay | Admitting: Neurology

## 2018-09-20 ENCOUNTER — Other Ambulatory Visit: Payer: Self-pay

## 2018-09-20 ENCOUNTER — Ambulatory Visit (INDEPENDENT_AMBULATORY_CARE_PROVIDER_SITE_OTHER): Payer: MEDICARE | Admitting: Neurology

## 2018-09-20 VITALS — BP 155/62 | HR 56 | Resp 20 | Ht 65.0 in | Wt 138.5 lb

## 2018-09-20 DIAGNOSIS — R4701 Aphasia: Secondary | ICD-10-CM | POA: Diagnosis not present

## 2018-09-20 MED ORDER — ESCITALOPRAM OXALATE 5 MG/5ML PO SOLN: 5.0000 mg | Freq: Every day | ORAL | 3 refills | Status: DC

## 2018-09-20 NOTE — Progress Notes (Signed)
Reason for visit: Aphasia, possible memory problem  Jack Noble is an 84 y.o. 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  The patient comes in today as his wife is concerned about some alteration in his cognitive status.  He seems to have more problems with labile affect, he may become agitated off and on, he seems more confused at times.  The has had problems with finding his clothes at times, he will get agitated and want to get in the car and drive or go over to a neighbor's house.  The patient is no longer operating a motor vehicle.  The wife is getting stressed with trying to take care of him.  The patient has had difficulty taking his medications, he often times will refuse to take his pills.  He comes to this office for an evaluation.  Past Medical History:  Diagnosis Date  . Allergy   . Aphasia 12/26/2013   Dr Jannifer Franklin  . Cataracts, bilateral   . Corneal abrasion    Dr. Ned Clines, [facility] Op[facility]  . Dermatophytosis of nail   . Diverticulosis   . Epistaxis   . Glaucoma    Dr. Edilia Bo  . Glaucoma   . Hearing loss   . History of GI diverticular bleed 710/15  . Hyperlipidemia   . Hyperplasia of prostate without urinary obstruction   . Hypertension   . Rosacea    Dr. Marge Duncans  . Sepsis due to Escherichia coli (E. coli) ([place]) 05/2011   Morganella also cultured  . TIA (transient ischemic attack) 9/6-05/2012   BP 221/99    Past Surgical History:  Procedure Laterality Date  . APPENDECTOMY     age 78  . CATARACT EXTRACTION, BILATERAL  2003   Dr Ishmael Holter (now seen @ [facility])  . CHOLECYSTECTOMY  1978   stones  . COLONOSCOPY W/ POLYPECTOMY  06/2009   Dr.Jacobs; "miniscule polyp"  . HERNIA REPAIR    . INGUINAL HERNIA REPAIR     age 89  . SKIN BIOPSY  12/2014   done by dermatologist   . TONSILLECTOMY AND ADENOIDECTOMY    . UPPER GI ENDOSCOPY  05/2014   negative    Family History  Problem Relation Age of Onset  . Stroke Mother 32  . Parkinsonism Brother        Brother died at age at age 2  . Pulmonary embolism Father 54       Post Op  . Diabetes Neg Hx   . Cancer Neg Hx   . COPD Neg Hx   . Heart disease Neg Hx     Social history:  reports that he has never smoked. He has never used smokeless tobacco. He reports that he does not drink alcohol or use drugs.    Allergies  Allergen Reactions  . Amoxicillin     urticaria  . Codeine     Rash Because of a history of documented adverse serious drug reaction;Medi Alert bracelet  is recommended  . Minocycline Hcl     REACTION: nervous/chest pressure (Note: may have affected GE valve)  . Astelin [Azelastine Hcl]     Makes me jittery  . Azelastine Other (See Comments)  . Ether     pneumonia  . Zocor [Simvastatin] Other (See Comments)    Itching w/o, muscle problems and it affected his stomach  . Metoprolol  lightheaded    Medications:  Prior to Admission medications   Medication Sig Start Date End Date Taking? Authorizing Provider  aspirin EC 81 MG tablet Take 81 mg by mouth daily.    Yes [provider]  Cholecalciferol (VITAMIN D) 1000 UNITS capsule Take 1,000 Units by mouth every morning.    Yes [provider]  Elastic Bandages & Supports (ABDOMINAL BINDER/ELASTIC LARGE) MISC Large or appropriate size Large abdominal hernia 08/10/16  Yes Burns, Claudina Lick, MD  finasteride (PROSCAR) 5 MG tablet TAKE 1 TABLET BY MOUTH DAILY 03/29/18  Yes Nafziger, Tommi Rumps, NP  hydrocortisone cream 0.5 % Apply 1 application topically daily as needed for itching.   Yes [provider]  losartan (COZAAR) 50 MG tablet TAKE 1 TABLET BY MOUTH EVERY DAY 12/28/17  Yes Nafziger, Tommi Rumps, NP  nystatin cream (MYCOSTATIN) APPLY TO AFFECTED AREA TWICE A DAY Patient taking differently: Apply 1 application topically 2 (two) times daily as needed for dry skin (rash).  01/10/18  Yes Nafziger,  Tommi Rumps, NP  polyethylene glycol (MIRALAX / GLYCOLAX) packet Take 17 g by mouth daily as needed for moderate constipation.   Yes [provider]  simethicone (MYLICON) 80 MG chewable tablet Chew 1 tablet (80 mg total) by mouth every 6 (six) hours as needed for flatulence. 06/03/14  Yes Buriev, Arie Sabina, MD  sucralfate (CARAFATE) 1 GM/10ML suspension Take 10 mLs (1 g total) by mouth 4 (four) times daily -  with meals and at bedtime. 06/22/18  Yes Nanavati, Ankit, MD  timolol (TIMOPTIC) 0.5 % ophthalmic solution Place 1 drop into both eyes 2 (two) times daily.  01/10/18  Yes [provider]  triamcinolone cream (KENALOG) 0.1 % APPLY TO AFFECTED AREA TWICE A DAY Patient taking differently: Apply 1 application topically 2 (two) times daily as needed (dry skin).  01/10/18  Yes Nafziger, Tommi Rumps, NP  escitalopram (LEXAPRO) 5 MG/5ML solution Take 5 mLs (5 mg total) by mouth daily. 09/20/18   Kathrynn Ducking, MD    ROS:  Out of a complete 14 system review of symptoms, the patient complains only of the following symptoms, and all other reviewed systems are negative.  Memory problems  Blood pressure (!) 155/62, pulse (!) 56, resp. rate 20, height 5\' 5"  (1.651 m), weight 138 lb 8 oz (62.8 kg).  Physical Exam  General: The patient is alert and cooperative at the time of the examination.  Skin: No significant peripheral edema is noted.   Neurologic Exam  Mental status: The patient is alert and cooperative but he is severely aphasic, he has difficulty following verbal commands.   Cranial nerves: Facial symmetry is present. Speech is severely aphasic, not dysarthric. Extraocular movements are full. Visual fields are full.  Motor: The patient has good strength in all 4 extremities.  Sensory examination: Soft touch sensation is symmetric on the face, arms, and legs.  Coordination: The patient has good finger-nose-finger and heel-to-shin bilaterally.  Gait and station: The patient has  a relatively normal gait, good balance.  Reflexes: Deep tendon reflexes are symmetric.   Assessment/Plan:  1.  Possible memory disorder  2.  Aphasia  It is difficult to ascertain this patient's cognitive status as he is so aphasic that it is difficult to know what his underlying cognitive capabilities are.  He clearly can understand nonverbal cues and he is able to follow commands well.  The wife indicates that he is losing the ability to perform some activities of daily living that he used  to be able to do.  He is having more problems with mood swings.  For this reason, we will start low-dose Lexapro starting on 5 mg daily.  He will follow-up in 6 months, she will call for any dose adjustments.  I have recommended the [place] to help give the wife a break during the day.  Jill Alexanders MD 09/20/2018 3:09 PM  [facility] [address] [place],  [address]  Phone [phone] Fax [phone]

## 2018-09-26 DIAGNOSIS — H04123 Dry eye syndrome of bilateral lacrimal glands: Secondary | ICD-10-CM | POA: Diagnosis not present

## 2018-09-26 DIAGNOSIS — H40051 Ocular hypertension, right eye: Secondary | ICD-10-CM | POA: Diagnosis not present

## 2018-09-26 DIAGNOSIS — H4052X1 Glaucoma secondary to other eye disorders, left eye, mild stage: Secondary | ICD-10-CM | POA: Diagnosis not present

## 2018-09-26 DIAGNOSIS — H18453 Nodular corneal degeneration, bilateral: Secondary | ICD-10-CM | POA: Diagnosis not present

## 2018-10-05 ENCOUNTER — Ambulatory Visit (INDEPENDENT_AMBULATORY_CARE_PROVIDER_SITE_OTHER): Payer: MEDICARE | Admitting: Family Medicine

## 2018-10-05 ENCOUNTER — Ambulatory Visit (INDEPENDENT_AMBULATORY_CARE_PROVIDER_SITE_OTHER): Payer: MEDICARE

## 2018-10-05 ENCOUNTER — Encounter: Payer: Self-pay | Admitting: Family Medicine

## 2018-10-05 VITALS — BP 116/62 | HR 50 | Temp 98.0°F | Wt 138.4 lb

## 2018-10-05 DIAGNOSIS — S60221A Contusion of right hand, initial encounter: Secondary | ICD-10-CM | POA: Diagnosis not present

## 2018-10-05 DIAGNOSIS — M79641 Pain in right hand: Secondary | ICD-10-CM | POA: Diagnosis not present

## 2018-10-05 NOTE — Progress Notes (Signed)
   Subjective:    Patient ID: Jack Noble, male    DOB: 09-17-1929, 84 y.o.   MRN: [id number]  HPI Here for an injury to the right hand which occurred at home yesterday. The toilet lid fell on his hand, and he has some slight pain in the area today. He has done nothing for it.    Review of Systems  Constitutional: Negative.   Respiratory: Negative.   Cardiovascular: Negative.   Musculoskeletal: Positive for arthralgias.       Objective:   Physical Exam  Constitutional: He appears well-developed and well-nourished.  Cardiovascular: Normal rate, regular rhythm, normal heart sounds and intact distal pulses.  Pulmonary/Chest: Effort normal and breath sounds normal.  Musculoskeletal:  The right hand has a small ecchymosis at the base of the thumb. This are is mildly tender but there is no swelling. No crepitus, full ROM   The Xrays show severe osteoarthritis but no fractures.         Assessment & Plan:  Hand contusion. This should resolve in a few days. Recheck prn. Alysia Penna, MD

## 2018-10-07 ENCOUNTER — Telehealth: Payer: Self-pay | Admitting: Neurology

## 2018-10-07 MED ORDER — ALPRAZOLAM 0.25 MG PO TABS: 0.2500 mg | ORAL_TABLET | Freq: Three times a day (TID) | ORAL | 0 refills | Status: DC | PRN

## 2018-10-07 MED ORDER — ESCITALOPRAM OXALATE 5 MG PO TABS: 5.0000 mg | ORAL_TABLET | Freq: Every day | ORAL | 2 refills | Status: DC

## 2018-10-07 NOTE — Telephone Encounter (Signed)
I called the wife.  The patient apparently has had some irritability today, she is concerned about this.  He is not taking his Lexapro, she wants the medication converted to a tablet.  I will send in a prescription for the 5 mg tablets, will give low-dose alprazolam 0.25 mg to give to him if needed for agitation.  I do not believe that the patient needs to go to the emergency room at the moment.

## 2018-10-25 ENCOUNTER — Other Ambulatory Visit: Payer: Self-pay | Admitting: Adult Health

## 2018-10-26 NOTE — Telephone Encounter (Signed)
No CPX on file.  Has had recent lab work.

## 2018-10-29 ENCOUNTER — Other Ambulatory Visit: Payer: Self-pay | Admitting: Neurology

## 2018-11-28 NOTE — Progress Notes (Signed)
[facility] [place], [place] [address] Phone: [phone] Subjective:    I Jack Noble am serving as a Education administrator for Dr. Hulan Saas.     CC: Lower back pain  ERD:[id number]  Jack Noble is a 85 y.o. male coming in with complaint of lower back and hip pain. States that he is not hurting today. Fell walking up the stairs at his home.   Onset- Early December Fell directly backwards.  Had pain initially.  Patient was able to get up.  Patient has had some discomfort from time to time per patient's primary caregiver which is his wife.  Patient does have significant dementia.  Seems to be walking okay.     Past Medical History:  Diagnosis Date  . Allergy   . Aphasia 12/26/2013   Dr Jannifer Franklin  . Cataracts, bilateral   . Corneal abrasion    Dr. Ned Clines, [facility]  . Dermatophytosis of nail   . Diverticulosis   . Epistaxis   . Glaucoma    Dr. Edilia Bo  . Glaucoma   . Hearing loss   . History of GI diverticular bleed 710/15  . Hyperlipidemia   . Hyperplasia of prostate without urinary obstruction   . Hypertension   . Rosacea    Dr. Marge Duncans  . Sepsis due to Escherichia coli (E. coli) ([place]) 05/2011   Morganella also cultured  . TIA (transient ischemic attack) 9/6-05/2012   BP 221/99   Past Surgical History:  Procedure Laterality Date  . APPENDECTOMY     age 60  . CATARACT EXTRACTION, BILATERAL  2003   Dr Ishmael Holter (now seen @ [place])  . CHOLECYSTECTOMY  1978   stones  . COLONOSCOPY W/ POLYPECTOMY  06/2009   Dr.Jacobs; "miniscule polyp"  . HERNIA REPAIR    . INGUINAL HERNIA REPAIR     age 71  . SKIN BIOPSY  12/2014   done by dermatologist   . TONSILLECTOMY AND ADENOIDECTOMY    . UPPER GI ENDOSCOPY  05/2014   negative   Social History   Socioeconomic History  . Marital status: Married    Spouse name: Not on file  . Number of children: 1  . Years of education: college  . Highest education level: Not on file    Occupational History  . Occupation: [employer]    Employer: RETIRED  Social Needs  . Financial resource strain: Not on file  . Food insecurity:    Worry: Not on file    Inability: Not on file  . Transportation needs:    Medical: Not on file    Non-medical: Not on file  Tobacco Use  . Smoking status: Never Smoker  . Smokeless tobacco: Never Used  Substance and Sexual Activity  . Alcohol use: No  . Drug use: No  . Sexual activity: Not on file  Lifestyle  . Physical activity:    Days per week: Not on file    Minutes per session: Not on file  . Stress: Not on file  Relationships  . Social connections:    Talks on phone: Not on file    Gets together: Not on file    Attends religious service: Not on file    Active member of club or organization: Not on file    Attends meetings of clubs or organizations: Not on file    Relationship status: Not on file  Other Topics Concern  . Not on file  Social History  Narrative   Lives with his wife.     Was in the [employer] for 15 years   Left handed   Allergies  Allergen Reactions  . Amoxicillin     urticaria  . Codeine     Rash Because of a history of documented adverse serious drug reaction;Medi Alert bracelet  is recommended  . Minocycline Hcl     REACTION: nervous/chest pressure (Note: may have affected GE valve)  . Astelin [Azelastine Hcl]     Makes me jittery  . Azelastine Other (See Comments)  . Ether     pneumonia  . Zocor [Simvastatin] Other (See Comments)    Itching w/o, muscle problems and it affected his stomach  . Metoprolol     lightheaded   Family History  Problem Relation Age of Onset  . Stroke Mother 41  . Parkinsonism Brother        Brother died at age at age 40  . Pulmonary embolism Father 12       Post Op  . Diabetes Neg Hx   . Cancer Neg Hx   . COPD Neg Hx   . Heart disease Neg Hx      Current Outpatient Medications (Cardiovascular):  .  losartan (COZAAR) 50 MG tablet, TAKE 1 TABLET  BY MOUTH EVERY DAY   Current Outpatient Medications (Analgesics):  .  aspirin EC 81 MG tablet, Take 81 mg by mouth daily.    Current Outpatient Medications (Other):  Marland Kitchen  ALPRAZolam (XANAX) 0.25 MG tablet, Take 1 tablet (0.25 mg total) by mouth 3 (three) times daily as needed for anxiety. .  Cholecalciferol (VITAMIN D) 1000 UNITS capsule, Take 1,000 Units by mouth every morning.  Hospital doctor Bandages & Supports (ABDOMINAL BINDER/ELASTIC LARGE) MISC, Large or appropriate size Large abdominal hernia .  escitalopram (LEXAPRO) 5 MG tablet, TAKE 1 TABLET BY MOUTH EVERY DAY .  finasteride (PROSCAR) 5 MG tablet, TAKE 1 TABLET BY MOUTH DAILY .  hydrocortisone cream 0.5 %, Apply 1 application topically daily as needed for itching. .  nystatin cream (MYCOSTATIN), APPLY TO AFFECTED AREA TWICE A DAY (Patient taking differently: Apply 1 application topically 2 (two) times daily as needed for dry skin (rash). ) .  polyethylene glycol (MIRALAX / GLYCOLAX) packet, Take 17 g by mouth daily as needed for moderate constipation. .  simethicone (MYLICON) 80 MG chewable tablet, Chew 1 tablet (80 mg total) by mouth every 6 (six) hours as needed for flatulence. .  sucralfate (CARAFATE) 1 GM/10ML suspension, Take 10 mLs (1 g total) by mouth 4 (four) times daily -  with meals and at bedtime. .  timolol (TIMOPTIC) 0.5 % ophthalmic solution, Place 1 drop into both eyes 2 (two) times daily.  Marland Kitchen  triamcinolone cream (KENALOG) 0.1 %, APPLY TO AFFECTED AREA TWICE A DAY (Patient taking differently: Apply 1 application topically 2 (two) times daily as needed (dry skin). )    Past medical history, social, surgical and family history all reviewed in electronic medical record.  No pertanent information unless stated regarding to the chief complaint.   Review of Systems:   Patient has significant dementia which makes getting review of systems challenging patient denies though any dizziness, radiation of pain down the leg, numbness,  any bowel or bladder incontinence any recent headaches  Objective  Blood pressure 110/70, pulse (!) 58, height '5\' 5"'  (1.651 m), weight 138 lb (62.6 kg), SpO2 98 %.     General: No apparent distress significant dementia but  very pleasant HEENT: Pupils equal, extraocular movements intact  Respiratory: Patient's speak in full sentences and does not appear short of breath  Cardiovascular: No lower extremity edema, non tender, no erythema  Skin: Warm dry intact with no signs of infection or rash on extremities or on axial skeleton.  Abdomen: Soft nontender  Neuro: Cranial nerves II through XII are intact, neurovascularly intact in all extremities with 2+ DTRs and 2+ pulses.  Lymph: No lymphadenopathy of posterior or anterior cervical chain or axillae bilaterally.  Gait slow shuffling gait MSK:  tender with limited range of motion  stability and strength and tone of shoulders, elbows, wrist, hip, knee and ankles bilaterally.  Arthritic changes noted  Back exam shows loss of lordosis.  Patient does have some mild degenerative scoliosis.  Patient does have atrophy of the lower extremities bilaterally.  Patient has 4 out of 5 strength.  Neurovascular intact distally.  No significant back pain to palpation.  No bruising noted on any of the back.  Patient's legs unremarkable as well.    Impression and Recommendations:      The above documentation has been reviewed and is accurate and complete Lyndal Pulley, DO       Note: This dictation was prepared with Dragon dictation along with smaller phrase technology. Any transcriptional errors that result from this process are unintentional.

## 2018-11-29 ENCOUNTER — Encounter: Payer: Self-pay | Admitting: Family Medicine

## 2018-11-29 ENCOUNTER — Ambulatory Visit (INDEPENDENT_AMBULATORY_CARE_PROVIDER_SITE_OTHER): Payer: MEDICARE | Admitting: Family Medicine

## 2018-11-29 DIAGNOSIS — Z9181 History of falling: Secondary | ICD-10-CM | POA: Diagnosis not present

## 2018-11-29 NOTE — Assessment & Plan Note (Signed)
Recent fall with possible some back injury.  Patient is doing remarkably well.  Discussed icing regimen and home exercise.  Discussed which activities to do which wants to avoid.  Patient is to increase activity slowly over the course the next several days.  Discussed with patient's caregiver at this time to monitor.  Patient seems to be doing all right.  Likely does have underlying spinal stenosis but I do not think it is any worsening at this moment.  patient seems to do well follow-up as needed

## 2018-11-29 NOTE — Patient Instructions (Signed)
God to see you  Jack Noble is your friend Ice 10 minutes at night Stay active pennsaid pinkie amount topically 2 times daily as needed.  See me when you need me

## 2018-11-30 ENCOUNTER — Emergency Department (HOSPITAL_COMMUNITY): Payer: No Typology Code available for payment source

## 2018-11-30 ENCOUNTER — Other Ambulatory Visit: Payer: Self-pay

## 2018-11-30 ENCOUNTER — Encounter (HOSPITAL_COMMUNITY): Payer: Self-pay | Admitting: Emergency Medicine

## 2018-11-30 ENCOUNTER — Emergency Department (HOSPITAL_COMMUNITY)
Admission: EM | Admit: 2018-11-30 | Discharge: 2018-11-30 | Disposition: A | Discharge status: Home / Self Care | Payer: No Typology Code available for payment source | Attending: Emergency Medicine | Admitting: Emergency Medicine

## 2018-11-30 DIAGNOSIS — S3991XA Unspecified injury of abdomen, initial encounter: Secondary | ICD-10-CM | POA: Diagnosis not present

## 2018-11-30 DIAGNOSIS — Y9389 Activity, other specified: Secondary | ICD-10-CM | POA: Diagnosis not present

## 2018-11-30 DIAGNOSIS — Z79899 Other long term (current) drug therapy: Secondary | ICD-10-CM | POA: Insufficient documentation

## 2018-11-30 DIAGNOSIS — Y92410 Unspecified street and highway as the place of occurrence of the external cause: Secondary | ICD-10-CM | POA: Diagnosis not present

## 2018-11-30 DIAGNOSIS — Y999 Unspecified external cause status: Secondary | ICD-10-CM | POA: Insufficient documentation

## 2018-11-30 DIAGNOSIS — I10 Essential (primary) hypertension: Secondary | ICD-10-CM | POA: Insufficient documentation

## 2018-11-30 DIAGNOSIS — Z7982 Long term (current) use of aspirin: Secondary | ICD-10-CM | POA: Diagnosis not present

## 2018-11-30 DIAGNOSIS — S0990XA Unspecified injury of head, initial encounter: Secondary | ICD-10-CM | POA: Diagnosis not present

## 2018-11-30 DIAGNOSIS — S20211A Contusion of right front wall of thorax, initial encounter: Secondary | ICD-10-CM

## 2018-11-30 DIAGNOSIS — S299XXA Unspecified injury of thorax, initial encounter: Secondary | ICD-10-CM | POA: Diagnosis not present

## 2018-11-30 DIAGNOSIS — R079 Chest pain, unspecified: Secondary | ICD-10-CM | POA: Diagnosis not present

## 2018-11-30 LAB — CBC WITH DIFFERENTIAL/PLATELET
Abs Immature Granulocytes: 0.03 10*3/uL (ref 0.00–0.07)
Basophils Absolute: 0.1 10*3/uL (ref 0.0–0.1)
Basophils Relative: 1 %
Eosinophils Absolute: 0.1 10*3/uL (ref 0.0–0.5)
Eosinophils Relative: 2 %
HCT: 39.7 % (ref 39.0–52.0)
Hemoglobin: 12.9 g/dL — ABNORMAL LOW (ref 13.0–17.0)
IMMATURE GRANULOCYTES: 1 %
Lymphocytes Relative: 28 %
Lymphs Abs: 1.8 10*3/uL (ref 0.7–4.0)
MCH: 32.4 pg (ref 26.0–34.0)
MCHC: 32.5 g/dL (ref 30.0–36.0)
MCV: 99.7 fL (ref 80.0–100.0)
Monocytes Absolute: 0.4 10*3/uL (ref 0.1–1.0)
Monocytes Relative: 7 %
Neutro Abs: 4 10*3/uL (ref 1.7–7.7)
Neutrophils Relative %: 61 %
Platelets: 244 10*3/uL (ref 150–400)
RBC: 3.98 MIL/uL — ABNORMAL LOW (ref 4.22–5.81)
RDW: 12.8 % (ref 11.5–15.5)
WBC: 6.5 10*3/uL (ref 4.0–10.5)
nRBC: 0 % (ref 0.0–0.2)

## 2018-11-30 LAB — I-STAT CHEM 8, ED
BUN: 13 mg/dL (ref 8–23)
Calcium, Ion: 1.24 mmol/L (ref 1.15–1.40)
Chloride: 105 mmol/L (ref 98–111)
Creatinine, Ser: 0.6 mg/dL — ABNORMAL LOW (ref 0.61–1.24)
Glucose, Bld: 102 mg/dL — ABNORMAL HIGH (ref 70–99)
HCT: 39 % (ref 39.0–52.0)
Hemoglobin: 13.3 g/dL (ref 13.0–17.0)
POTASSIUM: 3.8 mmol/L (ref 3.5–5.1)
Sodium: 140 mmol/L (ref 135–145)
TCO2: 27 mmol/L (ref 22–32)

## 2018-11-30 MED ORDER — SODIUM CHLORIDE (PF) 0.9 % IJ SOLN
INTRAMUSCULAR | Status: AC
  Filled 2018-11-30: qty 50

## 2018-11-30 MED ORDER — IOPAMIDOL (ISOVUE-300) INJECTION 61%
INTRAVENOUS | Status: AC
  Filled 2018-11-30: qty 100

## 2018-11-30 MED ORDER — IOPAMIDOL (ISOVUE-300) INJECTION 61%
100.0000 mL | Freq: Once | INTRAVENOUS | Status: AC | PRN
  Administered 2018-11-30: 100 mL via INTRAVENOUS

## 2018-11-30 NOTE — Discharge Instructions (Addendum)
There is also a lung nodule on your CT.  It can be followed up as an outpatient.
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
## 2018-11-30 NOTE — ED Notes (Signed)
Bed: [facility] Expected date:  Expected time:  Means of arrival:  Comments: 
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
## 2018-11-30 NOTE — ED Notes (Signed)
Pt and family verbalized discharge instructions and follow up care. Assisted out using wheelchair.

## 2018-11-30 NOTE — ED Notes (Signed)
Patient transported to CT 

## 2018-12-06 ENCOUNTER — Ambulatory Visit: Payer: MEDICARE | Admitting: Adult Health

## 2018-12-06 ENCOUNTER — Ambulatory Visit: Payer: Self-pay | Admitting: *Deleted

## 2018-12-06 NOTE — Telephone Encounter (Signed)
Noted. Will send to [facility] as FYI. Pt will call back if appt needed.
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
## 2018-12-06 NOTE — Telephone Encounter (Signed)
Noted  

## 2018-12-16 ENCOUNTER — Ambulatory Visit: Payer: MEDICARE | Admitting: Adult Health

## 2019-01-03 ENCOUNTER — Encounter: Payer: Self-pay | Admitting: Adult Health

## 2019-01-03 ENCOUNTER — Ambulatory Visit (INDEPENDENT_AMBULATORY_CARE_PROVIDER_SITE_OTHER): Payer: MEDICARE | Admitting: Adult Health

## 2019-01-03 VITALS — BP 116/60 | Temp 97.8°F | Wt 136.0 lb

## 2019-01-03 DIAGNOSIS — R4701 Aphasia: Secondary | ICD-10-CM | POA: Diagnosis not present

## 2019-01-03 DIAGNOSIS — R410 Disorientation, unspecified: Secondary | ICD-10-CM | POA: Diagnosis not present

## 2019-01-03 NOTE — Progress Notes (Signed)
Subjective:    Patient ID: Jack Noble, male    DOB: March 22, 1929, 85 y.o.   MRN: [id number]  HPI 85 year old male who  has a past medical history of Allergy, Aphasia (12/26/2013), Cataracts, bilateral, Corneal abrasion, Dermatophytosis of nail, Diverticulosis, Epistaxis, Glaucoma, Glaucoma, Hearing loss, History of GI diverticular bleed (710/15), Hyperlipidemia, Hyperplasia of prostate without urinary obstruction, Hypertension, Rosacea, Sepsis due to Escherichia coli (E. coli) ([place]) (05/2011), and TIA (transient ischemic attack) (9/6-05/2012).  He presents to the office today with his wife. They will be moving into an independent living facility at [facility] and needs paperwork filled out.   His wife would also like to have a urine sample checked for UTI. His wife feels as though his confusion is becoming worse, especially at night where he becomes more confused and agitated with his wife. Denies UTI like symptoms They have an appointment with Neurology in April    Review of Systems See HPI   Past Medical History:  Diagnosis Date  . Allergy   . Aphasia 12/26/2013   Dr Jannifer Franklin  . Cataracts, bilateral   . Corneal abrasion    Dr. Ned Clines, [facility]  . Dermatophytosis of nail   . Diverticulosis   . Epistaxis   . Glaucoma    Dr. Edilia Bo  . Glaucoma   . Hearing loss   . History of GI diverticular bleed 710/15  . Hyperlipidemia   . Hyperplasia of prostate without urinary obstruction   . Hypertension   . Rosacea    Dr. Marge Duncans  . Sepsis due to Escherichia coli (E. coli) ([place]) 05/2011   Morganella also cultured  . TIA (transient ischemic attack) 9/6-05/2012   BP 221/99    Social History   Socioeconomic History  . Marital status: Married    Spouse name: Not on file  . Number of children: 1  . Years of education: college  . Highest education level: Not on file  Occupational History  . Occupation: [employer]    Employer: RETIRED  Social Needs  . Financial resource  strain: Not on file  . Food insecurity:    Worry: Not on file    Inability: Not on file  . Transportation needs:    Medical: Not on file    Non-medical: Not on file  Tobacco Use  . Smoking status: Never Smoker  . Smokeless tobacco: Never Used  Substance and Sexual Activity  . Alcohol use: No  . Drug use: No  . Sexual activity: Not on file  Lifestyle  . Physical activity:    Days per week: Not on file    Minutes per session: Not on file  . Stress: Not on file  Relationships  . Social connections:    Talks on phone: Not on file    Gets together: Not on file    Attends religious service: Not on file    Active member of club or organization: Not on file    Attends meetings of clubs or organizations: Not on file    Relationship status: Not on file  . Intimate partner violence:    Fear of current or ex partner: Not on file    Emotionally abused: Not on file    Physically abused: Not on file    Forced sexual activity: Not on file  Other Topics Concern  . Not on file  Social History Narrative   Lives with his wife.     Was in the [employer] for 15  years   Left handed    Past Surgical History:  Procedure Laterality Date  . APPENDECTOMY     age 89  . CATARACT EXTRACTION, BILATERAL  2003   Dr Ishmael Holter (now seen @ [place])  . CHOLECYSTECTOMY  1978   stones  . COLONOSCOPY W/ POLYPECTOMY  06/2009   Dr.Jacobs; "miniscule polyp"  . HERNIA REPAIR    . INGUINAL HERNIA REPAIR     age 69  . SKIN BIOPSY  12/2014   done by dermatologist   . TONSILLECTOMY AND ADENOIDECTOMY    . UPPER GI ENDOSCOPY  05/2014   negative    Family History  Problem Relation Age of Onset  . Stroke Mother 30  . Parkinsonism Brother        Brother died at age at age 50  . Pulmonary embolism Father 30       Post Op  . Diabetes Neg Hx   . Cancer Neg Hx   . COPD Neg Hx   . Heart disease Neg Hx     Allergies  Allergen Reactions  . Amoxicillin     Urticaria DID THE REACTION INVOLVE: Swelling of  the face/tongue/throat, SOB, or low BP? Yes Sudden or severe rash/hives, skin peeling, or the inside of the mouth or nose? No Did it require medical treatment? No When did it last happen?2-3 years ago If all above answers are "NO", may proceed with cephalosporin use.  . Codeine     Rash Because of a history of documented adverse serious drug reaction;Medi Alert bracelet  is recommended  . Minocycline Hcl     REACTION: nervous/chest pressure (Note: may have affected GE valve)  . Astelin [Azelastine Hcl]     Makes pt jittery  . Azelastine Other (See Comments)    Makes pt jittery  . Ether     pneumonia  . Zocor [Simvastatin] Other (See Comments)    Itching w/o, muscle problems and it affected his stomach  . Metoprolol     lightheaded    Current Outpatient Medications on File Prior to Visit  Medication Sig Dispense Refill  . acetaminophen (TYLENOL) 500 MG tablet Take 500 mg by mouth every 6 (six) hours as needed for mild pain.    Marland Kitchen ALPRAZolam (XANAX) 0.25 MG tablet Take 1 tablet (0.25 mg total) by mouth 3 (three) times daily as needed for anxiety. 30 tablet 0  . aspirin EC 81 MG tablet Take 81 mg by mouth daily.     . Cholecalciferol (VITAMIN D) 1000 UNITS capsule Take 1,000 Units by mouth every morning.     Water engineer Bandages & Supports (ABDOMINAL BINDER/ELASTIC LARGE) MISC Large or appropriate size Large abdominal hernia 1 each 0  . escitalopram (LEXAPRO) 5 MG tablet TAKE 1 TABLET BY MOUTH EVERY DAY 90 tablet 1  . finasteride (PROSCAR) 5 MG tablet TAKE 1 TABLET BY MOUTH DAILY (Patient taking differently: Take 5 mg by mouth at bedtime. ) 90 tablet 3  . hydrocortisone cream 0.5 % Apply 1 application topically daily as needed for itching.    . losartan (COZAAR) 50 MG tablet TAKE 1 TABLET BY MOUTH EVERY DAY (Patient taking differently: Take 50 mg by mouth daily. ) 90 tablet 3  . nystatin cream (MYCOSTATIN) APPLY TO AFFECTED AREA TWICE A DAY (Patient taking differently: Apply 1  application topically 2 (two) times daily as needed for dry skin (rash). ) 30 g 0  . polyethylene glycol (MIRALAX / GLYCOLAX) packet Take 17 g by mouth  daily as needed for moderate constipation.    . simethicone (MYLICON) 80 MG chewable tablet Chew 1 tablet (80 mg total) by mouth every 6 (six) hours as needed for flatulence. 30 tablet 0  . sucralfate (CARAFATE) 1 GM/10ML suspension Take 10 mLs (1 g total) by mouth 4 (four) times daily -  with meals and at bedtime. (Patient taking differently: Take 1 g by mouth 4 (four) times daily as needed (gas). ) 420 mL 0  . timolol (TIMOPTIC) 0.5 % ophthalmic solution Place 1 drop into both eyes 2 (two) times daily.     Marland Kitchen triamcinolone cream (KENALOG) 0.1 % APPLY TO AFFECTED AREA TWICE A DAY 30 g 0   No current facility-administered medications on file prior to visit.     BP 116/60   Temp 97.8 F (36.6 C)   Wt 136 lb (61.7 kg)   BMI 23.34 kg/m       Objective:   Physical Exam Vitals signs and nursing note reviewed.  Constitutional:      Appearance: Normal appearance.  Cardiovascular:     Rate and Rhythm: Normal rate and regular rhythm.     Pulses: Normal pulses.     Heart sounds: Normal heart sounds.  Pulmonary:     Effort: Pulmonary effort is normal.     Breath sounds: Normal breath sounds.  Skin:    General: Skin is dry.  Neurological:     Mental Status: He is alert.     Comments: Aphasic  Has difficulty following commands   Psychiatric:        Attention and Perception: Attention normal.        Mood and Affect: Mood normal.        Cognition and Memory: Cognition is impaired.        Judgment: Judgment normal.       Assessment & Plan:  He does seem slightly more confused than normal.   Will check POC UA for UTI but likely worsening symptoms. Advised his wife that she can trial giving melatonin 5 mg prior to bed.   MOST form filled out Will fill out paperwork for independent living facility and fax  Follow up as needed  [name], NP

## 2019-01-06 ENCOUNTER — Encounter: Payer: Self-pay | Admitting: Family Medicine

## 2019-01-06 ENCOUNTER — Ambulatory Visit (INDEPENDENT_AMBULATORY_CARE_PROVIDER_SITE_OTHER): Payer: MEDICARE | Admitting: Family Medicine

## 2019-01-06 VITALS — BP 128/74 | HR 53 | Temp 97.7°F | Wt 138.1 lb

## 2019-01-06 DIAGNOSIS — K4090 Unilateral inguinal hernia, without obstruction or gangrene, not specified as recurrent: Secondary | ICD-10-CM | POA: Diagnosis not present

## 2019-01-06 NOTE — Progress Notes (Signed)
   Subjective:    Patient ID: Jack Noble, male    DOB: December 02, 1928, 85 y.o.   MRN: [id number]  HPI Here with his wife for the appearance yesterday of a painful lump in the right groin. He was in a lot of pain last night, but he seems more comfortable today. His wife noticed a lump yesterday and it seems to be bigger today. No fever. He has had a large asymptomatic ventral  hernia for quite a while.    Review of Systems  Constitutional: Negative.   Respiratory: Negative.   Cardiovascular: Negative.   Gastrointestinal: Negative.   Genitourinary: Negative for difficulty urinating, dysuria, scrotal swelling and testicular pain.       Objective:   Physical Exam Constitutional:      General: He is not in acute distress.    Appearance: Normal appearance.  Cardiovascular:     Rate and Rhythm: Normal rate and regular rhythm.     Pulses: Normal pulses.     Heart sounds: Normal heart sounds.  Pulmonary:     Effort: Pulmonary effort is normal.     Breath sounds: Normal breath sounds.  Abdominal:     General: Abdomen is flat. Bowel sounds are normal. There is no distension.     Palpations: Abdomen is soft.     Tenderness: There is no abdominal tenderness. There is no guarding.     Comments: There is a large tense non-reducible right direct inguinal hernia. This is not tender. He also has a large nontender incisional hernia in the central abdomen            Assessment & Plan:  Inguinal hernia. We will refer him to Surgery to evaluate.  Alysia Penna, MD

## 2019-01-09 ENCOUNTER — Telehealth: Payer: Self-pay | Admitting: Neurology

## 2019-01-09 NOTE — Telephone Encounter (Signed)
I tried to call the patient, unable to leave a message, I will try to call back later.   I called the patient back, the wife is looking into friend's home, they have already contacted them, they are meeting with him this week.  She is not sure whether they can afford this or not, the may have to hire an assistant to come into the house to help out which may be less expensive.

## 2019-01-09 NOTE — Telephone Encounter (Signed)
Pt's wife called stating that her husband has progressively gotten worse. He is not recognizing her, he is getting irritated at her more he will refuse to take his medicines at times or to go to bed. She is wondering if this has to do with his Aphasia. She is not able to take care of him at home any longer and is looking for advise if she should get him referred to a assistant living or something of the sort. Please advise.

## 2019-01-20 ENCOUNTER — Telehealth: Payer: Self-pay | Admitting: *Deleted

## 2019-01-20 NOTE — Telephone Encounter (Signed)
I am not overtly concerned about him having a UTI. They can forgo the urine sample

## 2019-01-20 NOTE — Telephone Encounter (Signed)
Spoke with patient wife - aware of recommendations. She will call if she starts to have sx's again and will also go ahead and bring in a sample. Nothing further needed.

## 2019-01-20 NOTE — Telephone Encounter (Signed)
Will send to PCP to make aware that they are having difficulty getting the sample.  Pt has been trying since 01/05/2019 to get a good urine sample, still unable.  ATC patient to see if having urinary sx's at this time. No answer

## 2019-01-20 NOTE — Telephone Encounter (Signed)
Copied from [place] [id number]. Topic: General - Other >> Jan 05, 2019 12:09 PM Leward Quan A wrote: Reason for CRM: Patient wife called to say that she have not been able to get the specimen requested but as soon as she can she will collect it and drop it off at the lab. Please be aware. Any questions please call patient Thank you >> Jan 05, 2019 12:22 PM Virl Cagey, CMA wrote: Juluis Rainier there may be a delay in getting results back as they are still trying to obtain the sample.  >> Jan 05, 2019 12:47 PM Miles Costain T, CMA wrote: Noted >> Jan 20, 2019 11:27 AM Bea Graff, NT wrote: Pts wife states she is still having a hard time with her husband to get a urine sample and wants to know if she should not worry about it at this point. Please advise. May leave message.

## 2019-01-30 ENCOUNTER — Ambulatory Visit: Payer: Self-pay | Admitting: *Deleted

## 2019-01-30 NOTE — Telephone Encounter (Signed)
Summary: Frequent urination    Pt wife called and stated that pt has frequent urination in the middle of the night and would like a call back from the nurse. Pts wife states that this was a normal amount or urination every time. Please advise      Patient's wife is calling to report her husband was up every 30-45 minutes last night to urinate. She states he had steady steam and good amount each time. She states they have not changed the amount of fluid intake. She is very afraid of UTI - appointment made for evaluation of patient for new nocturia symptoms. Reason for Disposition . Urinating more frequently than usual (i.e., frequency)  Answer Assessment - Initial Assessment Questions 1. SYMPTOM: "What's the main symptom you're concerned about?" (e.g., frequency, incontinence)     Nocturia-11-5 pm - every 30-40 minutes 2. ONSET: "When did the  nocturia  start?"     Last night 3. PAIN: "Is there any pain?" If so, ask: "How bad is it?" (Scale: 1-10; mild, moderate, severe)     No pain reported 4. CAUSE: "What do you think is causing the symptoms?"     No other symptoms 5. OTHER SYMPTOMS: "Do you have any other symptoms?" (e.g., fever, flank pain, blood in urine, pain with urination)     no 6. PREGNANCY: "Is there any chance you are pregnant?" "When was your last menstrual period?"     n/a  Protocols used: URINARY [facility]

## 2019-01-31 ENCOUNTER — Ambulatory Visit (INDEPENDENT_AMBULATORY_CARE_PROVIDER_SITE_OTHER): Payer: MEDICARE | Admitting: Family Medicine

## 2019-01-31 ENCOUNTER — Encounter: Payer: Self-pay | Admitting: Family Medicine

## 2019-01-31 VITALS — BP 140/72 | HR 60 | Temp 97.7°F | Wt 138.4 lb

## 2019-01-31 DIAGNOSIS — R351 Nocturia: Secondary | ICD-10-CM | POA: Diagnosis not present

## 2019-01-31 LAB — POCT URINALYSIS DIPSTICK
BILIRUBIN UA: NEGATIVE
Blood, UA: NEGATIVE
Glucose, UA: NEGATIVE
KETONES UA: NEGATIVE
Leukocytes, UA: NEGATIVE
Nitrite, UA: NEGATIVE
Protein, UA: POSITIVE — AB
Spec Grav, UA: 1.015 (ref 1.010–1.025)
Urobilinogen, UA: 1 E.U./dL
pH, UA: 7.5 (ref 5.0–8.0)

## 2019-01-31 NOTE — Progress Notes (Signed)
   Subjective:    Patient ID: Jack Noble, male    DOB: 1928/12/26, 85 y.o.   MRN: [id number]  HPI Here with his wife to check his urine. 2 nights ago she says he was up urinating frequently all that night, though he denied any pain or burning. He did not have a fever. Since then he has been urinating normally with no complaints.    Review of Systems  Constitutional: Negative.   Respiratory: Negative.   Cardiovascular: Negative.   Gastrointestinal: Negative.   Genitourinary: Positive for frequency. Negative for dysuria, flank pain, hematuria and urgency.       Objective:   Physical Exam Constitutional:      General: He is not in acute distress.    Appearance: Normal appearance.  Cardiovascular:     Rate and Rhythm: Normal rate and regular rhythm.     Pulses: Normal pulses.     Heart sounds: Normal heart sounds.  Pulmonary:     Effort: Pulmonary effort is normal.     Breath sounds: Normal breath sounds.  Abdominal:     General: Abdomen is flat. Bowel sounds are normal. There is no distension.     Palpations: Abdomen is soft. There is no mass.     Tenderness: There is no abdominal tenderness. There is no guarding or rebound.     Hernia: No hernia is present.  Neurological:     Mental Status: He is alert.           Assessment & Plan:  He had one night of polyuria but none since. His UA today is clear. His wife will monitor this and let us know if it returns.  Alysia Penna, MD

## 2019-03-15 ENCOUNTER — Telehealth: Payer: Self-pay | Admitting: Neurology

## 2019-03-15 NOTE — Telephone Encounter (Signed)
I called the patient, talk with the wife.  The patient wants to drive a car, he has not been driving since well before the last visit in October 2019.  There is some concern about his memory that he may be getting demented.  He has a severe aphasia following an encephalitis.  I tried to talk with the patient, indicated that he should not be driving, is not clear that he understood any of the conversation.  I have indicated to the wife that she should keep the car keys away from the patient so that he does not have access to the car.

## 2019-03-15 NOTE — Telephone Encounter (Signed)
Pts wife called stating the the pt is very agitated because he was advised to not drive anymore. Pts wife would like to know if provider can speak to the pt so that he can understand why he was advised this.

## 2019-03-23 ENCOUNTER — Other Ambulatory Visit: Payer: Self-pay | Admitting: Adult Health

## 2019-03-24 NOTE — Telephone Encounter (Signed)
Sent to the pharmacy by e-scribe. 

## 2019-03-30 NOTE — Telephone Encounter (Signed)
I called the patient.  She fell last night and fractured the pubic ramus, she is walking with a walker, but does have some discomfort.  She will be getting some assistance in the home through a home health agency.  She has already had contact with a friend's home [place], she is on the list to get an apartment there.

## 2019-03-30 NOTE — Telephone Encounter (Addendum)
I returned wife Joycelyn Schmid ok per dpr) call. She states she recently fell which resulted in a fracture of her pelvic bone. She states since her fall she is having trouble caring for her husband and is relying on neighbors. I advised home health could be recommended but she states PCP has already requested home health to come out to give assistance and she should be hearing from them the beginning of next week. She states she has been in touch with a company called [employer], but was advised due to covid-19 they are limiting home visit.  She states she wanted Dr. Jannifer Franklin to be notified of these updates and if he had any further recommendations to call at [phone].  I advised since PCP has already requested home health services our recommendation maybe limited and she verbalized appreciation and understanding for the call.

## 2019-03-30 NOTE — Telephone Encounter (Signed)
Pts wife called in and stated she has fallen and fractured her pelvic bone, and she feels as if pt needs to be placed somewhere to where he can get more help

## 2019-04-04 ENCOUNTER — Other Ambulatory Visit: Payer: Self-pay | Admitting: Neurology

## 2019-04-04 MED ORDER — ALPRAZOLAM 0.25 MG PO TABS: 0.2500 mg | ORAL_TABLET | Freq: Three times a day (TID) | ORAL | 0 refills | Status: DC | PRN

## 2019-04-04 NOTE — Telephone Encounter (Signed)
pts wife called in requesting refill for ALPRAZolam (XANAX) 0.25 MG tablet to be sent to [facility] on file

## 2019-04-04 NOTE — Telephone Encounter (Signed)
The alprazolam prescription will be sent in.

## 2019-04-07 NOTE — Telephone Encounter (Signed)
FYI Pt wife called stating that things are getting worse re: pt. She said he was found 1 night at a neighbor's home. Wife states she was unable to get him and wanted to know if she should give pt the Xanax.  The most recent update from Dr Jannifer Franklin on 05-12 was gone over with wife.She was told that Dr Jannifer Franklin entered The alprazolam prescription will be sent in..  Wife said stated since that was so recent she would give the medication to pt and especially since she has help in the home now she feels comfortable giving it to him.  No call back requested.

## 2019-04-09 NOTE — Telephone Encounter (Signed)
Events noted, if further concerns are noted, the wife is to call me back.

## 2019-04-10 ENCOUNTER — Telehealth: Payer: Self-pay | Admitting: Adult Health

## 2019-04-10 NOTE — Telephone Encounter (Signed)
Noted  

## 2019-04-10 NOTE — Telephone Encounter (Signed)
Tried to reach [place] by telephone.  No answer or machine.  Will try again at a later time.  Need to set up telephone call.

## 2019-04-10 NOTE — Telephone Encounter (Signed)
Copied from [place] [id number]. Topic: General - Other >> Apr 10, 2019  8:53 AM Celene Kras A wrote: Reason for CRM: Pts wife called stating she it is getting too expensive for her to continue to pay privately; however, pts wife asks if she can continue to have around the clock care for her husband can she keep him at home instead of sending him into assisted living? She states she has already had hid bed moved downstairs and has plans to move everything else downstairs as well to make it easier for him. Please contact pts wife and advise.

## 2019-04-12 NOTE — Telephone Encounter (Signed)
I would have her check with his insurance to see if there is long-term care coverage.  If not, unfortunately this will be an out-of-pocket expense if she wants to continue to have around-the-clock care

## 2019-04-13 ENCOUNTER — Telehealth: Payer: Self-pay | Admitting: Neurology

## 2019-04-13 NOTE — Telephone Encounter (Signed)
Agree with assessment of the nurse, I did not call the wife back.

## 2019-04-13 NOTE — Telephone Encounter (Signed)
Please refer to nursing note from today's date, a number was given to the wife to initiate potential placement to an assisted living situation for the patient.

## 2019-04-13 NOTE — Telephone Encounter (Signed)
I called the patient, unable to reach her by telephone, I will call back later.

## 2019-04-13 NOTE — Telephone Encounter (Signed)
I tried calling the patient again, no answer.  I will call back later.

## 2019-04-13 NOTE — Telephone Encounter (Signed)
Pt wife has called asking for help on going about finding a nursing facility for pt or the arrangements for pt to have around the clock care at home.  Pt wife states she doesn't know where to start. Please call

## 2019-04-13 NOTE — Telephone Encounter (Signed)
Pt's wife called stating that she is private paying for help with the pt and she is needing assistance because she is at the point now that she is not able to keep paying out of pocket. Also she states that she does not have the technology that is needed for a Virtual Visit for the next scheduled visit. Please advise.

## 2019-04-13 NOTE — Telephone Encounter (Signed)
Tried reaching patient's wife.  No answer or machine.

## 2019-04-13 NOTE — Telephone Encounter (Signed)
Spoke with Jack Noble and advised that she check with her insurance company to see if she has a long term care policy.  She has already checked and has the coverage.  Advised that the next step is to find a facility that she likes and thinks she will be happy staying at.  She is unable to drive currently and [name] is no longer driving.  Advised that she can check with the facility to see if they offer assistance with transportation.  Gave her the office fax number so she can have any necessary paper work sent for [facility] to sign.  She will call back if she has any questions.

## 2019-04-13 NOTE — Telephone Encounter (Signed)
I returned Jack Noble call. She states she is trying to navigate finding placement for her and her husband in an independent living facility or an assisted living.  She confirmed she does have insurance coverage for these facilities, but is unsure of how to start the process.  I provided her with [employer] # and advised this is a free community service that helps with finding placement for patients and significant others. Also advised this service could look at the financial aspect of facility placement.   The numbers I provided were [phone] and [phone].   I advised the wife the care patrol hours may have changed due to covid 19 and if she ran into any issues or had further questions to reach out to our office. She was agreeable with this information and had no further questions at this time.

## 2019-04-14 ENCOUNTER — Ambulatory Visit (INDEPENDENT_AMBULATORY_CARE_PROVIDER_SITE_OTHER): Payer: MEDICARE | Admitting: Family Medicine

## 2019-04-14 ENCOUNTER — Telehealth: Payer: Self-pay | Admitting: *Deleted

## 2019-04-14 ENCOUNTER — Other Ambulatory Visit: Payer: Self-pay

## 2019-04-14 ENCOUNTER — Ambulatory Visit: Payer: Self-pay

## 2019-04-14 DIAGNOSIS — K5900 Constipation, unspecified: Secondary | ICD-10-CM

## 2019-04-14 NOTE — Telephone Encounter (Signed)
Incoming  Call from Pt.  With complaint fo Pt not having a stool since 5 days ago have benn giving him prune juice  And fruits and veggies.  Thinking about Mirlax.  If okay  inquired about water intake.  Pt.  Just sipping onoe bottle . Recommended Oatmeal an increase water. Wife and Care taker voiced understanding   Answer Assessment - Initial Assessment Questions 1. STOOL PATTERN OR FREQUENCY: "How often do you pass bowel movements (BMs)?"  (Normal range: tid to q 3 days)  "When was the last BM passed?"     5 days  2. STRAINING: "Do you have to strain to have a BM?"      no 3. RECTAL PAIN: "Does your rectum hurt when the stool comes out?" If so, ask: "Do you have hemorrhoids? How bad is the pain?"  (Scale 1-10; or mild, moderate, severe)     denies 4. STOOL COMPOSITION: "Are the stools hard?"     tryi to go 5. BLOOD ON STOOLS: "Has there been any blood on the toilet tissue or on the surface of the BM?" If so, ask: "When was the last time?"      Denies  6. CHRONIC CONSTIPATION: "Is this a new problem for you?"  If no, ask: "How long have you had this problem?" (days, weeks, months)       7. CHANGES IN DIET: "Have there been any recent changes in your diet?"      Stopped eating 8. MEDICATIONS: "Have you been taking any new medications?"      9. LAXATIVES: "Have you been using any laxatives or enemas?"  If yes, ask "What, how often, and when was the last time?"     *No Answer* 10. CAUSE: "What do you think is causing the constipation?"        *No Answer* 11. OTHER SYMPTOMS: "Do you have any other symptoms?" (e.g., abdominal pain, fever, vomiting)       12. PREGNANCY: "Is there any chance you are pregnant?" "When was your last menstrual period?"      na  Protocols used: CONSTIPATION-A-AH

## 2019-04-14 NOTE — Telephone Encounter (Signed)
Doxy visit today. Please see message.

## 2019-04-14 NOTE — Telephone Encounter (Signed)
Tried to call pt and no answer.

## 2019-04-14 NOTE — Progress Notes (Signed)
Patient ID: Jack Noble, male   DOB: 10/25/1929, 85 y.o.   MRN: [id number]  This visit type was conducted due to national recommendations for restrictions regarding the COVID-19 pandemic in an effort to limit this patient's exposure and mitigate transmission in our community.   Virtual Visit via Telephone Note  I connected with Jack Noble on 04/14/19 at  2:30 PM EDT by telephone and verified that I am speaking with the correct person using two identifiers.   I discussed the limitations, risks, security and privacy concerns of performing an evaluation and management service by telephone and the availability of in person appointments. I also discussed with the patient that there may be a patient responsible charge related to this service. The patient expressed understanding and agreed to proceed.  Location patient: home Location provider: work or home office Participants present for the call: patient, provider Patient did not have a visit in the prior 7 days to address this/these issue(s).   History of Present Illness: Telephone call with patient's wife.  Patient has some cognitive impairment.  He also has hypertension, GERD, BPH, glaucoma Wife had called earlier today stating that he needed prescription for a walker.  However, he is ambulating fairly well and just use a walker once yesterday to go to the bathroom after sitting a prolonged.Marland Kitchen  Her main concern is that he has had some constipation since last Sunday.  No bowel movement for 5 days.  Past history of constipation.  She is tried some prune juice.  Also tried Colace.  He has MiraLAX on med list but has not tried that yet.  No nausea or vomiting.  No abdominal distention.   Observations/Objective: Patient sounds cheerful and well on the phone. I do not appreciate any SOB. Speech and thought processing are grossly intact. Patient reported vitals:  Assessment and Plan:  Constipation.  Recommended the following  -Increase fluid  intake -May continue Colace -Try MiraLAX which they have at home already -Consider trial of glycerin suppository -Increased ambulation as tolerated -Plenty of natural fiber -May need enema or exam to rule out impaction if symptoms not improving next couple of days  Follow Up Instructions:  As above as needed.   [id number] [id number] [id number] 11-20 [id number] 21-30 I did not refer this patient for an OV in the next 24 hours for this/these issue(s).  I discussed the assessment and treatment plan with the patient. The patient was provided an opportunity to ask questions and all were answered. The patient agreed with the plan and demonstrated an understanding of the instructions.   The patient was advised to call back or seek an in-person evaluation if the symptoms worsen or if the condition fails to improve as anticipated.  I provided 18 minutes of non-face-to-face time during this encounter.   Carolann Littler, MD

## 2019-04-14 NOTE — Telephone Encounter (Signed)
Copied from [place] [id number]. Topic: Quick Communication - Home Health Verbal Orders >> Apr 14, 2019 10:16 AM Erick Blinks wrote: Caller/Agency: [place] Number: [phone] Requesting OT/PT/Skilled Nursing/Social Work/Speech Therapy: Requesting Fl2 paperwork for [facility] senior living on [address]

## 2019-04-17 ENCOUNTER — Emergency Department (HOSPITAL_COMMUNITY): Payer: MEDICARE

## 2019-04-17 ENCOUNTER — Other Ambulatory Visit: Payer: Self-pay

## 2019-04-17 ENCOUNTER — Emergency Department (HOSPITAL_COMMUNITY)
Admission: EM | Admit: 2019-04-17 | Discharge: 2019-04-17 | Disposition: A | Discharge status: Home / Self Care | Payer: MEDICARE | Attending: Emergency Medicine | Admitting: Emergency Medicine

## 2019-04-17 ENCOUNTER — Encounter (HOSPITAL_COMMUNITY): Payer: Self-pay

## 2019-04-17 DIAGNOSIS — Z79899 Other long term (current) drug therapy: Secondary | ICD-10-CM | POA: Insufficient documentation

## 2019-04-17 DIAGNOSIS — F0390 Unspecified dementia without behavioral disturbance: Secondary | ICD-10-CM | POA: Diagnosis not present

## 2019-04-17 DIAGNOSIS — Z85828 Personal history of other malignant neoplasm of skin: Secondary | ICD-10-CM | POA: Diagnosis not present

## 2019-04-17 DIAGNOSIS — Z7982 Long term (current) use of aspirin: Secondary | ICD-10-CM | POA: Insufficient documentation

## 2019-04-17 DIAGNOSIS — R140 Abdominal distension (gaseous): Secondary | ICD-10-CM | POA: Diagnosis not present

## 2019-04-17 DIAGNOSIS — Z8673 Personal history of transient ischemic attack (TIA), and cerebral infarction without residual deficits: Secondary | ICD-10-CM | POA: Insufficient documentation

## 2019-04-17 DIAGNOSIS — I10 Essential (primary) hypertension: Secondary | ICD-10-CM | POA: Insufficient documentation

## 2019-04-17 DIAGNOSIS — K5900 Constipation, unspecified: Secondary | ICD-10-CM | POA: Diagnosis not present

## 2019-04-17 MED ORDER — POLYETHYLENE GLYCOL 3350 17 G PO PACK: 17.0000 g | PACK | Freq: Two times a day (BID) | ORAL | 0 refills | Status: AC

## 2019-04-17 MED ORDER — POLYETHYLENE GLYCOL 3350 17 G PO PACK
17.0000 g | PACK | Freq: Every day | ORAL | Status: DC
  Administered 2019-04-17: 17 g via ORAL
  Filled 2019-04-17: qty 1

## 2019-04-17 NOTE — ED Notes (Signed)
Patient ambulated to bathroom with this nurse. Patient passed gas and had a bowel movement.

## 2019-04-17 NOTE — Discharge Instructions (Signed)
Please use the MiraLAX twice a day until results

## 2019-04-17 NOTE — ED Notes (Signed)
Patient wheeled to front of ED by this RN. Patient discharged and discharge instructions provided to patient wife via telephone. Patient picked up by neighbors and written instructions provided to patient's ride. Patient unable to sign for discharge paperwork. VSS.

## 2019-04-17 NOTE — ED Triage Notes (Addendum)
Patient arrived via [facility] from home.   EMS called out for constipation.  Last BM 5 days ago.  Wife has given patient OTC medications for constipation with no relief.   Patient denies complaints.   Patient was grasping abdomen with EMS.   Abdomen distended.     A/O to name only.   Hx. Dementia   Patient can be combative to wife. Patient follows commands.   No DNR noted by EMS.

## 2019-04-17 NOTE — ED Provider Notes (Signed)
Jack Noble DEPT Provider Note   CSN: [id number] Arrival date & time: 04/17/19  1222    History   Chief Complaint Chief Complaint  Patient presents with  . Constipation   Level 5 caveat: Dementia  HPI Jack Noble is a 85 y.o. male.     HPI 85 year old male brought to the emergency department with complaints of decreased bowel movements over the past 5 days despite over-the-counter medications.  Primary history is obtained from the patient's spouse given patient's advanced dementia.  Patient lives at home with his spouse.  He reports no pain at this time.  Spouse reports no fever vomiting chills decreased bowel movements.  She states he still eating normally.  No other new complaints   Past Medical History:  Diagnosis Date  . Allergy   . Aphasia 12/26/2013   Dr Jannifer Franklin  . Cataracts, bilateral   . Corneal abrasion    Dr. Ned Clines, [facility]  . Dermatophytosis of nail   . Diverticulosis   . Epistaxis   . Glaucoma    Dr. Edilia Bo  . Glaucoma   . Hearing loss   . History of GI diverticular bleed 710/15  . Hyperlipidemia   . Hyperplasia of prostate without urinary obstruction   . Hypertension   . Rosacea    Dr. Marge Duncans  . Sepsis due to Escherichia coli (E. coli) ([place]) 05/2011   Morganella also cultured  . TIA (transient ischemic attack) 9/6-05/2012   BP 221/99    Patient Active Problem List   Diagnosis Date Noted  . Non-recurrent unilateral inguinal hernia without obstruction or gangrene 01/06/2019  . History of recent fall 11/29/2018  . Hernia of abdominal cavity 11/07/2017  . Constipation 11/09/2016  . Transient confusion 09/24/2016  . Ventral hernia without obstruction or gangrene 08/10/2016  . Bilateral inguinal hernia 04/06/2016  . External hemorrhoid 02/25/2016  . Confusion 02/25/2016  . Poor balance 02/05/2016  . Right knee pain 02/05/2016  . Essential hypertension, benign 12/26/2015  . Fatigue 09/19/2015  . GERD  (gastroesophageal reflux disease) 06/06/2015  . Skin cancer 09/14/2014  . History of GI diverticular bleed 06/08/2014  . Aphasia 12/26/2013  . History of short term memory loss 09/01/2013  . Hypoalbuminemia 08/04/2013  . Severe sepsis ([place]) 07/24/2013  . BPH (benign prostatic hyperplasia) 07/24/2013  . Glaucoma   . DIVERTICULOSIS, COLON 07/17/2009  . COLONIC POLYPS, HX OF 07/17/2009  . ALLERGIC RHINITIS 10/16/2008  . LIPOMAS, MULTIPLE 11/04/2007  . Rosacea 05/12/2007    Past Surgical History:  Procedure Laterality Date  . APPENDECTOMY     age 60  . CATARACT EXTRACTION, BILATERAL  2003   Dr Ishmael Holter (now seen @ [facility])  . CHOLECYSTECTOMY  1978   stones  . COLONOSCOPY W/ POLYPECTOMY  06/2009   Dr.Jacobs; "miniscule polyp"  . HERNIA REPAIR    . INGUINAL HERNIA REPAIR     age 17  . SKIN BIOPSY  12/2014   done by dermatologist   . TONSILLECTOMY AND ADENOIDECTOMY    . UPPER GI ENDOSCOPY  05/2014   negative        Home Medications    Prior to Admission medications   Medication Sig Start Date End Date Taking? Authorizing Provider  acetaminophen (TYLENOL) 500 MG tablet Take 500 mg by mouth every 6 (six) hours as needed for mild pain.    [provider]  ALPRAZolam Duanne Moron) 0.25 MG tablet Take 1 tablet (0.25 mg total) by mouth 3 (three) times daily as  needed for anxiety. 04/04/19   Kathrynn Ducking, MD  aspirin EC 81 MG tablet Take 81 mg by mouth daily.     [provider]  Cholecalciferol (VITAMIN D) 1000 UNITS capsule Take 1,000 Units by mouth every morning.     [provider]  Elastic Bandages & Supports (ABDOMINAL BINDER/ELASTIC LARGE) MISC Large or appropriate size Large abdominal hernia 08/10/16   Binnie Rail, MD  escitalopram (LEXAPRO) 5 MG tablet TAKE 1 TABLET BY MOUTH EVERY DAY 10/31/18   Kathrynn Ducking, MD  finasteride (PROSCAR) 5 MG tablet TAKE 1 TABLET BY MOUTH DAILY 03/24/19   Nafziger, Tommi Rumps, NP  hydrocortisone cream 0.5 % Apply 1  application topically daily as needed for itching.    [provider]  losartan (COZAAR) 50 MG tablet TAKE 1 TABLET BY MOUTH EVERY DAY Patient taking differently: Take 50 mg by mouth daily.  10/26/18   Nafziger, Tommi Rumps, NP  nystatin cream (MYCOSTATIN) APPLY TO AFFECTED AREA TWICE A DAY Patient taking differently: Apply 1 application topically 2 (two) times daily as needed for dry skin (rash).  01/10/18   Nafziger, Tommi Rumps, NP  polyethylene glycol (MIRALAX / GLYCOLAX) 17 g packet Take 17 g by mouth 2 (two) times daily for 4 days. 04/17/19 04/21/19  Jola Schmidt, MD  simethicone (MYLICON) 80 MG chewable tablet Chew 1 tablet (80 mg total) by mouth every 6 (six) hours as needed for flatulence. 06/03/14   Kinnie Feil, MD  sucralfate (CARAFATE) 1 GM/10ML suspension Take 10 mLs (1 g total) by mouth 4 (four) times daily -  with meals and at bedtime. Patient taking differently: Take 1 g by mouth 4 (four) times daily as needed (gas).  06/22/18   Varney Biles, MD  timolol (TIMOPTIC) 0.5 % ophthalmic solution Place 1 drop into both eyes 2 (two) times daily.  01/10/18   [provider]  triamcinolone cream (KENALOG) 0.1 % APPLY TO AFFECTED AREA TWICE A DAY 01/10/18   Nafziger, Tommi Rumps, NP    Family History Family History  Problem Relation Age of Onset  . Stroke Mother 28  . Parkinsonism Brother        Brother died at age at age 79  . Pulmonary embolism Father 63       Post Op  . Diabetes Neg Hx   . Cancer Neg Hx   . COPD Neg Hx   . Heart disease Neg Hx     Social History Social History   Tobacco Use  . Smoking status: Never Smoker  . Smokeless tobacco: Never Used  Substance Use Topics  . Alcohol use: No  . Drug use: No     Allergies   Amoxicillin; Codeine; Minocycline hcl; Astelin [azelastine hcl]; Azelastine; Ether; Zocor [simvastatin]; and Metoprolol   Review of Systems Review of Systems  Unable to perform ROS: Dementia     Physical Exam Updated Vital Signs BP  124/85 (BP Location: Right Arm)   Pulse (!) 54   Temp (!) 96.3 F (35.7 C) (Axillary)   Resp 18   Wt 62.8 kg   SpO2 98%   BMI 23.76 kg/m   Physical Exam Vitals signs and nursing note reviewed.  Constitutional:      Appearance: He is well-developed.  HENT:     Head: Normocephalic and atraumatic.  Neck:     Musculoskeletal: Normal range of motion.  Cardiovascular:     Rate and Rhythm: Normal rate and regular rhythm.     Heart sounds: Normal  heart sounds.  Pulmonary:     Effort: Pulmonary effort is normal. No respiratory distress.     Breath sounds: Normal breath sounds.  Abdominal:     General: There is no distension.     Palpations: Abdomen is soft.     Tenderness: There is no abdominal tenderness.  Genitourinary:    Comments: Fout stool.  No gross blood.  No palpable masses on digital rectal exam.  No significant stool burden encountered on rectal examination Musculoskeletal: Normal range of motion.  Skin:    General: Skin is warm and dry.  Neurological:     General: No focal deficit present.     Mental Status: He is alert.  Psychiatric:        Judgment: Judgment normal.      ED Treatments / Results  Labs (all labs ordered are listed, but only abnormal results are displayed) Labs Reviewed - No data to display  EKG None  Radiology Dg Abd 2 Views  Result Date: 04/17/2019 CLINICAL DATA:  Constipation and abdominal distention. EXAM: ABDOMEN - 2 VIEW COMPARISON:  CT scan 11/30/2018 FINDINGS: No gaseous small bowel dilatation to suggest obstruction. Prominent stool volume noted throughout the colon. Surgical clips right upper quadrant suggest prior cholecystectomy. Phleboliths noted over the lower pelvis. Bones diffusely demineralized. IMPRESSION: Prominent stool volume. Correlate for clinical constipation. Electronically Signed   By: Misty Stanley M.D.   On: 04/17/2019 13:12    Procedures Procedures (including critical care time)  Medications Ordered in ED  Medications  polyethylene glycol (MIRALAX / GLYCOLAX) packet 17 g (17 g Oral Given 04/17/19 1339)     Initial Impression / Assessment and Plan / ED Course  I have reviewed the triage vital signs and the nursing notes.  Pertinent labs & imaging results that were available during my care of the patient were reviewed by me and considered in my medical decision making (see chart for details).        Constipation.  Repeat abdominal exam benign.  Patient's spouse updated with findings.  Plan for twice daily MiraLAX until bowel movement.  Primary care follow-up.  No indication for advanced imaging or additional work-up at this time.  Final Clinical Impressions(s) / ED Diagnoses   Final diagnoses:  Abdominal distension  Constipation  Constipation, unspecified constipation type    ED Discharge Orders         Ordered    polyethylene glycol (MIRALAX / GLYCOLAX) 17 g packet  2 times daily     04/17/19 1441           Jola Schmidt, MD 04/17/19 1525

## 2019-04-17 NOTE — ED Notes (Signed)
Patient transported to x-ray. ?

## 2019-04-17 NOTE — ED Notes (Signed)
Bed: TG28 Expected date:  Expected time:  Means of arrival:  Comments: EMS 85yo no BM for 5 days

## 2019-04-18 ENCOUNTER — Telehealth: Payer: Self-pay | Admitting: *Deleted

## 2019-04-18 ENCOUNTER — Telehealth: Payer: Self-pay | Admitting: Neurology

## 2019-04-18 MED ORDER — ALPRAZOLAM 0.25 MG PO TABS: 0.2500 mg | ORAL_TABLET | Freq: Three times a day (TID) | ORAL | 0 refills | Status: DC | PRN

## 2019-04-18 MED ORDER — ESCITALOPRAM OXALATE 10 MG PO TABS: 10.0000 mg | ORAL_TABLET | Freq: Every day | ORAL | 1 refills | Status: DC

## 2019-04-18 NOTE — Telephone Encounter (Signed)
I called the patient.  I talk with the wife.  The patient has had some increased problems with agitation, particularly in the evening hours.  They have been giving him a scheduled dose of alprazolam taking one half of a 0.25 mg tablet twice daily instead of taking it as directed which she is taking a full tablet 3 times a day if needed for agitation.  The patient apparently for some reason has gone off of his Lexapro.  I will send in a prescription for the 10 mg Lexapro, he will take 1 a day.  They are to take the Xanax if needed, he may also take 1 to 2 tablets at night if needed for sleep, he is having trouble sleeping at times.  I have recommended adding a 5 mg melatonin tablet as well at night.

## 2019-04-18 NOTE — Telephone Encounter (Signed)
Pt's wife states that the ALPRAZolam Duanne Moron) 0.25 MG tablet is not working for the pt. Please advise.

## 2019-04-18 NOTE — Telephone Encounter (Signed)
Cory, FL2 is in your red folder.  I have filled out what I can.

## 2019-04-18 NOTE — Telephone Encounter (Signed)
Pt scheduled for a telephone call with [facility] for 04/19/2019 @ 3 PM.

## 2019-04-18 NOTE — Telephone Encounter (Signed)
FL2 received

## 2019-04-18 NOTE — Telephone Encounter (Addendum)
Pt's wife has been notified via phone room staff Dr. Jannifer Franklin is out of the office and will return on 04/19/19.  Wife was advised Dr. Jannifer Franklin would address message on 04/19/19 regarding Xanax.

## 2019-04-18 NOTE — Telephone Encounter (Signed)
Patient's wife called and reports her husband needs an appointment. Per wife patient he is having constipation and has been up all night talking to himself.

## 2019-04-19 ENCOUNTER — Telehealth: Payer: Self-pay | Admitting: Neurology

## 2019-04-19 ENCOUNTER — Encounter: Payer: Self-pay | Admitting: Adult Health

## 2019-04-19 ENCOUNTER — Other Ambulatory Visit: Payer: Self-pay

## 2019-04-19 ENCOUNTER — Ambulatory Visit (INDEPENDENT_AMBULATORY_CARE_PROVIDER_SITE_OTHER): Payer: MEDICARE | Admitting: Adult Health

## 2019-04-19 ENCOUNTER — Ambulatory Visit: Payer: Self-pay

## 2019-04-19 DIAGNOSIS — K5900 Constipation, unspecified: Secondary | ICD-10-CM

## 2019-04-19 NOTE — Telephone Encounter (Signed)
Alana, with [facility], calling to check status of paperwork.

## 2019-04-19 NOTE — Telephone Encounter (Signed)
Returned call to patients wife who call with a question.    What can my husband eat after being constipation and now having loose BM. Pt was seen in ER and disimpacted and was taking miralax. Wife states she has been feeding him a liquid diet to help with constipation. Pt wife was advised that the patient could start to eat his normal diet.  She was advised to be give plenty of liquids as well to replace fluid loss from runny BM's. She was also advised to call back if loose runny stool continues for further evaluation. She will also monitor for returning constipation.Wife states that she stopped the Miralax. Wife verbalized understanding of all instructions. Reason for Disposition . General information question, no triage required and triager able to answer question  Answer Assessment - Initial Assessment Questions 1. REASON FOR CALL or QUESTION: "What is your reason for calling today?" or "How can I best help you?" or "What question do you have that I can help answer?"     What can my husband eat after being constipation and now having loose BM. Pt was seen in ER and disimpacted and was taking miralax.  Protocols used: INFORMATION ONLY CALL-A-AH

## 2019-04-19 NOTE — Telephone Encounter (Signed)
Spoke to Jack Noble and advised that paper work was received.  Informed her that the pt should not be driving and his wife has a broken pelvis and cannot drive.  Unsure how to get them here to the office to do face to face visit.  Alana states a virtual visit will work

## 2019-04-19 NOTE — Progress Notes (Signed)
Virtual Visit via Telephone Note  I connected with Jack Noble on 04/19/19 at  3:00 PM EDT by telephone and verified that I am speaking with the correct person using two identifiers.   I discussed the limitations, risks, security and privacy concerns of performing an evaluation and management service by telephone and the availability of in person appointments. I also discussed with the patient that there may be a patient responsible charge related to this service. The patient expressed understanding and agreed to proceed.  Location patient: home Location provider: work or home office Participants present for the call: provider and wife  Patient did not have a visit in the prior 7 days to address this/these issue(s).   History of Present Illness: 85 year old male who  has a past medical history of Allergy, Aphasia (12/26/2013), Cataracts, bilateral, Corneal abrasion, Dermatophytosis of nail, Diverticulosis, Epistaxis, Glaucoma, Glaucoma, Hearing loss, History of GI diverticular bleed (710/15), Hyperlipidemia, Hyperplasia of prostate without urinary obstruction, Hypertension, Rosacea, Sepsis due to Escherichia coli (E. coli) ([place]) (05/2011), and TIA (transient ischemic attack) (9/6-05/2012).  He was seen in the emergency room two days ago  for constipation over the 5 days prior.  He was using over-the-counter medications without relief.  This time he did not have fever, vomiting, chills, nausea, nominal pain.  X-ray showed prominent stool volume  It was advised that he use her Lasix twice daily until he has a bowel movement.  Today his wife reports that [name] is no longer suffering from constipation.  She reports that she is having a difficult time deciding on which assisted living facility that she and [name] will be going to.  He is concerned because she will have to be quarantined for 14 days after going into an assisted living facility.  He is waiting to hear back from [facility] on whether they have a bed for [name].  Observations/Objective: Patient sounds cheerful and well on the phone. I do not appreciate any SOB. Speech and thought processing are grossly intact. Patient reported vitals:  Assessment and Plan: Continue with MiraLAX daily less diarrhea develops and can cut back to every other day or every 2 days.  Follow Up Instructions: I did not refer this patient for an OV in the next 24 hours for this/these issue(s).  I discussed the assessment and treatment plan with the patient. The patient was provided an opportunity to ask questions and all were answered. The patient agreed with the plan and demonstrated an understanding of the instructions.   The patient was advised to call back or seek an in-person evaluation if the symptoms worsen or if the condition fails to improve as anticipated.  I provided 20 minutes of non-face-to-face time during this encounter.   [name], NP

## 2019-04-19 NOTE — Telephone Encounter (Signed)
Due to current COVID 19 pandemic, our office is severely reducing in office visits until further notice, in order to minimize the risk to our patients and healthcare providers.    I called patient and spoke with wife. She states that she will try to bring patient in the office for his appointment but patient has become slightly combative so it may be difficult to get him here. I explained to her the precautions we are taking and she verbalized understanding. I explained to her that if she is having trouble getting him out of the house for the appointment to call and we can schedule a telephone visit instead. She verbalized understanding.

## 2019-04-20 NOTE — Telephone Encounter (Signed)
Cory to call wife and pt this evening after he has completed his scheduled patients.

## 2019-04-21 ENCOUNTER — Telehealth: Payer: Self-pay

## 2019-04-21 ENCOUNTER — Other Ambulatory Visit: Payer: Self-pay | Admitting: Neurology

## 2019-04-21 NOTE — Telephone Encounter (Signed)
Need to confirm if the [facility] will be going to [facility] or [facility].  Spoke to [place] at [facility] a few minutes ago and she states the pt will be moving in on 04/29/2019.  Tried calling Mrs. Beverley X 3 but no answer or machine.  Will try again at a later time.

## 2019-04-21 NOTE — Telephone Encounter (Signed)
Jack Noble, calling again to check status of fl2 forms. She is requesting these be faxed to [phone] as soon as possible.

## 2019-04-21 NOTE — Telephone Encounter (Signed)
Spoke to Alana to tell her that the pt will be going to [facility].  Alana said she spoke to [place] (family friend) just a few minutes ago and was told the [facility] will be moving in on the 6th.  Tried to call Mrs. Kosek X 3.  No answer or machine available.  Will try again at a later time.

## 2019-04-21 NOTE — Telephone Encounter (Signed)
Copied from [place] [phone]. Topic: General - Other >> Apr 21, 2019 10:33 AM Carolyn Stare wrote: Pt is going to a memory care at [facility] is requesting H&P, Medication list and  FL2   Fax number [phone]

## 2019-04-21 NOTE — Telephone Encounter (Signed)
Patient called for refill of Xanax. When I looked at the meds it appeared it was already ordered 5/26 but it was ordered as "no print". When I went into the record, the script was actually still pended. I'm not sure what happened.. But I approved the pended order and changed for 1 refill for them(it was written as no refills)  and called them back. thanks

## 2019-04-21 NOTE — Telephone Encounter (Signed)
Cecille Rubin, with [facility], calling back regarding form. She is requesting call back.    Phone# [phone] cell # [phone]

## 2019-04-24 ENCOUNTER — Other Ambulatory Visit: Payer: Self-pay

## 2019-04-24 ENCOUNTER — Telehealth: Payer: Self-pay | Admitting: *Deleted

## 2019-04-24 ENCOUNTER — Ambulatory Visit (INDEPENDENT_AMBULATORY_CARE_PROVIDER_SITE_OTHER): Payer: MEDICARE | Admitting: Neurology

## 2019-04-24 ENCOUNTER — Encounter: Payer: Self-pay | Admitting: Neurology

## 2019-04-24 DIAGNOSIS — R410 Disorientation, unspecified: Secondary | ICD-10-CM

## 2019-04-24 MED ORDER — MIRTAZAPINE 15 MG PO TABS: 15.0000 mg | ORAL_TABLET | Freq: Every day | ORAL | 3 refills | Status: DC

## 2019-04-24 NOTE — Progress Notes (Signed)
     Virtual Visit via Telephone Note  I connected with Jack Noble on 04/24/19 at  2:30 PM EDT by telephone and verified that I am speaking with the correct person using two identifiers.  Location: Patient: The patient is at home. Provider: The physician is in the office   I discussed the limitations, risks, security and privacy concerns of performing an evaluation and management service by telephone and the availability of in person appointments. I also discussed with the patient that there may be a patient responsible charge related to this service. The patient expressed understanding and agreed to proceed.   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  The patient has likely become somewhat more demented over time, he is having some agitation in the evening hours.  His wife has fallen and sustained a pelvic fracture and they now require aid and assistance at home.  The wife is considering a transition to the [place] home, she is concerned about this transition.  The patient is requiring alprazolam 0.25 mg 3 times a day if needed, he is on Lexapro 10 mg daily.  The patient sleeps well once he does get to bed.   Observations/Objective: The telephone interview was done through the wife, the patient is aphasic.  Assessment and Plan: 1.  Aphasia associated with prior encephalitis  2.  Probable dementia  The patient is not sleeping well at night, we will switch off of the Lexapro and use mirtazapine at night for sleep.  I have called in a prescription for the 15 mg tablet, he can still take alprazolam if needed for agitation.  He will follow-up in 6 months or as needed.  Follow Up Instructions: 77-month follow-up, may see nurse practitioner.   I discussed the assessment and treatment plan with the patient. The patient was provided an opportunity to ask questions and all were answered. The  patient agreed with the plan and demonstrated an understanding of the instructions.   The patient was advised to call back or seek an in-person evaluation if the symptoms worsen or if the condition fails to improve as anticipated.  I provided 22 minutes of non-face-to-face time during this encounter.   Kathrynn Ducking, MD

## 2019-04-24 NOTE — Telephone Encounter (Signed)
Copied from [place] [id number]. Topic: General - Other >> Apr 24, 2019 10:17 AM Celene Kras A wrote: Reason for CRM: Alana, from [place], called requesting Fl2 and addendum for [place] and Pasha Broad be faxed over. She states she gave an incorrect fax #. New fax # [phone]

## 2019-04-24 NOTE — Telephone Encounter (Signed)
pts wife returned call and provided consent to convert to telephone visit due to patients health , she also provides consent to bill insurance  CB# [phone]

## 2019-04-24 NOTE — Telephone Encounter (Signed)
Noted  

## 2019-04-25 ENCOUNTER — Telehealth: Payer: Self-pay | Admitting: *Deleted

## 2019-04-25 ENCOUNTER — Telehealth: Payer: Self-pay | Admitting: Neurology

## 2019-04-25 NOTE — Telephone Encounter (Signed)
Spoke to Jack Noble.  She has not made up her mind what facility she wants to move to.  She stated she will call me when she knows for sure.  I notified her that she will need to call the facility that she will not be moving to and let them know she has chose another facility.  Nothing further needed at this time.

## 2019-04-25 NOTE — Telephone Encounter (Signed)
Spoke to Mrs. Jack Noble.  She has not made up her mind what facility she wants to move to.  She stated she will call me when she knows for sure.  I notified her that she will need to call the facility that she will not be moving to and let them know she has chose another facility.  Nothing further needed at this time.

## 2019-04-25 NOTE — Telephone Encounter (Signed)
Pt's wife called in and is wanting the RN or provider to call her back to go over how to give the pts medications. Please advise.

## 2019-04-25 NOTE — Telephone Encounter (Signed)
Spoke to [employer] and informed her that the pt's wife has not yet made a decision on which facility they will be staying in.  Mrs. Drew will contact me when she has made a decision.  Nothing further needed at this time.

## 2019-04-25 NOTE — Telephone Encounter (Signed)
Copied from [place] [id number]. Topic: General - Other >> Apr 24, 2019 10:17 AM Celene Kras A wrote: Reason for CRM: Alana, from [place], called requesting Fl2 and addendum for [place] and Treveon Bourcier be faxed over. She states she gave an incorrect fax #. New fax # [phone] >> Apr 25, 2019  1:08 PM Yvette Rack wrote: Alana with [employer] called for an update on the [facility] forms for both Barnabas Lister & Devin Ganaway. Alana requests a call back at [phone]

## 2019-04-25 NOTE — Telephone Encounter (Signed)
I contacted the pt's wife Joycelyn Schmid ok per dpr). She needed to confirm the medication change from 04/24/19. I advised the pt's wife Dr. Jannifer Franklin has d/c the Escitalopram and rx'd mirtazapine 15 mg 1 tab at bedtime.  Pt's wife reports she has not picked the medication up yet but will soon. Pt's wife asked me to review the change with the pt's home health aid Horris Latino. Wife verbally agreed that I could speak with Horris Latino and advised of change. She verbalized understanding and will update her Supervising RN of this information as well.

## 2019-04-26 ENCOUNTER — Telehealth: Payer: Self-pay

## 2019-04-26 NOTE — Telephone Encounter (Signed)
Pt scheduled for 6 month f/u with SS, NP on 10/31/19 at 145 pm check in time of 1:15 pm.

## 2019-04-27 MED ORDER — ESCITALOPRAM OXALATE 10 MG PO TABS: 10.0000 mg | ORAL_TABLET | Freq: Every day | ORAL | 1 refills | Status: AC

## 2019-04-27 NOTE — Telephone Encounter (Signed)
Pts wife called in and stated that the mirtazapine (REMERON) 15 MG tablet isnt working properly and he is having a lot of side effects , she is wanting to know f he can start the Lexapro back and d/c the mirtazapine (REMERON) 15 MG tablet

## 2019-04-27 NOTE — Addendum Note (Signed)
Addended by: Kathrynn Ducking on: 04/27/2019 06:32 PM   Modules accepted: Orders

## 2019-04-27 NOTE — Telephone Encounter (Signed)
I called and left a message, okay to stop the Remeron and switch back to Lexapro.  They are to contact me if any further medication adjustments are needed.

## 2019-04-28 ENCOUNTER — Telehealth: Payer: Self-pay

## 2019-04-28 ENCOUNTER — Encounter: Payer: Self-pay | Admitting: Adult Health

## 2019-04-28 ENCOUNTER — Other Ambulatory Visit: Payer: Self-pay

## 2019-04-28 ENCOUNTER — Telehealth: Payer: Self-pay | Admitting: Hematology

## 2019-04-28 ENCOUNTER — Ambulatory Visit (INDEPENDENT_AMBULATORY_CARE_PROVIDER_SITE_OTHER): Payer: MEDICARE | Admitting: Adult Health

## 2019-04-28 DIAGNOSIS — Z022 Encounter for examination for admission to residential institution: Secondary | ICD-10-CM

## 2019-04-28 DIAGNOSIS — Z20822 Contact with and (suspected) exposure to covid-19: Secondary | ICD-10-CM

## 2019-04-28 DIAGNOSIS — Z7189 Other specified counseling: Secondary | ICD-10-CM | POA: Diagnosis not present

## 2019-04-28 NOTE — Progress Notes (Signed)
Virtual Visit via Telephone Note  I connected with Jack Noble on 04/28/19 at  3:30 PM EDT by telephone and verified that I am speaking with the correct person using two identifiers.   I discussed the limitations, risks, security and privacy concerns of performing an evaluation and management service by telephone and the availability of in person appointments. I also discussed with the patient that there may be a patient responsible charge related to this service. The patient expressed understanding and agreed to proceed.  Location patient: home Location provider: work or home office Participants present for the call: provider and wife  Patient did not have a visit in the prior 7 days to address this/these issue(s).   History of Present Illness: Patient has been accepted to [place] facility but needs Covid testing prior to moving in. His wife reports no symptoms    Observations/Objective: Patient sounds cheerful and well on the phone. I do not appreciate any SOB. Speech and thought processing are grossly intact. Patient reported vitals:  Assessment and Plan: 1. Advice Given About Covid-19 Virus by Telephone - Will refer to testing at [facility]    Follow Up Instructions:  I did not refer this patient for an OV in the next 24 hours for this/these issue(s).  I discussed the assessment and treatment plan with the patient. The patient was provided an opportunity to ask questions and all were answered. The patient agreed with the plan and demonstrated an understanding of the instructions.   The patient was advised to call back or seek an in-person evaluation if the symptoms worsen or if the condition fails to improve as anticipated.  I provided 15 minutes of non-face-to-face time during this encounter.   [name], NP

## 2019-04-28 NOTE — Telephone Encounter (Signed)
PCP has requested COVID screening / Explained to wife how test is performed being a nasal swab just inside the nose. Explained she doesn't have to get out the car, it is drive through / Patient is needing testing for nursing home placement /

## 2019-04-28 NOTE — Telephone Encounter (Signed)
I spoke with patient's wife and she wanted to schedule a visit due patient going into a care facility.  Mrs. Gluth explained that she wanted to speak with patient's PCP before patient is to go.  She said that the facility would need a Covid 19 testing for patient before hand.  Patient is on schedule today for a televisit.

## 2019-05-01 ENCOUNTER — Encounter: Payer: Self-pay | Admitting: Adult Health

## 2019-05-01 ENCOUNTER — Other Ambulatory Visit: Payer: MEDICARE

## 2019-05-01 DIAGNOSIS — R6889 Other general symptoms and signs: Secondary | ICD-10-CM | POA: Diagnosis not present

## 2019-05-01 DIAGNOSIS — Z20822 Contact with and (suspected) exposure to covid-19: Secondary | ICD-10-CM

## 2019-05-01 NOTE — Telephone Encounter (Signed)
This encounter was created in error - please disregard.

## 2019-05-02 ENCOUNTER — Telehealth: Payer: Self-pay | Admitting: Family Medicine

## 2019-05-02 LAB — NOVEL CORONAVIRUS, NAA: SARS-CoV-2, NAA: NOT DETECTED

## 2019-05-02 NOTE — Telephone Encounter (Signed)
Copied from [place] [id number]. Topic: General - Other >> Apr 27, 2019  5:01 PM Ivar Drape wrote: Reason for CRM:   Patient's spouse stated that the patient will be going into a nursing home facility and a COVID-19 test is required to be accepted.  So the patient needs testing done.  Please advise.

## 2019-05-02 NOTE — Telephone Encounter (Signed)
Spoke to Jack Noble and advised that she will be contacted by the [facility] nursing staff.  Order was placed on Friday.  Jack Noble states they were contacted and had testing done on 04/30/2019.  Nothing further needed at this time.

## 2019-05-02 NOTE — Telephone Encounter (Signed)
Order was placed on Friday

## 2019-05-03 ENCOUNTER — Telehealth: Payer: Self-pay

## 2019-05-03 NOTE — Telephone Encounter (Signed)
Covid-19 result read to patient wife. Pt not infected with the novel coronavirus.  Wife verbalized understanding. Per wife patient has no symptoms. Pt had testing for admission  to a care facility .

## 2019-05-04 ENCOUNTER — Ambulatory Visit: Payer: Self-pay

## 2019-05-09 DIAGNOSIS — M533 Sacrococcygeal disorders, not elsewhere classified: Secondary | ICD-10-CM | POA: Diagnosis not present

## 2019-05-09 DIAGNOSIS — R102 Pelvic and perineal pain: Secondary | ICD-10-CM | POA: Diagnosis not present

## 2019-05-19 DIAGNOSIS — E039 Hypothyroidism, unspecified: Secondary | ICD-10-CM | POA: Diagnosis not present

## 2019-05-19 DIAGNOSIS — D649 Anemia, unspecified: Secondary | ICD-10-CM | POA: Diagnosis not present

## 2019-05-19 DIAGNOSIS — E785 Hyperlipidemia, unspecified: Secondary | ICD-10-CM | POA: Diagnosis not present

## 2019-05-19 DIAGNOSIS — I10 Essential (primary) hypertension: Secondary | ICD-10-CM | POA: Diagnosis not present

## 2019-05-19 DIAGNOSIS — E559 Vitamin D deficiency, unspecified: Secondary | ICD-10-CM | POA: Diagnosis not present

## 2019-05-19 DIAGNOSIS — Z79899 Other long term (current) drug therapy: Secondary | ICD-10-CM | POA: Diagnosis not present

## 2019-05-25 DIAGNOSIS — F0391 Unspecified dementia with behavioral disturbance: Secondary | ICD-10-CM | POA: Diagnosis not present

## 2019-05-25 DIAGNOSIS — M6281 Muscle weakness (generalized): Secondary | ICD-10-CM | POA: Diagnosis not present

## 2019-05-25 DIAGNOSIS — Z9181 History of falling: Secondary | ICD-10-CM | POA: Diagnosis not present

## 2019-05-25 DIAGNOSIS — K219 Gastro-esophageal reflux disease without esophagitis: Secondary | ICD-10-CM | POA: Diagnosis not present

## 2019-05-25 DIAGNOSIS — R1312 Dysphagia, oropharyngeal phase: Secondary | ICD-10-CM | POA: Diagnosis not present

## 2019-05-29 DIAGNOSIS — F0391 Unspecified dementia with behavioral disturbance: Secondary | ICD-10-CM | POA: Diagnosis not present

## 2019-05-29 DIAGNOSIS — R1312 Dysphagia, oropharyngeal phase: Secondary | ICD-10-CM | POA: Diagnosis not present

## 2019-05-29 DIAGNOSIS — K219 Gastro-esophageal reflux disease without esophagitis: Secondary | ICD-10-CM | POA: Diagnosis not present

## 2019-05-29 DIAGNOSIS — M6281 Muscle weakness (generalized): Secondary | ICD-10-CM | POA: Diagnosis not present

## 2019-05-29 DIAGNOSIS — Z9181 History of falling: Secondary | ICD-10-CM | POA: Diagnosis not present

## 2019-05-30 DIAGNOSIS — K219 Gastro-esophageal reflux disease without esophagitis: Secondary | ICD-10-CM | POA: Diagnosis not present

## 2019-05-30 DIAGNOSIS — M6281 Muscle weakness (generalized): Secondary | ICD-10-CM | POA: Diagnosis not present

## 2019-05-30 DIAGNOSIS — R1312 Dysphagia, oropharyngeal phase: Secondary | ICD-10-CM | POA: Diagnosis not present

## 2019-05-30 DIAGNOSIS — Z9181 History of falling: Secondary | ICD-10-CM | POA: Diagnosis not present

## 2019-05-30 DIAGNOSIS — F0391 Unspecified dementia with behavioral disturbance: Secondary | ICD-10-CM | POA: Diagnosis not present

## 2019-05-31 DIAGNOSIS — Z9181 History of falling: Secondary | ICD-10-CM | POA: Diagnosis not present

## 2019-05-31 DIAGNOSIS — F0391 Unspecified dementia with behavioral disturbance: Secondary | ICD-10-CM | POA: Diagnosis not present

## 2019-05-31 DIAGNOSIS — R1312 Dysphagia, oropharyngeal phase: Secondary | ICD-10-CM | POA: Diagnosis not present

## 2019-05-31 DIAGNOSIS — K219 Gastro-esophageal reflux disease without esophagitis: Secondary | ICD-10-CM | POA: Diagnosis not present

## 2019-05-31 DIAGNOSIS — M6281 Muscle weakness (generalized): Secondary | ICD-10-CM | POA: Diagnosis not present

## 2019-06-01 DIAGNOSIS — R1312 Dysphagia, oropharyngeal phase: Secondary | ICD-10-CM | POA: Diagnosis not present

## 2019-06-01 DIAGNOSIS — Z9181 History of falling: Secondary | ICD-10-CM | POA: Diagnosis not present

## 2019-06-01 DIAGNOSIS — F0391 Unspecified dementia with behavioral disturbance: Secondary | ICD-10-CM | POA: Diagnosis not present

## 2019-06-01 DIAGNOSIS — K219 Gastro-esophageal reflux disease without esophagitis: Secondary | ICD-10-CM | POA: Diagnosis not present

## 2019-06-01 DIAGNOSIS — M6281 Muscle weakness (generalized): Secondary | ICD-10-CM | POA: Diagnosis not present

## 2019-06-02 DIAGNOSIS — M6281 Muscle weakness (generalized): Secondary | ICD-10-CM | POA: Diagnosis not present

## 2019-06-02 DIAGNOSIS — R1312 Dysphagia, oropharyngeal phase: Secondary | ICD-10-CM | POA: Diagnosis not present

## 2019-06-02 DIAGNOSIS — Z9181 History of falling: Secondary | ICD-10-CM | POA: Diagnosis not present

## 2019-06-02 DIAGNOSIS — F0391 Unspecified dementia with behavioral disturbance: Secondary | ICD-10-CM | POA: Diagnosis not present

## 2019-06-02 DIAGNOSIS — K219 Gastro-esophageal reflux disease without esophagitis: Secondary | ICD-10-CM | POA: Diagnosis not present

## 2019-06-05 DIAGNOSIS — K219 Gastro-esophageal reflux disease without esophagitis: Secondary | ICD-10-CM | POA: Diagnosis not present

## 2019-06-05 DIAGNOSIS — R1312 Dysphagia, oropharyngeal phase: Secondary | ICD-10-CM | POA: Diagnosis not present

## 2019-06-05 DIAGNOSIS — M6281 Muscle weakness (generalized): Secondary | ICD-10-CM | POA: Diagnosis not present

## 2019-06-05 DIAGNOSIS — Z9181 History of falling: Secondary | ICD-10-CM | POA: Diagnosis not present

## 2019-06-05 DIAGNOSIS — F0391 Unspecified dementia with behavioral disturbance: Secondary | ICD-10-CM | POA: Diagnosis not present

## 2019-06-07 DIAGNOSIS — Z9181 History of falling: Secondary | ICD-10-CM | POA: Diagnosis not present

## 2019-06-07 DIAGNOSIS — F062 Psychotic disorder with delusions due to known physiological condition: Secondary | ICD-10-CM | POA: Diagnosis not present

## 2019-06-07 DIAGNOSIS — F0391 Unspecified dementia with behavioral disturbance: Secondary | ICD-10-CM | POA: Diagnosis not present

## 2019-06-07 DIAGNOSIS — M6281 Muscle weakness (generalized): Secondary | ICD-10-CM | POA: Diagnosis not present

## 2019-06-07 DIAGNOSIS — F331 Major depressive disorder, recurrent, moderate: Secondary | ICD-10-CM | POA: Diagnosis not present

## 2019-06-07 DIAGNOSIS — F064 Anxiety disorder due to known physiological condition: Secondary | ICD-10-CM | POA: Diagnosis not present

## 2019-06-07 DIAGNOSIS — R1312 Dysphagia, oropharyngeal phase: Secondary | ICD-10-CM | POA: Diagnosis not present

## 2019-06-07 DIAGNOSIS — F0151 Vascular dementia with behavioral disturbance: Secondary | ICD-10-CM | POA: Diagnosis not present

## 2019-06-07 DIAGNOSIS — K219 Gastro-esophageal reflux disease without esophagitis: Secondary | ICD-10-CM | POA: Diagnosis not present

## 2019-06-09 DIAGNOSIS — K219 Gastro-esophageal reflux disease without esophagitis: Secondary | ICD-10-CM | POA: Diagnosis not present

## 2019-06-09 DIAGNOSIS — M6281 Muscle weakness (generalized): Secondary | ICD-10-CM | POA: Diagnosis not present

## 2019-06-09 DIAGNOSIS — R1312 Dysphagia, oropharyngeal phase: Secondary | ICD-10-CM | POA: Diagnosis not present

## 2019-06-09 DIAGNOSIS — F0391 Unspecified dementia with behavioral disturbance: Secondary | ICD-10-CM | POA: Diagnosis not present

## 2019-06-09 DIAGNOSIS — Z9181 History of falling: Secondary | ICD-10-CM | POA: Diagnosis not present

## 2019-06-24 DEATH — deceased

## 2019-10-31 ENCOUNTER — Ambulatory Visit: Payer: Self-pay | Admitting: Neurology
